# Patient Record
Sex: Male | Born: 1955 | Race: White | Hispanic: No | Marital: Married | Smoking: Current some day smoker
Health system: Southern US, Community
[De-identification: ages and names within clinical notes are randomized; demographics above are authoritative.]

## PROBLEM LIST (undated history)

## (undated) DIAGNOSIS — K759 Inflammatory liver disease, unspecified: Secondary | ICD-10-CM

## (undated) DIAGNOSIS — I10 Essential (primary) hypertension: Secondary | ICD-10-CM

## (undated) DIAGNOSIS — M1990 Unspecified osteoarthritis, unspecified site: Secondary | ICD-10-CM

## (undated) DIAGNOSIS — Z974 Presence of external hearing-aid: Secondary | ICD-10-CM

## (undated) HISTORY — PX: JOINT REPLACEMENT: SHX530

## (undated) HISTORY — PX: BRAIN SURGERY: SHX531

---
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
## 2012-06-16 DIAGNOSIS — IMO0002 Reserved for concepts with insufficient information to code with codable children: Secondary | ICD-10-CM | POA: Insufficient documentation

## 2012-06-16 DIAGNOSIS — H40003 Preglaucoma, unspecified, bilateral: Secondary | ICD-10-CM | POA: Insufficient documentation

## 2013-04-19 DIAGNOSIS — M1990 Unspecified osteoarthritis, unspecified site: Secondary | ICD-10-CM | POA: Insufficient documentation

## 2015-02-20 DIAGNOSIS — I609 Nontraumatic subarachnoid hemorrhage, unspecified: Secondary | ICD-10-CM

## 2015-02-20 HISTORY — DX: Nontraumatic subarachnoid hemorrhage, unspecified: I60.9

## 2017-05-22 ENCOUNTER — Encounter: Payer: Self-pay | Admitting: Family Medicine

## 2017-05-23 ENCOUNTER — Encounter: Payer: Self-pay | Admitting: Family Medicine

## 2017-05-23 ENCOUNTER — Ambulatory Visit (INDEPENDENT_AMBULATORY_CARE_PROVIDER_SITE_OTHER): Payer: BLUE CROSS/BLUE SHIELD | Admitting: Family Medicine

## 2017-05-23 ENCOUNTER — Ambulatory Visit (INDEPENDENT_AMBULATORY_CARE_PROVIDER_SITE_OTHER)
Admission: RE | Admit: 2017-05-23 | Discharge: 2017-05-23 | Disposition: A | Discharge status: Home / Self Care | Payer: BLUE CROSS/BLUE SHIELD | Source: Ambulatory Visit | Attending: Family Medicine | Admitting: Family Medicine

## 2017-05-23 VITALS — BP 140/80 | HR 68 | Ht 72.0 in | Wt 216.0 lb

## 2017-05-23 DIAGNOSIS — M545 Low back pain, unspecified: Secondary | ICD-10-CM

## 2017-05-23 DIAGNOSIS — M5441 Lumbago with sciatica, right side: Secondary | ICD-10-CM | POA: Diagnosis not present

## 2017-05-23 DIAGNOSIS — M5442 Lumbago with sciatica, left side: Secondary | ICD-10-CM

## 2017-05-23 DIAGNOSIS — G8929 Other chronic pain: Secondary | ICD-10-CM | POA: Diagnosis not present

## 2017-05-23 MED ORDER — GABAPENTIN 100 MG PO CAPS: 200.0000 mg | ORAL_CAPSULE | Freq: Every day | ORAL | 3 refills | Status: DC

## 2017-05-23 MED ORDER — VITAMIN D (ERGOCALCIFEROL) 1.25 MG (50000 UNIT) PO CAPS: 50000.0000 [IU] | ORAL_CAPSULE | ORAL | 0 refills | Status: DC

## 2017-05-23 NOTE — Patient Instructions (Addendum)
Good to see you  Ice is your friend Ice 20 minutes 2 times daily. Usually after activity and before bed. Exercises 3 times a week.  Gabapentin 200mg  at night  Once weekly vitamin D for 12 weeks  xrays downstairs Over the counter get  Turmeric 500mg  daily  Tart cherry extract any dose at night  See me again in 3-4 weeks

## 2017-05-23 NOTE — Progress Notes (Signed)
Tawana ScaleZach Mooney D.O. [facility] [address] [place], [place] [address] Phone: [phone] Subjective:    I'm seeing this patient by the request  of:    CC: Back pain  CZY:[id number]:Subjective  Charles Mooney is a 62 y.o. male coming in with complaint of back pain. Lower left and upper right are most painful. Lateral left leg has some numbness, tingling, and burning sensation.  Onset- Chronic  Location lower back Duration- Worse at night but is now consistent all day Character-more of an aching sensation Aggravating factors- Standing Reliving factors- Heat, Ice, stretches  Therapies tried-as stated above Severity-5 out of 10 but potentially worsening over the course of time     History reviewed. No pertinent past medical history. History reviewed. No pertinent surgical history. Social History   Socioeconomic History  . Marital status: Married    Spouse name: Not on file  . Number of children: Not on file  . Years of education: Not on file  . Highest education level: Not on file  Occupational History  . Not on file  Social Needs  . Financial resource strain: Not on file  . Food insecurity:    Worry: Not on file    Inability: Not on file  . Transportation needs:    Medical: Not on file    Non-medical: Not on file  Tobacco Use  . Smoking status: Former Smoker    Last attempt to quit: 02/06/2016    Years since quitting: 1.2  . Smokeless tobacco: Never Used  Substance and Sexual Activity  . Alcohol use: Not on file  . Drug use: Not on file  . Sexual activity: Not on file  Lifestyle  . Physical activity:    Days per week: Not on file    Minutes per session: Not on file  . Stress: Not on file  Relationships  . Social connections:    Talks on phone: Not on file    Gets together: Not on file    Attends religious service: Not on file    Active member of club or organization: Not on file    Attends meetings of clubs or organizations: Not on file    Relationship  status: Not on file  Other Topics Concern  . Not on file  Social History Narrative  . Not on file   Not on File History reviewed. No pertinent family history.   Past medical history, social, surgical and family history all reviewed in electronic medical record.  No pertanent information unless stated regarding to the chief complaint.   Review of Systems:Review of systems updated and as accurate as of 05/23/17  No headache, visual changes, nausea, vomiting, diarrhea, constipation, dizziness, abdominal pain, skin rash, fevers, chills, night sweats, weight loss, swollen lymph nodes, body aches, joint swelling, muscle aches, chest pain, shortness of breath, mood changes.   Objective  Blood pressure 140/80, pulse 68, height 6' (1.829 m), weight 216 lb (98 kg), SpO2 97 %. Systems examined below as of 05/23/17   General: No apparent distress alert and oriented x3 mood and affect normal, dressed appropriately.  HEENT: Pupils equal, extraocular movements intact  Respiratory: Patient's speak in full sentences and does not appear short of breath  Cardiovascular: No lower extremity edema, non tender, no erythema  Skin: Warm dry intact with no signs of infection or rash on extremities or on axial skeleton.  Abdomen: Soft nontender  Neuro: Cranial nerves II through XII are intact, neurovascularly intact in all extremities  with 2+ DTRs and 2+ pulses.  Lymph: No lymphadenopathy of posterior or anterior cervical chain or axillae bilaterally.  Gait normal with good balance and coordination.  MSK:  Non tender with full range of motion and good stability and symmetric strength and tone of shoulders, elbows, wrist, hip, knee and ankles bilaterally.  Back Exam:  Inspection: Loss of lordosis Motion: Flexion 45 deg, Extension 25 deg, Side Bending to 25 deg bilaterally,  Rotation to 25 deg bilaterally  SLR laying: Negative  XSLR laying: Negative  Palpable tenderness: Diffuse tenderness in the paraspinal  musculature lumbar spine right greater than left. FABER: Significant tightness bilaterally.. Sensory change: Gross sensation intact to all lumbar and sacral dermatomes.  Reflexes: 2+ at both patellar tendons, 2+ at achilles tendons, Babinski's downgoing.  Strength at foot  Plantar-flexion: 5/5 Dorsi-flexion: 5/5 Eversion: 5/5 Inversion: 5/5  Leg strength  Quad: 5/5 Hamstring: 5/5 Hip flexor: 5/5 Hip abductors: 5/5  Gait unremarkable.  97110; 15 additional minutes spent for Therapeutic exercises as stated in above notes.  This included exercises focusing on stretching, strengthening, with significant focus on eccentric aspects.   Long term goals include an improvement in range of motion, strength, endurance as well as avoiding reinjury. Patient's frequency would include in 1-2 times a day, 3-5 times a week for a duration of 6-12 weeks. Low back exercises that included:  Pelvic tilt/bracing instruction to focus on control of the pelvic girdle and lower abdominal muscles  Glute strengthening exercises, focusing on proper firing of the glutes without engaging the low back muscles Proper stretching techniques for maximum relief for the hamstrings, hip flexors, low back and some rotation where tolerated    Proper technique shown and discussed handout in great detail with ATC.  All questions were discussed and answered.       Impression and Recommendations:     This case required medical decision making of moderate complexity.      Note: This dictation was prepared with Dragon dictation along with smaller phrase technology. Any transcriptional errors that result from this process are unintentional.

## 2017-05-23 NOTE — Assessment & Plan Note (Signed)
Patient does have some low back pain.  Sometimes has some radicular pain that is also consistent with some weakness.  X-rays ordered today.  Gabapentin given.  Concern for potential spinal stenosis.  Has seen other providers previously for this.  Also started once weekly vitamin D.  Continues to have pain consider formal physical therapy, manipulation, or possible labs.  Patient will follow-up again in 4 weeks

## 2017-06-14 ENCOUNTER — Ambulatory Visit: Payer: Self-pay

## 2017-06-14 ENCOUNTER — Ambulatory Visit (INDEPENDENT_AMBULATORY_CARE_PROVIDER_SITE_OTHER): Payer: BLUE CROSS/BLUE SHIELD | Admitting: Family Medicine

## 2017-06-14 ENCOUNTER — Encounter: Payer: Self-pay | Admitting: Family Medicine

## 2017-06-14 VITALS — BP 126/90 | HR 58 | Ht 72.0 in | Wt 214.0 lb

## 2017-06-14 DIAGNOSIS — M999 Biomechanical lesion, unspecified: Secondary | ICD-10-CM | POA: Insufficient documentation

## 2017-06-14 DIAGNOSIS — M5441 Lumbago with sciatica, right side: Secondary | ICD-10-CM | POA: Diagnosis not present

## 2017-06-14 DIAGNOSIS — M7551 Bursitis of right shoulder: Secondary | ICD-10-CM | POA: Diagnosis not present

## 2017-06-14 DIAGNOSIS — M25511 Pain in right shoulder: Secondary | ICD-10-CM

## 2017-06-14 DIAGNOSIS — M5442 Lumbago with sciatica, left side: Secondary | ICD-10-CM

## 2017-06-14 DIAGNOSIS — G8929 Other chronic pain: Secondary | ICD-10-CM

## 2017-06-14 NOTE — Assessment & Plan Note (Signed)
Stable.  Mild osteoarthritic changes.  Discussed with patient at great length about icing regimen and home exercises.  Patient will try to increase activity slowly over the course the next several days.  Patient will follow-up with me again in 4 to 6 weeks for further evaluation and treatment.

## 2017-06-14 NOTE — Patient Instructions (Addendum)
Good to see you  Ice 20 minutes 2 times daily. Usually after activity and before bed. Exercises 3 times a week.  pennsaid pinkie amount topically 2 times daily as needed.  Keep hands within peripheral vision  The back has mild arthritis but does seem to be making progress.  See me again in 4 weeks

## 2017-06-14 NOTE — Progress Notes (Signed)
[facility] [address] [place], [place] [address] Phone: [phone] Subjective:    I'm seeing this patient by the request  of:    CC: Back pain and shoulder pain follow-up  BJY:[id number]  Charles Mooney is a 62 y.o. male coming in with complaint of  Back pain.  Patient did have x-rays.  These were independently visualized by me.  Patient was found to have very mild loss arthritic changes.  Continues to have discomfort and pain now.  States that the exercises have been helpful.  Feels that if somebody could just potentially aligned him it seems to be doing better.  Patient is continuing to work very hard.  Patient is in the process of building a house.  Patient also has a new problem.  Right shoulder pain.  Describes the pain as a dull, throbbing aching pain.  Seems to radiate down his arm some.  Denies any weakness though.  Does wake him up at night.  Rates the severity of pain is 7 out of 10     No past medical history on file. No past surgical history on file. Social History   Socioeconomic History  . Marital status: Married    Spouse name: Not on file  . Number of children: Not on file  . Years of education: Not on file  . Highest education level: Not on file  Occupational History  . Not on file  Social Needs  . Financial resource strain: Not on file  . Food insecurity:    Worry: Not on file    Inability: Not on file  . Transportation needs:    Medical: Not on file    Non-medical: Not on file  Tobacco Use  . Smoking status: Former Smoker    Last attempt to quit: 02/06/2016    Years since quitting: 1.3  . Smokeless tobacco: Never Used  Substance and Sexual Activity  . Alcohol use: Not on file  . Drug use: Not on file  . Sexual activity: Not on file  Lifestyle  . Physical activity:    Days per week: Not on file    Minutes per session: Not on file  . Stress: Not on file  Relationships  . Social connections:    Talks on phone:  Not on file    Gets together: Not on file    Attends religious service: Not on file    Active member of club or organization: Not on file    Attends meetings of clubs or organizations: Not on file    Relationship status: Not on file  Other Topics Concern  . Not on file  Social History Narrative  . Not on file   Not on File No family history on file.   Past medical history, social, surgical and family history all reviewed in electronic medical record.  No pertanent information unless stated regarding to the chief complaint.   Review of Systems:Review of systems updated and as accurate as of 06/14/17  No headache, visual changes, nausea, vomiting, diarrhea, constipation, dizziness, abdominal pain, skin rash, fevers, chills, night sweats, weight loss, swollen lymph nodes, body aches, joint swelling, muscle aches, chest pain, shortness of breath, mood changes.   Objective  Blood pressure 126/90, pulse (!) 58, height 6' (1.829 m), weight 214 lb (97.1 kg), SpO2 98 %. Systems examined below as of 06/14/17   General: No apparent distress alert and oriented x3 mood and affect normal, dressed appropriately.  HEENT: Pupils  equal, extraocular movements intact  Respiratory: Patient's speak in full sentences and does not appear short of breath  Cardiovascular: No lower extremity edema, non tender, no erythema  Skin: Warm dry intact with no signs of infection or rash on extremities or on axial skeleton.  Abdomen: Soft nontender  Neuro: Cranial nerves II through XII are intact, neurovascularly intact in all extremities with 2+ DTRs and 2+ pulses.  Lymph: No lymphadenopathy of posterior or anterior cervical chain or axillae bilaterally.  Gait normal with good balance and coordination.  MSK:  Non tender with full range of motion and good stability and symmetric strength and tone of  elbows, wrist, hip, knee and ankles bilaterally.    Shoulder: Right Inspection reveals no abnormalities, atrophy or  asymmetry. Palpation is normal with no tenderness over AC joint or bicipital groove. ROM is full in all planes passively. Rotator cuff strength normal throughout. signs of impingement with positive Neer and Hawkin's tests, but negative empty can sign. Speeds and Yergason's tests normal. No labral pathology noted with negative Obrien's, negative clunk and good stability. Normal scapular function observed. No painful arc and no drop arm sign. No apprehension sign  MSK US performed of: Right This study was ordered, performed, and interpreted by Terrilee Files D.O.  Shoulder:   Supraspinatus:  Appears normal on long and transverse views, Bursal bulge seen with shoulder abduction on impingement view. Infraspinatus:  Appears normal on long and transverse views. Significant increase in Doppler flow Subscapularis:  Appears normal on long and transverse views. Positive bursa Teres Minor:  Appears normal on long and transverse views. AC joint:  Capsule undistended, no geyser sign. Glenohumeral Joint:  Appears normal without effusion. Glenoid Labrum:  Intact without visualized tears. Biceps Tendon: Significant hypoechoic changes within the tendon sheath. Impression: Subacromial bursitis  Procedure: Real-time Ultrasound Guided Injection of right glenohumeral joint Device: GE Logiq E  Ultrasound guided injection is preferred based studies that show increased duration, increased effect, greater accuracy, decreased procedural pain, increased response rate with ultrasound guided versus blind injection.  Verbal informed consent obtained.  Time-out conducted.  Noted no overlying erythema, induration, or other signs of local infection.  Skin prepped in a sterile fashion.  Local anesthesia: Topical Ethyl chloride.  With sterile technique and under real time ultrasound guidance:  Joint visualized.  23g 1  inch needle inserted posterior approach. Pictures taken for needle placement. Patient did have  injection of 2 cc of 1% lidocaine, 2 cc of 0.5% Marcaine, and 1.0 cc of Kenalog 40 mg/dL. Completed without difficulty  Pain immediately resolved suggesting accurate placement of the medication.  Advised to call if fevers/chills, erythema, induration, drainage, or persistent bleeding.  Images permanently stored and available for review in the ultrasound unit.  Impression: Technically successful ultrasound guided injection.   Back exam still shows significant tightness mostly in the thoracolumbar juncture on the left side.  Patient also has some of the right side.  Patient does have a positive Pearlean Brownie on the right side.  Negative straight leg test bilaterally.  Mild increase in discomfort with extension.  Osteopathic findings T3 extended rotated and side bent right inhaled third rib T9 extended rotated and side bent left L2 flexed rotated and side bent right Sacrum right on right  97110; 15 minutes spent for Therapeutic exercises as stated in above notes.  This included exercises focusing on stretching, strengthening, with significant focus on eccentric aspects.  Shoulder Exercises that included:  Basic scapular stabilization to include adduction and depression  of scapula Scaption, focusing on proper movement and good control Internal and External rotation utilizing a theraband, with elbow tucked at side entire time Rows with theraband given   Proper technique shown and discussed handout in great detail with ATC.  All questions were discussed and answered.     Impression and Recommendations:     This case required medical decision making of moderate complexity.      Note: This dictation was prepared with Dragon dictation along with smaller phrase technology. Any transcriptional errors that result from this process are unintentional.

## 2017-06-14 NOTE — Assessment & Plan Note (Signed)
Decision today to treat with OMT was based on Physical Exam  After verbal consent patient was treated with HVLA, ME, FPR techniques in  thoracic, lumbar and sacral areas  Patient tolerated the procedure well with improvement in symptoms  Patient given exercises, stretches and lifestyle modifications  See medications in patient instructions if given  Patient will follow up in 4-6 weeks 

## 2017-06-14 NOTE — Assessment & Plan Note (Signed)
Patient given an injection today.  Overuse injury.  Home exercises given.  Work with Event organiserathletic trainer.  Discussed icing regimen.  Topical anti-inflammatories given.  Avoid oral anti-inflammatories secondary to heart.  Patient will come back and see me again 4 to 6 weeks

## 2017-06-18 DIAGNOSIS — R609 Edema, unspecified: Secondary | ICD-10-CM | POA: Insufficient documentation

## 2017-06-18 DIAGNOSIS — M25559 Pain in unspecified hip: Secondary | ICD-10-CM | POA: Insufficient documentation

## 2017-06-18 DIAGNOSIS — M722 Plantar fascial fibromatosis: Secondary | ICD-10-CM | POA: Insufficient documentation

## 2017-06-18 DIAGNOSIS — H40039 Anatomical narrow angle, unspecified eye: Secondary | ICD-10-CM | POA: Insufficient documentation

## 2017-06-18 DIAGNOSIS — M6281 Muscle weakness (generalized): Secondary | ICD-10-CM | POA: Insufficient documentation

## 2017-06-18 DIAGNOSIS — M23329 Other meniscus derangements, posterior horn of medial meniscus, unspecified knee: Secondary | ICD-10-CM | POA: Insufficient documentation

## 2017-06-18 DIAGNOSIS — R269 Unspecified abnormalities of gait and mobility: Secondary | ICD-10-CM | POA: Insufficient documentation

## 2017-06-18 DIAGNOSIS — M1991 Primary osteoarthritis, unspecified site: Secondary | ICD-10-CM | POA: Insufficient documentation

## 2017-06-18 DIAGNOSIS — Z96659 Presence of unspecified artificial knee joint: Secondary | ICD-10-CM | POA: Insufficient documentation

## 2017-09-13 ENCOUNTER — Other Ambulatory Visit: Payer: Self-pay | Admitting: Student

## 2017-09-13 DIAGNOSIS — I606 Nontraumatic subarachnoid hemorrhage from other intracranial arteries: Secondary | ICD-10-CM

## 2017-09-17 ENCOUNTER — Other Ambulatory Visit: Payer: Self-pay | Admitting: Student

## 2017-09-17 DIAGNOSIS — I606 Nontraumatic subarachnoid hemorrhage from other intracranial arteries: Secondary | ICD-10-CM

## 2017-09-18 ENCOUNTER — Other Ambulatory Visit: Payer: Self-pay | Admitting: Student

## 2017-09-18 DIAGNOSIS — I606 Nontraumatic subarachnoid hemorrhage from other intracranial arteries: Secondary | ICD-10-CM

## 2017-10-05 ENCOUNTER — Ambulatory Visit
Admission: RE | Admit: 2017-10-05 | Discharge: 2017-10-05 | Disposition: A | Discharge status: Home / Self Care | Payer: BLUE CROSS/BLUE SHIELD | Source: Ambulatory Visit | Attending: Student | Admitting: Student

## 2017-10-05 DIAGNOSIS — Z87898 Personal history of other specified conditions: Secondary | ICD-10-CM | POA: Insufficient documentation

## 2017-10-05 DIAGNOSIS — I606 Nontraumatic subarachnoid hemorrhage from other intracranial arteries: Secondary | ICD-10-CM

## 2017-10-05 DIAGNOSIS — Z09 Encounter for follow-up examination after completed treatment for conditions other than malignant neoplasm: Secondary | ICD-10-CM | POA: Diagnosis present

## 2017-11-06 ENCOUNTER — Other Ambulatory Visit: Payer: Self-pay | Admitting: Orthopedic Surgery

## 2017-11-06 DIAGNOSIS — M25511 Pain in right shoulder: Secondary | ICD-10-CM

## 2017-11-21 ENCOUNTER — Ambulatory Visit
Admission: RE | Admit: 2017-11-21 | Discharge: 2017-11-21 | Disposition: A | Discharge status: Home / Self Care | Payer: BLUE CROSS/BLUE SHIELD | Source: Ambulatory Visit | Attending: Orthopedic Surgery | Admitting: Orthopedic Surgery

## 2017-11-21 DIAGNOSIS — M25511 Pain in right shoulder: Secondary | ICD-10-CM

## 2018-06-03 DIAGNOSIS — M25512 Pain in left shoulder: Secondary | ICD-10-CM | POA: Insufficient documentation

## 2018-07-29 ENCOUNTER — Other Ambulatory Visit: Payer: Self-pay | Admitting: Orthopedic Surgery

## 2018-07-29 DIAGNOSIS — M25512 Pain in left shoulder: Secondary | ICD-10-CM

## 2018-08-16 ENCOUNTER — Other Ambulatory Visit: Payer: BLUE CROSS/BLUE SHIELD

## 2018-09-04 ENCOUNTER — Encounter
Admission: RE | Admit: 2018-09-04 | Discharge: 2018-09-04 | Disposition: A | Discharge status: Home / Self Care | Payer: Medicare Other | Source: Ambulatory Visit | Attending: Internal Medicine | Admitting: Internal Medicine

## 2018-09-04 ENCOUNTER — Other Ambulatory Visit: Payer: Self-pay

## 2018-09-04 DIAGNOSIS — Z1159 Encounter for screening for other viral diseases: Secondary | ICD-10-CM | POA: Diagnosis not present

## 2018-09-04 DIAGNOSIS — Z8673 Personal history of transient ischemic attack (TIA), and cerebral infarction without residual deficits: Secondary | ICD-10-CM | POA: Diagnosis not present

## 2018-09-04 DIAGNOSIS — M7582 Other shoulder lesions, left shoulder: Secondary | ICD-10-CM | POA: Diagnosis not present

## 2018-09-04 DIAGNOSIS — Z8619 Personal history of other infectious and parasitic diseases: Secondary | ICD-10-CM | POA: Diagnosis not present

## 2018-09-04 DIAGNOSIS — Z01812 Encounter for preprocedural laboratory examination: Secondary | ICD-10-CM | POA: Insufficient documentation

## 2018-09-04 DIAGNOSIS — Z882 Allergy status to sulfonamides status: Secondary | ICD-10-CM | POA: Diagnosis not present

## 2018-09-04 DIAGNOSIS — M75112 Incomplete rotator cuff tear or rupture of left shoulder, not specified as traumatic: Secondary | ICD-10-CM | POA: Diagnosis not present

## 2018-09-04 DIAGNOSIS — M7542 Impingement syndrome of left shoulder: Secondary | ICD-10-CM | POA: Diagnosis not present

## 2018-09-04 DIAGNOSIS — Z87891 Personal history of nicotine dependence: Secondary | ICD-10-CM | POA: Diagnosis not present

## 2018-09-04 DIAGNOSIS — M19012 Primary osteoarthritis, left shoulder: Secondary | ICD-10-CM | POA: Diagnosis not present

## 2018-09-04 DIAGNOSIS — Z01810 Encounter for preprocedural cardiovascular examination: Secondary | ICD-10-CM

## 2018-09-04 HISTORY — DX: Inflammatory liver disease, unspecified: K75.9

## 2018-09-04 LAB — COMPREHENSIVE METABOLIC PANEL
ALT: 20 U/L (ref 0–44)
AST: 18 U/L (ref 15–41)
Albumin: 4.4 g/dL (ref 3.5–5.0)
Alkaline Phosphatase: 39 U/L (ref 38–126)
Anion gap: 9 (ref 5–15)
BUN: 18 mg/dL (ref 8–23)
CO2: 24 mmol/L (ref 22–32)
Calcium: 9.2 mg/dL (ref 8.9–10.3)
Chloride: 105 mmol/L (ref 98–111)
Creatinine, Ser: 0.81 mg/dL (ref 0.61–1.24)
GFR calc Af Amer: >60 mL/min (ref >60)
GFR calc non Af Amer: >60 mL/min (ref >60)
Glucose, Bld: 84 mg/dL (ref 70–99)
Potassium: 3.9 mmol/L (ref 3.5–5.1)
Sodium: 138 mmol/L (ref 135–145)
Total Bilirubin: 1.4 mg/dL — ABNORMAL HIGH (ref 0.3–1.2)
Total Protein: 7.1 g/dL (ref 6.5–8.1)

## 2018-09-04 LAB — CBC
HCT: 43.8 % (ref 39.0–52.0)
Hemoglobin: 14.9 g/dL (ref 13.0–17.0)
MCH: 30 pg (ref 26.0–34.0)
MCHC: 34 g/dL (ref 30.0–36.0)
MCV: 88.1 fL (ref 80.0–100.0)
Platelets: 213 10*3/uL (ref 150–400)
RBC: 4.97 MIL/uL (ref 4.22–5.81)
RDW: 12.4 % (ref 11.5–15.5)
WBC: 5.5 10*3/uL (ref 4.0–10.5)
nRBC: 0 % (ref 0.0–0.2)

## 2018-09-04 NOTE — Patient Instructions (Signed)
Your procedure is scheduled on: 09/08/18 Report to Day Surgery. [facility]LO[facility] To find out your arrival time please call [phone] between 1PM - 3PM on 09/05/18.  Remember: Instructions that are not followed completely may result in serious medical risk,  up to and including death, or upon the discretion of your surgeon and anesthesiologist your  surgery may need to be rescheduled.     _X__ 1. Do not eat food after midnight the night before your procedure.                 No gum chewing or hard candies. You may drink clear liquids up to 2 hours                 before you are scheduled to arrive for your surgery- DO not drink clear                 liquids within 2 hours of the start of your surgery.                 Clear Liquids include:  water, apple juice without pulp, clear carbohydrate                 drink such as Clearfast of Gatorade, Black Coffee or Tea (Do not add                 anything to coffee or tea).  __X__2.  On the morning of surgery brush your teeth with toothpaste and water, you                may rinse your mouth with mouthwash if you wish.  Do not swallow any toothpaste of mouthwash.     _X__ 3.  No Alcohol for 24 hours before or after surgery.   _X__ 4.  Do Not Smoke or use e-cigarettes For 24 Hours Prior to Your Surgery.                 Do not use any chewable tobacco products for at least 6 hours prior to                 surgery.  ____  5.  Bring all medications with you on the day of surgery if instructed.   __X__  6.  Notify your doctor if there is any change in your medical condition      (cold, fever, infections).     Do not wear jewelry, make-up, hairpins, clips or nail polish. Do not wear lotions, powders, or perfumes. You may wear deodorant. Do not shave 48 hours prior to surgery. Men may shave face and neck. Do not bring valuables to the hospital.    [facility] is not responsible for any belongings or  valuables.  Contacts, dentures or bridgework may not be worn into surgery. Leave your suitcase in the car. After surgery it may be brought to your room. For patients admitted to the hospital, discharge time is determined by your treatment team.   Patients discharged the day of surgery will not be allowed to drive home.   Please read over the following fact sheets that you were given:   Surgical Site Infection Prevention          ____ Take these medicines the morning of surgery with A SIP OF WATER:    1. NONE  2.   3.   4.  5.  6.  ____ Fleet Enema (as directed)  _X___ Use CHG Soap as directed  ____ Use inhalers on the day of surgery  ____ Stop metformin 2 days prior to surgery    ____ Take 1/2 of usual insulin dose the night before surgery. No insulin the morning          of surgery.   ____ Stop Coumadin/Plavix/aspirin on   __X__ Stop Anti-inflammatories on  09/05/18   _X___ Stop supplements until after surgery.  STOP TUMERIC 09/05/18  ____ Bring C-Pap to the hospital.

## 2018-09-04 NOTE — Pre-Procedure Instructions (Signed)
ekg did not transmit. Faxed to cardiopulmonary to be manually read

## 2018-09-05 LAB — SARS CORONAVIRUS 2 (TAT 6-24 HRS): SARS Coronavirus 2: NEGATIVE

## 2018-09-07 MED ORDER — CEFAZOLIN SODIUM-DEXTROSE 2-4 GM/100ML-% IV SOLN
2.0000 g | Freq: Once | INTRAVENOUS | Status: AC
  Administered 2018-09-08: 08:00:00 2 g via INTRAVENOUS

## 2018-09-08 ENCOUNTER — Ambulatory Visit: Payer: Medicare Other | Admitting: Anesthesiology

## 2018-09-08 ENCOUNTER — Encounter
Admission: RE | Disposition: A | Discharge status: Home / Self Care | Payer: Self-pay | Source: Home / Self Care | Attending: Orthopedic Surgery

## 2018-09-08 ENCOUNTER — Ambulatory Visit
Admission: RE | Admit: 2018-09-08 | Discharge: 2018-09-08 | Disposition: A | Discharge status: Home / Self Care | Payer: Medicare Other | Attending: Orthopedic Surgery | Admitting: Orthopedic Surgery

## 2018-09-08 ENCOUNTER — Encounter: Payer: Self-pay | Admitting: Anesthesiology

## 2018-09-08 DIAGNOSIS — Z87891 Personal history of nicotine dependence: Secondary | ICD-10-CM | POA: Insufficient documentation

## 2018-09-08 DIAGNOSIS — Z8673 Personal history of transient ischemic attack (TIA), and cerebral infarction without residual deficits: Secondary | ICD-10-CM | POA: Insufficient documentation

## 2018-09-08 DIAGNOSIS — M19012 Primary osteoarthritis, left shoulder: Secondary | ICD-10-CM | POA: Insufficient documentation

## 2018-09-08 DIAGNOSIS — M75112 Incomplete rotator cuff tear or rupture of left shoulder, not specified as traumatic: Secondary | ICD-10-CM | POA: Insufficient documentation

## 2018-09-08 DIAGNOSIS — M7582 Other shoulder lesions, left shoulder: Secondary | ICD-10-CM | POA: Insufficient documentation

## 2018-09-08 DIAGNOSIS — M7542 Impingement syndrome of left shoulder: Secondary | ICD-10-CM | POA: Diagnosis not present

## 2018-09-08 DIAGNOSIS — Z8619 Personal history of other infectious and parasitic diseases: Secondary | ICD-10-CM | POA: Insufficient documentation

## 2018-09-08 DIAGNOSIS — Z882 Allergy status to sulfonamides status: Secondary | ICD-10-CM | POA: Insufficient documentation

## 2018-09-08 HISTORY — PX: SHOULDER ARTHROSCOPY WITH ROTATOR CUFF REPAIR AND OPEN BICEPS TENODESIS: SHX6677

## 2018-09-08 SURGERY — SHOULDER ARTHROSCOPY WITH ROTATOR CUFF REPAIR AND OPEN BICEPS TENODESIS
Anesthesia: General | Laterality: Left

## 2018-09-08 MED ORDER — BUPIVACAINE-EPINEPHRINE (PF) 0.5% -1:200000 IJ SOLN
INTRAMUSCULAR | Status: DC | PRN
  Administered 2018-09-08: 10 mL

## 2018-09-08 MED ORDER — LIDOCAINE HCL (PF) 1 % IJ SOLN
INTRAMUSCULAR | Status: AC
  Filled 2018-09-08: qty 30

## 2018-09-08 MED ORDER — OXYCODONE HCL 5 MG PO TABS: 5.0000 mg | ORAL_TABLET | ORAL | 0 refills | Status: DC | PRN

## 2018-09-08 MED ORDER — NEOMYCIN-POLYMYXIN B GU 40-200000 IR SOLN
Status: DC | PRN
  Administered 2018-09-08: 2 mL

## 2018-09-08 MED ORDER — EPHEDRINE SULFATE 50 MG/ML IJ SOLN
INTRAMUSCULAR | Status: AC
  Filled 2018-09-08: qty 1

## 2018-09-08 MED ORDER — EPINEPHRINE (ANAPHYLAXIS) 30 MG/30ML IJ SOLN
INTRAMUSCULAR | Status: AC
  Filled 2018-09-08: qty 30

## 2018-09-08 MED ORDER — LACTATED RINGERS IV SOLN
INTRAVENOUS | Status: DC | PRN
  Administered 2018-09-08: 9 mL

## 2018-09-08 MED ORDER — ACETAMINOPHEN 500 MG PO TABS: 1000.0000 mg | ORAL_TABLET | Freq: Three times a day (TID) | ORAL | 2 refills | Status: DC

## 2018-09-08 MED ORDER — SUGAMMADEX SODIUM 500 MG/5ML IV SOLN
INTRAVENOUS | Status: AC
  Filled 2018-09-08: qty 5

## 2018-09-08 MED ORDER — EPHEDRINE SULFATE 50 MG/ML IJ SOLN
INTRAMUSCULAR | Status: DC | PRN
  Administered 2018-09-08: 10 mg via INTRAVENOUS

## 2018-09-08 MED ORDER — MIDAZOLAM HCL 2 MG/2ML IJ SOLN
INTRAMUSCULAR | Status: AC
  Administered 2018-09-08: 08:00:00 1 mg via INTRAVENOUS
  Filled 2018-09-08: qty 2

## 2018-09-08 MED ORDER — SUCCINYLCHOLINE CHLORIDE 20 MG/ML IJ SOLN
INTRAMUSCULAR | Status: DC | PRN
  Administered 2018-09-08: 120 mg via INTRAVENOUS

## 2018-09-08 MED ORDER — MIDAZOLAM HCL 2 MG/2ML IJ SOLN
INTRAMUSCULAR | Status: DC | PRN
  Administered 2018-09-08 (×2): 1 mg via INTRAVENOUS

## 2018-09-08 MED ORDER — PROPOFOL 10 MG/ML IV BOLUS
INTRAVENOUS | Status: DC | PRN
  Administered 2018-09-08: 60 mg via INTRAVENOUS

## 2018-09-08 MED ORDER — BUPIVACAINE LIPOSOME 1.3 % IJ SUSP
INTRAMUSCULAR | Status: AC
  Filled 2018-09-08: qty 20

## 2018-09-08 MED ORDER — FENTANYL CITRATE (PF) 100 MCG/2ML IJ SOLN
50.0000 ug | Freq: Once | INTRAMUSCULAR | Status: AC
  Administered 2018-09-08: 50 ug via INTRAVENOUS

## 2018-09-08 MED ORDER — ACETAMINOPHEN 10 MG/ML IV SOLN
INTRAVENOUS | Status: AC
  Filled 2018-09-08: qty 100

## 2018-09-08 MED ORDER — PHENYLEPHRINE HCL (PRESSORS) 10 MG/ML IV SOLN
INTRAVENOUS | Status: DC | PRN
  Administered 2018-09-08 (×2): 100 ug via INTRAVENOUS
  Administered 2018-09-08: 50 ug via INTRAVENOUS
  Administered 2018-09-08: 100 ug via INTRAVENOUS

## 2018-09-08 MED ORDER — LIDOCAINE HCL (PF) 1 % IJ SOLN
INTRAMUSCULAR | Status: AC
  Filled 2018-09-08: qty 5

## 2018-09-08 MED ORDER — LIDOCAINE HCL (PF) 1 % IJ SOLN
INTRAMUSCULAR | Status: DC | PRN
  Administered 2018-09-08: 5 mL via SUBCUTANEOUS

## 2018-09-08 MED ORDER — FENTANYL CITRATE (PF) 100 MCG/2ML IJ SOLN
INTRAMUSCULAR | Status: DC | PRN
  Administered 2018-09-08 (×2): 25 ug via INTRAVENOUS
  Administered 2018-09-08: 50 ug via INTRAVENOUS

## 2018-09-08 MED ORDER — FAMOTIDINE 20 MG PO TABS
ORAL_TABLET | ORAL | Status: AC
  Administered 2018-09-08: 20 mg via ORAL
  Filled 2018-09-08: qty 1

## 2018-09-08 MED ORDER — ROCURONIUM BROMIDE 100 MG/10ML IV SOLN
INTRAVENOUS | Status: DC | PRN
  Administered 2018-09-08: 10 mg via INTRAVENOUS
  Administered 2018-09-08: 35 mg via INTRAVENOUS
  Administered 2018-09-08: 10 mg via INTRAVENOUS
  Administered 2018-09-08: 5 mg via INTRAVENOUS
  Administered 2018-09-08: 10 mg via INTRAVENOUS

## 2018-09-08 MED ORDER — FAMOTIDINE 20 MG PO TABS
20.0000 mg | ORAL_TABLET | Freq: Once | ORAL | Status: AC
  Administered 2018-09-08: 07:00:00 20 mg via ORAL

## 2018-09-08 MED ORDER — MIDAZOLAM HCL 2 MG/2ML IJ SOLN
1.0000 mg | Freq: Once | INTRAMUSCULAR | Status: AC
  Administered 2018-09-08: 08:00:00 1 mg via INTRAVENOUS

## 2018-09-08 MED ORDER — LACTATED RINGERS IV SOLN
INTRAVENOUS | Status: DC
  Administered 2018-09-08: 07:00:00 via INTRAVENOUS

## 2018-09-08 MED ORDER — FENTANYL CITRATE (PF) 100 MCG/2ML IJ SOLN
INTRAMUSCULAR | Status: AC
  Administered 2018-09-08: 08:00:00 50 ug via INTRAVENOUS
  Filled 2018-09-08: qty 2

## 2018-09-08 MED ORDER — BUPIVACAINE-EPINEPHRINE (PF) 0.5% -1:200000 IJ SOLN
INTRAMUSCULAR | Status: AC
  Filled 2018-09-08: qty 30

## 2018-09-08 MED ORDER — LACTATED RINGERS IV SOLN
INTRAVENOUS | Status: DC | PRN
  Administered 2018-09-08: 08:00:00 via INTRAVENOUS

## 2018-09-08 MED ORDER — OXYCODONE HCL 5 MG PO TABS
5.0000 mg | ORAL_TABLET | ORAL | Status: DC | PRN
  Administered 2018-09-08: 12:00:00 5 mg via ORAL

## 2018-09-08 MED ORDER — NEOMYCIN-POLYMYXIN B GU 40-200000 IR SOLN
Status: AC
  Filled 2018-09-08: qty 2

## 2018-09-08 MED ORDER — BUPIVACAINE HCL (PF) 0.5 % IJ SOLN
INTRAMUSCULAR | Status: DC | PRN
  Administered 2018-09-08: 10 mL via PERINEURAL

## 2018-09-08 MED ORDER — ONDANSETRON 4 MG PO TBDP: 4.0000 mg | ORAL_TABLET | Freq: Three times a day (TID) | ORAL | 0 refills | Status: DC | PRN

## 2018-09-08 MED ORDER — ASPIRIN EC 325 MG PO TBEC: 325.0000 mg | DELAYED_RELEASE_TABLET | Freq: Every day | ORAL | 0 refills | Status: AC

## 2018-09-08 MED ORDER — PROPOFOL 10 MG/ML IV BOLUS
INTRAVENOUS | Status: AC
  Filled 2018-09-08: qty 20

## 2018-09-08 MED ORDER — OXYCODONE HCL 5 MG PO TABS
ORAL_TABLET | ORAL | Status: AC
  Filled 2018-09-08: qty 1

## 2018-09-08 MED ORDER — SEVOFLURANE IN SOLN
RESPIRATORY_TRACT | Status: AC
  Filled 2018-09-08: qty 250

## 2018-09-08 MED ORDER — BUPIVACAINE LIPOSOME 1.3 % IJ SUSP
INTRAMUSCULAR | Status: DC | PRN
  Administered 2018-09-08: 20 mL via PERINEURAL

## 2018-09-08 MED ORDER — FENTANYL CITRATE (PF) 100 MCG/2ML IJ SOLN: 25.0000 ug | INTRAMUSCULAR | Status: DC | PRN

## 2018-09-08 MED ORDER — CEFAZOLIN SODIUM-DEXTROSE 2-4 GM/100ML-% IV SOLN
INTRAVENOUS | Status: AC
  Filled 2018-09-08: qty 100

## 2018-09-08 MED ORDER — PROMETHAZINE HCL 25 MG/ML IJ SOLN: 6.2500 mg | INTRAMUSCULAR | Status: DC | PRN

## 2018-09-08 MED ORDER — FENTANYL CITRATE (PF) 100 MCG/2ML IJ SOLN
INTRAMUSCULAR | Status: AC
  Filled 2018-09-08: qty 2

## 2018-09-08 MED ORDER — ACETAMINOPHEN 10 MG/ML IV SOLN
INTRAVENOUS | Status: DC | PRN
  Administered 2018-09-08: 1000 mg via INTRAVENOUS

## 2018-09-08 MED ORDER — SODIUM CHLORIDE 0.9 % IV SOLN
INTRAVENOUS | Status: DC | PRN
  Administered 2018-09-08: 20 ug/min via INTRAVENOUS

## 2018-09-08 MED ORDER — MIDAZOLAM HCL 2 MG/2ML IJ SOLN
INTRAMUSCULAR | Status: AC
  Filled 2018-09-08: qty 2

## 2018-09-08 MED ORDER — PHENYLEPHRINE HCL (PRESSORS) 10 MG/ML IV SOLN
INTRAVENOUS | Status: AC
  Filled 2018-09-08: qty 1

## 2018-09-08 MED ORDER — LIDOCAINE HCL (CARDIAC) PF 100 MG/5ML IV SOSY
PREFILLED_SYRINGE | INTRAVENOUS | Status: DC | PRN
  Administered 2018-09-08: 60 mg via INTRAVENOUS

## 2018-09-08 MED ORDER — SUGAMMADEX SODIUM 500 MG/5ML IV SOLN
INTRAVENOUS | Status: DC | PRN
  Administered 2018-09-08: 210 mg via INTRAVENOUS

## 2018-09-08 MED ORDER — BUPIVACAINE HCL (PF) 0.5 % IJ SOLN
INTRAMUSCULAR | Status: AC
  Filled 2018-09-08: qty 10

## 2018-09-08 SURGICAL SUPPLY — 83 items
ADAPTER IRRIG TUBE 2 SPIKE SOL (ADAPTER) ×4 IMPLANT
ANCHOR 3.9 PEEK 3 CORKSCREW (Anchor) ×1 IMPLANT
ANCHOR BONE REGENETEN (Anchor) ×1 IMPLANT
ANCHOR SUT FBRTK SUTURETAP 1.3 (Anchor) ×1 IMPLANT
ANCHOR TENDON REGENETEN (Staple) ×1 IMPLANT
BIT DRILL RIGD1.8MM FBRTK STRL (DRILL) IMPLANT
BRUSH SCRUB EZ  4% CHG (MISCELLANEOUS)
BRUSH SCRUB EZ 4% CHG (MISCELLANEOUS) ×1 IMPLANT
BUR BR 5.5 12 FLUTE (BURR) ×1 IMPLANT
BUR RADIUS 4.0X18.5 (BURR) ×1 IMPLANT
BUR RADIUS 5.5 (BURR) ×2 IMPLANT
CANNULA PART THRD DISP 5.75X7 (CANNULA) ×4 IMPLANT
CANNULA PARTIAL THREAD 2X7 (CANNULA) ×3 IMPLANT
CANNULA TWIST IN 8.25X9CM (CANNULA) IMPLANT
CHLORAPREP W/TINT 26 (MISCELLANEOUS) ×2 IMPLANT
COOLER POLAR GLACIER W/PUMP (MISCELLANEOUS) ×2 IMPLANT
COVER WAND RF STERILE (DRAPES) ×2 IMPLANT
CRADLE LAMINECT ARM (MISCELLANEOUS) ×2 IMPLANT
DERMABOND ADVANCED (GAUZE/BANDAGES/DRESSINGS) ×1
DERMABOND ADVANCED .7 DNX12 (GAUZE/BANDAGES/DRESSINGS) IMPLANT
DRAPE 3/4 80X56 (DRAPES) ×2 IMPLANT
DRAPE INCISE IOBAN 66X45 STRL (DRAPES) ×2 IMPLANT
DRAPE SPLIT 6X30 W/TAPE (DRAPES) ×4 IMPLANT
DRAPE STERI 35X30 U-POUCH (DRAPES) ×2 IMPLANT
DRAPE U-SHAPE 47X51 STRL (DRAPES) ×1 IMPLANT
DRILL RIGID 1.8MM FBRTK STRL (DRILL) ×2
DRSG TEGADERM 4X4.75 (GAUZE/BANDAGES/DRESSINGS) ×5 IMPLANT
ELECT CAUTERY BLADE TIP 2.5 (TIP) ×2
ELECT REM PT RETURN 9FT ADLT (ELECTROSURGICAL)
ELECTRODE CAUTERY BLDE TIP 2.5 (TIP) ×1 IMPLANT
ELECTRODE REM PT RTRN 9FT ADLT (ELECTROSURGICAL) IMPLANT
GAUZE SPONGE 4X4 12PLY STRL (GAUZE/BANDAGES/DRESSINGS) ×2 IMPLANT
GAUZE XEROFORM 1X8 LF (GAUZE/BANDAGES/DRESSINGS) ×1 IMPLANT
GLOVE BIO SURGEON STRL SZ7.5 (GLOVE) ×5 IMPLANT
GLOVE BIOGEL PI IND STRL 8 (GLOVE) ×2 IMPLANT
GLOVE BIOGEL PI INDICATOR 8 (GLOVE) ×2
GLOVE SURG SYN 7.5  E (GLOVE) ×2
GLOVE SURG SYN 7.5 E (GLOVE) ×2 IMPLANT
GLOVE SURG SYN 7.5 PF PI (GLOVE) ×1 IMPLANT
GOWN STRL REUS W/ TWL LRG LVL3 (GOWN DISPOSABLE) ×2 IMPLANT
GOWN STRL REUS W/TWL LRG LVL3 (GOWN DISPOSABLE) ×1
GOWN STRL REUS W/TWL LRG LVL4 (GOWN DISPOSABLE) ×2 IMPLANT
IMPL REGENETEN MEDIUM (Shoulder) IMPLANT
IMPLANT REGENETEN MEDIUM (Shoulder) ×2 IMPLANT
IV LACTATED RINGER IRRG 3000ML (IV SOLUTION) ×9
IV LR IRRIG 3000ML ARTHROMATIC (IV SOLUTION) ×4 IMPLANT
KIT CORKSCREW KNTLS 3.9 S/T/P (INSTRUMENTS) ×1 IMPLANT
KIT SPEAR STR 1.6MM DRILL (MISCELLANEOUS) ×1 IMPLANT
KIT STABILIZATION SHOULDER (MISCELLANEOUS) ×2 IMPLANT
KIT SUTURETAK 3.0 INSERT PERC (KITS) ×2 IMPLANT
KIT TURNOVER KIT A (KITS) ×2 IMPLANT
MANIFOLD NEPTUNE II (INSTRUMENTS) ×3 IMPLANT
MASK FACE SPIDER DISP (MASK) ×2 IMPLANT
MAT ABSORB  FLUID 56X50 GRAY (MISCELLANEOUS) ×2
MAT ABSORB FLUID 56X50 GRAY (MISCELLANEOUS) ×2 IMPLANT
NDL MAYO 6 CRC TAPER PT (NEEDLE) IMPLANT
NDL SAFETY ECLIPSE 18X1.5 (NEEDLE) ×1 IMPLANT
NDL SCORPION MULTI FIRE (NEEDLE) IMPLANT
NEEDLE HYPO 18GX1.5 SHARP (NEEDLE) ×1
NEEDLE HYPO 22GX1.5 SAFETY (NEEDLE) ×2 IMPLANT
NEEDLE MAYO 6 CRC TAPER PT (NEEDLE) ×2 IMPLANT
NEEDLE SCORPION MULTI FIRE (NEEDLE) ×2 IMPLANT
NS IRRIG 500ML POUR BTL (IV SOLUTION) ×2 IMPLANT
PACK ARTHROSCOPY SHOULDER (MISCELLANEOUS) ×2 IMPLANT
PAD ABD DERMACEA PRESS 5X9 (GAUZE/BANDAGES/DRESSINGS) ×1 IMPLANT
PAD WRAPON POLAR SHDR XLG (MISCELLANEOUS) ×1 IMPLANT
SET TUBE SUCT SHAVER OUTFL 24K (TUBING) ×2 IMPLANT
SET TUBE TIP INTRA-ARTICULAR (MISCELLANEOUS) ×2 IMPLANT
SLING ULTRA II M (MISCELLANEOUS) ×1 IMPLANT
STRAP SAFETY 5IN WIDE (MISCELLANEOUS) ×2 IMPLANT
SUT ETHILON 3-0 FS-10 30 BLK (SUTURE) ×2
SUT LASSO 90 DEG SD STR (SUTURE) ×2 IMPLANT
SUT MNCRL 4-0 (SUTURE) ×1
SUT MNCRL 4-0 27XMFL (SUTURE) ×1
SUT PDS AB 0 CT1 27 (SUTURE) ×2 IMPLANT
SUTURE EHLN 3-0 FS-10 30 BLK (SUTURE) ×1 IMPLANT
SUTURE MNCRL 4-0 27XMF (SUTURE) ×1 IMPLANT
SYR 10ML LL (SYRINGE) ×2 IMPLANT
TAPE CLOTH 3X10 WHT NS LF (GAUZE/BANDAGES/DRESSINGS) ×2 IMPLANT
TUBING ARTHRO INFLOW-ONLY STRL (TUBING) ×2 IMPLANT
TUBING CONNECTING 10 (TUBING) ×2 IMPLANT
WAND WEREWOLF FLOW 90D (MISCELLANEOUS) ×1 IMPLANT
WRAPON POLAR PAD SHDR XLG (MISCELLANEOUS) ×2

## 2018-09-08 NOTE — Transfer of Care (Signed)
Immediate Anesthesia Transfer of Care Note  Patient: Charles Mooney  Procedure(s) Performed: LEFT SHOULDER ARTHROSCOPY,SUBSACAPULARIS REPAIR, SUBACROMIAL DECOMP, DISTAL CLAVICLE EXCISION,BICEP TENODESIS, MINI OPEN REGENTEN PATCH APPLICATION VS.  ROTATOR CUFF REPAIR (Left )  Patient Location: PACU  Anesthesia Type:General  Level of Consciousness: awake, alert  and oriented  Airway & Oxygen Therapy: Patient Spontanous Breathing and Patient connected to face mask oxygen  Post-op Assessment: Report given to RN and Post -op Vital signs reviewed and stable  Post vital signs: Reviewed and stable  Last Vitals:  Vitals Value Taken Time  BP 115/70 09/08/18 1102  Temp    Pulse 65 09/08/18 1105  Resp 18 09/08/18 1105  SpO2 100 % 09/08/18 1105  Vitals shown include unvalidated device data.  Last Pain:  Vitals:   09/08/18 0626  PainSc: 6          Complications: No apparent anesthesia complications

## 2018-09-08 NOTE — Anesthesia Procedure Notes (Addendum)
Anesthesia Regional Block: Interscalene brachial plexus block   Pre-Anesthetic Checklist: ,, timeout performed, Correct Patient, Correct Site, Correct Laterality, Correct Procedure, Correct Position, site marked, Risks and benefits discussed,  Surgical consent,  Pre-op evaluation,  At surgeon's request and post-op pain management  Laterality: Left and Upper  Prep: chloraprep       Needles:  Injection technique: Single-shot  Needle Type: Stimiplex     Needle Length: 5cm  Needle Gauge: 22     Additional Needles:   Procedures:,,,, ultrasound used (permanent image in chart),,,,  Narrative:  Start time: 09/08/2018 7:35 AM End time: 09/08/2018 7:39 AM Injection made incrementally with aspirations every 5 mL.  Performed by: Personally  Anesthesiologist: Martha Clan, MD  Additional Notes: Functioning IV was confirmed and monitors were applied.  A 38mm 22ga Stimuplex needle was used. Sterile prep and drape,hand hygiene and sterile gloves were used.  Negative aspiration and negative test dose prior to incremental administration of local anesthetic. The patient tolerated the procedure well.

## 2018-09-08 NOTE — Discharge Instructions (Addendum)
Post-Op Instructions - Rotator Cuff Repair ° °1. Bracing: You will wear a shoulder immobilizer or sling for 6 weeks.  ° °2. Driving: No driving for 3 weeks post-op. When driving, do not wear the immobilizer. Ideally, we recommend no driving for 6 weeks while sling is in place as one arm will be immobilized.  ° °3. Activity: No active lifting for 2 months. Wrist, hand, and elbow motion only. Avoid lifting the upper arm away from the body except for hygiene. You are permitted to bend and straighten the elbow passively only (no active elbow motion). You may use your hand and wrist for typing, writing, and managing utensils (cutting food). Do not lift more than a coffee cup for 8 weeks.  When sleeping or resting, inclined positions (recliner chair or wedge pillow) and a pillow under the forearm for support may provide better comfort for up to 4 weeks.  Avoid long distance travel for 4 weeks. ° °Return to normal activities after rotator cuff repair repair normally takes 6 months on average. If rehab goes very well, may be able to do most activities at 4 months, except overhead or contact sports. ° °4. Physical Therapy: Begins 3-4 days after surgery, and proceed 1 time per week for the first 6 weeks, then 1-2 times per week from weeks 6-20 post-op. ° °5. Medications:  °- You will be provided a prescription for narcotic pain medicine. After surgery, take 1-2 narcotic tablets every 4 hours if needed for severe pain.  °- A prescription for anti-nausea medication will be provided in case the narcotic medicine causes nausea - take 1 tablet every 6 hours only if nauseated.   °- Take tylenol 1000 mg (2 Extra Strength tablets or 3 regular strength) every 8 hours for pain.  May decrease or stop tylenol 5 days after surgery if you are having minimal pain. °- Take ASA 325mg/day x 2 weeks to help prevent DVTs/PEs (blood clots).  °- DO NOT take ANY nonsteroidal anti-inflammatory pain medications (Advil, Motrin, Ibuprofen, Aleve,  Naproxen, or Naprosyn). These medicines can inhibit healing of your shoulder repair.  ° ° °If you are taking prescription medication for anxiety, depression, insomnia, muscle spasm, chronic pain, or for attention deficit disorder, you are advised that you are at a higher risk of adverse effects with use of narcotics post-op, including narcotic addiction/dependence, depressed breathing, death. °If you use non-prescribed substances: alcohol, marijuana, cocaine, heroin, methamphetamines, etc., you are at a higher risk of adverse effects with use of narcotics post-op, including narcotic addiction/dependence, depressed breathing, death. °You are advised that taking > 50 morphine milligram equivalents (MME) of narcotic pain medication per day results in twice the risk of overdose or death. For your prescription provided: oxycodone 5 mg - taking more than 6 tablets per day would result in > 50 morphine milligram equivalents (MME) of narcotic pain medication. °Be advised that we will prescribe narcotics short-term, for acute post-operative pain only - 3 weeks for major operations such as shoulder repair/reconstruction surgeries.  ° ° ° °6. Post-Op Appointment: ° °Your first post-op appointment will be 10-14 days post-op. ° °7. Work or School: For most, but not all procedures, we advise staying out of work or school for at least 1 to 2 weeks in order to recover from the stress of surgery and to allow time for healing.  ° °If you need a work or school note this can be provided.  ° °8. Smoking: If you are a smoker, you need to refrain from   smoking in the postoperative period. The nicotine in cigarettes will inhibit healing of your shoulder repair and decrease the chance of successful repair. Similarly, nicotine containing products (gum, patches) should be avoided.  ° °Post-operative Brace: °Apply and remove the brace you received as you were instructed to at the time of fitting and as described in detail as the brace’s  instructions for use indicate.  Wear the brace for the period of time prescribed by your physician.  The brace can be cleaned with soap and water and allowed to air dry only.  Should the brace result in increased pain, decreased feeling (numbness/tingling), increased swelling or an overall worsening of your medical condition, please contact your doctor immediately.  If an emergency situation occurs as a result of wearing the brace after normal business hours, please dial 911 and seek immediate medical attention.  Let your doctor know if you have any further questions about the brace issued to you. °Refer to the shoulder sling instructions for use if you have any questions regarding the correct fit of your shoulder sling.  °[facility] Customer Care for Troubleshooting: [phone] ° °Video that illustrates how to properly use a shoulder sling: °"Instructions for Proper Use of an Orthopaedic Sling" °https://www.youtube.com/watch?v=AHZpn_Xo45w ° ° °AMBULATORY SURGERY  °DISCHARGE INSTRUCTIONS ° ° °1) The drugs that you were given will stay in your system until tomorrow so for the next 24 hours you should not: ° °A) Drive an automobile °B) Make any legal decisions °C) Drink any alcoholic beverage ° ° °2) You may resume regular meals tomorrow.  Today it is better to start with liquids and gradually work up to solid foods. ° °You may eat anything you prefer, but it is better to start with liquids, then soup and crackers, and gradually work up to solid foods. ° ° °3) Please notify your doctor immediately if you have any unusual bleeding, trouble breathing, redness and pain at the surgery site, drainage, fever, or pain not relieved by medication. ° ° ° °4) Additional Instructions: ° ° ° ° ° ° ° °Please contact your physician with any problems or Same Day Surgery at [phone], Monday through Friday 6 am to 4 pm, or [place] [facility] number at [phone]. ° ° °

## 2018-09-08 NOTE — Anesthesia Procedure Notes (Signed)
Procedure Name: Intubation Date/Time: 09/08/2018 7:56 AM Performed by: Allean Found, CRNA Pre-anesthesia Checklist: Patient identified, Patient being monitored, Timeout performed, Emergency Drugs available and Suction available Patient Re-evaluated:Patient Re-evaluated prior to induction Oxygen Delivery Method: Circle system utilized Preoxygenation: Pre-oxygenation with 100% oxygen Induction Type: IV induction Ventilation: Mask ventilation without difficulty Laryngoscope Size: McGraph and 4 Grade View: Grade I Tube type: Oral Tube size: 7.5 mm Number of attempts: 1 Airway Equipment and Method: Stylet Placement Confirmation: ETT inserted through vocal cords under direct vision,  positive ETCO2 and breath sounds checked- equal and bilateral Secured at: 23 cm Tube secured with: Tape Dental Injury: Teeth and Oropharynx as per pre-operative assessment

## 2018-09-08 NOTE — Anesthesia Post-op Follow-up Note (Signed)
Anesthesia QCDR form completed.        

## 2018-09-08 NOTE — H&P (Signed)
Paper H&P to be scanned into permanent record. H&P reviewed. No significant changes noted.  

## 2018-09-08 NOTE — Anesthesia Preprocedure Evaluation (Addendum)
Anesthesia Evaluation  Patient identified by MRN, date of birth, ID band Patient awake    Reviewed: Allergy & Precautions, H&P , NPO status , Patient's Chart, lab work & pertinent test results, reviewed documented beta blocker date and time   History of Anesthesia Complications Negative for: history of anesthetic complications  Airway Mallampati: I  TM Distance: >3 FB Neck ROM: full    Dental  (+) Dental Advidsory Given, Caps, Teeth Intact   Pulmonary neg pulmonary ROS, former smoker,           Cardiovascular Exercise Tolerance: Good negative cardio ROS       Neuro/Psych neg Seizures CVA (s/p aneurysm rupture, now coiled) negative psych ROS   GI/Hepatic negative GI ROS, (+) Hepatitis - (s/p treatment), C  Endo/Other  negative endocrine ROS  Renal/GU negative Renal ROS  negative genitourinary   Musculoskeletal   Abdominal   Peds  Hematology negative hematology ROS (+)   Anesthesia Other Findings Past Medical History: No date: Hepatitis     Comment:  c 2000   Reproductive/Obstetrics negative OB ROS                             Anesthesia Physical Anesthesia Plan  ASA: II  Anesthesia Plan: General   Post-op Pain Management:  Regional for Post-op pain   Induction: Intravenous  PONV Risk Score and Plan:   Airway Management Planned: Oral ETT  Additional Equipment:   Intra-op Plan:   Post-operative Plan: Extubation in OR  Informed Consent: I have reviewed the patients History and Physical, chart, labs and discussed the procedure including the risks, benefits and alternatives for the proposed anesthesia with the patient or authorized representative who has indicated his/her understanding and acceptance.     Dental Advisory Given  Plan Discussed with: Anesthesiologist, CRNA and Surgeon  Anesthesia Plan Comments:        Anesthesia Quick Evaluation

## 2018-09-08 NOTE — Op Note (Signed)
SURGERY DATE: 09/08/2018  PRE-OP DIAGNOSIS:  1. Left subacromial impingement 2. Left biceps tendinopathy 3. Left partial thickness rotator cuff tears (supraspinatus and infraspinatus) 4. Left acromioclavicular joint osteoarthritis  POST-OP DIAGNOSIS: 1. Left subacromial impingement 2. Left biceps tendinopathy 3. Left partial thickness rotator cuff tears (supraspinatus and infraspinatus) 4. Left acromioclavicular joint osteoarthritis  PROCEDURES:  1. Left arthroscopic subscapularis repair 2. Left mini-open rotator cuff (supraspinatus) repair with Regeneten patch 3. Left open biceps tenodesis 4. Left arthroscopic distal clavicle excision 5. Left extensive debridement of shoulder (glenohumeral and subacromial spaces) 6. Left arthroscopic subacromial decompression  SURGEON: Rosealee AlbeeSunny H. Carolos Fecher, MD  ANESTHESIA: Gen with interscalene block w/Exparil  ESTIMATED BLOOD LOSS: 10cc  DRAINS:  none  TOTAL IV FLUIDS: per anesthesia   SPECIMENS: none  IMPLANTS:  - [employer] Regeneten patch with associated tendon and bone staples - Arthrex FiberTak 1.428mm - x1 - Arthrex 3.279mm Knotless Corkscrew x1  OPERATIVE FINDINGS:  Examination under anesthesia: A careful examination under anesthesia was performed.  Passive range of motion was: FF: 150; ER at side: 60; ER in abduction: 100; IR in abduction: 50.  Anterior load shift: NT.  Posterior load shift: NT.  Sulcus in neutral: NT.  Sulcus in ER: NT.    Intra-operative findings: A thorough arthroscopic examination of the shoulder was performed.  The findings are: 1. Biceps tendon: tendinopathy with erythema and thickening with slight medial subluxation 2. Superior labrum: Type 2 SLAP tear 3. Posterior labrum and capsule: normal 4. Inferior capsule and inferior recess: normal 5. Glenoid cartilage surface: Normal 6. Supraspinatus attachment: partial thickness tearing of entire anterior to posterior aspect of supraspinatus involving ~50% of the  articular surface 7. Posterior rotator cuff attachment: normal 8. Humeral head articular cartilage: normal 9. Rotator interval: significant synovitis 10: Subscapularis tendon: articular-sided partial thickness tear of superior 1/3 of tendon 11. Anterior labrum: degenerative 12. IGHL: normal  OPERATIVE REPORT:   Indications for procedure: Charles Mooney is a 63 y.o. year old male with ~1 year of shoulder pain without known traumatic event. He has has failed nonoperative management including rest, activity modification, physical therapy, and corticosteroid injection. MRI and clinical exam were concerning for partial thickness supraspinatus and infraspinatus tears with significant AC joint arthritis, biceps tendinopathy, and subacromial impingement. After discussion of risks, benefits, and alternatives to surgery, the patient elected to proceed with above mentioned procedure. The patient understands that use of the Regeneten patch is relatively new and long-term data is unknown.  Procedure in detail:  I identified Charles Mooney in the pre-operative holding area.  I marked the operative shoulder with my initials. I reviewed the risks and benefits of the proposed surgical intervention, and the patient (and/or patient's guardian) wished to proceed.  Anesthesia was then performed with an interscalene block with Exparil.  The patient was transferred to the operative suite and placed in the beach chair position.    SCDs were placed on the lower extremities. Appropriate IV antibiotics were administered. The operative upper extremity was then prepped and draped in standard fashion. A time out was performed confirming the correct extremity, correct patient and correct procedure.   I then created a standard posterior portal with an 11 blade. The glenohumeral joint was easily entered with a blunt trochar and the arthroscope introduced. The findings of diagnostic arthroscopy are described above. I debrided the  degenerative anterior labrum and also debrided and coagulated the inflamed synovium to obtain hemostasis and reduce the risk of post-operative swelling using an  Arthrocare radiofrequency device. I performed a biceps tenotomy using an arthroscopic scissors and used a motorized shaver and Arthrocare wand to debride the stump back to a stable base.   The subscapularis tear was identified. The comma tissue indicating the superolateral border of the subscapularis was identified readily.  The tip of the coracoid as well as the conjoined tendon and coracoacromial ligaments were visualized after debriding rotator interval tissue.  Tissue about the subscapularis was released anteriorly, superiorly, and posteriorly. The lesser tuberosity footprint was prepared with a combination of electrocautery and an arthroscopic curette.  An Arthrex knotless corkscrew was placed into the lesser tuberosity footprint from the anterior portal.  A Scorpion suture passing device was used to pass the passing suture through the torn portion of the subscapularis from the anterior portal.  The suture was shuttled through the anchor. The arm was placed in neutral rotation and the repair was tensioned appropriately. This appropriately reduced the subscapularis tear.  The arm was then internally and externally rotated and the subscapularis was noted to move appropriately with rotation.  The remainder of the suture was then cut.  A spinal needle was placed in the posterior portion of the partial thickness supraspinatus tear and an 0-PDS suture was passed through the needle and retrieved out of the anterior portal.  Next, the arthroscope was then introduced into the subacromial space. A direct lateral portal was created with an 11-blade after spinal needle localization. An extensive subacromial bursectomy was performed using a combination of the shaver and Arthrocare wand. The entire acromial undersurface was exposed and the CA ligament was  subperiosteally elevated to expose the prominent anterior acromial hook. A burr was used to create a flat anterior and lateral aspect of the acromion, converting it from a Type 2 to a Type 1 acromion. Care was made to keep the deltoid fascia intact.  Next, I exposed the acromioclavicular joint using a combination of shaver and arthrocare wand. The distal 29mm of clavicle was removed using a 5.41mm burr (2 burr widths removed). Adequate resection was confirmed by placing the camera into the anterior portal and by using a probe to measure the distance between the acromion and distal clavicle. Care was taken to preserve the superior and posterior capsule. The bursal side of the supraspinatus was found to be intact. Hemostasis was achieved and fluid was evacuated from the shoulder.   A longitudinal incision from the anterolateral acromion ~6cm in length was made overlying the raphe between the anterior and middle heads of the deltoid. The raphe was identified and it was incised. The subacromial space was identified. Any remaining bursa was excised. We then turned our attention to the biceps tenodesis. The arm was externally rotated.  The bicipital groove was identified.  A 15 blade was used to make a cut overlying the biceps tendon, and the tendon was removed using a right angle clamp.  The base of the bicipital groove was identified and cleared of soft tissue.  A FiberTak anchor was placed in the bicipital groove.  The biceps tendon was held at the appropriate amount of tension.  One set of sutures was passed through the biceps anchor with one limb passed in a simple fashion and the second limb passed in a simple plus locking stitch pattern.  This was repeated for the other set of sutures.  This construct allowed for shuttling the biceps tendon down to the bone.  The sutures were tied and cut.  The diseased portion of the proximal biceps  was then excised.  The arm was then internally rotated.  I verified that the  bursal side of the rotator cuff was intact. The previously passed 0-PDS suture was identified and partial thickness tearing was palpable in this region. We decided to proceed with Regeneten patch placement. The Regeneten patch delivery gun was placed appropriately and the patch was delivered over the supraspinatus tendon. It was positioned such that all areas of partial-thickness rotator cuff tear were covered. Tendon staples were placed medially, anteriorly, and posteriorly. Two bone staples were then placed laterally. The patch was then probed to confirm appropriate stability.   The wound was thoroughly irrigated.  The deltoid split was closed with 0 Vicryl.  The subdermal layer was closed with 2-0 Vicryl.  The skin was closed with 4-0 Monocryl and Dermabond. The portals were closed with 3-0 Nylon. Xeroform was applied to the portals. A sterile dressing was applied, followed by a Polar Care sleeve and a SlingShot shoulder immobilizer/sling. The patient awoke from anesthesia without difficulty and was transferred to the PACU in stable condition.    COMPLICATIONS: none  DISPOSITION: plan for discharge home after recovery in PACU  POSTOPERATIVE PLAN: Remain in sling (except hygiene and elbow/wrist/hand RoM exercises as instructed by PT) x 4 weeks and NWB for this time. PT to begin 3-4 days after surgery. Use subscapularis repair rotator cuff rehab protocol with biceps tenodesis and distal clavicle excision protocol.

## 2018-09-09 NOTE — Anesthesia Postprocedure Evaluation (Signed)
Anesthesia Post Note  Patient: MICKEL SCHREUR  Procedure(s) Performed: LEFT SHOULDER ARTHROSCOPY,SUBSACAPULARIS REPAIR, SUBACROMIAL DECOMP, DISTAL CLAVICLE EXCISION,BICEP TENODESIS, MINI OPEN REGENTEN PATCH APPLICATION (Left )  Patient location during evaluation: PACU Anesthesia Type: General and Regional Level of consciousness: awake and alert Pain management: pain level controlled Vital Signs Assessment: post-procedure vital signs reviewed and stable Respiratory status: spontaneous breathing, nonlabored ventilation, respiratory function stable and patient connected to nasal cannula oxygen Cardiovascular status: blood pressure returned to baseline and stable Postop Assessment: no apparent nausea or vomiting Anesthetic complications: no     Last Vitals:  Vitals:   09/08/18 1214 09/08/18 1301  BP: 119/71 126/77  Pulse: 61 (!) 58  Resp: 16 16  Temp: (!) 36.1 C   SpO2: 98% 100%    Last Pain:  Vitals:   09/08/18 1301  TempSrc:   PainSc: 6                  Martha Clan

## 2019-03-04 ENCOUNTER — Other Ambulatory Visit: Payer: Self-pay | Admitting: Orthopedic Surgery

## 2019-03-04 DIAGNOSIS — M545 Low back pain, unspecified: Secondary | ICD-10-CM

## 2019-03-04 NOTE — Progress Notes (Signed)
ain

## 2019-04-27 ENCOUNTER — Other Ambulatory Visit: Payer: Self-pay

## 2019-04-27 ENCOUNTER — Ambulatory Visit (HOSPITAL_BASED_OUTPATIENT_CLINIC_OR_DEPARTMENT_OTHER): Payer: Medicare Other | Admitting: Student in an Organized Health Care Education/Training Program

## 2019-04-27 ENCOUNTER — Ambulatory Visit
Admission: RE | Admit: 2019-04-27 | Discharge: 2019-04-27 | Disposition: A | Discharge status: Home / Self Care | Payer: Medicare Other | Source: Ambulatory Visit | Attending: Student in an Organized Health Care Education/Training Program | Admitting: Student in an Organized Health Care Education/Training Program

## 2019-04-27 ENCOUNTER — Encounter: Payer: Self-pay | Admitting: Student in an Organized Health Care Education/Training Program

## 2019-04-27 VITALS — BP 168/95 | HR 85 | Temp 98.6°F | Resp 16 | Ht 72.0 in | Wt 220.0 lb

## 2019-04-27 DIAGNOSIS — M47816 Spondylosis without myelopathy or radiculopathy, lumbar region: Secondary | ICD-10-CM

## 2019-04-27 DIAGNOSIS — M19011 Primary osteoarthritis, right shoulder: Secondary | ICD-10-CM | POA: Diagnosis not present

## 2019-04-27 DIAGNOSIS — G894 Chronic pain syndrome: Secondary | ICD-10-CM | POA: Insufficient documentation

## 2019-04-27 DIAGNOSIS — M5442 Lumbago with sciatica, left side: Secondary | ICD-10-CM

## 2019-04-27 DIAGNOSIS — M533 Sacrococcygeal disorders, not elsewhere classified: Secondary | ICD-10-CM | POA: Diagnosis present

## 2019-04-27 DIAGNOSIS — M5136 Other intervertebral disc degeneration, lumbar region: Secondary | ICD-10-CM | POA: Diagnosis not present

## 2019-04-27 DIAGNOSIS — G8929 Other chronic pain: Secondary | ICD-10-CM

## 2019-04-27 DIAGNOSIS — Z79891 Long term (current) use of opiate analgesic: Secondary | ICD-10-CM

## 2019-04-27 DIAGNOSIS — M5441 Lumbago with sciatica, right side: Secondary | ICD-10-CM

## 2019-04-27 DIAGNOSIS — M19012 Primary osteoarthritis, left shoulder: Secondary | ICD-10-CM | POA: Insufficient documentation

## 2019-04-27 DIAGNOSIS — Z79899 Other long term (current) drug therapy: Secondary | ICD-10-CM | POA: Insufficient documentation

## 2019-04-27 DIAGNOSIS — M51369 Other intervertebral disc degeneration, lumbar region without mention of lumbar back pain or lower extremity pain: Secondary | ICD-10-CM | POA: Insufficient documentation

## 2019-04-27 MED ORDER — PREGABALIN 25 MG PO CAPS: ORAL_CAPSULE | ORAL | 0 refills | Status: DC

## 2019-04-27 MED ORDER — TIZANIDINE HCL 4 MG PO TABS: 4.0000 mg | ORAL_TABLET | Freq: Every evening | ORAL | 0 refills | Status: AC | PRN

## 2019-04-27 NOTE — Progress Notes (Signed)
Cancelled visit

## 2019-04-27 NOTE — Progress Notes (Signed)
Patient: Charles Mooney  Service Category: E/M  Provider: Gillis Santa, MD  DOB: [birth year]  DOS: 04/27/2019  Referring Provider: Leim Fabry, MD  MRN: [id number]  Setting: Ambulatory outpatient  PCP: Gale Journey, MD  Type: New Patient  Specialty: Interventional Pain Management    Location: Office  Delivery: Face-to-face     Primary Reason(s) for Visit: Encounter for initial evaluation of one or more chronic problems (new to examiner) potentially causing chronic pain, and posing a threat to normal musculoskeletal function. 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 year old, male patient, who comes today to see [place] for the first time for an initial evaluation of his chronic pain. He has Chronic bilateral low back pain with bilateral sciatica; Acute bursitis of right shoulder; Nonallopathic lesion of sacral region; Nonallopathic lesion of thoracic region; Nonallopathic lesion of lumbosacral region; Lumbar facet arthropathy; Lumbar degenerative disc disease; Localized osteoarthritis of shoulder regions, bilateral; Encounter for long-term opiate analgesic use; Chronic SI joint pain; and Chronic pain syndrome on their problem list. 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  when the back "acts up"  the right heel hurts. Onset:  many years ago Duration: Chronic pain Quality:  aching, throbbing, stabbing, sharp, shooting Severity: 8 /10 (subjective,  self-reported pain score)  Effect on ADL:  limits ability to do household chores  Timing:   worse with exertion and prolonged standing Modifying factors:  rest, stretching, PT BP: (!) 168/95  HR: 85  Onset and Duration: Gradual and Date of onset: 40 years ago Cause of pain: Unknown Severity: Getting worse, NAS-11 at its worse: 7/10, NAS-11 at its best: 4/10, NAS-11 now: 4/10 and NAS-11 on the average: 6/10 Timing: Morning, Afternoon, Night, During activity or exercise, After activity or exercise and After a period of immobility Aggravating Factors: Bending, Kneeling, Lifiting, Motion, Prolonged sitting, Prolonged standing, Stooping , Twisting and Working Alleviating Factors: Acupuncture, Stretching, Cold packs, Hot packs, Medications, Resting, TENS, Using a brace, Warm showers or baths, Chiropractic manipulations and PT Associated Problems: Day-time cramps, Night-time cramps, Fatigue, Inability to concentrate, Numbness, Spasms, Tingling, Weakness, Pain that wakes patient up and Pain that does not allow patient to sleep Quality of Pain: Aching, Agonizing, Annoying, Constant, Intermittent, Cramping, Cruel, Deep, Disabling, Distressing, Dreadful, Dull, Exhausting and Fearful Previous Examinations or Tests: MRI scan, X-rays, Orthopedic evaluation and Chiropractic evaluation Previous Treatments: Chiropractic manipulations, Narcotic medications, Physical Therapy, Stretching exercises and TENS  The patient comes into the clinics today for the first time for a chronic pain management evaluation.   Chronic Low back pain, present for >25 years. Has tried PT, conservative management. Worse with walking. Makes it difficult to stand for a prolonged period of time.  Has tried Gabapentin and Cymbalta in the past but couldn't tolerate due to side effects of cognitive dysfunction, mood change, and sedation. Is currently seeing Pain specialist Dr Victorio Palm in [place]. Not happy with care there, states that he feels  like a "drug addict" doing to this  clinic. No red flags in history. He states that he utilizes Oxycodone approximately 5x/ week; only when he has severe pain. Does not want to be on high dose opioid therapy. Does find benefit with Tizanidine at 2 mg but feels that it's not "strong" enough. Has not tried 4 mg. Last MRI was over 5 years ago. Denies recent injections.  Bilateral shoulder pain due to shoulder OA and rotator cuff dysfunction: s/p surgery 09/08/18: Left arthroscopic subscapularis repair,  Left mini-open rotator cuff (supraspinatus) repair with Regeneten patch, Left open biceps tenodesis,  Left arthroscopic distal clavicle excision,  Left extensive debridement of shoulder (glenohumeral and subacromial spaces). Left arthroscopic subacromial decompression.  Patient also has a history of stroke, SAH. Some residual cognitive deficits.   Historic Controlled Substance Pharmacotherapy Review  PMP and historical list of controlled substances:   04/06/2019  1   04/06/2019  Hydrocodone-Acetamin 10-325 MG  30.00  15 Gr Cri   [id number]   254 (3732)   0  20.00 MME  Comm Ins   [place]   Medications: The patient did not bring the medication(s) to the appointment, as requested in our "New Patient Package" Pharmacodynamics: Desired effects: Analgesia: The patient reports >50% benefit. Reported improvement in function: The patient reports medication allows him to accomplish basic ADLs. Clinically meaningful improvement in function (CMIF): Sustained CMIF goals met Perceived effectiveness: Described as relatively effective, allowing for increase in activities of daily living (ADL) Undesirable effects: Side-effects or Adverse reactions: None reported Historical Monitoring: The patient  reports previous drug use. List of all UDS Test(s): No results found for: MDMA, COCAINSCRNUR, [place], [place], CANNABQUANT, [place], [place] List of other Serum/Urine Drug Screening Test(s):  No results found for: AMPHSCRSER,  BARBSCRSER, BENZOSCRSER, COCAINSCRSER, COCAINSCRNUR, PCPSCRSER, PCPQUANT, THCSCRSER, THCU, CANNABQUANT, OPIATESCRSER, OXYSCRSER, PROPOXSCRSER, ETH Historical Background Evaluation: [place] PMP: PDMP not reviewed this encounter. Six (6) year initial data search conducted.             [place] Department of public safety, offender search: Editor, commissioning Information) Non-contributory Risk Assessment Profile: Aberrant behavior: None observed or detected today Risk factors for fatal opioid overdose: None identified today Fatal overdose hazard ratio (HR): Calculation deferred Non-fatal overdose hazard ratio (HR): Calculation deferred Risk of opioid abuse or dependence: 0.7-3.0% with doses ? 36 MME/day and 6.1-26% with doses ? 120 MME/day. Substance use disorder (SUD) risk level: See below Personal History of Substance Abuse (SUD-Substance use disorder):  Alcohol: Negative  Illegal Drugs: Negative  Rx Drugs: Negative  ORT Risk Level calculation: Low Risk Opioid Risk Tool - 04/27/19 1357      Family History of Substance Abuse   Alcohol  --   unknown   Illegal Drugs  --   unknown   Rx Drugs  --   unknown     Personal History of Substance Abuse   Alcohol  Negative    Illegal Drugs  Negative    Rx Drugs  Negative      Age   Age between 53-45 years   No      Psychological Disease   Psychological Disease  Negative    Depression  Negative      Total Score   Opioid Risk Tool Scoring  0    Opioid Risk Interpretation  Low Risk      ORT Scoring interpretation table:  Score <3 = Low Risk for SUD  Score between 4-7 = Moderate Risk for SUD  Score >8 = High Risk for Opioid Abuse    Pharmacologic Plan:  As per protocol, I have not taken over any controlled substance management, pending the results of ordered tests and/or consults.            Initial impression: Pending review of available data and ordered tests.  Meds   Current Outpatient Medications:  .  acetaminophen (TYLENOL) 500 MG tablet, Take 2  tablets (1,000 mg total) by mouth every 8 (eight) hours., Disp: 90 tablet, Rfl: 2 .  b complex vitamins tablet, Take 1 tablet by mouth daily., Disp: , Rfl:  .  cholecalciferol (VITAMIN D3) 25 MCG (1000 UT) tablet, Take 1,000 Units by mouth daily., Disp: , Rfl:  .  HYDROcodone-acetaminophen (NORCO) 10-325 MG tablet, Take 1 tablet by mouth daily., Disp: , Rfl:  .  Turmeric 500 MG CAPS, Take 500 mg by mouth daily., Disp: , Rfl:  .  oxyCODONE (ROXICODONE) 5 MG immediate release tablet, Take 1-2 tablets (5-10 mg total) by mouth every 4 (four) hours as needed (pain). (Patient not taking: Reported on 04/27/2019), Disp: 45 tablet, Rfl: 0 .  pregabalin (LYRICA) 25 MG capsule, Take 1 capsule (25 mg total) by mouth at bedtime for 14 days, THEN 2 capsules (50 mg total) at bedtime for 16 days., Disp: 46 capsule, Rfl: 0 .  tiZANidine (ZANAFLEX) 4 MG tablet, Take 1 tablet (4 mg total) by mouth at bedtime as needed for muscle spasms., Disp: 30 tablet, Rfl: 0 .  Vitamin D, Ergocalciferol, (DRISDOL) 50000 units CAPS capsule, Take 1 capsule (50,000 Units total) by mouth every 7 (seven) days. (Patient not taking: Reported on 08/28/2018), Disp: 12 capsule, Rfl: 0  Imaging Review  Cervical Imaging:  Results for orders placed during the hospital encounter of 11/21/17  MR SHOULDER RIGHT WO CONTRAST   Narrative CLINICAL DATA:  Right shoulder pain and limited range of motion for 6 months. No known injury.  EXAM: MRI OF THE RIGHT SHOULDER WITHOUT CONTRAST  TECHNIQUE: Multiplanar, multisequence MR imaging of the shoulder was performed. No intravenous contrast was administered.  COMPARISON:  None.  FINDINGS: Rotator cuff: There is extensive heterogeneous increased T2 signal and thickening of the rotator cuff tendons consistent with severe tendinopathy. Tendinopathy is worst in supraspinatus where virtually no normal-appearing tendon is seen.  Muscles: Intermediate intensity edema is seen in the supraspinatus muscle  belly and there is very mild atrophy of the supraspinatus.  Biceps long head: There is severe tendinopathy of both the intra and extra-articular segments, worse in the intra-articular segment.  Acromioclavicular Joint: Moderately severe degenerative change is present. Type 1 acromion. There is subacromial spurring. A large volume of fluid is seen in the subacromial/subdeltoid bursa  Glenohumeral Joint: Mild degenerative change is seen. Prominent fluid is noted in the subscapularis recess.  Labrum: The superior labrum is degenerated but no tear is identified.  Bones:  No fracture or focal lesion.  Other: None.  IMPRESSION: Severe rotator cuff tendinopathy is worst in the supraspinatus where virtually no normal-appearing tendon is identified but no focal tear is seen. There is mild atrophy of the supraspinatus muscle belly.  Tendinopathy of the intra and extra-articular long head of biceps is severe in the intra-articular segment.  Moderately severe acromioclavicular osteoarthritis. Subacromial spurring is noted.  Large volume of subacromial/subdeltoid fluid consistent with bursitis.   Electronically Signed   By: Inge Rise M.D.   On: 11/21/2017 09:08     Results for orders placed during the hospital encounter of 05/23/17  DG Lumbar Spine 2-3 Views   Narrative CLINICAL DATA:  Low back  pain and left-sided numbness and tingling for 6 weeks.  EXAM: LUMBAR SPINE - 2-3 VIEW  COMPARISON:  None.  FINDINGS: Vertebral body height and alignment are maintained. There is loss of disc space height at L5-S1. Mild to moderate facet degenerative change L4-5 and L5-S1 noted. Paraspinous structures are unremarkable.  IMPRESSION: Mild lower lumbar degenerative disease.  No acute finding.   Electronically Signed   By: Inge Rise M.D.   On: 05/23/2017 16:16    MRI LUMBAR SPINE WITHOUT CONTRAST  Indication: 724.2 Lumbago, Low back pain.  Comparison:  None.  Technique: Axial and sagittal T1-weighted and T2-weighted FSE images of the lumbar spine were obtained; the sagittal T2-weighted images were fat-suppressed.   FINDINGS:  The conus is normal in appearance and position. The alignment of the lumbar spine is normal on these supine, neutral images. Discogenic endplate degenerative signal changes at L5-S1.  Degenerative disc desiccation and height loss at L5-S1.  T12-L1 level is seen only on the sagittal images; no posterior disc bulge, canal or foraminal narrowing.  L1-L2: No disc protrusion, canal stenosis, or foraminal stenosis.  L2-L3: No disc protrusion, canal stenosis, or foraminal stenosis.  L3-L4: Mild disc bulge. Mild facet arthrosis and ligamentum flavum thickening. No canal or foraminal stenosis.  L4-L5: Mild disc bulge with mild impress on the thecal sac. Mild facet arthrosis. No canal or foraminal stenosis.  L5-S1: Mild right foraminal eccentric disc osteophyte complex. Mild facet arthrosis. Mild bilateral foraminal narrowing.  Incidentally noted conjoined right S1 and S2 nerve roots.   IMPRESSION:  Mild degenerative disc disease and degenerative joint disease, worst at L5-S1. No canal stenosis. Mild bilateral frontal narrowing at L5-S1.  Electronically Reviewed by: Rennis Petty, MD Electronically Reviewed on: 07/10/2013 4:42 PM  I have reviewed the images and concur with the above findings.  Electronically Signed by: Cathie Beams, MD Electronically Signed on: 07/10/2013 6:34 PM  Complexity Note: Imaging results reviewed. Results shared with Mr. Laforge, using Layman's terms.                         ROS  Cardiovascular: No reported cardiovascular signs or symptoms such as High blood pressure, coronary artery disease, abnormal heart rate or rhythm, heart attack, blood thinner therapy or heart weakness and/or failure Pulmonary or Respiratory: Smoking hx, quit 2017 Neurological: Stroke (Residual  deficits or weakness: 2017 and still has some cognitive residual) Psychological-Psychiatric: No reported psychological or psychiatric signs or symptoms such as difficulty sleeping, anxiety, depression, delusions or hallucinations (schizophrenial), mood swings (bipolar disorders) or suicidal ideations or attempts Gastrointestinal: Inflamed liver (Hepatitis)treated in 2000 hep C Genitourinary: No reported renal or genitourinary signs or symptoms such as difficulty voiding or producing urine, peeing blood, non-functioning kidney, kidney stones, difficulty emptying the bladder, difficulty controlling the flow of urine, or chronic kidney disease Hematological: No reported hematological signs or symptoms such as prolonged bleeding, low or poor functioning platelets, bruising or bleeding easily, hereditary bleeding problems, low energy levels due to low hemoglobin or being anemic Endocrine: No reported endocrine signs or symptoms such as high or low blood sugar, rapid heart rate due to high thyroid levels, obesity or weight gain due to slow thyroid or thyroid disease Rheumatologic: Joint aches and or swelling due to excess weight (Osteoarthritis) Musculoskeletal: Negative for myasthenia gravis, muscular dystrophy, multiple sclerosis or malignant hyperthermia Work History: Unemployed  Allergies  Mr. Cahoon is allergic to sulfa antibiotics.  Laboratory Chemistry Profile   Renal Lab Results  Component  Value Date   BUN 18 09/04/2018   CREATININE 0.81 09/04/2018   GFRAA >60 09/04/2018   GFRNONAA >60 09/04/2018    Electrolytes Lab Results  Component Value Date   NA 138 09/04/2018   K 3.9 09/04/2018   CL 105 09/04/2018   CALCIUM 9.2 09/04/2018    Hepatic Lab Results  Component Value Date   AST 18 09/04/2018   ALT 20 09/04/2018   ALBUMIN 4.4 09/04/2018   ALKPHOS 39 09/04/2018    ID Lab Results  Component Value Date   SARSCOV2NAA NEGATIVE 09/04/2018    Bone No results found for: [place],  [id number], [id number], [id number], 25OHVITD1, 25OHVITD2, 25OHVITD3, TESTOFREE, TESTOSTERONE  Endocrine Lab Results  Component Value Date   GLUCOSE 84 09/04/2018    Neuropathy No results found for: VITAMINB12, FOLATE, HGBA1C, HIV  CNS No results found for: COLORCSF, APPEARCSF, RBCCOUNTCSF, WBCCSF, POLYSCSF, LYMPHSCSF, EOSCSF, PROTEINCSF, GLUCCSF, JCVIRUS, CSFOLI, IGGCSF, LABACHR, ACETBL, LABACHR, ACETBL  Inflammation (CRP: Acute  ESR: Chronic) No results found for: CRP, ESRSEDRATE, LATICACIDVEN  Rheumatology No results found for: RF, ANA, LABURIC, URICUR, LYMEIGGIGMAB, LYMEABIGMQN, HLAB27  Coagulation Lab Results  Component Value Date   PLT 213 09/04/2018    Cardiovascular Lab Results  Component Value Date   HGB 14.9 09/04/2018   HCT 43.8 09/04/2018    Screening Lab Results  Component Value Date   SARSCOV2NAA NEGATIVE 09/04/2018    Cancer No results found for: CEA, CA125, LABCA2  Allergens No results found for: ALMOND, APPLE, ASPARAGUS, AVOCADO, BANANA, BARLEY, BASIL, B[place]LE[place], GREENBEAN, LIMABEAN, WHITEBEAN, BEEFIGE, REDBEET, BLUEBERRY, BROCCOLI, CABBAGE, MELON, CARROT, CASEIN, CASHEWNUT, CAULIFLOWER, CELERY    Note: Lab results reviewed.   [place]  Drug: Mr. Zeiner  reports previous drug use. Alcohol:  reports current alcohol use. Tobacco:  reports that he quit smoking about 3 years ago. He has never used smokeless tobacco. Medical:  has a past medical history of Hepatitis. Family: family history is not on file.  Past Surgical History:  Procedure Laterality Date  . BRAIN SURGERY     [facility] COILING 2017  . JOINT REPLACEMENT Bilateral    TKR  . SHOULDER ARTHROSCOPY WITH ROTATOR CUFF REPAIR AND OPEN BICEPS TENODESIS Left 09/08/2018   Procedure: LEFT SHOULDER ARTHROSCOPY,SUBSACAPULARIS REPAIR, SUBACROMIAL DECOMP, DISTAL CLAVICLE EXCISION,BICEP TENODESIS, MINI OPEN REGENTEN PATCH APPLICATION;  Surgeon: Leim Fabry, MD;  Location: [facility] ORS;  Service: Orthopedics;  Laterality:  Left;   Active Ambulatory Problems    Diagnosis Date Noted  . Chronic bilateral low back pain with bilateral sciatica 05/23/2017  . Acute bursitis of right shoulder 06/14/2017  . Nonallopathic lesion of sacral region 06/14/2017  . Nonallopathic lesion of thoracic region 06/14/2017  . Nonallopathic lesion of lumbosacral region 06/14/2017  . Lumbar facet arthropathy 04/27/2019  . Lumbar degenerative disc disease 04/27/2019  . Localized osteoarthritis of shoulder regions, bilateral 04/27/2019  . Encounter for long-term opiate analgesic use 04/27/2019  . Chronic SI joint pain 04/27/2019  . Chronic pain syndrome 04/27/2019   Resolved Ambulatory Problems    Diagnosis Date Noted  . No Resolved Ambulatory Problems   Past Medical History:  Diagnosis Date  . Hepatitis    Constitutional Exam  General appearance: Well nourished, well developed, and well hydrated. In no apparent acute distress Vitals:   04/27/19 1351  BP: (!) 168/95  Pulse: 85  Resp: 16  Temp: 98.6 F (37 C)  TempSrc: Temporal  SpO2: 98%  Weight: 220 lb (99.8 kg)  Height: 6' (1.829 m)   BMI Assessment: Estimated body  mass index is 29.84 kg/m as calculated from the following:   Height as of this encounter: 6' (1.829 m).   Weight as of this encounter: 220 lb (99.8 kg).  BMI interpretation table: BMI level Category Range association with higher incidence of chronic pain  <18 kg/m2 Underweight   18.5-24.9 kg/m2 Ideal body weight   25-29.9 kg/m2 Overweight Increased incidence by 20%  30-34.9 kg/m2 Obese (Class I) Increased incidence by 68%  35-39.9 kg/m2 Severe obesity (Class II) Increased incidence by 136%  >40 kg/m2 Extreme obesity (Class III) Increased incidence by 254%   Patient's current BMI Ideal Body weight  Body mass index is 29.84 kg/m. Ideal body weight: 77.6 kg (171 lb 1.2 oz) Adjusted ideal body weight: 86.5 kg (190 lb 10.3 oz)   BMI Readings from Last 4 Encounters:  04/27/19 29.84 kg/m  09/08/18  30.52 kg/m  09/04/18 30.52 kg/m  06/14/17 29.02 kg/m   Wt Readings from Last 4 Encounters:  04/27/19 220 lb (99.8 kg)  09/08/18 225 lb (102.1 kg)  09/04/18 225 lb (102.1 kg)  06/14/17 214 lb (97.1 kg)    Psych/Mental status: Alert, oriented x 3 (person, place, & time)       Eyes: PERLA Respiratory: No evidence of acute respiratory distress  Cervical Spine Exam  Skin & Axial Inspection: No masses, redness, edema, swelling, or associated skin lesions Alignment: Symmetrical Functional ROM: Decreased ROM      Stability: No instability detected Muscle Tone/Strength: Functionally intact. No obvious neuro-muscular anomalies detected. Sensory (Neurological): Musculoskeletal pain pattern Palpation: No palpable anomalies              Upper Extremity (UE) Exam    Side: Right upper extremity  Side: Left upper extremity  Skin & Extremity Inspection: Evidence of prior arthroplastic surgery  Skin & Extremity Inspection: Skin color, temperature, and hair growth are WNL. No peripheral edema or cyanosis. No masses, redness, swelling, asymmetry, or associated skin lesions. No contractures.  Functional ROM: Decreased ROM          Functional ROM: Decreased ROM          Muscle Tone/Strength: Functionally intact. No obvious neuro-muscular anomalies detected.  Muscle Tone/Strength: Functionally intact. No obvious neuro-muscular anomalies detected.  Sensory (Neurological): Arthropathic arthralgia          Sensory (Neurological): Arthropathic arthralgia          Palpation: No palpable anomalies              Palpation: No palpable anomalies              Provocative Test(s):  Phalen's test: deferred Tinel's test: deferred Apley's scratch test (touch opposite shoulder):  Action 1 (Across chest): Decreased ROM Action 2 (Overhead): Decreased ROM Action 3 (LB reach): Decreased ROM   Provocative Test(s):  Phalen's test: deferred Tinel's test: deferred Apley's scratch test (touch opposite shoulder):   Action 1 (Across chest): deferred Action 2 (Overhead): deferred Action 3 (LB reach): deferred    Thoracic Spine Area Exam  Skin & Axial Inspection: No masses, redness, or swelling Alignment: Symmetrical Functional ROM: Unrestricted ROM Stability: No instability detected Muscle Tone/Strength: Functionally intact. No obvious neuro-muscular anomalies detected. Sensory (Neurological): Unimpaired Muscle strength & Tone: No palpable anomalies  Lumbar Exam  Skin & Axial Inspection: No masses, redness, or swelling Alignment: Symmetrical Functional ROM: Unrestricted ROM       Stability: No instability detected Muscle Tone/Strength: Functionally intact. No obvious neuro-muscular anomalies detected. Sensory (Neurological): Musculoskeletal pain pattern Palpation: No  palpable anomalies       Provocative Tests: Hyperextension/rotation test: (+) bilaterally for facet joint pain. Lumbar quadrant test (Kemp's test): (+) bilaterally for facet joint pain. Lateral bending test: deferred today       Patrick's Maneuver: (-) for bilateral S-I arthralgia             FABER* test: (-) for bilateral S-I arthralgia             S-I anterior distraction/compression test: deferred today         S-I lateral compression test: deferred today         S-I Thigh-thrust test: deferred today         S-I Gaenslen's test: deferred today         *(Flexion, ABduction and External Rotation)  Gait & Posture Assessment  Ambulation: Unassisted Gait: Relatively normal for age and body habitus Posture: WNL   Lower Extremity Exam    Side: Right lower extremity  Side: Left lower extremity  Stability: No instability observed          Stability: No instability observed          Skin & Extremity Inspection: Skin color, temperature, and hair growth are WNL. No peripheral edema or cyanosis. No masses, redness, swelling, asymmetry, or associated skin lesions. No contractures.  Skin & Extremity Inspection: Skin color,  temperature, and hair growth are WNL. No peripheral edema or cyanosis. No masses, redness, swelling, asymmetry, or associated skin lesions. No contractures.  Functional ROM: Unrestricted ROM                  Functional ROM: Unrestricted ROM                  Muscle Tone/Strength: Functionally intact. No obvious neuro-muscular anomalies detected.  Muscle Tone/Strength: Functionally intact. No obvious neuro-muscular anomalies detected.  Sensory (Neurological): Unimpaired        Sensory (Neurological): Unimpaired        DTR: Patellar: deferred today Achilles: deferred today Plantar: deferred today  DTR: Patellar: deferred today Achilles: deferred today Plantar: deferred today  Palpation: No palpable anomalies  Palpation: No palpable anomalies   Assessment  Primary Diagnosis & Pertinent Problem List: The primary encounter diagnosis was Chronic bilateral low back pain with bilateral sciatica. Diagnoses of Lumbar facet arthropathy, Lumbar degenerative disc disease, Localized osteoarthritis of shoulder regions, bilateral, Encounter for long-term opiate analgesic use, Chronic SI joint pain, and Chronic pain syndrome were also pertinent to this visit.  Visit Diagnosis (New problems to examiner): 1. Chronic bilateral low back pain with bilateral sciatica   2. Lumbar facet arthropathy   3. Lumbar degenerative disc disease   4. Localized osteoarthritis of shoulder regions, bilateral   5. Encounter for long-term opiate analgesic use   6. Chronic SI joint pain   7. Chronic pain syndrome    Plan of Care (Initial workup plan)  Note: Mr. Lantry was reminded that as per protocol, today's visit has been an evaluation only. We have not taken over the patient's controlled substance management.  Patient is a very pleasant 64 year old male who presents with a chief complaint of chronic low back pain as well as occasional numbness and tingling in his bilateral legs.  Patient also has a history of left  shoulder osteoarthritis status post left shoulder arthroscopy and extensive shoulder surgery.  He would like to transition his pain management care to [facility].  He is not pleased with his current pain management physician.  Patient  states that he utilizes oxycodone approximately 5 times a week to help manage his severe breakthrough pain.  The majority of his pain in his lower lumbar spine is worse with prolonged standing and exertion.  Recommend work-up first to consist of imaging studies of lumbar spine, bilateral hips, SI joints for potential referred pain.  Patient has tried and failed gabapentin and Cymbalta in the past.  We discussed Lyrica and titration instructions as below.  Also recommend patient increase his tizanidine from 2 mg nightly as needed to 4 mg nightly as needed.  After patient has completed his imaging studies and when he follows up for his second patient visit, will review treatment plan at that time.  Pending lumbar spine x-rays, may need to consider lumbar spine MRI as patient's previous MRI was over 5 years ago.   Lab Orders     Compliance Drug Analysis, Ur  Imaging Orders     DG HIP UNILAT W OR W/O PELVIS 2-3 VIEWS LEFT     DG HIP UNILAT W OR W/O PELVIS 2-3 VIEWS RIGHT     DG Lumbar Spine Complete W/Bend     DG Si Joints  Pharmacotherapy (current): Medications ordered:  Meds ordered this encounter  Medications  . pregabalin (LYRICA) 25 MG capsule    Sig: Take 1 capsule (25 mg total) by mouth at bedtime for 14 days, THEN 2 capsules (50 mg total) at bedtime for 16 days.    Dispense:  46 capsule    Refill:  0  . tiZANidine (ZANAFLEX) 4 MG tablet    Sig: Take 1 tablet (4 mg total) by mouth at bedtime as needed for muscle spasms.    Dispense:  30 tablet    Refill:  0    Do not place this medication, or any other prescription from our practice, on "Automatic Refill". Patient may have prescription filled one day early if pharmacy is closed on scheduled refill date.    Medications administered during this visit: Homero Fellers "[name]" had no medications administered during this visit.   Pharmacological management options:  Opioid Analgesics: The patient was informed that there is no guarantee that he would be a candidate for opioid analgesics. The decision will be made following CDC guidelines. This decision will be based on the results of diagnostic studies, as well as Mr. Killough's risk profile.   Membrane stabilizer: Tried and failed gabapentin, Cymbalta.  Trial of Lyrica today.  Muscle relaxant: Increase tizanidine from 2 mg to 4 mg nightly.  Has tried and failed Flexeril.  Consider Robaxin in future  NSAID: To be determined at a later time  Other analgesic(s): To be determined at a later time   Interventional management options: Mr. Bremer was informed that there is no guarantee that he would be a candidate for interventional therapies. The decision will be based on the results of diagnostic studies, as well as Mr. Silverthorne's risk profile.  Procedure(s) under consideration:  Lumbar epidural steroid injection Diagnostic lumbar facet medial branch nerve blocks   Provider-requested follow-up: Return in about 2 weeks (around 05/11/2019) for Medication Management, in person, After Imaging.  Future Appointments  Date Time Provider [facility]  05/12/2019  9:30 AM Gillis Santa, MD [facility] None    Note by: Gillis Santa, MD Date: 04/27/2019; Time: 3:51 PM

## 2019-04-27 NOTE — Progress Notes (Signed)
Safety precautions to be maintained throughout the outpatient stay will include: orient to surroundings, keep bed in low position, maintain call bell within reach at all times, provide assistance with transfer out of bed and ambulation.  

## 2019-04-28 ENCOUNTER — Encounter: Payer: Medicare Other | Admitting: Student in an Organized Health Care Education/Training Program

## 2019-04-28 ENCOUNTER — Ambulatory Visit (HOSPITAL_BASED_OUTPATIENT_CLINIC_OR_DEPARTMENT_OTHER): Payer: Medicare Other | Admitting: Student in an Organized Health Care Education/Training Program

## 2019-04-28 VITALS — Ht 72.0 in | Wt 220.0 lb

## 2019-04-28 DIAGNOSIS — G8929 Other chronic pain: Secondary | ICD-10-CM

## 2019-04-28 DIAGNOSIS — M5441 Lumbago with sciatica, right side: Secondary | ICD-10-CM

## 2019-04-28 DIAGNOSIS — M5442 Lumbago with sciatica, left side: Secondary | ICD-10-CM

## 2019-05-02 LAB — COMPLIANCE DRUG ANALYSIS, UR

## 2019-05-12 ENCOUNTER — Encounter: Payer: Medicare Other | Admitting: Student in an Organized Health Care Education/Training Program

## 2019-05-13 ENCOUNTER — Encounter: Payer: Self-pay | Admitting: Student in an Organized Health Care Education/Training Program

## 2019-05-13 ENCOUNTER — Other Ambulatory Visit: Payer: Self-pay

## 2019-05-13 ENCOUNTER — Ambulatory Visit
Payer: Medicare Other | Attending: Student in an Organized Health Care Education/Training Program | Admitting: Student in an Organized Health Care Education/Training Program

## 2019-05-13 VITALS — BP 156/96 | HR 95 | Temp 98.0°F | Resp 18 | Ht 76.0 in | Wt 220.0 lb

## 2019-05-13 DIAGNOSIS — G894 Chronic pain syndrome: Secondary | ICD-10-CM | POA: Diagnosis not present

## 2019-05-13 DIAGNOSIS — M19012 Primary osteoarthritis, left shoulder: Secondary | ICD-10-CM

## 2019-05-13 DIAGNOSIS — M5136 Other intervertebral disc degeneration, lumbar region: Secondary | ICD-10-CM

## 2019-05-13 DIAGNOSIS — M19011 Primary osteoarthritis, right shoulder: Secondary | ICD-10-CM | POA: Diagnosis not present

## 2019-05-13 DIAGNOSIS — M47816 Spondylosis without myelopathy or radiculopathy, lumbar region: Secondary | ICD-10-CM

## 2019-05-13 MED ORDER — PREGABALIN 25 MG PO CAPS: 25.0000 mg | ORAL_CAPSULE | Freq: Every day | ORAL | 0 refills | Status: DC

## 2019-05-13 NOTE — Progress Notes (Signed)
Safety precautions to be maintained throughout the outpatient stay will include: orient to surroundings, keep bed in low position, maintain call bell within reach at all times, provide assistance with transfer out of bed and ambulation.  

## 2019-05-13 NOTE — Progress Notes (Signed)
PROVIDER NOTE: Information contained herein reflects review and annotations entered in association with encounter. Interpretation of such information and data should be left to medically-trained personnel. Information provided to patient can be located elsewhere in the medical record under "Patient Instructions". Document created using STT-dictation technology, any transcriptional errors that may result from process are unintentional.    Patient: Charles Mooney  Service Category: E/M  Provider: Gillis Santa, MD  DOB: August 14, 1955  DOS: 05/13/2019  Referring Provider: Gale Journey, MD  MRN: [id number]  Setting: Ambulatory outpatient  PCP: Gale Journey, MD  Type: Established Patient  Specialty: Interventional Pain Management    Location: Office  Delivery: Face-to-face     Primary Reason(s) for Visit: Encounter for evaluation before starting new chronic pain management plan of care (Level of risk: moderate) CC: Back Pain (low and mid), Wrist Pain (bilateral), Shoulder Pain (right), and Ankle Pain (right)  HPI  Charles Mooney is a 64 y.o. year old, male patient, who comes today for a follow-up evaluation to review the test results and decide on a treatment plan. He has Chronic bilateral low back pain with bilateral sciatica; Acute bursitis of right shoulder; Nonallopathic lesion of sacral region; Nonallopathic lesion of thoracic region; Nonallopathic lesion of lumbosacral region; Lumbar facet arthropathy; Lumbar degenerative disc disease; Localized osteoarthritis of shoulder regions, bilateral; Encounter for long-term opiate analgesic use; Chronic SI joint pain; and Chronic pain syndrome on their problem list. His primarily concern today is the Back Pain (low and mid), Wrist Pain (bilateral), Shoulder Pain (right), and Ankle Pain (right)  Pain Assessment: Location: Lower Back Radiating: left leg, ltaerally Onset: More than a month ago Duration: Chronic pain Quality: Shooting, Tingling Severity: 7  /10 (subjective, self-reported pain score)  Note: Reported level is compatible with observation.                         When using our objective Pain Scale, levels between 6 and 10/10 are said to belong in an emergency room, as it progressively worsens from a 6/10, described as severely limiting, requiring emergency care not usually available at an outpatient pain management facility. At a 6/10 level, communication becomes difficult and requires great effort. Assistance to reach the emergency department may be required. Facial flushing and profuse sweating along with potentially dangerous increases in heart rate and blood pressure will be evident. Effect on ADL:   Timing: Intermittent Modifying factors: stretching, meds, TENS BP: (!) 156/96  HR: 95  2nd pt visit- started on Lyrica 25 mg but is noticing some confusion and sedation with it. Also increased Tizanidine from 2 to 4 mg qhs which does help some. Patient was able to complete his x-ray results as below which we discussed.   Controlled Substance Pharmacotherapy Assessment REMS (Risk Evaluation and Mitigation Strategy)  Analgesic: 05/04/2019  1   05/04/2019  Hydrocodone-Acetamin 10-325 MG  40.00  10 Gr Cri   [id number]   786 (3732)   0  40.00 MME  Comm Ins   [facility]     Pill Count: None expected due to no prior prescriptions written by our practice. Charles Rochester, RN  05/13/2019  2:38 PM  Sign when Signing Visit Safety precautions to be maintained throughout the outpatient stay will include: orient to surroundings, keep bed in low position, maintain call bell within reach at all times, provide assistance with transfer out of bed and ambulation.    Pharmacokinetics: Liberation and absorption (onset of action): WNL Distribution (time  to peak effect): WNL Metabolism and excretion (duration of action): WNL         Pharmacodynamics: Desired effects: Analgesia: Charles Mooney reports >50% benefit. Functional ability: Patient reports that  medication allows him to accomplish basic ADLs Clinically meaningful improvement in function (CMIF): Sustained CMIF goals met Perceived effectiveness: Described as relatively effective, allowing for increase in activities of daily living (ADL) Undesirable effects: Side-effects or Adverse reactions: None reported Monitoring: [place] PMP: PDMP not reviewed this encounter. Online review of the past 38-monthperiod previously conducted. Not applicable at this point since we have not taken over the patient's medication management yet. List of other Serum/Urine Drug Screening Test(s):  No results found for: AMPHSCRSER, BARBSCRSER, BENZOSCRSER, COCAINSCRSER, COCAINSCRNUR, PCPSCRSER, THCSCRSER, THCU, CANNABQUANT, OPIATESCRSER, OXYSCRSER, [place] EKlukwanList of all UDS test(s) done:  Lab Results  Component Value Date   SUMMARY Note 04/28/2019   Last UDS on record: Summary  Date Value Ref Range Status  04/28/2019 Note  Final    Comment:    ==================================================================== Compliance Drug Analysis, Ur ==================================================================== Test                             Result       Flag       Units Drug Present   Hydrocodone                    924                     ng/mg creat   Hydromorphone                  215                     ng/mg creat   Dihydrocodeine                 206                     ng/mg creat   Norhydrocodone                 1363                    ng/mg creat    Sources of hydrocodone include scheduled prescription medications.    Hydromorphone, dihydrocodeine and norhydrocodone are expected    metabolites of hydrocodone. Hydromorphone and dihydrocodeine are    also available as scheduled prescription medications.   Methocarbamol                  PRESENT   Acetaminophen                  PRESENT ==================================================================== Test                      Result    Flag    Units      Ref Range   Creatinine              54               mg/dL      >=20 ==================================================================== Declared Medications:  Medication list was not provided. ==================================================================== For clinical consultation, please call ([phone] ====================================================================    UDS interpretation: No unexpected findings.          Medication Assessment Form: Patient introduced to form today Treatment compliance:  Treatment may start today if patient agrees with proposed plan. Evaluation of compliance is not applicable at this point Risk Assessment Profile: Aberrant behavior: See initial evaluations. None observed or detected today Comorbid factors increasing risk of overdose: See initial evaluation. No additional risks detected today Opioid risk tool (ORT):  Opioid Risk  04/27/2019  Alcohol (No Data)  Illegal Drugs (No Data)  Rx Drugs (No Data)  Alcohol 0  Illegal Drugs 0  Rx Drugs 0  Age between 16-45 years  0  Psychological Disease 0  Depression 0  Opioid Risk Tool Scoring 0  Opioid Risk Interpretation Low Risk    ORT Scoring interpretation table:  Score <3 = Low Risk for SUD  Score between 4-7 = Moderate Risk for SUD  Score >8 = High Risk for Opioid Abuse   Risk of substance use disorder (SUD): Low  Risk Mitigation Strategies:  Patient opioid safety counseling: Completed today. Counseling provided to patient as per "Patient Counseling Document". Document signed by patient, attesting to counseling and understanding Patient-Prescriber Agreement (PPA): Obtained today.  Controlled substance notification to other providers: Written and sent today.  Pharmacologic Plan: Today we may be taking over the patient's pharmacological regimen. See below.             Laboratory Chemistry Profile   Renal Lab Results  Component Value Date   BUN 18 09/04/2018    CREATININE 0.81 09/04/2018   GFRAA >60 09/04/2018   GFRNONAA >60 09/04/2018    Electrolytes Lab Results  Component Value Date   NA 138 09/04/2018   K 3.9 09/04/2018   CL 105 09/04/2018   CALCIUM 9.2 09/04/2018    Hepatic Lab Results  Component Value Date   AST 18 09/04/2018   ALT 20 09/04/2018   ALBUMIN 4.4 09/04/2018   ALKPHOS 39 09/04/2018    ID Lab Results  Component Value Date   SARSCOV2NAA NEGATIVE 09/04/2018    Bone No results found for: [place], [id number], [id number], [id number], 25OHVITD1, 25OHVITD2, 25OHVITD3, TESTOFREE, TESTOSTERONE  Endocrine Lab Results  Component Value Date   GLUCOSE 84 09/04/2018    Neuropathy No results found for: VITAMINB12, FOLATE, HGBA1C, HIV  CNS No results found for: COLORCSF, APPEARCSF, RBCCOUNTCSF, WBCCSF, POLYSCSF, LYMPHSCSF, EOSCSF, PROTEINCSF, GLUCCSF, JCVIRUS, CSFOLI, IGGCSF, LABACHR, ACETBL, LABACHR, ACETBL  Inflammation (CRP: Acute  ESR: Chronic) No results found for: CRP, ESRSEDRATE, LATICACIDVEN  Rheumatology No results found for: RF, ANA, LABURIC, URICUR, LYMEIGGIGMAB, LYMEABIGMQN, HLAB27  Coagulation Lab Results  Component Value Date   PLT 213 09/04/2018    Cardiovascular Lab Results  Component Value Date   HGB 14.9 09/04/2018   HCT 43.8 09/04/2018    Screening Lab Results  Component Value Date   SARSCOV2NAA NEGATIVE 09/04/2018    Cancer No results found for: CEA, CA125, LABCA2  Allergens No results found for: ALMOND, APPLE, ASPARAGUS, AVOCADO, BANANA, BARLEY, BASIL, BAYLE[place], GREENBEAN, LIMABEAN, WHITEBEAN, BEEFIGE, REDBEET, BLUEBERRY, BROCCOLI, CABBAGE, MELON, CARROT, CASEIN, CASHEWNUT, CAULIFLOWER, CELERY    Note: Lab results reviewed.   Recent Diagnostic Imaging Review   Results for orders placed during the hospital encounter of 11/21/17  MR SHOULDER RIGHT WO CONTRAST   Narrative CLINICAL DATA:  Right shoulder pain and limited range of motion for 6 months. No known injury.  EXAM: MRI OF THE RIGHT  SHOULDER WITHOUT CONTRAST  TECHNIQUE: Multiplanar, multisequence MR imaging of the shoulder was performed. No intravenous contrast was administered.  COMPARISON:  None.  FINDINGS: Rotator cuff: There is extensive heterogeneous increased T2 signal and thickening  of the rotator cuff tendons consistent with severe tendinopathy. Tendinopathy is worst in supraspinatus where virtually no normal-appearing tendon is seen.  Muscles: Intermediate intensity edema is seen in the supraspinatus muscle belly and there is very mild atrophy of the supraspinatus.  Biceps long head: There is severe tendinopathy of both the intra and extra-articular segments, worse in the intra-articular segment.  Acromioclavicular Joint: Moderately severe degenerative change is present. Type 1 acromion. There is subacromial spurring. A large volume of fluid is seen in the subacromial/subdeltoid bursa  Glenohumeral Joint: Mild degenerative change is seen. Prominent fluid is noted in the subscapularis recess.  Labrum: The superior labrum is degenerated but no tear is identified.  Bones:  No fracture or focal lesion.  Other: None.  IMPRESSION: Severe rotator cuff tendinopathy is worst in the supraspinatus where virtually no normal-appearing tendon is identified but no focal tear is seen. There is mild atrophy of the supraspinatus muscle belly.  Tendinopathy of the intra and extra-articular long head of biceps is severe in the intra-articular segment.  Moderately severe acromioclavicular osteoarthritis. Subacromial spurring is noted.  Large volume of subacromial/subdeltoid fluid consistent with bursitis.   Electronically Signed   By: Inge Rise M.D.   On: 11/21/2017 09:08     Lumbar DG 2-3 views:  Results for orders placed during the hospital encounter of 05/23/17  DG Lumbar Spine 2-3 Views   Narrative CLINICAL DATA:  Low back pain and left-sided numbness and tingling for 6  weeks.  EXAM: LUMBAR SPINE - 2-3 VIEW  COMPARISON:  None.  FINDINGS: Vertebral body height and alignment are maintained. There is loss of disc space height at L5-S1. Mild to moderate facet degenerative change L4-5 and L5-S1 noted. Paraspinous structures are unremarkable.  IMPRESSION: Mild lower lumbar degenerative disease.  No acute finding.   Electronically Signed   By: Inge Rise M.D.   On: 05/23/2017 16:16           Lumbar DG Bending views:  Results for orders placed during the hospital encounter of 04/27/19  DG Lumbar Spine Complete W/Bend   Narrative CLINICAL DATA:  Chronic low back pain  EXAM: LUMBAR SPINE - COMPLETE WITH BENDING VIEWS  COMPARISON:  None.  FINDINGS: Frontal, neutral lateral, flexion lateral, extension lateral, spot lumbosacral lateral, and bilateral oblique views obtained. There are 5 non-rib-bearing lumbar type vertebral bodies. There is no evident fracture. There is no spondylolisthesis. There is no change in lateral alignment between neutral lateral, flexion lateral, and extension lateral imaging. There is moderately severe disc space narrowing at L5-S1. There is milder disc space narrowing at L3-4 and L4-5. There is facet osteoarthritic change at L3-4, L4-5, and L5-S1 bilaterally.  IMPRESSION: Osteoarthritic change at L3-4, L4-5, L5-S1, most severe at L5-S1. No fracture. No spondylolisthesis. No change in lateral alignment between neutral lateral, flexion lateral, and extension lateral imaging.   Electronically Signed   By: Lowella Grip III M.D.   On: 04/28/2019 08:26    Sacroiliac Joint Imaging: Sacroiliac Joint DG:  Results for orders placed during the hospital encounter of 04/27/19  DG Si Joints   Narrative CLINICAL DATA:  Pain  EXAM: BILATERAL SACROILIAC JOINTS - 3+ VIEW  COMPARISON:  None.  FINDINGS: Angled frontal as well as bilateral oblique views obtained. No fracture or diastasis. No appreciable  arthropathic change in the sacroiliac joints. No erosive change.  IMPRESSION: No fracture or diastases.  No evident arthropathy.   Electronically Signed   By: Lowella Grip III M.D.  On: 04/28/2019 08:27    Hip-R DG 2-3 views:  Results for orders placed during the hospital encounter of 04/27/19  DG HIP UNILAT W OR W/O PELVIS 2-3 VIEWS RIGHT   Narrative CLINICAL DATA:  Chronic right hip pain  EXAM: DG HIP (WITH OR WITHOUT PELVIS) 2-3V RIGHT  COMPARISON:  None.  FINDINGS: No fracture or dislocation is seen.  The joint spaces are preserved.  Visualized bony pelvis appears intact.  Mild degenerative changes of the lower lumbar spine.  IMPRESSION: Negative.   Electronically Signed   By: Julian Hy M.D.   On: 04/28/2019 08:25    Hip-L DG 2-3 views:  Results for orders placed during the hospital encounter of 04/27/19  DG HIP UNILAT W OR W/O PELVIS 2-3 VIEWS LEFT   Narrative CLINICAL DATA:  Chronic left hip pain  EXAM: DG HIP (WITH OR WITHOUT PELVIS) 2-3V LEFT  COMPARISON:  None.  FINDINGS: No fracture or dislocation is seen.  The joint spaces are preserved.  Visualized soft tissues are within normal limits.  Visualized bony pelvis appears intact.  Mild degenerative changes of the lower lumbar spine.  IMPRESSION: Negative.   Electronically Signed   By: Julian Hy M.D.   On: 04/28/2019 08:24      Complexity Note: Imaging results reviewed. Results shared with Mr. Leverette, using Layman's terms.                         Meds   Current Outpatient Medications:  .  b complex vitamins tablet, Take 1 tablet by mouth daily., Disp: , Rfl:  .  cholecalciferol (VITAMIN D3) 25 MCG (1000 UT) tablet, Take 1,000 Units by mouth daily., Disp: , Rfl:  .  HYDROcodone-acetaminophen (NORCO) 10-325 MG tablet, Take 1 tablet by mouth daily., Disp: , Rfl:  .  pregabalin (LYRICA) 25 MG capsule, Take 1 capsule (25 mg total) by mouth at bedtime., Disp: 30  capsule, Rfl: 0 .  tiZANidine (ZANAFLEX) 4 MG tablet, Take 1 tablet (4 mg total) by mouth at bedtime as needed for muscle spasms., Disp: 30 tablet, Rfl: 0 .  Turmeric 500 MG CAPS, Take 500 mg by mouth daily., Disp: , Rfl:   ROS  Constitutional: Denies any fever or chills Gastrointestinal: No reported hemesis, hematochezia, vomiting, or acute GI distress Musculoskeletal: Denies any acute onset joint swelling, redness, loss of ROM, or weakness Neurological: No reported episodes of acute onset apraxia, aphasia, dysarthria, agnosia, amnesia, paralysis, loss of coordination, or loss of consciousness  Allergies  Mr. Soberano is allergic to sulfa antibiotics.  [place]  Drug: Mr. Wulff  reports previous drug use. Alcohol:  reports current alcohol use. Tobacco:  reports that he quit smoking about 3 years ago. He has never used smokeless tobacco. Medical:  has a past medical history of Hepatitis. Surgical: Mr. Vonbargen  has a past surgical history that includes Brain surgery; Joint replacement (Bilateral); and Shoulder arthroscopy with rotator cuff repair and open biceps tenodesis (Left, 09/08/2018). Family: family history is not on file.  Constitutional Exam  General appearance: Well nourished, well developed, and well hydrated. In no apparent acute distress Vitals:   05/13/19 1435  BP: (!) 156/96  Pulse: 95  Resp: 18  Temp: 98 F (36.7 C)  TempSrc: Oral  SpO2: 95%  Weight: 220 lb (99.8 kg)  Height: _0  (1.93 m)   BMI Assessment: Estimated body mass index is 26.78 kg/m as calculated from the following:   Height as of  this encounter: _0  (1.93 m).   Weight as of this encounter: 220 lb (99.8 kg).  BMI interpretation table: BMI level Category Range association with higher incidence of chronic pain  <18 kg/m2 Underweight   18.5-24.9 kg/m2 Ideal body weight   25-29.9 kg/m2 Overweight Increased incidence by 20%  30-34.9 kg/m2 Obese (Class I) Increased incidence by 68%  35-39.9 kg/m2  Severe obesity (Class II) Increased incidence by 136%  >40 kg/m2 Extreme obesity (Class III) Increased incidence by 254%   Patient's current BMI Ideal Body weight  Body mass index is 26.78 kg/m. Ideal body weight: 86.8 kg (191 lb 5.7 oz) Adjusted ideal body weight: 92 kg (202 lb 13 oz)   BMI Readings from Last 4 Encounters:  05/13/19 26.78 kg/m  04/27/19 29.84 kg/m  04/27/19 29.84 kg/m  09/08/18 30.52 kg/m   Wt Readings from Last 4 Encounters:  05/13/19 220 lb (99.8 kg)  04/27/19 220 lb (99.8 kg)  04/27/19 220 lb (99.8 kg)  09/08/18 225 lb (102.1 kg)    Psych/Mental status: Alert, oriented x 3 (person, place, & time)       Eyes: PERLA Respiratory: No evidence of acute respiratory distress  Cervical Spine Exam  Skin & Axial Inspection: No masses, redness, edema, swelling, or associated skin lesions Alignment: Symmetrical Functional ROM: Unrestricted ROM      Stability: No instability detected Muscle Tone/Strength: Functionally intact. No obvious neuro-muscular anomalies detected. Sensory (Neurological): Unimpaired Palpation: No palpable anomalies              Upper Extremity (UE) Exam    Side: Right upper extremity  Side: Left upper extremity  Skin & Extremity Inspection: Skin color, temperature, and hair growth are WNL. No peripheral edema or cyanosis. No masses, redness, swelling, asymmetry, or associated skin lesions. No contractures.  Skin & Extremity Inspection: Skin color, temperature, and hair growth are WNL. No peripheral edema or cyanosis. No masses, redness, swelling, asymmetry, or associated skin lesions. No contractures.  Functional ROM: Unrestricted ROM          Functional ROM: Unrestricted ROM          Muscle Tone/Strength: Functionally intact. No obvious neuro-muscular anomalies detected.  Muscle Tone/Strength: Functionally intact. No obvious neuro-muscular anomalies detected.  Sensory (Neurological): Unimpaired          Sensory (Neurological): Unimpaired           Palpation: No palpable anomalies              Palpation: No palpable anomalies              Provocative Test(s):  Phalen's test: deferred Tinel's test: deferred Apley's scratch test (touch opposite shoulder):  Action 1 (Across chest): deferred Action 2 (Overhead): deferred Action 3 (LB reach): deferred   Provocative Test(s):  Phalen's test: deferred Tinel's test: deferred Apley's scratch test (touch opposite shoulder):  Action 1 (Across chest): deferred Action 2 (Overhead): deferred Action 3 (LB reach): deferred    Thoracic Spine Area Exam  Skin & Axial Inspection: No masses, redness, or swelling Alignment: Symmetrical Functional ROM: Unrestricted ROM Stability: No instability detected Muscle Tone/Strength: Functionally intact. No obvious neuro-muscular anomalies detected. Sensory (Neurological): Unimpaired Muscle strength & Tone: No palpable anomalies  Lumbar Exam  Skin & Axial Inspection: No masses, redness, or swelling Alignment: Symmetrical Functional ROM: Decreased ROM       Stability: No instability detected Muscle Tone/Strength: Functionally intact. No obvious neuro-muscular anomalies detected. Sensory (Neurological): Musculoskeletal pain pattern Palpation: No palpable anomalies  Provocative Tests: Hyperextension/rotation test: (+) bilaterally for facet joint pain. Lumbar quadrant test (Kemp's test): (+) bilaterally for facet joint pain. Lateral bending test: deferred today       Patrick's Maneuver: deferred today                   FABER* test: deferred today                   S-I anterior distraction/compression test: deferred today         S-I lateral compression test: deferred today         S-I Thigh-thrust test: deferred today         S-I Gaenslen's test: deferred today         *(Flexion, ABduction and External Rotation)  Gait & Posture Assessment  Ambulation: Unassisted Gait: Relatively normal for age and body habitus Posture: WNL   Lower  Extremity Exam    Side: Right lower extremity  Side: Left lower extremity  Stability: No instability observed          Stability: No instability observed          Skin & Extremity Inspection: Skin color, temperature, and hair growth are WNL. No peripheral edema or cyanosis. No masses, redness, swelling, asymmetry, or associated skin lesions. No contractures.  Skin & Extremity Inspection: Skin color, temperature, and hair growth are WNL. No peripheral edema or cyanosis. No masses, redness, swelling, asymmetry, or associated skin lesions. No contractures.  Functional ROM: Unrestricted ROM                  Functional ROM: Unrestricted ROM                  Muscle Tone/Strength: Functionally intact. No obvious neuro-muscular anomalies detected.  Muscle Tone/Strength: Functionally intact. No obvious neuro-muscular anomalies detected.  Sensory (Neurological): Unimpaired        Sensory (Neurological): Unimpaired        DTR: Patellar: deferred today Achilles: deferred today Plantar: deferred today  DTR: Patellar: deferred today Achilles: deferred today Plantar: deferred today  Palpation: No palpable anomalies  Palpation: No palpable anomalies   Assessment & Plan  Primary Diagnosis & Pertinent Problem List: The primary encounter diagnosis was Lumbar facet arthropathy. Diagnoses of Lumbar degenerative disc disease, Localized osteoarthritis of shoulder regions, bilateral, and Chronic pain syndrome were also pertinent to this visit.  Visit Diagnosis: 1. Lumbar facet arthropathy   2. Lumbar degenerative disc disease   3. Localized osteoarthritis of shoulder regions, bilateral   4. Chronic pain syndrome    DONTAI PEMBER has a history of greater than 3 months of moderate to severe pain which is resulted in functional impairment.  The patient has tried various conservative therapeutic options such as NSAIDs, Tylenol, muscle relaxants, physical therapy which was inadequately effective.  Patient's pain is  predominantly axial with physical exam findings suggestive of facet arthropathy. Lumbar facet medial branch nerve blocks were discussed with the patient.  Risks and benefits were reviewed.  Patient would like to proceed with bilateral L3, L4, L5, S1 medial branch nerve block.  In regards to medication management, recommend that he should continue his Lyrica dose 25 mg nightly and not increase since he is having some side effects of confusion.  Recommend that he continue his tizanidine and okay to take 6 mg at night when he is having severe muscle spasms.  Patient's UDS appropriate.  We will have patient sign pain contract and can take  over his chronic opioid therapy at next visit.  1.  Lumbar facet medial branch nerve blocks as below for lumbar facet arthropathy 2.  Continue Lyrica 25 mg nightly 3.  Continue tizanidine, 4 to 6 mg nightly 3.  Sign opioid contract to take over hydrocodone at next visit to consider alternatives as well.   Orders Placed This Encounter  Procedures  . LUMBAR FACET(MEDIAL BRANCH NERVE BLOCK) MBNB    Standing Status:   Future    Standing Expiration Date:   06/13/2019    Scheduling Instructions:     Procedure: Lumbar facet block (AKA.: Lumbosacral medial branch nerve block)     Side: Bilateral     Level: L3-4, L4-5, & L5-S1 Facets (L3, L4, L5, & S1 Medial Branch Nerves)     Sedation: with     Timeframe: ASAA    Order Specific Question:   Where will this procedure be performed?    Answer:   [facility]    Plan of Care  Pharmacotherapy (Medications Ordered): Meds ordered this encounter  Medications  . pregabalin (LYRICA) 25 MG capsule    Sig: Take 1 capsule (25 mg total) by mouth at bedtime.    Dispense:  30 capsule    Refill:  0    Procedure Orders     LUMBAR FACET(MEDIAL BRANCH NERVE BLOCK) MBNB    Provider-requested follow-up: Return in about 1 week (around 05/20/2019) for B/L L3, 4, 5 S1 Fcts #1 , with sedation. Recent Visits Date Type  Provider Dept  04/27/19 Office Visit Gillis Santa, MD [facility]  Showing recent visits within past 90 days and meeting all other requirements   Today's Visits Date Type Provider Dept  05/13/19 Office Visit Gillis Santa, MD [facility]  Showing today's visits and meeting all other requirements   Future Appointments Date Type Provider Dept  06/01/19 Appointment Gillis Santa, MD [facility]  Showing future appointments within next 90 days and meeting all other requirements   Primary Care Physician: Gale Journey, MD Note by: Gillis Santa, MD Date: 05/13/2019; Time: 3:42 PM

## 2019-05-13 NOTE — Patient Instructions (Signed)
Sign pain contract  Schedule for lumbar facets next week

## 2019-06-01 ENCOUNTER — Encounter: Payer: Self-pay | Admitting: Student in an Organized Health Care Education/Training Program

## 2019-06-01 ENCOUNTER — Ambulatory Visit (HOSPITAL_BASED_OUTPATIENT_CLINIC_OR_DEPARTMENT_OTHER): Payer: Medicare Other | Admitting: Student in an Organized Health Care Education/Training Program

## 2019-06-01 ENCOUNTER — Ambulatory Visit
Admission: RE | Admit: 2019-06-01 | Discharge: 2019-06-01 | Disposition: A | Discharge status: Home / Self Care | Payer: Medicare Other | Source: Ambulatory Visit | Attending: Student in an Organized Health Care Education/Training Program | Admitting: Student in an Organized Health Care Education/Training Program

## 2019-06-01 ENCOUNTER — Other Ambulatory Visit: Payer: Self-pay

## 2019-06-01 VITALS — BP 130/85 | HR 72 | Temp 97.2°F | Resp 16 | Ht 72.0 in | Wt 220.0 lb

## 2019-06-01 DIAGNOSIS — G894 Chronic pain syndrome: Secondary | ICD-10-CM | POA: Insufficient documentation

## 2019-06-01 DIAGNOSIS — M47816 Spondylosis without myelopathy or radiculopathy, lumbar region: Secondary | ICD-10-CM | POA: Insufficient documentation

## 2019-06-01 MED ORDER — LIDOCAINE HCL 2 % IJ SOLN
INTRAMUSCULAR | Status: AC
  Filled 2019-06-01: qty 20

## 2019-06-01 MED ORDER — DEXAMETHASONE SODIUM PHOSPHATE 10 MG/ML IJ SOLN
INTRAMUSCULAR | Status: AC
  Filled 2019-06-01: qty 2

## 2019-06-01 MED ORDER — ROPIVACAINE HCL 2 MG/ML IJ SOLN
9.0000 mL | Freq: Once | INTRAMUSCULAR | Status: AC
  Administered 2019-06-01: 9 mL via PERINEURAL

## 2019-06-01 MED ORDER — TIZANIDINE HCL 4 MG PO TABS: 4.0000 mg | ORAL_TABLET | Freq: Every evening | ORAL | 2 refills | Status: DC | PRN

## 2019-06-01 MED ORDER — FENTANYL CITRATE (PF) 100 MCG/2ML IJ SOLN
25.0000 ug | INTRAMUSCULAR | Status: AC | PRN
  Administered 2019-06-01 (×3): 25 ug via INTRAVENOUS

## 2019-06-01 MED ORDER — PREGABALIN 25 MG PO CAPS: 25.0000 mg | ORAL_CAPSULE | Freq: Every day | ORAL | 0 refills | Status: DC

## 2019-06-01 MED ORDER — ROPIVACAINE HCL 2 MG/ML IJ SOLN
INTRAMUSCULAR | Status: AC
  Filled 2019-06-01: qty 20

## 2019-06-01 MED ORDER — HYDROCODONE-ACETAMINOPHEN 10-325 MG PO TABS: 1.0000 | ORAL_TABLET | Freq: Every day | ORAL | 0 refills | Status: DC | PRN

## 2019-06-01 MED ORDER — LIDOCAINE HCL 2 % IJ SOLN
20.0000 mL | Freq: Once | INTRAMUSCULAR | Status: AC
  Administered 2019-06-01: 09:00:00 400 mg

## 2019-06-01 MED ORDER — DEXAMETHASONE SODIUM PHOSPHATE 10 MG/ML IJ SOLN
10.0000 mg | Freq: Once | INTRAMUSCULAR | Status: AC
  Administered 2019-06-01: 10 mg

## 2019-06-01 MED ORDER — FENTANYL CITRATE (PF) 100 MCG/2ML IJ SOLN
INTRAMUSCULAR | Status: AC
  Filled 2019-06-01: qty 2

## 2019-06-01 NOTE — Patient Instructions (Signed)

## 2019-06-01 NOTE — Progress Notes (Signed)
PROVIDER NOTE: Information contained herein reflects review and annotations entered in association with encounter. Interpretation of such information and data should be left to medically-trained personnel. Information provided to patient can be located elsewhere in the medical record under "Patient Instructions". Document created using STT-dictation technology, any transcriptional errors that may result from process are unintentional.    Patient: Charles Mooney  Service Category: Procedure  Provider: Edward Jolly, MD  DOB: [birth year]  DOS: 06/01/2019  Location: [facility]  MRN: [id number]  Setting: Ambulatory - outpatient  Referring Provider: Tiana Loft, MD  Type: Established Patient  Specialty: Interventional Pain Management  PCP: Tiana Loft, MD   Primary Reason for Visit: Interventional Pain Management Treatment. CC: Back Pain (lower)  Procedure:          Anesthesia, Analgesia, Anxiolysis:  Type: Lumbar Facet, Medial Branch Block(s) #1  Primary Purpose: Diagnostic Region: Posterolateral Lumbosacral Spine Level: L3, L4, L5, & S1 Medial Branch Level(s). Injecting these levels blocks the L3-4, L4-5, and L5-S1 lumbar facet joints. Laterality: Bilateral  Type: Moderate (Conscious) Sedation combined with Local Anesthesia Indication(s): Analgesia and Anxiety Route: Intravenous (IV) IV Access: Secured Sedation: Meaningful verbal contact was maintained at all times during the procedure  Local Anesthetic: Lidocaine 1-2%  Position: Prone   Indications: 1. Lumbar facet arthropathy   2. Chronic pain syndrome    Pain Score: Pre-procedure: 7 /10 Post-procedure: 3 /10   Pre-op Assessment:  Mr. Pentecost is a 64 y.o. (year old), male patient, seen today for interventional treatment. He  has a past surgical history that includes Brain surgery; Joint replacement (Bilateral); and Shoulder arthroscopy with rotator cuff repair and open biceps tenodesis (Left, 09/08/2018). Mr.  Bozzo has a current medication list which includes the following prescription(s): b complex vitamins, cholecalciferol, hydrocodone-acetaminophen, pregabalin, turmeric, and tizanidine. His primarily concern today is the Back Pain (lower)  Initial Vital Signs:  Pulse/HCG Rate: 77  Temp: (!) 97.1 F (36.2 C) Resp: 16 BP: 134/90 SpO2: 100 %  BMI: Estimated body mass index is 29.84 kg/m as calculated from the following:   Height as of this encounter: 6' (1.829 m).   Weight as of this encounter: 220 lb (99.8 kg).  Risk Assessment: Allergies: Reviewed. He is allergic to sulfa antibiotics.  Allergy Precautions: None required Coagulopathies: Reviewed. None identified.  Blood-thinner therapy: None at this time Active Infection(s): Reviewed. None identified. Mr. Broad is afebrile  Site Confirmation: Mr. Stigger was asked to confirm the procedure and laterality before marking the site Procedure checklist: Completed Consent: Before the procedure and under the influence of no sedative(s), amnesic(s), or anxiolytics, the patient was informed of the treatment options, risks and possible complications. To fulfill our ethical and legal obligations, as recommended by the American Medical Association's Code of Ethics, I have informed the patient of my clinical impression; the nature and purpose of the treatment or procedure; the risks, benefits, and possible complications of the intervention; the alternatives, including doing nothing; the risk(s) and benefit(s) of the alternative treatment(s) or procedure(s); and the risk(s) and benefit(s) of doing nothing. The patient was provided information about the general risks and possible complications associated with the procedure. These may include, but are not limited to: failure to achieve desired goals, infection, bleeding, organ or nerve damage, allergic reactions, paralysis, and death. In addition, the patient was informed of those risks and complications  associated to Spine-related procedures, such as failure to decrease pain; infection (i.e.: Meningitis, epidural or intraspinal abscess); bleeding (i.e.: epidural hematoma, subarachnoid  hemorrhage, or any other type of intraspinal or peri-dural bleeding); organ or nerve damage (i.e.: Any type of peripheral nerve, nerve root, or spinal cord injury) with subsequent damage to sensory, motor, and/or autonomic systems, resulting in permanent pain, numbness, and/or weakness of one or several areas of the body; allergic reactions; (i.e.: anaphylactic reaction); and/or death. Furthermore, the patient was informed of those risks and complications associated with the medications. These include, but are not limited to: allergic reactions (i.e.: anaphylactic or anaphylactoid reaction(s)); adrenal axis suppression; blood sugar elevation that in diabetics may result in ketoacidosis or comma; water retention that in patients with history of congestive heart failure may result in shortness of breath, pulmonary edema, and decompensation with resultant heart failure; weight gain; swelling or edema; medication-induced neural toxicity; particulate matter embolism and blood vessel occlusion with resultant organ, and/or nervous system infarction; and/or aseptic necrosis of one or more joints. Finally, the patient was informed that Medicine is not an exact science; therefore, there is also the possibility of unforeseen or unpredictable risks and/or possible complications that may result in a catastrophic outcome. The patient indicated having understood very clearly. We have given the patient no guarantees and we have made no promises. Enough time was given to the patient to ask questions, all of which were answered to the patient's satisfaction. Mr. Disney has indicated that he wanted to continue with the procedure. Attestation: I, the ordering provider, attest that I have discussed with the patient the benefits, risks, side-effects,  alternatives, likelihood of achieving goals, and potential problems during recovery for the procedure that I have provided informed consent. Date  Time: 06/01/2019  8:44 AM  Pre-Procedure Preparation:  Monitoring: As per clinic protocol. Respiration, ETCO2, SpO2, BP, heart rate and rhythm monitor placed and checked for adequate function Safety Precautions: Patient was assessed for positional comfort and pressure points before starting the procedure. Time-out: I initiated and conducted the "Time-out" before starting the procedure, as per protocol. The patient was asked to participate by confirming the accuracy of the "Time Out" information. Verification of the correct person, site, and procedure were performed and confirmed by me, the nursing staff, and the patient. "Time-out" conducted as per Joint Commission's Universal Protocol (UP.01.01.01). Time: 0927  Description of Procedure:          Laterality: Bilateral. The procedure was performed in identical fashion on both sides. Levels:  L3, L4, L5, & S1 Medial Branch Level(s) Area Prepped: Posterior Lumbosacral Region DuraPrep (Iodine Povacrylex [0.7% available iodine] and Isopropyl Alcohol, 74% w/w) Safety Precautions: Aspiration looking for blood return was conducted prior to all injections. At no point did we inject any substances, as a needle was being advanced. Before injecting, the patient was told to immediately notify me if he was experiencing any new onset of "ringing in the ears, or metallic taste in the mouth". No attempts were made at seeking any paresthesias. Safe injection practices and needle disposal techniques used. Medications properly checked for expiration dates. SDV (single dose vial) medications used. After the completion of the procedure, all disposable equipment used was discarded in the proper designated medical waste containers. Local Anesthesia: Protocol guidelines were followed. The patient was positioned over the fluoroscopy  table. The area was prepped in the usual manner. The time-out was completed. The target area was identified using fluoroscopy. A 12-in long, straight, sterile hemostat was used with fluoroscopic guidance to locate the targets for each level blocked. Once located, the skin was marked with an approved surgical skin marker.  Once all sites were marked, the skin (epidermis, dermis, and hypodermis), as well as deeper tissues (fat, connective tissue and muscle) were infiltrated with a small amount of a short-acting local anesthetic, loaded on a 10cc syringe with a 25G, 1.5-in  Needle. An appropriate amount of time was allowed for local anesthetics to take effect before proceeding to the next step. Local Anesthetic: Lidocaine 2.0% The unused portion of the local anesthetic was discarded in the proper designated containers. Technical explanation of process:   L3 Medial Branch Nerve Block (MBB): The target area for the L3 medial branch is at the junction of the postero-lateral aspect of the superior articular process and the superior, posterior, and medial edge of the transverse process of L4. Under fluoroscopic guidance, a Quincke needle was inserted until contact was made with os over the superior postero-lateral aspect of the pedicular shadow (target area). After negative aspiration for blood, 1mL of the nerve block solution was injected without difficulty or complication. The needle was removed intact. L4 Medial Branch Nerve Block (MBB): The target area for the L4 medial branch is at the junction of the postero-lateral aspect of the superior articular process and the superior, posterior, and medial edge of the transverse process of L5. Under fluoroscopic guidance, a Quincke needle was inserted until contact was made with os over the superior postero-lateral aspect of the pedicular shadow (target area). After negative aspiration for blood,1 mL of the nerve block solution was injected without difficulty or  complication. The needle was removed intact. L5 Medial Branch Nerve Block (MBB): The target area for the L5 medial branch is at the junction of the postero-lateral aspect of the superior articular process and the superior, posterior, and medial edge of the sacral ala. Under fluoroscopic guidance, a Quincke needle was inserted until contact was made with os over the superior postero-lateral aspect of the pedicular shadow (target area). After negative aspiration for blood, 1mL of the nerve block solution was injected without difficulty or complication. The needle was removed intact. S1 Medial Branch Nerve Block (MBB): The target area for the S1 medial branch is at the posterior and inferior 6 o'clock position of the L5-S1 facet joint. Under fluoroscopic guidance, the Quincke needle inserted for the L5 MBB was redirected until contact was made with os over the inferior and postero aspect of the sacrum, at the 6 o' clock position under the L5-S1 facet joint (Target area). After negative aspiration for blood, 1mL of the nerve block solution was injected without difficulty or complication. The needle was removed intact.  Nerve block solution: 10 cc solution made of 8 cc of 0.2% ropivacaine, 2 cc of Decadron 10 mg/cc.  1 to 1.5 cc injected at each level above bilaterally.  The unused portion of the solution was discarded in the proper designated containers. Procedural Needles: 22-gauge, 3.5-inch, Quincke needles used for all levels.  Once the entire procedure was completed, the treated area was cleaned, making sure to leave some of the prepping solution back to take advantage of its long term bactericidal properties.   Illustration of the posterior view of the lumbar spine and the posterior neural structures. Laminae of L2 through S1 are labeled. DPRL5, dorsal primary ramus of L5; DPRS1, dorsal primary ramus of S1; DPR3, dorsal primary ramus of L3; FJ, facet (zygapophyseal) joint L3-L4; I, inferior articular  process of L4; LB1, lateral branch of dorsal primary ramus of L1; IAB, inferior articular branches from L3 medial branch (supplies L4-L5 facet joint); IBP, intermediate branch  plexus; MB3, medial branch of dorsal primary ramus of L3; NR3, third lumbar nerve root; S, superior articular process of L5; SAB, superior articular branches from L4 (supplies L4-5 facet joint also); TP3, transverse process of L3.  Vitals:   06/01/19 0925 06/01/19 0930 06/01/19 0935 06/01/19 0940  BP: (!) 133/91 131/89 130/90 (!) 150/84  Pulse: (!) 59 62 (!) 59 72  Resp: 18 12 13 15   Temp:      TempSrc:      SpO2: 96% 97% 95% 96%  Weight:      Height:         Start Time: 0927 hrs. End Time: 0940 hrs.  Imaging Guidance (Spinal):          Type of Imaging Technique: Fluoroscopy Guidance (Spinal) Indication(s): Assistance in needle guidance and placement for procedures requiring needle placement in or near specific anatomical locations not easily accessible without such assistance. Exposure Time: Please see nurses notes. Contrast: None used. Fluoroscopic Guidance: I was personally present during the use of fluoroscopy. "Tunnel Vision Technique" used to obtain the best possible view of the target area. Parallax error corrected before commencing the procedure. "Direction-depth-direction" technique used to introduce the needle under continuous pulsed fluoroscopy. Once target was reached, antero-posterior, oblique, and lateral fluoroscopic projection used confirm needle placement in all planes. Images permanently stored in EMR. Interpretation: No contrast injected. I personally interpreted the imaging intraoperatively. Adequate needle placement confirmed in multiple planes. Permanent images saved into the patient's record.  Antibiotic Prophylaxis:   Anti-infectives (From admission, onward)   None     Indication(s): None identified  Post-operative Assessment:  Post-procedure Vital Signs:  Pulse/HCG Rate: 72  Temp:  (!) 97.1 F (36.2 C) Resp: 15 BP: (!) 150/84 SpO2: 96 %  EBL: None  Complications: No immediate post-treatment complications observed by team, or reported by patient.  Note: The patient tolerated the entire procedure well. A repeat set of vitals were taken after the procedure and the patient was kept under observation following institutional policy, for this type of procedure. Post-procedural neurological assessment was performed, showing return to baseline, prior to discharge. The patient was provided with post-procedure discharge instructions, including a section on how to identify potential problems. Should any problems arise concerning this procedure, the patient was given instructions to immediately contact , at any time, without hesitation. In any case, we plan to contact the patient by telephone for a follow-up status report regarding this interventional procedure.  Comments:  No additional relevant information.  Plan of Care  Orders:  Orders Placed This Encounter  Procedures  . [facility] C-ARM 1-60 MIN NO REPORT    Intraoperative interpretation by procedural physician at [facility].    Standing Status:   Standing    Number of Occurrences:   1    Order Specific Question:   Reason for exam:    Answer:   Assistance in needle guidance and placement for procedures requiring needle placement in or near specific anatomical locations not easily accessible without such assistance.    Medications ordered for procedure: Meds ordered this encounter  Medications  . lidocaine (XYLOCAINE) 2 % (with pres) injection 400 mg  . fentaNYL (SUBLIMAZE) injection 25-50 mcg    Make sure Narcan is available in the pyxis when using this medication. In the event of respiratory depression (RR< 8/min): Titrate NARCAN (naloxone) in increments of 0.1 to 0.2 mg IV at 2-3 minute intervals, until desired degree of reversal.  . dexamethasone (DECADRON) injection 10  mg  . dexamethasone  (DECADRON) injection 10 mg  . ropivacaine (PF) 2 mg/mL (0.2%) (NAROPIN) injection 9 mL  . ropivacaine (PF) 2 mg/mL (0.2%) (NAROPIN) injection 9 mL  . HYDROcodone-acetaminophen (NORCO) 10-325 MG tablet    Sig: Take 1 tablet by mouth daily as needed.    Dispense:  30 tablet    Refill:  0  . pregabalin (LYRICA) 25 MG capsule    Sig: Take 1 capsule (25 mg total) by mouth at bedtime.    Dispense:  90 capsule    Refill:  0  . tiZANidine (ZANAFLEX) 4 MG tablet    Sig: Take 1 tablet (4 mg total) by mouth at bedtime as needed for muscle spasms.    Dispense:  90 tablet    Refill:  2   Medications administered: We administered lidocaine, fentaNYL, dexamethasone, dexamethasone, ropivacaine (PF) 2 mg/mL (0.2%), and ropivacaine (PF) 2 mg/mL (0.2%).  See the medical record for exact dosing, route, and time of administration.  Follow-up plan:   Return in about 5 weeks (around 07/06/2019) for Post Procedure Evaluation, Medication Management.      Status post bilateral L3, L4, L5, S1 facet  medial branch nerve blocks #1 on 06/01/2019   Recent Visits Date Type Provider Dept  05/13/19 Office Visit Gillis Santa, MD [facility]  04/27/19 Office Visit Gillis Santa, MD [facility]  Showing recent visits within past 90 days and meeting all other requirements   Today's Visits Date Type Provider Dept  06/01/19 Procedure visit Gillis Santa, MD [facility]  Showing today's visits and meeting all other requirements   Future Appointments No visits were found meeting these conditions.  Showing future appointments within next 90 days and meeting all other requirements   Disposition: Discharge home  Discharge (Date  Time): 06/01/2019;   hrs.   Primary Care Physician: Gale Journey, MD Location: [facility] Note by: Gillis Santa, MD Date: 06/01/2019; Time: 9:45 AM  Disclaimer:  Medicine is not an exact science. The only guarantee in medicine  is that nothing is guaranteed. It is important to note that the decision to proceed with this intervention was based on the information collected from the patient. The Data and conclusions were drawn from the patient's questionnaire, the interview, and the physical examination. Because the information was provided in large part by the patient, it cannot be guaranteed that it has not been purposely or unconsciously manipulated. Every effort has been made to obtain as much relevant data as possible for this evaluation. It is important to note that the conclusions that lead to this procedure are derived in large part from the available data. Always take into account that the treatment will also be dependent on availability of resources and existing treatment guidelines, considered by other Pain Management Practitioners as being common knowledge and practice, at the time of the intervention. For Medico-Legal purposes, it is also important to point out that variation in procedural techniques and pharmacological choices are the acceptable norm. The indications, contraindications, technique, and results of the above procedure should only be interpreted and judged by a Board-Certified Interventional Pain Specialist with extensive familiarity and expertise in the same exact procedure and technique.

## 2019-06-01 NOTE — Progress Notes (Signed)
Safety precautions to be maintained throughout the outpatient stay will include: orient to surroundings, keep bed in low position, maintain call bell within reach at all times, provide assistance with transfer out of bed and ambulation.  

## 2019-06-02 ENCOUNTER — Telehealth: Payer: Self-pay | Admitting: *Deleted

## 2019-06-02 NOTE — Telephone Encounter (Signed)
No problems post procedure. 

## 2019-06-09 ENCOUNTER — Encounter: Payer: Self-pay | Admitting: Student in an Organized Health Care Education/Training Program

## 2019-06-11 ENCOUNTER — Other Ambulatory Visit: Payer: Self-pay | Admitting: Student in an Organized Health Care Education/Training Program

## 2019-06-11 DIAGNOSIS — G894 Chronic pain syndrome: Secondary | ICD-10-CM

## 2019-06-11 MED ORDER — METHOCARBAMOL 750 MG PO TABS: 750.0000 mg | ORAL_TABLET | Freq: Three times a day (TID) | ORAL | 0 refills | Status: DC | PRN

## 2019-06-11 NOTE — Progress Notes (Signed)
If Zanaflex is not beneficial, can try Robaxin. Rx sent in.

## 2019-06-15 ENCOUNTER — Other Ambulatory Visit: Payer: Self-pay | Admitting: Student in an Organized Health Care Education/Training Program

## 2019-06-15 ENCOUNTER — Encounter: Payer: Self-pay | Admitting: *Deleted

## 2019-07-06 ENCOUNTER — Telehealth: Payer: Self-pay

## 2019-07-06 ENCOUNTER — Encounter: Payer: Self-pay | Admitting: Student in an Organized Health Care Education/Training Program

## 2019-07-06 ENCOUNTER — Telehealth: Payer: Self-pay | Admitting: Student in an Organized Health Care Education/Training Program

## 2019-07-06 NOTE — Telephone Encounter (Signed)
Patient returning nurse call for 5-18 appt chart update

## 2019-07-06 NOTE — Telephone Encounter (Signed)
Attempted to call pt back for VV . Phone automatically went to voicemail.

## 2019-07-06 NOTE — Telephone Encounter (Signed)
Attempted to call patient back and phone automatically went to voicemail.

## 2019-07-06 NOTE — Telephone Encounter (Signed)
Attempted to call patient. No answer. Instructed to call our office so we could review his procedure.

## 2019-07-07 ENCOUNTER — Other Ambulatory Visit: Payer: Self-pay

## 2019-07-07 ENCOUNTER — Encounter: Payer: Self-pay | Admitting: Student in an Organized Health Care Education/Training Program

## 2019-07-07 ENCOUNTER — Ambulatory Visit
Payer: Medicare Other | Attending: Student in an Organized Health Care Education/Training Program | Admitting: Student in an Organized Health Care Education/Training Program

## 2019-07-07 VITALS — Wt 220.0 lb

## 2019-07-07 DIAGNOSIS — G894 Chronic pain syndrome: Secondary | ICD-10-CM

## 2019-07-07 DIAGNOSIS — M19011 Primary osteoarthritis, right shoulder: Secondary | ICD-10-CM | POA: Diagnosis not present

## 2019-07-07 DIAGNOSIS — M5136 Other intervertebral disc degeneration, lumbar region: Secondary | ICD-10-CM

## 2019-07-07 DIAGNOSIS — M5441 Lumbago with sciatica, right side: Secondary | ICD-10-CM

## 2019-07-07 DIAGNOSIS — G8929 Other chronic pain: Secondary | ICD-10-CM

## 2019-07-07 DIAGNOSIS — M19012 Primary osteoarthritis, left shoulder: Secondary | ICD-10-CM

## 2019-07-07 DIAGNOSIS — M47816 Spondylosis without myelopathy or radiculopathy, lumbar region: Secondary | ICD-10-CM

## 2019-07-07 DIAGNOSIS — M51369 Other intervertebral disc degeneration, lumbar region without mention of lumbar back pain or lower extremity pain: Secondary | ICD-10-CM

## 2019-07-07 DIAGNOSIS — M5442 Lumbago with sciatica, left side: Secondary | ICD-10-CM

## 2019-07-07 MED ORDER — METAXALONE 800 MG PO TABS: 800.0000 mg | ORAL_TABLET | Freq: Every day | ORAL | 2 refills | Status: DC | PRN

## 2019-07-07 MED ORDER — MAGNESIUM 500 MG PO CAPS: 500.0000 mg | ORAL_CAPSULE | Freq: Every day | ORAL | 2 refills | Status: AC

## 2019-07-07 MED ORDER — HYDROCODONE-ACETAMINOPHEN 10-325 MG PO TABS: 1.0000 | ORAL_TABLET | Freq: Every day | ORAL | 0 refills | Status: DC | PRN

## 2019-07-07 NOTE — Progress Notes (Signed)
Patient: Charles Mooney  Service Category: E/M  Provider: Gillis Santa, MD  DOB: May 08, 1955  DOS: 07/07/2019  Location: Office  MRN: [id number]  Setting: Ambulatory outpatient  Referring Provider: Gale Journey, MD  Type: Established Patient  Specialty: Interventional Pain Management  PCP: Gale Journey, MD  Location: Home  Delivery: TeleHealth     Virtual Encounter - Pain Management PROVIDER NOTE: Information contained herein reflects review and annotations entered in association with encounter. Interpretation of such information and data should be left to medically-trained personnel. Information provided to patient can be located elsewhere in the medical record under "Patient Instructions". Document created using STT-dictation technology, any transcriptional errors that may result from process are unintentional.    Contact & Pharmacy Preferred: [phone] Home: [phone] (home) Mobile: [phone] (mobile) E-mail: [email or web address]  [place], [place] [address] Phone: [phone] Fax: [phone]   Pre-screening  Mr. Rainwater offered "in-person" vs "virtual" encounter. He indicated preferring virtual for this encounter.   Reason COVID-19*  Social distancing based on CDC and AMA recommendations.   I contacted Homero Fellers on 07/07/2019 via video conference.      I clearly identified myself as Gillis Santa, MD. I verified that I was speaking with the correct person using two identifiers (Name: LEIF LOFLIN, and date of birth: November 29, 1955).  Consent I sought verbal advanced consent from Homero Fellers for virtual visit interactions. I informed Mr. Fadden of possible security and privacy concerns, risks, and limitations associated with providing "not-in-person" medical evaluation and management services. I also informed Mr. Baird of the availability of "in-person" appointments. Finally, I informed him that there  would be a charge for the virtual visit and that he could be  personally, fully or partially, financially responsible for it. Mr. Culbreath expressed understanding and agreed to proceed.   Historic Elements   Mr. BORDEN THUNE is a 64 y.o. year old, male patient evaluated today after his last contact with our practice on 07/06/2019. Mr. Kessenich  has a past medical history of Hepatitis. He also  has a past surgical history that includes Brain surgery; Joint replacement (Bilateral); and Shoulder arthroscopy with rotator cuff repair and open biceps tenodesis (Left, 09/08/2018). Mr. Ono has a current medication list which includes the following prescription(s): b complex vitamins, cholecalciferol, hydrocodone-acetaminophen, [START ON 08/06/2019] hydrocodone-acetaminophen, NON FORMULARY, tizanidine, turmeric, magnesium, and metaxalone. He  reports that he quit smoking about 3 years ago. He has never used smokeless tobacco. He reports current alcohol use. He reports previous drug use. Mr. Petillo is allergic to sulfa antibiotics.   HPI  Today, he is being contacted for both, medication management and a post-procedure assessment.   Evaluation of last interventional procedure  07/06/2019 Procedure:   Type: Lumbar Facet, Medial Branch Block(s) #1  Primary Purpose: Diagnostic Region: Posterolateral Lumbosacral Spine Level: L3, L4, L5, & S1 Medial Branch Level(s). Injecting these levels blocks the L3-4, L4-5, and L5-S1 lumbar facet joints. Laterality: Bilateral  Sedation: Please see nurses note for DOS. When no sedatives are used, the analgesic levels obtained are directly associated to the effectiveness of the local anesthetics. However, when sedation is provided, the level of analgesia obtained during the initial 1 hour following the intervention, is believed to be the result of a combination of factors. These factors may include, but are not limited to: 1. The effectiveness of the local anesthetics  used. 2. The effects of the analgesic(s) and/or anxiolytic(s) used.  3. The degree of discomfort experienced by the patient at the time of the procedure. 4. The patients ability and reliability in recalling and recording the events. 5. The presence and influence of possible secondary gains and/or psychosocial factors. Reported result: Relief experienced during the 1st hour after the procedure: 50 % (Ultra-Short Term Relief)            Interpretative annotation: Clinically appropriate result. Analgesia during this period is likely to be Local Anesthetic and/or IV Sedative (Analgesic/Anxiolytic) related.          Effects of local anesthetic: The analgesic effects attained during this period are directly associated to the localized infiltration of local anesthetics and therefore cary significant diagnostic value as to the etiological location, or anatomical origin, of the pain. Expected duration of relief is directly dependent on the pharmacodynamics of the local anesthetic used. Long-acting (4-6 hours) anesthetics used.  Reported result: Relief during the next 4 to 6 hour after the procedure: 50 % (Short-Term Relief)            Interpretative annotation: Clinically appropriate result. Analgesia during this period is likely to be Local Anesthetic-related.          Long-term benefit: Defined as the period of time past the expected duration of local anesthetics (1 hour for short-acting and 4-6 hours for long-acting). With the possible exception of prolonged sympathetic blockade from the local anesthetics, benefits during this period are typically attributed to, or associated with, other factors such as analgesic sensory neuropraxia, antiinflammatory effects, or beneficial biochemical changes provided by agents other than the local anesthetics.  Reported result: Extended relief following procedure: 50 %(for 3 days only) (Long-Term Relief)            Interpretative annotation: Clinically appropriate result.  Short-term benefit. Inflammation plays a part in the etiology to the pain.          Pharmacotherapy Assessment  Analgesic: 06/01/2019  1   06/01/2019  Hydrocodone-Acetamin 10-325 MG  30.00  30 Bi Lat   [id number]   537 (3732)   0  10.00 MME  Comm Ins   [place]     Monitoring: [place] PMP: PDMP reviewed during this encounter.       Pharmacotherapy: No side-effects or adverse reactions reported. Compliance: No problems identified. Effectiveness: Clinically acceptable. Plan: Refer to "POC".  UDS:  Summary  Date Value Ref Range Status  04/28/2019 Note  Final    Comment:    ==================================================================== Compliance Drug Analysis, Ur ==================================================================== Test                             Result       Flag       Units Drug Present   Hydrocodone                    924                     ng/mg creat   Hydromorphone                  215                     ng/mg creat   Dihydrocodeine                 206  ng/mg creat   Norhydrocodone                 1363                    ng/mg creat    Sources of hydrocodone include scheduled prescription medications.    Hydromorphone, dihydrocodeine and norhydrocodone are expected    metabolites of hydrocodone. Hydromorphone and dihydrocodeine are    also available as scheduled prescription medications.   Methocarbamol                  PRESENT   Acetaminophen                  PRESENT ==================================================================== Test                      Result    Flag   Units      Ref Range   Creatinine              54               mg/dL      >=20 ==================================================================== Declared Medications:  Medication list was not provided. ==================================================================== For clinical consultation, please call [phone]. ====================================================================    Laboratory Chemistry Profile   Renal Lab Results  Component Value Date   BUN 18 09/04/2018   CREATININE 0.81 09/04/2018   GFRAA >60 09/04/2018   GFRNONAA >60 09/04/2018     Hepatic Lab Results  Component Value Date   AST 18 09/04/2018   ALT 20 09/04/2018   ALBUMIN 4.4 09/04/2018   ALKPHOS 39 09/04/2018     Electrolytes Lab Results  Component Value Date   NA 138 09/04/2018   K 3.9 09/04/2018   CL 105 09/04/2018   CALCIUM 9.2 09/04/2018     Bone No results found for: VD25OH, VD125OH2TOT, SK8768TL5, BW6203TD9, 25OHVITD1, 25OHVITD2, 25OHVITD3, TESTOFREE, TESTOSTERONE   Inflammation (CRP: Acute Phase) (ESR: Chronic Phase) No results found for: CRP, ESRSEDRATE, LATICACIDVEN     Note: Above Lab results reviewed.   Assessment  The primary encounter diagnosis was Chronic pain syndrome. Diagnoses of Lumbar facet arthropathy, Lumbar degenerative disc disease, Localized osteoarthritis of shoulder regions, bilateral, and Chronic bilateral low back pain with bilateral sciatica were also pertinent to this visit.  Plan of Care  Mr. LAWERANCE MATSUO is not on any long-term medications.   1.  Lumbar facet arthropathy: Status post diagnostic lumbar facet medial branch nerve blocks which were moderately effective, endorses 50% pain relief for 3 days with gradual return of pain thereafter.  Can consider repeating in future as well as considering radiofrequency ablation versus peripheral nerve stimulation of medial branch nerves. 2.  Chronic pain syndrome: Refill hydrocodone as below.  Slightly increased quantity to 45 tablets given increased low back pain at times. 3.  Lumbar paraspinal muscle spasms: Recommend hydration.  Will prescribe magnesium as below.  Patient has tried and failed Flexeril, baclofen, Robaxin, tizanidine.  Trial of Skelaxin as below.  Pharmacotherapy (Medications Ordered): Meds ordered  this encounter  Medications  . HYDROcodone-acetaminophen (NORCO) 10-325 MG tablet    Sig: Take 1-2 tablets by mouth daily as needed. For chronic pain syndrome    Dispense:  45 tablet    Refill:  0  . HYDROcodone-acetaminophen (NORCO) 10-325 MG tablet    Sig: Take 1-2 tablets by mouth daily as needed. For chronic pain syndrome    Dispense:  45 tablet  Refill:  0  . metaxalone (SKELAXIN) 800 MG tablet    Sig: Take 1 tablet (800 mg total) by mouth daily as needed for muscle spasms.    Dispense:  30 tablet    Refill:  2  . Magnesium 500 MG CAPS    Sig: Take 1 capsule (500 mg total) by mouth daily.    Dispense:  30 capsule    Refill:  2    Fill one day early if pharmacy is closed on scheduled refill date. May substitute for generic if available. May substitute with similar over-the-counter product.   Follow-up plan:   Return in about 8 weeks (around 09/01/2019) for Medication Management, in person.     Status post bilateral L3, L4, L5, S1 facet  medial branch nerve blocks #1 on 06/01/2019 50% pain relief for 3 days, consider repeating in future    Recent Visits Date Type Provider Dept  06/01/19 Procedure visit Gillis Santa, MD [facility]  05/13/19 Office Visit Gillis Santa, MD [facility]  04/27/19 Office Visit Gillis Santa, MD [facility]  Showing recent visits within past 90 days and meeting all other requirements   Today's Visits Date Type Provider Dept  07/07/19 Telemedicine Gillis Santa, MD [facility]  Showing today's visits and meeting all other requirements   Future Appointments No visits were found meeting these conditions.  Showing future appointments within next 90 days and meeting all other requirements   I discussed the assessment and treatment plan with the patient. The patient was provided an opportunity to ask questions and all were answered. The patient agreed with the plan and demonstrated an understanding of the  instructions.  Patient advised to call back or seek an in-person evaluation if the symptoms or condition worsens.  Duration of encounter:30 minutes.  Note by: Gillis Santa, MD Date: 07/07/2019; Time: 8:58 AM

## 2019-09-01 ENCOUNTER — Other Ambulatory Visit: Payer: Self-pay

## 2019-09-01 ENCOUNTER — Ambulatory Visit
Payer: Medicare Other | Attending: Student in an Organized Health Care Education/Training Program | Admitting: Student in an Organized Health Care Education/Training Program

## 2019-09-01 ENCOUNTER — Encounter: Payer: Self-pay | Admitting: Student in an Organized Health Care Education/Training Program

## 2019-09-01 VITALS — BP 98/87 | HR 77 | Temp 97.3°F | Resp 18 | Ht 72.0 in | Wt 220.0 lb

## 2019-09-01 DIAGNOSIS — Z79891 Long term (current) use of opiate analgesic: Secondary | ICD-10-CM | POA: Diagnosis present

## 2019-09-01 DIAGNOSIS — M19012 Primary osteoarthritis, left shoulder: Secondary | ICD-10-CM | POA: Diagnosis present

## 2019-09-01 DIAGNOSIS — M47816 Spondylosis without myelopathy or radiculopathy, lumbar region: Secondary | ICD-10-CM | POA: Insufficient documentation

## 2019-09-01 DIAGNOSIS — M5136 Other intervertebral disc degeneration, lumbar region: Secondary | ICD-10-CM | POA: Insufficient documentation

## 2019-09-01 DIAGNOSIS — M533 Sacrococcygeal disorders, not elsewhere classified: Secondary | ICD-10-CM

## 2019-09-01 DIAGNOSIS — M19011 Primary osteoarthritis, right shoulder: Secondary | ICD-10-CM | POA: Insufficient documentation

## 2019-09-01 DIAGNOSIS — G894 Chronic pain syndrome: Secondary | ICD-10-CM | POA: Insufficient documentation

## 2019-09-01 DIAGNOSIS — G8929 Other chronic pain: Secondary | ICD-10-CM | POA: Diagnosis present

## 2019-09-01 DIAGNOSIS — M5442 Lumbago with sciatica, left side: Secondary | ICD-10-CM

## 2019-09-01 DIAGNOSIS — M5441 Lumbago with sciatica, right side: Secondary | ICD-10-CM | POA: Insufficient documentation

## 2019-09-01 MED ORDER — HYDROCODONE-ACETAMINOPHEN 10-325 MG PO TABS: 1.0000 | ORAL_TABLET | Freq: Every day | ORAL | 0 refills | Status: DC | PRN

## 2019-09-01 MED ORDER — TIZANIDINE HCL 4 MG PO TABS: 6.0000 mg | ORAL_TABLET | Freq: Two times a day (BID) | ORAL | 1 refills | Status: DC | PRN

## 2019-09-01 NOTE — Progress Notes (Signed)
Nursing Pain Medication Assessment:  Safety precautions to be maintained throughout the outpatient stay will include: orient to surroundings, keep bed in low position, maintain call bell within reach at all times, provide assistance with transfer out of bed and ambulation.  Medication Inspection Compliance: Pill count conducted under aseptic conditions, in front of the patient. Neither the pills nor the bottle was removed from the patient's sight at any time. Once count was completed pills were immediately returned to the patient in their original bottle.  Medication: Hydrocodone/APAP Pill/Patch Count: 6 of 45 pills remain Pill/Patch Appearance: Markings consistent with prescribed medication Bottle Appearance: Standard pharmacy container. Clearly labeled. Filled Date: 06 / 28 / 2021 Last Medication intake:  Yesterday

## 2019-09-01 NOTE — Progress Notes (Signed)
PROVIDER NOTE: Information contained herein reflects review and annotations entered in association with encounter. Interpretation of such information and data should be left to medically-trained personnel. Information provided to patient can be located elsewhere in the medical record under "Patient Instructions". Document created using STT-dictation technology, any transcriptional errors that may result from process are unintentional.    Patient: Charles Mooney  Service Category: E/M  Provider: Gillis Santa, MD  DOB: 1955-05-22  DOS: 09/01/2019  Specialty: Interventional Pain Management  MRN: [id number]  Setting: Ambulatory outpatient  PCP: Gale Journey, MD  Type: Established Patient    Referring Provider: Gale Journey, MD  Location: Office  Delivery: Face-to-face     HPI  Reason for encounter: Charles Mooney, a 64 y.o. year old male, is here today for evaluation and management of his Lumbar facet arthropathy [M47.816]. Charles Mooney's primary complain today is Back Pain (low) Last encounter: Practice (07/06/2019). My last encounter with him was on 07/06/2019. Pertinent problems: Charles Mooney has Chronic bilateral low back pain with bilateral sciatica; Acute bursitis of right shoulder; Nonallopathic lesion of sacral region; Lumbar facet arthropathy; Lumbar degenerative disc disease; Localized osteoarthritis of shoulder regions, bilateral; Encounter for long-term opiate analgesic use; Chronic SI joint pain; and Chronic pain syndrome on their pertinent problem list. Pain Assessment: Severity of Chronic pain is reported as a 4 /10. Location: Back Lower/radiates down both legs to knee in the side. Onset: More than a month ago. Quality: Aching, Tingling. Timing: Constant. Modifying factor(s): medications. Vitals:  height is 6' (1.829 m) and weight is 220 lb (99.8 kg). His temperature is 97.3 F (36.3 C) (abnormal). His blood pressure is 98/87 and his pulse is 77. His respiration is 18 and oxygen  saturation is 100%.   Patient presents for medication management as below. He was prescribed Skelaxin and magnesium at his last visit for his paraspinal muscle spasms. He states that the magnesium is helping moderately. He does not find any significant benefit with the Skelaxin. He notes that tizanidine was better for his muscle spasms so we will transition back to tizanidine and increase dose to 6 mg twice daily as needed. Otherwise we will refill his hydrocodone as below. No change in dose or frequency. Given that the patient has tried and failed lumbar facet medial branch nerve block for his low back pain related to lumbar facet arthropathy, we discussed Sprint peripheral nerve stimulation of lumbar medial branch. Patient was provided resources regarding this therapy and questions were answered. Patient states that he will think about this further and let us know if he would like to proceed.  Pharmacotherapy Assessment   Analgesic:  08/17/2019  1   07/07/2019  Hydrocodone-Acetamin 10-325 MG  45.00  23 Bi Lat   [id number]   147 (3732)   0/0  19.57 MME  Comm Ins   [place]    Monitoring: [place] PMP: PDMP reviewed during this encounter.       Pharmacotherapy: No side-effects or adverse reactions reported. Compliance: No problems identified. Effectiveness: Clinically acceptable.  Dewayne Shorter, RN  09/01/2019  2:29 PM  Signed Nursing Pain Medication Assessment:  Safety precautions to be maintained throughout the outpatient stay will include: orient to surroundings, keep bed in low position, maintain call bell within reach at all times, provide assistance with transfer out of bed and ambulation.  Medication Inspection Compliance: Pill count conducted under aseptic conditions, in front of the patient. Neither the pills nor the bottle was removed from the patient's sight  at any time. Once count was completed pills were immediately returned to the patient in their original bottle.  Medication:  Hydrocodone/APAP Pill/Patch Count: 6 of 45 pills remain Pill/Patch Appearance: Markings consistent with prescribed medication Bottle Appearance: Standard pharmacy container. Clearly labeled. Filled Date: 06 / 28 / 2021 Last Medication intake:  Yesterday    UDS:  Summary  Date Value Ref Range Status  04/28/2019 Note  Final    Comment:    ==================================================================== Compliance Drug Analysis, Ur ==================================================================== Test                             Result       Flag       Units Drug Present   Hydrocodone                    924                     ng/mg creat   Hydromorphone                  215                     ng/mg creat   Dihydrocodeine                 206                     ng/mg creat   Norhydrocodone                 1363                    ng/mg creat    Sources of hydrocodone include scheduled prescription medications.    Hydromorphone, dihydrocodeine and norhydrocodone are expected    metabolites of hydrocodone. Hydromorphone and dihydrocodeine are    also available as scheduled prescription medications.   Methocarbamol                  PRESENT   Acetaminophen                  PRESENT ==================================================================== Test                      Result    Flag   Units      Ref Range   Creatinine              54               mg/dL      >=20 ==================================================================== Declared Medications:  Medication list was not provided. ==================================================================== For clinical consultation, please call [phone]. ====================================================================      ROS  Constitutional: Denies any fever or chills Gastrointestinal: No reported hemesis, hematochezia, vomiting, or acute GI distress Musculoskeletal: Denies any acute onset joint swelling,  redness, loss of ROM, or weakness Neurological: No reported episodes of acute onset apraxia, aphasia, dysarthria, agnosia, amnesia, paralysis, loss of coordination, or loss of consciousness  Medication Review  HYDROcodone-acetaminophen, Magnesium, NON FORMULARY, Turmeric, b complex vitamins, cholecalciferol, and tiZANidine  History Review  Allergy: Charles Mooney is allergic to sulfa antibiotics. Drug: Charles Mooney  reports previous drug use. Alcohol:  reports current alcohol use. Tobacco:  reports that he quit smoking about 3 years ago. He has never used smokeless tobacco. Social: Charles Mooney  reports that he quit smoking  about 3 years ago. He has never used smokeless tobacco. He reports current alcohol use. He reports previous drug use. Medical:  has a past medical history of Hepatitis. Surgical: Charles Mooney  has a past surgical history that includes Brain surgery; Joint replacement (Bilateral); and Shoulder arthroscopy with rotator cuff repair and open biceps tenodesis (Left, 09/08/2018). Family: family history is not on file.  Laboratory Chemistry Profile   Renal Lab Results  Component Value Date   BUN 18 09/04/2018   CREATININE 0.81 09/04/2018   GFRAA >60 09/04/2018   GFRNONAA >60 09/04/2018     Hepatic Lab Results  Component Value Date   AST 18 09/04/2018   ALT 20 09/04/2018   ALBUMIN 4.4 09/04/2018   ALKPHOS 39 09/04/2018     Electrolytes Lab Results  Component Value Date   NA 138 09/04/2018   K 3.9 09/04/2018   CL 105 09/04/2018   CALCIUM 9.2 09/04/2018     Bone No results found for: VD25OH, VD125OH2TOT, HC6237SE8, BT5176HY0, 25OHVITD1, 25OHVITD2, 25OHVITD3, TESTOFREE, TESTOSTERONE   Inflammation (CRP: Acute Phase) (ESR: Chronic Phase) No results found for: CRP, ESRSEDRATE, LATICACIDVEN     Note: Above Lab results reviewed.   Physical Exam  General appearance: Well nourished, well developed, and well hydrated. In no apparent acute distress Mental status:  Alert, oriented x 3 (person, place, & time)       Respiratory: No evidence of acute respiratory distress Eyes: PERLA Vitals: BP 98/87 Comment: 137/84  Pulse 77   Temp (!) 97.3 F (36.3 C)   Resp 18   Ht 6' (1.829 m)   Wt 220 lb (99.8 kg)   SpO2 100%   BMI 29.84 kg/m  BMI: Estimated body mass index is 29.84 kg/m as calculated from the following:   Height as of this encounter: 6' (1.829 m).   Weight as of this encounter: 220 lb (99.8 kg). Ideal: Ideal body weight: 77.6 kg (171 lb 1.2 oz) Adjusted ideal body weight: 86.5 kg (190 lb 10.3 oz)  Lumbar Spine Area Exam  Skin & Axial Inspection: No masses, redness, or swelling Alignment: Symmetrical Functional ROM: Pain restricted ROM affecting both sides Stability: No instability detected Muscle Tone/Strength: Functionally intact. No obvious neuro-muscular anomalies detected. Sensory (Neurological): Musculoskeletal pain pattern  Provocative Tests: Hyperextension/rotation test: (+) bilaterally for facet joint pain. Lumbar quadrant test (Kemp's test): (+) bilaterally for facet joint pain. Lateral bending test: (+) due to pain.  Gait & Posture Assessment  Ambulation: Unassisted Gait: Relatively normal for age and body habitus Posture: WNL  Lower Extremity Exam    Side: Right lower extremity  Side: Left lower extremity  Stability: No instability observed          Stability: No instability observed          Skin & Extremity Inspection: Skin color, temperature, and hair growth are WNL. No peripheral edema or cyanosis. No masses, redness, swelling, asymmetry, or associated skin lesions. No contractures.  Skin & Extremity Inspection: Skin color, temperature, and hair growth are WNL. No peripheral edema or cyanosis. No masses, redness, swelling, asymmetry, or associated skin lesions. No contractures.  Functional ROM: Unrestricted ROM                  Functional ROM: Unrestricted ROM                  Muscle Tone/Strength: Functionally  intact. No obvious neuro-muscular anomalies detected.  Muscle Tone/Strength: Functionally intact. No obvious neuro-muscular anomalies detected.  Sensory (  Neurological): Unimpaired        Sensory (Neurological): Unimpaired        DTR: Patellar: deferred today Achilles: deferred today Plantar: deferred today  DTR: Patellar: deferred today Achilles: deferred today Plantar: deferred today  Palpation: No palpable anomalies  Palpation: No palpable anomalies    Assessment   Status Diagnosis  Persistent Persistent Controlled 1. Lumbar facet arthropathy   2. Lumbar degenerative disc disease   3. Localized osteoarthritis of shoulder regions, bilateral   4. Chronic bilateral low back pain with bilateral sciatica   5. Encounter for long-term opiate analgesic use   6. Chronic SI joint pain   7. Chronic pain syndrome      Updated Problems: Problem  Lumbar Facet Arthropathy  Lumbar Degenerative Disc Disease  Localized Osteoarthritis of Shoulder Regions, Bilateral  Encounter for Long-Term Opiate Analgesic Use  Chronic Si Joint Pain  Chronic Pain Syndrome  Acute Bursitis of Right Shoulder   Injected June 14, 2017   Nonallopathic Lesion of Sacral Region  Chronic Bilateral Low Back Pain With Bilateral Sciatica    Plan of Care   Refill hydrocodone as below, no change in dose. Increase tizanidine to 6 mg twice daily as needed. Continue magnesium as prescribed. Consider sprint peripheral nerve stimulation of lumbar medial branch nerve. This was discussed with the patient in great detail. He was provided resources regarding this therapy. He will let us know if he would like to proceed.  Pharmacotherapy (Medications Ordered): Meds ordered this encounter  Medications  . HYDROcodone-acetaminophen (NORCO) 10-325 MG tablet    Sig: Take 1-2 tablets by mouth daily as needed. For chronic pain syndrome    Dispense:  45 tablet    Refill:  0  . tiZANidine (ZANAFLEX) 4 MG tablet    Sig: Take 1.5  tablets (6 mg total) by mouth 2 (two) times daily as needed for muscle spasms.    Dispense:  60 tablet    Refill:  1  . HYDROcodone-acetaminophen (NORCO) 10-325 MG tablet    Sig: Take 1-2 tablets by mouth daily as needed. For chronic pain syndrome    Dispense:  45 tablet    Refill:  0   Follow-up plan:   Return in about 10 weeks (around 11/10/2019) for Medication Management, in person.     Status post bilateral L3, L4, L5, S1 facet  medial branch nerve blocks #1 on 06/01/2019 50% pain relief for 3 days, consider repeating in future or consider sprint peripheral nerve stimulation of medial branch at L4    Recent Visits Date Type Provider Dept  07/07/19 Telemedicine Gillis Santa, MD [facility]  Showing recent visits within past 90 days and meeting all other requirements Today's Visits Date Type Provider Dept  09/01/19 Office Visit Gillis Santa, MD [facility]  Showing today's visits and meeting all other requirements Future Appointments Date Type Provider Dept  11/10/19 Appointment Gillis Santa, MD [facility]  Showing future appointments within next 90 days and meeting all other requirements  I discussed the assessment and treatment plan with the patient. The patient was provided an opportunity to ask questions and all were answered. The patient agreed with the plan and demonstrated an understanding of the instructions.  Patient advised to call back or seek an in-person evaluation if the symptoms or condition worsens.  Duration of encounter: 30 minutes.  Note by: Gillis Santa, MD Date: 09/01/2019; Time: 3:13 PM

## 2019-11-02 ENCOUNTER — Other Ambulatory Visit: Payer: Self-pay

## 2019-11-02 ENCOUNTER — Ambulatory Visit (INDEPENDENT_AMBULATORY_CARE_PROVIDER_SITE_OTHER): Payer: Medicare Other

## 2019-11-02 ENCOUNTER — Encounter: Payer: Self-pay | Admitting: Podiatry

## 2019-11-02 ENCOUNTER — Ambulatory Visit (INDEPENDENT_AMBULATORY_CARE_PROVIDER_SITE_OTHER): Payer: Medicare Other | Admitting: Podiatry

## 2019-11-02 ENCOUNTER — Other Ambulatory Visit: Payer: Self-pay | Admitting: Podiatry

## 2019-11-02 DIAGNOSIS — M205X2 Other deformities of toe(s) (acquired), left foot: Secondary | ICD-10-CM

## 2019-11-02 DIAGNOSIS — M7752 Other enthesopathy of left foot: Secondary | ICD-10-CM

## 2019-11-02 DIAGNOSIS — M722 Plantar fascial fibromatosis: Secondary | ICD-10-CM

## 2019-11-02 DIAGNOSIS — B1920 Unspecified viral hepatitis C without hepatic coma: Secondary | ICD-10-CM | POA: Insufficient documentation

## 2019-11-02 MED ORDER — MELOXICAM 15 MG PO TABS
15.0000 mg | ORAL_TABLET | Freq: Every day | ORAL | 3 refills | Status: DC
Start: 2019-11-02 — End: 2020-12-30

## 2019-11-02 NOTE — Progress Notes (Signed)
Subjective:  Patient ID: Charles Mooney, male    DOB: 1956-02-15,  MRN: [id number] HPI Chief Complaint  Patient presents with  . Foot Pain    Lateral side left - aching x 6-7 months, worse at the end of the day, shoes sometimes uncomfortable, takes pain meds for other probs, so helps some, "electrical shock, pinch-like sensations" ocassional in midfoot and forefoot  . Foot Pain    1st MPJ left - joint hurts sometimes  . New Patient (Initial Visit)    64 y.o. male presents with the above complaint.   ROS: He denies fever chills nausea vomiting muscle aches pains calf pain back pain chest pain shortness of breath.  Past Medical History:  Diagnosis Date  . Hepatitis    c 2000   Past Surgical History:  Procedure Laterality Date  . BRAIN SURGERY     [facility] COILING 2017  . JOINT REPLACEMENT Bilateral    TKR  . SHOULDER ARTHROSCOPY WITH ROTATOR CUFF REPAIR AND OPEN BICEPS TENODESIS Left 09/08/2018   Procedure: LEFT SHOULDER ARTHROSCOPY,SUBSACAPULARIS REPAIR, SUBACROMIAL DECOMP, DISTAL CLAVICLE EXCISION,BICEP TENODESIS, MINI OPEN REGENTEN PATCH APPLICATION;  Surgeon: Leim Fabry, MD;  Location: [facility] ORS;  Service: Orthopedics;  Laterality: Left;    Current Outpatient Medications:  .  b complex vitamins tablet, Take 1 tablet by mouth daily., Disp: , Rfl:  .  cholecalciferol (VITAMIN D3) 25 MCG (1000 UT) tablet, Take 1,000 Units by mouth daily., Disp: , Rfl:  .  HYDROcodone-acetaminophen (NORCO) 10-325 MG tablet, Take 1-2 tablets by mouth daily as needed. For chronic pain syndrome, Disp: 45 tablet, Rfl: 0 .  meloxicam (MOBIC) 15 MG tablet, Take 1 tablet (15 mg total) by mouth daily., Disp: 30 tablet, Rfl: 3 .  NON FORMULARY, Take 25 mg by mouth. CBD gummies- 2 midday and 1 at HS, Disp: , Rfl:  .  tiZANidine (ZANAFLEX) 4 MG tablet, Take 1.5 tablets (6 mg total) by mouth 2 (two) times daily as needed for muscle spasms., Disp: 60 tablet, Rfl: 1 .  Turmeric 500 MG CAPS, Take 500 mg by mouth  daily., Disp: , Rfl:   Allergies  Allergen Reactions  . Sulfa Antibiotics Hives   Review of Systems Objective:  There were no vitals filed for this visit.  General: Well developed, nourished, in no acute distress, alert and oriented x3   Dermatological: Skin is warm, dry and supple bilateral. Nails x 10 are well maintained; remaining integument appears unremarkable at this time. There are no open sores, no preulcerative lesions, no rash or signs of infection present.  Vascular: Dorsalis Pedis artery and Posterior Tibial artery pedal pulses are 2/4 bilateral with immedate capillary fill time. Pedal hair growth present. No varicosities and no lower extremity edema present bilateral.   Neruologic: Grossly intact via light touch bilateral. Vibratory intact via tuning fork bilateral. Protective threshold with Semmes Wienstein monofilament intact to all pedal sites bilateral. Patellar and Achilles deep tendon reflexes 2+ bilateral. No Babinski or clonus noted bilateral.   Musculoskeletal: No gross boney pedal deformities bilateral. No pain, crepitus, or limitation noted with foot and ankle range of motion bilateral. Muscular strength 5/5 in all groups tested bilateral.  He has limited plantarflexion of the first metatarsophalangeal joints with osteoarthritic changes of the sesamoids.  Palpable osseous mass medial aspect proximal phalanx fourth digit left foot.  Palpable fluctuant mass beneath the fifth metatarsal base of the left foot consistent with bursitis mildly tender on palpation.  Otherwise no significant abnormalities the lateral aspect  of the foot.  Gait: Unassisted, Nonantalgic.    Radiographs:  Radiographs taken today demonstrate an osseously mature individual relatively normal osseous architecture with exception of some early osteoarthritic changes of the first metatarsophalangeal joint with some dorsal spurring and subchondral sclerosis and eburnation.  He is also had a fracture to  the proximal phalanx of the fourth digit of the left foot which demonstrates some hypertrophic bone growth during repair.  This does not appear to be any type of malignancy.  He also has some soft tissue swelling around the lateral aspect of the fifth metatarsal area and some early joint space narrowing of the second tarsometatarsal joint.  Assessment & Plan:   Assessment: Bursitis insertional tendinitis left fifth met base.  Capsulitis hallux limitus first metatarsophalangeal joint left.  Plan: Discussed etiology pathology conservative versus surgical therapies at this point he would does not mind trying meloxicam 15 mg 1 p.o. daily.  States that he is very sensitive to certain drugs so this may not work and he may have to try the topical Voltaren gel.  I did offer him injections to the bursa and the joint today he declined.  Discussed the possibility of custom built orthotics.  We did discuss appropriate shoe gear stretching exercise ice therapy sugar modifications.     Travin Marik [employer], [place]

## 2019-11-10 ENCOUNTER — Other Ambulatory Visit: Payer: Self-pay

## 2019-11-10 ENCOUNTER — Ambulatory Visit
Payer: Medicare Other | Attending: Student in an Organized Health Care Education/Training Program | Admitting: Student in an Organized Health Care Education/Training Program

## 2019-11-10 ENCOUNTER — Encounter: Payer: Self-pay | Admitting: Student in an Organized Health Care Education/Training Program

## 2019-11-10 VITALS — BP 140/94 | HR 65 | Temp 97.2°F | Resp 16 | Ht 72.0 in | Wt 220.0 lb

## 2019-11-10 DIAGNOSIS — M5441 Lumbago with sciatica, right side: Secondary | ICD-10-CM | POA: Diagnosis present

## 2019-11-10 DIAGNOSIS — M19012 Primary osteoarthritis, left shoulder: Secondary | ICD-10-CM | POA: Diagnosis present

## 2019-11-10 DIAGNOSIS — M5442 Lumbago with sciatica, left side: Secondary | ICD-10-CM | POA: Diagnosis present

## 2019-11-10 DIAGNOSIS — Z79891 Long term (current) use of opiate analgesic: Secondary | ICD-10-CM | POA: Diagnosis present

## 2019-11-10 DIAGNOSIS — G894 Chronic pain syndrome: Secondary | ICD-10-CM

## 2019-11-10 DIAGNOSIS — M5136 Other intervertebral disc degeneration, lumbar region: Secondary | ICD-10-CM | POA: Diagnosis present

## 2019-11-10 DIAGNOSIS — M47816 Spondylosis without myelopathy or radiculopathy, lumbar region: Secondary | ICD-10-CM

## 2019-11-10 DIAGNOSIS — M19011 Primary osteoarthritis, right shoulder: Secondary | ICD-10-CM | POA: Diagnosis not present

## 2019-11-10 DIAGNOSIS — G8929 Other chronic pain: Secondary | ICD-10-CM | POA: Diagnosis present

## 2019-11-10 DIAGNOSIS — M533 Sacrococcygeal disorders, not elsewhere classified: Secondary | ICD-10-CM | POA: Diagnosis present

## 2019-11-10 MED ORDER — HYDROCODONE-ACETAMINOPHEN 10-325 MG PO TABS: 1.0000 | ORAL_TABLET | Freq: Every day | ORAL | 0 refills | Status: DC | PRN

## 2019-11-10 NOTE — Progress Notes (Signed)
Nursing Pain Medication Assessment:  Safety precautions to be maintained throughout the outpatient stay will include: orient to surroundings, keep bed in low position, maintain call bell within reach at all times, provide assistance with transfer out of bed and ambulation.  Medication Inspection Compliance: Pill count conducted under aseptic conditions, in front of the patient. Neither the pills nor the bottle was removed from the patient's sight at any time. Once count was completed pills were immediately returned to the patient in their original bottle.  Medication: Hydrocodone/APAP Pill/Patch Count: 11 of 45 pills remain Pill/Patch Appearance: Markings consistent with prescribed medication Bottle Appearance: Standard pharmacy container. Clearly labeled. Filled Date: 08 / 26 / 2021 Last Medication intake:  Yesterday

## 2019-11-10 NOTE — Progress Notes (Signed)
PROVIDER NOTE: Information contained herein reflects review and annotations entered in association with encounter. Interpretation of such information and data should be left to medically-trained personnel. Information provided to patient can be located elsewhere in the medical record under "Patient Instructions". Document created using STT-dictation technology, any transcriptional errors that may result from process are unintentional.    Patient: Charles Mooney  Service Category: E/M  Provider: Gillis Santa, MD  DOB: [birth year]  DOS: 11/10/2019  Specialty: Interventional Pain Management  MRN: [id number]  Setting: Ambulatory outpatient  PCP: Gale Journey, MD  Type: Established Patient    Referring Provider: Gale Journey, MD  Location: Office  Delivery: Face-to-face     HPI  Reason for encounter: Charles Mooney, a 64 y.o. year old male, is here today for evaluation and management of his Lumbar facet arthropathy [M47.816]. Charles Mooney's primary complain today is Back Pain (bilateral lumbar), Foot Pain (left ), Wrist Pain (bilateral), and Abdominal Pain (intercostal left side s/p fx approx 2 months ago ) Last encounter: Practice (09/01/2019). My last encounter with him was on 09/01/2019. Pertinent problems: Charles Mooney has Chronic bilateral low back pain with bilateral sciatica; Acute bursitis of right shoulder; Nonallopathic lesion of sacral region; Lumbar facet arthropathy; Lumbar degenerative disc disease; Localized osteoarthritis of shoulder regions, bilateral; Encounter for long-term opiate analgesic use; Chronic SI joint pain; and Chronic pain syndrome on their pertinent problem list. Pain Assessment: Severity of Chronic pain is reported as a 4 /10. Location: Back (see visit info for additional pain sites.) Lower, Left, Right/back pain into hip and leg on left side. Onset: More than a month ago. Quality: Discomfort, Aching, Burning, Sharp, Dull, Constant. Timing: Constant. Modifying  factor(s): pain medications help, hot bath, rest. Vitals:  height is 6' (1.829 m) and weight is 220 lb (99.8 kg). His temporal temperature is 97.2 F (36.2 C) (abnormal). His blood pressure is 140/94 (abnormal) and his pulse is 65. His respiration is 16 and oxygen saturation is 98%.   Saw podiatry for right ankle arthritis: placed on PO Mobic and prn Voltaren gel. Otherwise, patient's pain is at baseline.  Patient continues multimodal pain regimen as prescribed.  States that it provides pain relief and improvement in functional status.   Pharmacotherapy Assessment   10/15/2019  1   09/01/2019  Hydrocodone-Acetamin 10-325 MG  45.00  23 Bi Lat   [id number]   269 (3732)   0/0  19.57 MME  Comm Ins   [place]      Monitoring:  PMP: PDMP reviewed during this encounter.       Pharmacotherapy: No side-effects or adverse reactions reported. Compliance: No problems identified. Effectiveness: Clinically acceptable.  Janett Billow, RN  11/10/2019 11:07 AM  Sign when Signing Visit Nursing Pain Medication Assessment:  Safety precautions to be maintained throughout the outpatient stay will include: orient to surroundings, keep bed in low position, maintain call bell within reach at all times, provide assistance with transfer out of bed and ambulation.  Medication Inspection Compliance: Pill count conducted under aseptic conditions, in front of the patient. Neither the pills nor the bottle was removed from the patient's sight at any time. Once count was completed pills were immediately returned to the patient in their original bottle.  Medication: Hydrocodone/APAP Pill/Patch Count: 11 of 45 pills remain Pill/Patch Appearance: Markings consistent with prescribed medication Bottle Appearance: Standard pharmacy container. Clearly labeled. Filled Date: 08 / 26 / 2021 Last Medication intake:  Yesterday    UDS:  Summary  Date Value Ref Range Status  04/28/2019 Note  Final    Comment:     ==================================================================== Compliance Drug Analysis, Ur ==================================================================== Test                             Result       Flag       Units Drug Present   Hydrocodone                    924                     ng/mg creat   Hydromorphone                  215                     ng/mg creat   Dihydrocodeine                 206                     ng/mg creat   Norhydrocodone                 1363                    ng/mg creat    Sources of hydrocodone include scheduled prescription medications.    Hydromorphone, dihydrocodeine and norhydrocodone are expected    metabolites of hydrocodone. Hydromorphone and dihydrocodeine are    also available as scheduled prescription medications.   Methocarbamol                  PRESENT   Acetaminophen                  PRESENT ==================================================================== Test                      Result    Flag   Units      Ref Range   Creatinine              54               mg/dL      >=20 ==================================================================== Declared Medications:  Medication list was not provided. ==================================================================== For clinical consultation, please call [phone]. ====================================================================      ROS  Constitutional: Denies any fever or chills Gastrointestinal: No reported hemesis, hematochezia, vomiting, or acute GI distress Musculoskeletal: Denies any acute onset joint swelling, redness, loss of ROM, or weakness Neurological: No reported episodes of acute onset apraxia, aphasia, dysarthria, agnosia, amnesia, paralysis, loss of coordination, or loss of consciousness  Medication Review  HYDROcodone-acetaminophen, NON FORMULARY, Turmeric, b complex vitamins, cholecalciferol, meloxicam, and tiZANidine  History Review   Allergy: Charles Mooney is allergic to sulfa antibiotics. Drug: Charles Mooney  reports previous drug use. Alcohol:  reports current alcohol use. Tobacco:  reports that he quit smoking about 3 years ago. He has never used smokeless tobacco. Social: Mr. Hammar  reports that he quit smoking about 3 years ago. He has never used smokeless tobacco. He reports current alcohol use. He reports previous drug use. Medical:  has a past medical history of Hepatitis. Surgical: Mr. Barnier  has a past surgical history that includes Brain surgery; Joint replacement (Bilateral); and Shoulder arthroscopy with rotator cuff repair and open biceps tenodesis (Left, 09/08/2018). Family: family history  is not on file.  Laboratory Chemistry Profile   Renal Lab Results  Component Value Date   BUN 18 09/04/2018   CREATININE 0.81 09/04/2018   GFRAA >60 09/04/2018   GFRNONAA >60 09/04/2018     Hepatic Lab Results  Component Value Date   AST 18 09/04/2018   ALT 20 09/04/2018   ALBUMIN 4.4 09/04/2018   ALKPHOS 39 09/04/2018     Electrolytes Lab Results  Component Value Date   NA 138 09/04/2018   K 3.9 09/04/2018   CL 105 09/04/2018   CALCIUM 9.2 09/04/2018     Bone No results found for: VD25OH, VD125OH2TOT, ZO1096EA5, WU9811BJ4, 25OHVITD1, 25OHVITD2, 25OHVITD3, TESTOFREE, TESTOSTERONE   Inflammation (CRP: Acute Phase) (ESR: Chronic Phase) No results found for: CRP, ESRSEDRATE, LATICACIDVEN     Note: Above Lab results reviewed.  Recent Imaging Review  DG Foot Complete Left Please see detailed radiograph report in office note. Note: Reviewed        Physical Exam  General appearance: Well nourished, well developed, and well hydrated. In no apparent acute distress Mental status: Alert, oriented x 3 (person, place, & time)       Respiratory: No evidence of acute respiratory distress Eyes: PERLA Vitals: BP (!) 140/94 (BP Location: Right Arm, Patient Position: Sitting, Cuff Size: Normal)   Pulse 65    Temp (!) 97.2 F (36.2 C) (Temporal)   Resp 16   Ht 6' (1.829 m)   Wt 220 lb (99.8 kg)   SpO2 98%   BMI 29.84 kg/m  BMI: Estimated body mass index is 29.84 kg/m as calculated from the following:   Height as of this encounter: 6' (1.829 m).   Weight as of this encounter: 220 lb (99.8 kg). Ideal: Ideal body weight: 77.6 kg (171 lb 1.2 oz) Adjusted ideal body weight: 86.5 kg (190 lb 10.3 oz)   Lumbar Spine Area Exam  Skin & Axial Inspection: No masses, redness, or swelling Alignment: Symmetrical Functional ROM: Pain restricted ROM affecting both sides Stability: No instability detected Muscle Tone/Strength: Functionally intact. No obvious neuro-muscular anomalies detected. Sensory (Neurological): Musculoskeletal pain pattern  Provocative Tests: Hyperextension/rotation test: (+) bilaterally for facet joint pain. Lumbar quadrant test (Kemp's test): (+) bilaterally for facet joint pain. Lateral bending test: (+) due to pain.  Gait & Posture Assessment  Ambulation: Unassisted Gait: Relatively normal for age and body habitus Posture: WNL  Lower Extremity Exam    Side: Right lower extremity  Side: Left lower extremity  Stability: No instability observed          Stability: No instability observed          Skin & Extremity Inspection: Skin color, temperature, and hair growth are WNL. No peripheral edema or cyanosis. No masses, redness, swelling, asymmetry, or associated skin lesions. No contractures.  Skin & Extremity Inspection: Skin color, temperature, and hair growth are WNL. No peripheral edema or cyanosis. No masses, redness, swelling, asymmetry, or associated skin lesions. No contractures.  Functional ROM: Unrestricted ROM                  Functional ROM:  Pain restricted range of motion for left ankle                Muscle Tone/Strength: Functionally intact. No obvious neuro-muscular anomalies detected.  Muscle Tone/Strength: Functionally intact. No obvious  neuro-muscular anomalies detected.  Sensory (Neurological): Unimpaired        Sensory (Neurological): Unimpaired        DTR:  Patellar: deferred today Achilles: deferred today Plantar: deferred today  DTR: Patellar: deferred today Achilles: deferred today Plantar: deferred today  Palpation: No palpable anomalies  Palpation: No palpable anomalies     Assessment   Status Diagnosis  Controlled Controlled Controlled 1. Lumbar facet arthropathy   2. Lumbar degenerative disc disease   3. Localized osteoarthritis of shoulder regions, bilateral   4. Chronic bilateral low back pain with bilateral sciatica   5. Encounter for long-term opiate analgesic use   6. Chronic SI joint pain   7. Chronic pain syndrome      Plan of Care   Mr. IVY MERIWETHER has a current medication list which includes the following long-term medication(s): [START ON 11/14/2019] hydrocodone-acetaminophen, [START ON 12/14/2019] hydrocodone-acetaminophen, and [START ON 01/13/2020] hydrocodone-acetaminophen.  Pharmacotherapy (Medications Ordered): Meds ordered this encounter  Medications  . HYDROcodone-acetaminophen (NORCO) 10-325 MG tablet    Sig: Take 1-2 tablets by mouth daily as needed. For chronic pain syndrome    Dispense:  45 tablet    Refill:  0  . HYDROcodone-acetaminophen (NORCO) 10-325 MG tablet    Sig: Take 1-2 tablets by mouth daily as needed. For chronic pain syndrome    Dispense:  45 tablet    Refill:  0  . HYDROcodone-acetaminophen (NORCO) 10-325 MG tablet    Sig: Take 1-2 tablets by mouth daily as needed. For chronic pain syndrome    Dispense:  45 tablet    Refill:  0   Continue meloxicam, tizanidine as needed.  Follow-up plan:   Return in about 3 months (around 02/09/2020) for Medication Management.     Status post bilateral L3, L4, L5, S1 facet  medial branch nerve blocks #1 on 06/01/2019 50% pain relief for 3 days, consider repeating in future or consider sprint peripheral nerve  stimulation of medial branch at L4     Recent Visits Date Type Provider Dept  09/01/19 Office Visit Gillis Santa, MD [facility]  Showing recent visits within past 90 days and meeting all other requirements Today's Visits Date Type Provider Dept  11/10/19 Office Visit Gillis Santa, MD [facility]  Showing today's visits and meeting all other requirements Future Appointments Date Type Provider Dept  02/04/20 Appointment Gillis Santa, MD [facility]  Showing future appointments within next 90 days and meeting all other requirements  I discussed the assessment and treatment plan with the patient. The patient was provided an opportunity to ask questions and all were answered. The patient agreed with the plan and demonstrated an understanding of the instructions.  Patient advised to call back or seek an in-person evaluation if the symptoms or condition worsens.  Duration of encounter: 44mnutes.  Note by: BGillis Santa MD Date: 11/10/2019; Time: 11:58 AM

## 2020-02-04 ENCOUNTER — Encounter: Payer: Medicare Other | Admitting: Student in an Organized Health Care Education/Training Program

## 2020-03-15 ENCOUNTER — Other Ambulatory Visit: Payer: Self-pay

## 2020-03-15 ENCOUNTER — Encounter: Payer: Self-pay | Admitting: Student in an Organized Health Care Education/Training Program

## 2020-03-15 ENCOUNTER — Ambulatory Visit
Payer: Medicare Other | Attending: Student in an Organized Health Care Education/Training Program | Admitting: Student in an Organized Health Care Education/Training Program

## 2020-03-15 VITALS — BP 145/95 | HR 74 | Temp 97.1°F | Resp 18 | Ht 72.0 in | Wt 220.0 lb

## 2020-03-15 DIAGNOSIS — M533 Sacrococcygeal disorders, not elsewhere classified: Secondary | ICD-10-CM

## 2020-03-15 DIAGNOSIS — G894 Chronic pain syndrome: Secondary | ICD-10-CM | POA: Insufficient documentation

## 2020-03-15 DIAGNOSIS — G8929 Other chronic pain: Secondary | ICD-10-CM | POA: Diagnosis present

## 2020-03-15 DIAGNOSIS — M47816 Spondylosis without myelopathy or radiculopathy, lumbar region: Secondary | ICD-10-CM | POA: Diagnosis not present

## 2020-03-15 DIAGNOSIS — M5442 Lumbago with sciatica, left side: Secondary | ICD-10-CM | POA: Insufficient documentation

## 2020-03-15 DIAGNOSIS — M19012 Primary osteoarthritis, left shoulder: Secondary | ICD-10-CM | POA: Diagnosis present

## 2020-03-15 DIAGNOSIS — M19011 Primary osteoarthritis, right shoulder: Secondary | ICD-10-CM | POA: Insufficient documentation

## 2020-03-15 DIAGNOSIS — Z79891 Long term (current) use of opiate analgesic: Secondary | ICD-10-CM | POA: Diagnosis present

## 2020-03-15 DIAGNOSIS — M5136 Other intervertebral disc degeneration, lumbar region: Secondary | ICD-10-CM | POA: Diagnosis not present

## 2020-03-15 DIAGNOSIS — M5441 Lumbago with sciatica, right side: Secondary | ICD-10-CM | POA: Diagnosis present

## 2020-03-15 MED ORDER — HYDROCODONE-ACETAMINOPHEN 10-325 MG PO TABS: 1.0000 | ORAL_TABLET | Freq: Every day | ORAL | 0 refills | Status: DC | PRN

## 2020-03-15 MED ORDER — TIZANIDINE HCL 4 MG PO TABS: 6.0000 mg | ORAL_TABLET | Freq: Two times a day (BID) | ORAL | 1 refills | Status: DC | PRN

## 2020-03-15 NOTE — Progress Notes (Signed)
Nursing Pain Medication Assessment:  Safety precautions to be maintained throughout the outpatient stay will include: orient to surroundings, keep bed in low position, maintain call bell within reach at all times, provide assistance with transfer out of bed and ambulation.  Medication Inspection Compliance: Pill count conducted under aseptic conditions, in front of the patient. Neither the pills nor the bottle was removed from the patient's sight at any time. Once count was completed pills were immediately returned to the patient in their original bottle.  Medication: Hydrocodone/APAP Pill/Patch Count: 0 of 45 pills remain Pill/Patch Appearance: Markings consistent with prescribed medication Bottle Appearance: Standard pharmacy container. Clearly labeled. Filled Date: 60 / 30 / 21 Last Medication intake:  Yesterday

## 2020-03-15 NOTE — Progress Notes (Signed)
PROVIDER NOTE: Information contained herein reflects review and annotations entered in association with encounter. Interpretation of such information and data should be left to medically-trained personnel. Information provided to patient can be located elsewhere in the medical record under "Patient Instructions". Document created using STT-dictation technology, any transcriptional errors that may result from process are unintentional.    Patient: Charles Mooney  Service Category: E/M  Provider: Gillis Santa, MD  DOB: February 13, 1956  DOS: 03/15/2020  Specialty: Interventional Pain Management  MRN: [id number]  Setting: Ambulatory outpatient  PCP: Gale Journey, MD  Type: Established Patient    Referring Provider: Gale Journey, MD  Location: Office  Delivery: Face-to-face     HPI  [name], a 65 y.o. year old male, is here today because of his Lumbar facet arthropathy [M47.816]. Charles Mooney's primary complain today is Back Pain (lower) Last encounter: My last encounter with him was on 11/10/2019. Pertinent problems: Charles Mooney has Chronic bilateral low back pain with bilateral sciatica; Acute bursitis of right shoulder; Nonallopathic lesion of sacral region; Lumbar facet arthropathy; Lumbar degenerative disc disease; Localized osteoarthritis of shoulder regions, bilateral; Encounter for long-term opiate analgesic use; Chronic SI joint pain; and Chronic pain syndrome on their pertinent problem list. Pain Assessment: Severity of Chronic pain is reported as a 6 /10. Location: Back Lower/down both legs to feet, not including toes. Onset: More than a month ago. Quality: Cramping,Spasm. Timing: Constant. Modifying factor(s): meds. Vitals:  height is 6' (1.829 m) and weight is 220 lb (99.8 kg). His temporal temperature is 97.1 F (36.2 C) (abnormal). His blood pressure is 145/95 (abnormal) and his pulse is 74. His respiration is 18 and oxygen saturation is 99%.   Reason for encounter: medication  management.    No change in medical history since last visit.  Patient's pain is at baseline.  Patient continues multimodal pain regimen as prescribed.  States that it provides pain relief and improvement in functional status. Patient finds hydrocodone somewhat activating so avoids taking it after 4 PM.  He has tried amitriptyline in the past with side effects of hallucination.  He has tried and failed gabapentin and Lyrica which resulted in side effects of confusion.  Pharmacotherapy Assessment   01/19/2020  11/10/2019   1  Hydrocodone-Acetamin 10-325 Mg  45.00  23  Bi Lat  [id number]  388 (3732)  0/0  19.57 MME  Comm Ins     Analgesic: Hydrocodone 10 mg BID prn   Monitoring: [place] PMP: PDMP reviewed during this encounter.       Pharmacotherapy: No side-effects or adverse reactions reported. Compliance: No problems identified. Effectiveness: Clinically acceptable.  Rise Patience, RN  03/15/2020  8:38 AM  Sign when Signing Visit Nursing Pain Medication Assessment:  Safety precautions to be maintained throughout the outpatient stay will include: orient to surroundings, keep bed in low position, maintain call bell within reach at all times, provide assistance with transfer out of bed and ambulation.  Medication Inspection Compliance: Pill count conducted under aseptic conditions, in front of the patient. Neither the pills nor the bottle was removed from the patient's sight at any time. Once count was completed pills were immediately returned to the patient in their original bottle.  Medication: Hydrocodone/APAP Pill/Patch Count: 0 of 45 pills remain Pill/Patch Appearance: Markings consistent with prescribed medication Bottle Appearance: Standard pharmacy container. Clearly labeled. Filled Date: 28 / 30 / 21 Last Medication intake:  Yesterday    UDS:  Summary  Date Value Ref Range Status  04/28/2019 Note  Final    Comment:     ==================================================================== Compliance Drug Analysis, Ur ==================================================================== Test                             Result       Flag       Units Drug Present   Hydrocodone                    924                     ng/mg creat   Hydromorphone                  215                     ng/mg creat   Dihydrocodeine                 206                     ng/mg creat   Norhydrocodone                 1363                    ng/mg creat    Sources of hydrocodone include scheduled prescription medications.    Hydromorphone, dihydrocodeine and norhydrocodone are expected    metabolites of hydrocodone. Hydromorphone and dihydrocodeine are    also available as scheduled prescription medications.   Methocarbamol                  PRESENT   Acetaminophen                  PRESENT ==================================================================== Test                      Result    Flag   Units      Ref Range   Creatinine              54               mg/dL      >=20 ==================================================================== Declared Medications:  Medication list was not provided. ==================================================================== For clinical consultation, please call [phone]. ====================================================================      ROS  Constitutional: Denies any fever or chills Gastrointestinal: No reported hemesis, hematochezia, vomiting, or acute GI distress Musculoskeletal: Denies any acute onset joint swelling, redness, loss of ROM, or weakness Neurological: No reported episodes of acute onset apraxia, aphasia, dysarthria, agnosia, amnesia, paralysis, loss of coordination, or loss of consciousness  Medication Review  HYDROcodone-acetaminophen, NON FORMULARY, Turmeric, b complex vitamins, cholecalciferol, meloxicam, and tiZANidine  History Review   Allergy: Charles Mooney is allergic to sulfa antibiotics. Drug: Charles Mooney  reports previous drug use. Alcohol:  reports current alcohol use. Tobacco:  reports that he quit smoking about 4 years ago. He has never used smokeless tobacco. Social: Charles Mooney  reports that he quit smoking about 4 years ago. He has never used smokeless tobacco. He reports current alcohol use. He reports previous drug use. Medical:  has a past medical history of Hepatitis. Surgical: Charles Mooney  has a past surgical history that includes Brain surgery; Joint replacement (Bilateral); and Shoulder arthroscopy with rotator cuff repair and open biceps tenodesis (Left, 09/08/2018). Family: family history is not on file.  Laboratory  Chemistry Profile   Renal Lab Results  Component Value Date   BUN 18 09/04/2018   CREATININE 0.81 09/04/2018   GFRAA >60 09/04/2018   GFRNONAA >60 09/04/2018     Hepatic Lab Results  Component Value Date   AST 18 09/04/2018   ALT 20 09/04/2018   ALBUMIN 4.4 09/04/2018   ALKPHOS 39 09/04/2018     Electrolytes Lab Results  Component Value Date   NA 138 09/04/2018   K 3.9 09/04/2018   CL 105 09/04/2018   CALCIUM 9.2 09/04/2018     Bone No results found for: VD25OH, VD125OH2TOT, [id number], QH4765YY5, 25OHVITD1, 25OHVITD2, 25OHVITD3, TESTOFREE, TESTOSTERONE   Inflammation (CRP: Acute Phase) (ESR: Chronic Phase) No results found for: CRP, ESRSEDRATE, LATICACIDVEN     Note: Above Lab results reviewed.   Physical Exam  General appearance: Well nourished, well developed, and well hydrated. In no apparent acute distress Mental status: Alert, oriented x 3 (person, place, & time)       Respiratory: No evidence of acute respiratory distress Eyes: PERLA Vitals: BP (!) 145/95   Pulse 74   Temp (!) 97.1 F (36.2 C) (Temporal)   Resp 18   Ht 6' (1.829 m)   Wt 220 lb (99.8 kg)   SpO2 99%   BMI 29.84 kg/m  BMI: Estimated body mass index is 29.84 kg/m as calculated from the  following:   Height as of this encounter: 6' (1.829 m).   Weight as of this encounter: 220 lb (99.8 kg). Ideal: Ideal body weight: 77.6 kg (171 lb 1.2 oz) Adjusted ideal body weight: 86.5 kg (190 lb 10.3 oz)  Lumbar Spine Area Exam  Skin & Axial Inspection: No masses, redness, or swelling Alignment: Symmetrical Functional ROM: Unrestricted ROM       Stability: No instability detected Muscle Tone/Strength: Functionally intact. No obvious neuro-muscular anomalies detected. Sensory (Neurological): Musculoskeletal pain pattern  .5 out of 5 strength bilateral lower extremity: Plantar flexion, dorsiflexion, knee flexion, knee extension.   Assessment   Status Diagnosis  Controlled Controlled Controlled 1. Lumbar facet arthropathy   2. Lumbar degenerative disc disease   3. Localized osteoarthritis of shoulder regions, bilateral   4. Chronic bilateral low back pain with bilateral sciatica   5. Encounter for long-term opiate analgesic use   6. Chronic SI joint pain   7. Chronic pain syndrome        Plan of Care   Charles Mooney has a current medication list which includes the following long-term medication(s): hydrocodone-acetaminophen, [START ON 04/14/2020] hydrocodone-acetaminophen, and [START ON 05/14/2020] hydrocodone-acetaminophen.  Pharmacotherapy (Medications Ordered): Meds ordered this encounter  Medications  . HYDROcodone-acetaminophen (NORCO) 10-325 MG tablet    Sig: Take 1-2 tablets by mouth daily as needed. For chronic pain syndrome    Dispense:  45 tablet    Refill:  0  . HYDROcodone-acetaminophen (NORCO) 10-325 MG tablet    Sig: Take 1-2 tablets by mouth daily as needed. For chronic pain syndrome    Dispense:  45 tablet    Refill:  0  . HYDROcodone-acetaminophen (NORCO) 10-325 MG tablet    Sig: Take 1-2 tablets by mouth daily as needed. For chronic pain syndrome    Dispense:  45 tablet    Refill:  0  . tiZANidine (ZANAFLEX) 4 MG tablet    Sig: Take 1.5  tablets (6 mg total) by mouth 2 (two) times daily as needed for muscle spasms.    Dispense:  60 tablet    Refill:  1    Follow-up plan:   Return in about 3 months (around 06/13/2020) for Medication Management, in person.     Status post bilateral L3, L4, L5, S1 facet  medial branch nerve blocks #1 on 06/01/2019 50% pain relief for 3 days, consider repeating in future or consider sprint peripheral nerve stimulation of medial branch at L4      Recent Visits No visits were found meeting these conditions. Showing recent visits within past 90 days and meeting all other requirements Today's Visits Date Type Provider Dept  03/15/20 Office Visit Gillis Santa, MD [facility]  Showing today's visits and meeting all other requirements Future Appointments No visits were found meeting these conditions. Showing future appointments within next 90 days and meeting all other requirements  I discussed the assessment and treatment plan with the patient. The patient was provided an opportunity to ask questions and all were answered. The patient agreed with the plan and demonstrated an understanding of the instructions.  Patient advised to call back or seek an in-person evaluation if the symptoms or condition worsens.  Duration of encounter:30 minutes.  Note by: Gillis Santa, MD Date: 03/15/2020; Time: 8:53 AM

## 2020-06-07 ENCOUNTER — Encounter: Payer: Self-pay | Admitting: Student in an Organized Health Care Education/Training Program

## 2020-06-07 ENCOUNTER — Other Ambulatory Visit: Payer: Self-pay

## 2020-06-07 ENCOUNTER — Ambulatory Visit
Payer: Medicare Other | Attending: Student in an Organized Health Care Education/Training Program | Admitting: Student in an Organized Health Care Education/Training Program

## 2020-06-07 VITALS — BP 154/101 | HR 80 | Temp 96.8°F | Resp 14 | Ht 72.0 in | Wt 225.0 lb

## 2020-06-07 DIAGNOSIS — M5136 Other intervertebral disc degeneration, lumbar region: Secondary | ICD-10-CM

## 2020-06-07 DIAGNOSIS — M5441 Lumbago with sciatica, right side: Secondary | ICD-10-CM | POA: Insufficient documentation

## 2020-06-07 DIAGNOSIS — G8929 Other chronic pain: Secondary | ICD-10-CM

## 2020-06-07 DIAGNOSIS — M19012 Primary osteoarthritis, left shoulder: Secondary | ICD-10-CM | POA: Diagnosis present

## 2020-06-07 DIAGNOSIS — M19011 Primary osteoarthritis, right shoulder: Secondary | ICD-10-CM

## 2020-06-07 DIAGNOSIS — M5442 Lumbago with sciatica, left side: Secondary | ICD-10-CM | POA: Diagnosis not present

## 2020-06-07 DIAGNOSIS — Z79891 Long term (current) use of opiate analgesic: Secondary | ICD-10-CM | POA: Insufficient documentation

## 2020-06-07 DIAGNOSIS — G894 Chronic pain syndrome: Secondary | ICD-10-CM | POA: Insufficient documentation

## 2020-06-07 DIAGNOSIS — M47816 Spondylosis without myelopathy or radiculopathy, lumbar region: Secondary | ICD-10-CM

## 2020-06-07 DIAGNOSIS — M533 Sacrococcygeal disorders, not elsewhere classified: Secondary | ICD-10-CM | POA: Diagnosis present

## 2020-06-07 MED ORDER — HYDROCODONE-ACETAMINOPHEN 10-325 MG PO TABS: 1.0000 | ORAL_TABLET | Freq: Every day | ORAL | 0 refills | Status: DC | PRN

## 2020-06-07 NOTE — Progress Notes (Signed)
PROVIDER NOTE: Information contained herein reflects review and annotations entered in association with encounter. Interpretation of such information and data should be left to medically-trained personnel. Information provided to patient can be located elsewhere in the medical record under "Patient Instructions". Document created using STT-dictation technology, any transcriptional errors that may result from process are unintentional.    Patient: Charles Mooney  Service Category: E/M  Provider: Gillis Santa, MD  DOB: Jan 19, 1956  DOS: 06/07/2020  Specialty: Interventional Pain Management  MRN: [id number]  Setting: Ambulatory outpatient  PCP: Gale Journey, MD  Type: Established Patient    Referring Provider: Gale Journey, MD  Location: Office  Delivery: Face-to-face     HPI  [name], a 65 y.o. year old male, is here today because of his Lumbar facet arthropathy [M47.816]. Charles Mooney's primary complain today is Back Pain Last encounter: My last encounter with him was on 03/15/2020. Pertinent problems: Charles Mooney has Chronic bilateral low back pain with bilateral sciatica; Acute bursitis of right shoulder; Nonallopathic lesion of sacral region; Lumbar facet arthropathy; Lumbar degenerative disc disease; Localized osteoarthritis of shoulder regions, bilateral; Encounter for long-term opiate analgesic use; Chronic SI joint pain; and Chronic pain syndrome on their pertinent problem list. Pain Assessment: Severity of Chronic pain is reported as a 5 /10. Location: Back Lower/left leg to the knee. Onset: More than a month ago. Quality: Spasm,Cramping,Dull. Timing: Constant. Modifying factor(s): TENS, medications, stretching. Vitals:  height is 6' (1.829 m) and weight is 225 lb (102.1 kg). His temporal temperature is 96.8 F (36 C) (abnormal). His blood pressure is 154/101 (abnormal) and his pulse is 80. His respiration is 14 and oxygen saturation is 99%.   Reason for encounter: medication  management.    No change in medical history since last visit.  Patient's pain is at baseline.  Patient continues multimodal pain regimen as prescribed.  States that it provides pain relief and improvement in functional status. Discussed alternative and complementary pain management strategies including yoga, Accu puncture, acupressure, aquatic therapy.  Pharmacotherapy Assessment   Analgesic: Hydrocodone 10 mg BID prn   Monitoring: [place] PMP: PDMP reviewed during this encounter.       Pharmacotherapy: No side-effects or adverse reactions reported. Compliance: No problems identified. Effectiveness: Clinically acceptable.  Landis Martins, RN  06/07/2020  8:59 AM  Sign when Signing Visit Nursing Pain Medication Assessment:  Safety precautions to be maintained throughout the outpatient stay will include: orient to surroundings, keep bed in low position, maintain call bell within reach at all times, provide assistance with transfer out of bed and ambulation.  Medication Inspection Compliance: Pill count conducted under aseptic conditions, in front of the patient. Neither the pills nor the bottle was removed from the patient's sight at any time. Once count was completed pills were immediately returned to the patient in their original bottle.  Medication: Hydrocodone/APAP Pill/Patch Count: 17 of 45 pills remain Pill/Patch Appearance: Markings consistent with prescribed medication Bottle Appearance: Standard pharmacy container. Clearly labeled. Filled Date: 05/14/2020 Last Medication intake:  Yesterday    UDS:  Summary  Date Value Ref Range Status  04/28/2019 Note  Final    Comment:    ==================================================================== Compliance Drug Analysis, Ur ==================================================================== Test                             Result       Flag       Units Drug Present  Hydrocodone                    924                     ng/mg  creat   Hydromorphone                  215                     ng/mg creat   Dihydrocodeine                 206                     ng/mg creat   Norhydrocodone                 1363                    ng/mg creat    Sources of hydrocodone include scheduled prescription medications.    Hydromorphone, dihydrocodeine and norhydrocodone are expected    metabolites of hydrocodone. Hydromorphone and dihydrocodeine are    also available as scheduled prescription medications.   Methocarbamol                  PRESENT   Acetaminophen                  PRESENT ==================================================================== Test                      Result    Flag   Units      Ref Range   Creatinine              54               mg/dL      >=20 ==================================================================== Declared Medications:  Medication list was not provided. ==================================================================== For clinical consultation, please call [phone]. ====================================================================      ROS  Constitutional: Denies any fever or chills Gastrointestinal: No reported hemesis, hematochezia, vomiting, or acute GI distress Musculoskeletal: Denies any acute onset joint swelling, redness, loss of ROM, or weakness Neurological: No reported episodes of acute onset apraxia, aphasia, dysarthria, agnosia, amnesia, paralysis, loss of coordination, or loss of consciousness  Medication Review  HYDROcodone-acetaminophen, NON FORMULARY, Turmeric, amLODipine, b complex vitamins, cholecalciferol, fexofenadine, fluticasone, meloxicam, and tiZANidine  History Review  Allergy: Charles Mooney is allergic to sulfa antibiotics. Drug: Charles Mooney  reports previous drug use. Alcohol:  reports current alcohol use. Tobacco:  reports that he quit smoking about 4 years ago. He has never used smokeless tobacco. Social: Charles Mooney  reports that he  quit smoking about 4 years ago. He has never used smokeless tobacco. He reports current alcohol use. He reports previous drug use. Medical:  has a past medical history of Hepatitis. Surgical: Charles Mooney  has a past surgical history that includes Brain surgery; Joint replacement (Bilateral); and Shoulder arthroscopy with rotator cuff repair and open biceps tenodesis (Left, 09/08/2018). Family: family history is not on file.  Laboratory Chemistry Profile   Renal Lab Results  Component Value Date   BUN 18 09/04/2018   CREATININE 0.81 09/04/2018   GFRAA >60 09/04/2018   GFRNONAA >60 09/04/2018     Hepatic Lab Results  Component Value Date   AST 18 09/04/2018   ALT 20 09/04/2018   ALBUMIN 4.4 09/04/2018   ALKPHOS 39 09/04/2018  Electrolytes Lab Results  Component Value Date   NA 138 09/04/2018   K 3.9 09/04/2018   CL 105 09/04/2018   CALCIUM 9.2 09/04/2018     Bone No results found for: VD25OH, VD125OH2TOT, [id number], [id number], 25OHVITD1, 25OHVITD2, 25OHVITD3, TESTOFREE, TESTOSTERONE   Inflammation (CRP: Acute Phase) (ESR: Chronic Phase) No results found for: CRP, ESRSEDRATE, LATICACIDVEN     Note: Above Lab results reviewed.  Recent Imaging Review  DG Foot Complete Left Please see detailed radiograph report in office note. Note: Reviewed        Physical Exam  General appearance: Well nourished, well developed, and well hydrated. In no apparent acute distress Mental status: Alert, oriented x 3 (person, place, & time)       Respiratory: No evidence of acute respiratory distress Eyes: PERLA Vitals: BP (!) 154/101   Pulse 80   Temp (!) 96.8 F (36 C) (Temporal)   Resp 14   Ht 6' (1.829 m)   Wt 225 lb (102.1 kg)   SpO2 99%   BMI 30.52 kg/m  BMI: Estimated body mass index is 30.52 kg/m as calculated from the following:   Height as of this encounter: 6' (1.829 m).   Weight as of this encounter: 225 lb (102.1 kg). Ideal: Ideal body weight: 77.6 kg (171 lb 1.2  oz) Adjusted ideal body weight: 87.4 kg (192 lb 10.3 oz)   Lumbar Spine Area Exam  Skin & Axial Inspection: No masses, redness, or swelling Alignment: Symmetrical Functional ROM: Unrestricted ROM       Stability: No instability detected Muscle Tone/Strength: Functionally intact. No obvious neuro-muscular anomalies detected. Sensory (Neurological): Musculoskeletal pain pattern  .5 out of 5 strength bilateral lower extremity: Plantar flexion, dorsiflexion, knee flexion, knee extension.   Assessment   Status Diagnosis  Controlled Controlled Controlled 1. Lumbar facet arthropathy   2. Lumbar degenerative disc disease   3. Localized osteoarthritis of shoulder regions, bilateral   4. Chronic bilateral low back pain with bilateral sciatica   5. Encounter for long-term opiate analgesic use   6. Chronic SI joint pain   7. Chronic pain syndrome       Plan of Care   Charles Mooney has a current medication list which includes the following long-term medication(s): amlodipine, fexofenadine, fluticasone, [START ON 06/13/2020] hydrocodone-acetaminophen, [START ON 07/13/2020] hydrocodone-acetaminophen, and [START ON 08/12/2020] hydrocodone-acetaminophen.  Pharmacotherapy (Medications Ordered): Meds ordered this encounter  Medications  . HYDROcodone-acetaminophen (NORCO) 10-325 MG tablet    Sig: Take 1-2 tablets by mouth daily as needed. For chronic pain syndrome    Dispense:  45 tablet    Refill:  0  . HYDROcodone-acetaminophen (NORCO) 10-325 MG tablet    Sig: Take 1-2 tablets by mouth daily as needed. For chronic pain syndrome    Dispense:  45 tablet    Refill:  0  . HYDROcodone-acetaminophen (NORCO) 10-325 MG tablet    Sig: Take 1-2 tablets by mouth daily as needed. For chronic pain syndrome    Dispense:  45 tablet    Refill:  0   Orders:  Orders Placed This Encounter  Procedures  . ToxASSURE Select 13 (MW), Urine    Volume: 30 ml(s). Minimum 3 ml of urine is  needed. Document temperature of fresh sample. Indications: Long term (current) use of opiate analgesic (W96.759)    Order Specific Question:   Release to patient    Answer:   Immediate   Follow-up plan:   Return in about 3 months (around 09/06/2020) for  Medication Management, in person.     Status post bilateral L3, L4, L5, S1 facet  medial branch nerve blocks #1 on 06/01/2019 50% pain relief for 3 days, consider repeating in future or consider sprint peripheral nerve stimulation of medial branch at L4       Recent Visits Date Type Provider Dept  03/15/20 Office Visit Gillis Santa, MD [facility]  Showing recent visits within past 90 days and meeting all other requirements Today's Visits Date Type Provider Dept  06/07/20 Office Visit Gillis Santa, MD [facility]  Showing today's visits and meeting all other requirements Future Appointments No visits were found meeting these conditions. Showing future appointments within next 90 days and meeting all other requirements  I discussed the assessment and treatment plan with the patient. The patient was provided an opportunity to ask questions and all were answered. The patient agreed with the plan and demonstrated an understanding of the instructions.  Patient advised to call back or seek an in-person evaluation if the symptoms or condition worsens.  Duration of encounter: 30 minutes.  Note by: Gillis Santa, MD Date: 06/07/2020; Time: 9:40 AM

## 2020-06-07 NOTE — Progress Notes (Signed)
Nursing Pain Medication Assessment:  Safety precautions to be maintained throughout the outpatient stay will include: orient to surroundings, keep bed in low position, maintain call bell within reach at all times, provide assistance with transfer out of bed and ambulation.  Medication Inspection Compliance: Pill count conducted under aseptic conditions, in front of the patient. Neither the pills nor the bottle was removed from the patient's sight at any time. Once count was completed pills were immediately returned to the patient in their original bottle.  Medication: Hydrocodone/APAP Pill/Patch Count: 17 of 45 pills remain Pill/Patch Appearance: Markings consistent with prescribed medication Bottle Appearance: Standard pharmacy container. Clearly labeled. Filled Date: 05/14/2020 Last Medication intake:  Burgess Estelle

## 2020-06-14 LAB — TOXASSURE SELECT 13 (MW), URINE

## 2020-07-12 ENCOUNTER — Other Ambulatory Visit: Payer: Self-pay | Admitting: Student in an Organized Health Care Education/Training Program

## 2020-07-12 DIAGNOSIS — M47816 Spondylosis without myelopathy or radiculopathy, lumbar region: Secondary | ICD-10-CM

## 2020-07-12 DIAGNOSIS — G894 Chronic pain syndrome: Secondary | ICD-10-CM

## 2020-07-12 DIAGNOSIS — M5136 Other intervertebral disc degeneration, lumbar region: Secondary | ICD-10-CM

## 2020-09-08 ENCOUNTER — Ambulatory Visit
Payer: Medicare Other | Attending: Student in an Organized Health Care Education/Training Program | Admitting: Student in an Organized Health Care Education/Training Program

## 2020-09-08 ENCOUNTER — Encounter: Payer: Self-pay | Admitting: Student in an Organized Health Care Education/Training Program

## 2020-09-08 ENCOUNTER — Other Ambulatory Visit: Payer: Self-pay

## 2020-09-08 VITALS — BP 143/98 | HR 64 | Temp 97.0°F | Resp 14 | Ht 72.0 in | Wt 221.0 lb

## 2020-09-08 DIAGNOSIS — M19011 Primary osteoarthritis, right shoulder: Secondary | ICD-10-CM | POA: Diagnosis present

## 2020-09-08 DIAGNOSIS — M47816 Spondylosis without myelopathy or radiculopathy, lumbar region: Secondary | ICD-10-CM | POA: Diagnosis present

## 2020-09-08 DIAGNOSIS — G894 Chronic pain syndrome: Secondary | ICD-10-CM | POA: Diagnosis present

## 2020-09-08 DIAGNOSIS — M5136 Other intervertebral disc degeneration, lumbar region: Secondary | ICD-10-CM | POA: Diagnosis present

## 2020-09-08 DIAGNOSIS — M19012 Primary osteoarthritis, left shoulder: Secondary | ICD-10-CM | POA: Diagnosis present

## 2020-09-08 MED ORDER — TIZANIDINE HCL 4 MG PO TABS: 6.0000 mg | ORAL_TABLET | Freq: Two times a day (BID) | ORAL | 1 refills | Status: DC | PRN

## 2020-09-08 MED ORDER — HYDROCODONE-ACETAMINOPHEN 10-325 MG PO TABS: 1.0000 | ORAL_TABLET | Freq: Every day | ORAL | 0 refills | Status: DC | PRN

## 2020-09-08 NOTE — Progress Notes (Signed)
PROVIDER NOTE: Information contained herein reflects review and annotations entered in association with encounter. Interpretation of such information and data should be left to medically-trained personnel. Information provided to patient can be located elsewhere in the medical record under "Patient Instructions". Document created using STT-dictation technology, any transcriptional errors that may result from process are unintentional.    Patient: Charles Mooney  Service Category: E/M  Provider: Gillis Santa, MD  DOB: [birth year]  DOS: 09/08/2020  Specialty: Interventional Pain Management  MRN: [id number]  Setting: Ambulatory outpatient  PCP: Gale Journey, MD  Type: Established Patient    Referring Provider: Gale Journey, MD  Location: Office  Delivery: Face-to-face     HPI  Mr. COLESON KANT, a 65 y.o. 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 Mr. Done's primary complain today is Back Pain (lower), Shoulder Pain (bilateral), and Wrist Pain (left) Last encounter: My last encounter with him was on 06/07/2020. Pertinent problems: Mr. Labrosse has Chronic bilateral low back pain with bilateral sciatica; Acute bursitis of right shoulder; Nonallopathic lesion of sacral region; Lumbar facet arthropathy; Lumbar degenerative disc disease; Arthritis of right glenohumeral joint; Encounter for long-term opiate analgesic use; Chronic SI joint pain; and Chronic pain syndrome on their pertinent problem list. Pain Assessment: Severity of Chronic pain is reported as a 4 /10. Location: Back Lower/ . Onset: More than a month ago. Quality: Sharp, Constant. Timing: Constant. Modifying factor(s): ice, stretching, medications. Vitals:  height is 6' (1.829 m) and weight is 221 lb (100.2 kg). His temporal temperature is 97 F (36.1 C) (abnormal). His blood pressure is 143/98 (abnormal) and his pulse is 64. His respiration is 14 and oxygen saturation is 98%.   Reason  for encounter: medication management.    No change in medical history since last visit.  Patient's pain is at baseline.  Patient continues multimodal pain regimen as prescribed.  States that it provides pain relief and improvement in functional status. Having increased right shoulder pain, has had right shoulder injection in past with limited response (approx 2 years ago) and also has done PT in the past Discussed repeating. Also discussed right suprascapular nerve block. Will start with right shoulder joint injection    Pharmacotherapy Assessment   Analgesic: Hydrocodone 10 mg BID prn   Monitoring: [place] PMP: PDMP reviewed during this encounter.       Pharmacotherapy: No side-effects or adverse reactions reported. Compliance: No problems identified. Effectiveness: Clinically acceptable.  Landis Martins, RN  09/08/2020  8:33 AM  Sign when Signing Visit Nursing Pain Medication Assessment:  Safety precautions to be maintained throughout the outpatient stay will include: orient to surroundings, keep bed in low position, maintain call bell within reach at all times, provide assistance with transfer out of bed and ambulation.  Medication Inspection Compliance: Pill count conducted under aseptic conditions, in front of the patient. Neither the pills nor the bottle was removed from the patient's sight at any time. Once count was completed pills were immediately returned to the patient in their original bottle.  Medication: Hydrocodone/APAP Pill/Patch Count:  5 of 45 pills remain Pill/Patch Appearance: Markings consistent with prescribed medication Bottle Appearance: Standard pharmacy container. Clearly labeled. Filled Date: 06 / 24 / 2022 Last Medication intake:  Yesterday      UDS:  Summary  Date Value Ref Range Status  06/07/2020 Note  Final    Comment:    ==================================================================== [employer] [facility] 13  (MW) ==================================================================== Specimen Alert Note:  Urinary creatinine is low; ability to detect some drugs may be compromised. Interpret results with caution. (Creatinine) ==================================================================== Test                             Result       Flag       Units  Drug Present and Declared for Prescription Verification   Norhydrocodone                 611          EXPECTED   ng/mg creat    Norhydrocodone is an expected metabolite of hydrocodone.  Drug Absent but Declared for Prescription Verification   Hydrocodone                    Not Detected UNEXPECTED ng/mg creat    Hydrocodone is almost always present in patients taking this drug    consistently. Absence of hydrocodone could be due to lapse of time    since the last dose or unusual pharmacokinetics (rapid metabolism).  ==================================================================== Test                      Result    Flag   Units      Ref Range   Creatinine              18        LL     mg/dL      >=20 ==================================================================== Declared Medications:  The flagging and interpretation on this report are based on the  following declared medications.  Unexpected results may arise from  inaccuracies in the declared medications.   **Note: The testing scope of this panel includes these medications:   Hydrocodone (Norco)   **Note: The testing scope of this panel does not include the  following reported medications:   Acetaminophen (Norco)  Amlodipine (Norvasc)  Fexofenadine (Allegra)  Fluticasone (Flonase)  Meloxicam (Mobic)  Supplement  Tizanidine (Zanaflex)  Turmeric  Vitamin B  Vitamin D3 ==================================================================== For clinical consultation, please call [phone]. ====================================================================      ROS   Constitutional: Denies any fever or chills Gastrointestinal: No reported hemesis, hematochezia, vomiting, or acute GI distress Musculoskeletal:  Right shoulder pain worse with abduction Neurological: No reported episodes of acute onset apraxia, aphasia, dysarthria, agnosia, amnesia, paralysis, loss of coordination, or loss of consciousness  Medication Review  HYDROcodone-acetaminophen, NON FORMULARY, Turmeric, amLODipine, b complex vitamins, cholecalciferol, fexofenadine, fluticasone, levocetirizine, meloxicam, montelukast, and tiZANidine  History Review  Allergy: Mr. Demma is allergic to sulfa antibiotics. Drug: Mr. Stetzer  reports previous drug use. Alcohol:  reports current alcohol use. Tobacco:  reports that he quit smoking about 4 years ago. He has never used smokeless tobacco. Social: Mr. Grabel  reports that he quit smoking about 4 years ago. He has never used smokeless tobacco. He reports current alcohol use. He reports previous drug use. Medical:  has a past medical history of Hepatitis. Surgical: Mr. Sainato  has a past surgical history that includes Brain surgery; Joint replacement (Bilateral); and Shoulder arthroscopy with rotator cuff repair and open biceps tenodesis (Left, 09/08/2018). Family: family history is not on file.  Laboratory Chemistry Profile   Renal Lab Results  Component Value Date   BUN 18 09/04/2018   CREATININE 0.81 09/04/2018   GFRAA >60 09/04/2018   GFRNONAA >60 09/04/2018     Hepatic Lab Results  Component Value Date  AST 18 09/04/2018   ALT 20 09/04/2018   ALBUMIN 4.4 09/04/2018   ALKPHOS 39 09/04/2018     Electrolytes Lab Results  Component Value Date   NA 138 09/04/2018   K 3.9 09/04/2018   CL 105 09/04/2018   CALCIUM 9.2 09/04/2018     Bone No results found for: VD25OH, VD125OH2TOT, VQ2595GL8, VF6433IR5, 25OHVITD1, 25OHVITD2, 25OHVITD3, TESTOFREE, TESTOSTERONE   Inflammation (CRP: Acute Phase) (ESR: Chronic Phase) No results  found for: CRP, ESRSEDRATE, LATICACIDVEN     Note: Above Lab results reviewed.  Recent Imaging Review  DG Foot Complete Left Please see detailed radiograph report in office note. Note: Reviewed        CLINICAL DATA:  Right shoulder pain and limited range of motion for 6 months. No known injury.   EXAM: MRI OF THE RIGHT SHOULDER WITHOUT CONTRAST   TECHNIQUE: Multiplanar, multisequence MR imaging of the shoulder was performed. No intravenous contrast was administered.   COMPARISON:  None.   FINDINGS: Rotator cuff: There is extensive heterogeneous increased T2 signal and thickening of the rotator cuff tendons consistent with severe tendinopathy. Tendinopathy is worst in supraspinatus where virtually no normal-appearing tendon is seen.   Muscles: Intermediate intensity edema is seen in the supraspinatus muscle belly and there is very mild atrophy of the supraspinatus.   Biceps long head: There is severe tendinopathy of both the intra and extra-articular segments, worse in the intra-articular segment.   Acromioclavicular Joint: Moderately severe degenerative change is present. Type 1 acromion. There is subacromial spurring. A large volume of fluid is seen in the subacromial/subdeltoid bursa   Glenohumeral Joint: Mild degenerative change is seen. Prominent fluid is noted in the subscapularis recess.   Labrum: The superior labrum is degenerated but no tear is identified.   Bones:  No fracture or focal lesion.   Other: None.   IMPRESSION: Severe rotator cuff tendinopathy is worst in the supraspinatus where virtually no normal-appearing tendon is identified but no focal tear is seen. There is mild atrophy of the supraspinatus muscle belly.   Tendinopathy of the intra and extra-articular long head of biceps is severe in the intra-articular segment.   Moderately severe acromioclavicular osteoarthritis. Subacromial spurring is noted.   Large volume of  subacromial/subdeltoid fluid consistent with bursitis.     Electronically Signed   By: Inge Rise M.D.   On: 11/21/2017 09:08    Physical Exam  General appearance: Well nourished, well developed, and well hydrated. In no apparent acute distress Mental status: Alert, oriented x 3 (person, place, & time)       Respiratory: No evidence of acute respiratory distress Eyes: PERLA Vitals: BP (!) 143/98   Pulse 64   Temp (!) 97 F (36.1 C) (Temporal)   Resp 14   Ht 6' (1.829 m)   Wt 221 lb (100.2 kg)   SpO2 98%   BMI 29.97 kg/m  BMI: Estimated body mass index is 29.97 kg/m as calculated from the following:   Height as of this encounter: 6' (1.829 m).   Weight as of this encounter: 221 lb (100.2 kg). Ideal: Ideal body weight: 77.6 kg (171 lb 1.2 oz) Adjusted ideal body weight: 86.7 kg (191 lb 0.7 oz)  Upper Extremity (UE) Exam    Side: Right upper extremity  Side: Left upper extremity  Skin & Extremity Inspection: Skin color, temperature, and hair growth are WNL. No peripheral edema or cyanosis. No masses, redness, swelling, asymmetry, or associated skin lesions. No contractures.  Skin &  Extremity Inspection: Skin color, temperature, and hair growth are WNL. No peripheral edema or cyanosis. No masses, redness, swelling, asymmetry, or associated skin lesions. No contractures.  Functional ROM: Pain restricted ROM          Functional ROM: Unrestricted ROM          Muscle Tone/Strength: Functionally intact. No obvious neuro-muscular anomalies detected.  Muscle Tone/Strength: Functionally intact. No obvious neuro-muscular anomalies detected.  Sensory (Neurological): Unimpaired          Sensory (Neurological): Unimpaired          Palpation: No palpable anomalies              Palpation: No palpable anomalies              Provocative Test(s):  Phalen's test: deferred Tinel's test: deferred Apley's scratch test (touch opposite shoulder):  Action 1 (Across chest): Decreased ROM Action 2  (Overhead): Decreased ROM Action 3 (LB reach): Decreased ROM   Provocative Test(s):  Phalen's test: deferred Tinel's test: deferred Apley's scratch test (touch opposite shoulder):  Action 1 (Across chest): deferred Action 2 (Overhead): deferred Action 3 (LB reach): deferred     Lumbar Spine Area Exam  Skin & Axial Inspection: No masses, redness, or swelling Alignment: Symmetrical Functional ROM: Unrestricted ROM       Stability: No instability detected Muscle Tone/Strength: Functionally intact. No obvious neuro-muscular anomalies detected. Sensory (Neurological): Musculoskeletal pain pattern pain with facet loading   .5 out of 5 strength bilateral lower extremity: Plantar flexion, dorsiflexion, knee flexion, knee extension.    Assessment   Status Diagnosis  Having a Flare-up Having a Flare-up Having a Flare-up 1. Arthritis of right shoulder region   2. Arthritis of right glenohumeral joint   3. Lumbar facet arthropathy   4. Lumbar degenerative disc disease   5. Localized osteoarthritis of shoulder regions, bilateral   6. Chronic pain syndrome        Plan of Care   Mr. CEASER EBELING has a current medication list which includes the following long-term medication(s): amlodipine, fexofenadine, fluticasone, levocetirizine, montelukast, hydrocodone-acetaminophen, [START ON 09/11/2020] hydrocodone-acetaminophen, [START ON 10/11/2020] hydrocodone-acetaminophen, and [START ON 11/10/2020] hydrocodone-acetaminophen.  1.  Right shoulder arthropathy, right rotator cuff tendinopathy: Patient has tried physical therapy in the past and is tried a right shoulder steroid injection over 2 years ago.  He states that this was not under fluoroscopy.  We discussed repeating right shoulder steroid injection under fluoroscopy as well as suprascapular nerve block.  Patient would like to start with right shoulder steroid injection.  Risks and benefits reviewed.  2.  Discussed splint peripheral nerve  stimulation of lumbar medial branch for low back pain related to lumbar facet arthropathy.  Resources provided.  3.  Refill hydrocodone and tizanidine as below.  No change in dose.  Pharmacotherapy (Medications Ordered): Meds ordered this encounter  Medications   HYDROcodone-acetaminophen (NORCO) 10-325 MG tablet    Sig: Take 1-2 tablets by mouth daily as needed. For chronic pain syndrome    Dispense:  45 tablet    Refill:  0   HYDROcodone-acetaminophen (NORCO) 10-325 MG tablet    Sig: Take 1-2 tablets by mouth daily as needed. For chronic pain syndrome    Dispense:  45 tablet    Refill:  0   HYDROcodone-acetaminophen (NORCO) 10-325 MG tablet    Sig: Take 1-2 tablets by mouth daily as needed. For chronic pain syndrome    Dispense:  45 tablet    Refill:  0   tiZANidine (ZANAFLEX) 4 MG tablet    Sig: Take 1.5 tablets (6 mg total) by mouth 2 (two) times daily as needed for muscle spasms.    Dispense:  60 tablet    Refill:  1    Orders:  Orders Placed This Encounter  Procedures   SHOULDER INJECTION    Standing Status:   Future    Standing Expiration Date:   11/09/2020    Scheduling Instructions:     Procedure: Intra-articular shoulder (Glenohumeral) joint injection     Side: RIGHT     Level: Glenohumeral joint               Sedation: Patient's choice.     Timeframe: As permitted by the schedule    Order Specific Question:   Where will this procedure be performed?    Answer:   [facility]    Follow-up plan:   Return in about 1 week (around 09/15/2020) for Right shoulder injection.     Status post bilateral L3, L4, L5, S1 facet  medial branch nerve blocks #1 on 06/01/2019 50% pain relief for 3 days, consider repeating in future or consider sprint peripheral nerve stimulation of medial branch at L4       Recent Visits No visits were found meeting these conditions. Showing recent visits within past 90 days and meeting all other requirements Today's Visits Date Type  Provider Dept  09/08/20 Office Visit Gillis Santa, MD [facility]  Showing today's visits and meeting all other requirements Future Appointments Date Type Provider Dept  09/14/20 Appointment Gillis Santa, MD [facility]  Showing future appointments within next 90 days and meeting all other requirements I discussed the assessment and treatment plan with the patient. The patient was provided an opportunity to ask questions and all were answered. The patient agreed with the plan and demonstrated an understanding of the instructions.  Patient advised to call back or seek an in-person evaluation if the symptoms or condition worsens.  Duration of encounter: 30 minutes.  Note by: Gillis Santa, MD Date: 09/08/2020; Time: 10:16 AM

## 2020-09-08 NOTE — Progress Notes (Signed)
Nursing Pain Medication Assessment:  Safety precautions to be maintained throughout the outpatient stay will include: orient to surroundings, keep bed in low position, maintain call bell within reach at all times, provide assistance with transfer out of bed and ambulation.  Medication Inspection Compliance: Pill count conducted under aseptic conditions, in front of the patient. Neither the pills nor the bottle was removed from the patient's sight at any time. Once count was completed pills were immediately returned to the patient in their original bottle.  Medication: Hydrocodone/APAP Pill/Patch Count:  5 of 45 pills remain Pill/Patch Appearance: Markings consistent with prescribed medication Bottle Appearance: Standard pharmacy container. Clearly labeled. Filled Date: 06 / 24 / 2022 Last Medication intake:  Yesterday

## 2020-09-08 NOTE — Patient Instructions (Signed)
____________________________________________________________________________________________  General Risks and Possible Complications  Patient Responsibilities: It is important that you read this as it is part of your informed consent. It is our duty to inform you of the risks and possible complications associated with treatments offered to you. It is your responsibility as a patient to read this and to ask questions about anything that is not clear or that you believe was not covered in this document.  Patient's Rights: You have the right to refuse treatment. You also have the right to change your mind, even after initially having agreed to have the treatment done. However, under this last option, if you wait until the last second to change your mind, you may be charged for the materials used up to that point.  Introduction: Medicine is not an exact science. Everything in Medicine, including the lack of treatment(s), carries the potential for danger, harm, or loss (which is by definition: Risk). In Medicine, a complication is a secondary problem, condition, or disease that can aggravate an already existing one. All treatments carry the risk of possible complications. The fact that a side effects or complications occurs, does not imply that the treatment was conducted incorrectly. It must be clearly understood that these can happen even when everything is done following the highest safety standards.  No treatment: You can choose not to proceed with the proposed treatment alternative. The "PRO(s)" would include: avoiding the risk of complications associated with the therapy. The "CON(s)" would include: not getting any of the treatment benefits. These benefits fall under one of three categories: diagnostic; therapeutic; and/or palliative. Diagnostic benefits include: getting information which can ultimately lead to improvement of the disease or symptom(s). Therapeutic benefits are those associated with  the successful treatment of the disease. Finally, palliative benefits are those related to the decrease of the primary symptoms, without necessarily curing the condition (example: decreasing the pain from a flare-up of a chronic condition, such as incurable terminal cancer).  General Risks and Complications: These are associated to most interventional treatments. They can occur alone, or in combination. They fall under one of the following six (6) categories: no benefit or worsening of symptoms; bleeding; infection; nerve damage; allergic reactions; and/or death. No benefits or worsening of symptoms: In Medicine there are no guarantees, only probabilities. No healthcare provider can ever guarantee that a medical treatment will work, they can only state the probability that it may. Furthermore, there is always the possibility that the condition may worsen, either directly, or indirectly, as a consequence of the treatment. Bleeding: This is more common if the patient is taking a blood thinner, either prescription or over the counter (example: Goody Powders, Fish oil, Aspirin, Garlic, etc.), or if suffering a condition associated with impaired coagulation (example: Hemophilia, cirrhosis of the liver, low platelet counts, etc.). However, even if you do not have one on these, it can still happen. If you have any of these conditions, or take one of these drugs, make sure to notify your treating physician. Infection: This is more common in patients with a compromised immune system, either due to disease (example: diabetes, cancer, human immunodeficiency virus [HIV], etc.), or due to medications or treatments (example: therapies used to treat cancer and rheumatological diseases). However, even if you do not have one on these, it can still happen. If you have any of these conditions, or take one of these drugs, make sure to notify your treating physician. Nerve Damage: This is more common when the treatment is an    invasive one, but it can also happen with the use of medications, such as those used in the treatment of cancer. The damage can occur to small secondary nerves, or to large primary ones, such as those in the spinal cord and brain. This damage may be temporary or permanent and it may lead to impairments that can range from temporary numbness to permanent paralysis and/or brain death. Allergic Reactions: Any time a substance or material comes in contact with our body, there is the possibility of an allergic reaction. These can range from a mild skin rash (contact dermatitis) to a severe systemic reaction (anaphylactic reaction), which can result in death. Death: In general, any medical intervention can result in death, most of the time due to an unforeseen complication. ____________________________________________________________________________________________    

## 2020-09-14 ENCOUNTER — Ambulatory Visit
Admission: RE | Admit: 2020-09-14 | Discharge: 2020-09-14 | Disposition: A | Discharge status: Home / Self Care | Payer: Medicare Other | Source: Ambulatory Visit | Attending: Student in an Organized Health Care Education/Training Program | Admitting: Student in an Organized Health Care Education/Training Program

## 2020-09-14 ENCOUNTER — Ambulatory Visit (HOSPITAL_BASED_OUTPATIENT_CLINIC_OR_DEPARTMENT_OTHER): Payer: Medicare Other | Admitting: Student in an Organized Health Care Education/Training Program

## 2020-09-14 ENCOUNTER — Other Ambulatory Visit: Payer: Self-pay

## 2020-09-14 ENCOUNTER — Encounter: Payer: Self-pay | Admitting: Student in an Organized Health Care Education/Training Program

## 2020-09-14 VITALS — BP 136/101 | HR 73 | Temp 97.2°F | Resp 20 | Ht 72.0 in | Wt 220.0 lb

## 2020-09-14 DIAGNOSIS — G894 Chronic pain syndrome: Secondary | ICD-10-CM

## 2020-09-14 DIAGNOSIS — M19011 Primary osteoarthritis, right shoulder: Secondary | ICD-10-CM | POA: Insufficient documentation

## 2020-09-14 MED ORDER — ROPIVACAINE HCL 2 MG/ML IJ SOLN
4.0000 mL | Freq: Once | INTRAMUSCULAR | Status: AC
  Administered 2020-09-14: 4 mL via INTRA_ARTICULAR

## 2020-09-14 MED ORDER — METHYLPREDNISOLONE ACETATE 40 MG/ML IJ SUSP
40.0000 mg | Freq: Once | INTRAMUSCULAR | Status: AC
  Administered 2020-09-14: 40 mg via INTRA_ARTICULAR
  Filled 2020-09-14: qty 1

## 2020-09-14 MED ORDER — IOHEXOL 180 MG/ML  SOLN
10.0000 mL | Freq: Once | INTRAMUSCULAR | Status: AC
  Administered 2020-09-14: 5 mL via INTRA_ARTICULAR

## 2020-09-14 NOTE — Progress Notes (Deleted)
PROVIDER NOTE: Information contained herein reflects review and annotations entered in association with encounter. Interpretation of such information and data should be left to medically-trained personnel. Information provided to patient can be located elsewhere in the medical record under "Patient Instructions". Document created using STT-dictation technology, any transcriptional errors that may result from process are unintentional.    Patient: Charles Mooney  Service Category: Procedure  Provider: Edward Jolly, MD  DOB: [birth year]  DOS: 09/14/2020  Location: [facility]  MRN: [id number]  Setting: Ambulatory - outpatient  Referring Provider: Tiana Loft, MD  Type: Established Patient  Specialty: Interventional Pain Management  PCP: Tiana Loft, MD   Primary Reason for Visit: Interventional Pain Management Treatment. CC: Shoulder Pain (right), Ankle Pain (bilateral), Back Pain (low), and Wrist Pain (left)  Procedure:          Anesthesia, Analgesia, Anxiolysis:  Type: Diagnostic Glenohumeral Joint (shoulder) Injection          Primary Purpose: Diagnostic Region: Superior Shoulder Area Level:  Shoulder Target Area: Glenohumeral Joint (shoulder) Approach: Anterior approach. Laterality: Right-Sided  Type: Minimal (Conscious) Anxiolysis combined with Local Anesthesia Indication(s): Analgesia and Anxiety Route: Oral (PO) IV Access: Secured Sedation: Meaningful verbal contact was maintained at all times during the procedure  Local Anesthetic: Lidocaine 1-2%  Position: Supine   Indications: 1. Arthritis of right shoulder region   2. Arthritis of right glenohumeral joint   3. Chronic pain syndrome    Pain Score: Pre-procedure: 6 /10 Post-procedure: 6 /10   Pre-op H&P Assessment:  Charles Mooney is a 65 y.o. (year old), male patient, seen today for interventional treatment. He  has a past surgical history that includes Brain surgery; Joint replacement (Bilateral);  and Shoulder arthroscopy with rotator cuff repair and open biceps tenodesis (Left, 09/08/2018). Charles Mooney has a current medication list which includes the following prescription(s): amlodipine, b complex vitamins, cholecalciferol, fexofenadine, fluticasone, hydrocodone-acetaminophen, [START ON 10/11/2020] hydrocodone-acetaminophen, [START ON 11/10/2020] hydrocodone-acetaminophen, levocetirizine, meloxicam, montelukast, NON FORMULARY, tizanidine, turmeric, and hydrocodone-acetaminophen, and the following Facility-Administered Medications: iohexol, methylprednisolone acetate, and ropivacaine (pf) 2 mg/ml (0.2%). His primarily concern today is the Shoulder Pain (right), Ankle Pain (bilateral), Back Pain (low), and Wrist Pain (left)  Initial Vital Signs:  Pulse/HCG Rate: 73  Temp: (!) 97.2 F (36.2 C) Resp: 16 BP: (!) 149/103 SpO2: 99 %  BMI: Estimated body mass index is 29.84 kg/m as calculated from the following:   Height as of this encounter: 6' (1.829 m).   Weight as of this encounter: 220 lb (99.8 kg).  Risk Assessment: Allergies: Reviewed. He is allergic to sulfa antibiotics.  Allergy Precautions: None required Coagulopathies: Reviewed. None identified.  Blood-thinner therapy: None at this time Active Infection(s): Reviewed. None identified. Charles Mooney is afebrile  Site Confirmation: Charles Mooney was asked to confirm the procedure and laterality before marking the site Procedure checklist: Completed Consent: Before the procedure and under the influence of no sedative(s), amnesic(s), or anxiolytics, the patient was informed of the treatment options, risks and possible complications. To fulfill our ethical and legal obligations, as recommended by the American Medical Association's Code of Ethics, I have informed the patient of my clinical impression; the nature and purpose of the treatment or procedure; the risks, benefits, and possible complications of the intervention; the alternatives,  including doing nothing; the risk(s) and benefit(s) of the alternative treatment(s) or procedure(s); and the risk(s) and benefit(s) of doing nothing. The patient was provided information about the general risks and possible complications associated with  the procedure. These may include, but are not limited to: failure to achieve desired goals, infection, bleeding, organ or nerve damage, allergic reactions, paralysis, and death. In addition, the patient was informed of those risks and complications associated to the procedure, such as failure to decrease pain; infection; bleeding; organ or nerve damage with subsequent damage to sensory, motor, and/or autonomic systems, resulting in permanent pain, numbness, and/or weakness of one or several areas of the body; allergic reactions; (i.e.: anaphylactic reaction); and/or death. Furthermore, the patient was informed of those risks and complications associated with the medications. These include, but are not limited to: allergic reactions (i.e.: anaphylactic or anaphylactoid reaction(s)); adrenal axis suppression; blood sugar elevation that in diabetics may result in ketoacidosis or comma; water retention that in patients with history of congestive heart failure may result in shortness of breath, pulmonary edema, and decompensation with resultant heart failure; weight gain; swelling or edema; medication-induced neural toxicity; particulate matter embolism and blood vessel occlusion with resultant organ, and/or nervous system infarction; and/or aseptic necrosis of one or more joints. Finally, the patient was informed that Medicine is not an exact science; therefore, there is also the possibility of unforeseen or unpredictable risks and/or possible complications that may result in a catastrophic outcome. The patient indicated having understood very clearly. We have given the patient no guarantees and we have made no promises. Enough time was given to the patient to ask  questions, all of which were answered to the patient's satisfaction. Charles Mooney has indicated that he wanted to continue with the procedure. Attestation: I, the ordering provider, attest that I have discussed with the patient the benefits, risks, side-effects, alternatives, likelihood of achieving goals, and potential problems during recovery for the procedure that I have provided informed consent. Date  Time: {CHL [facility] TIME CHOICES:[id number]}  Pre-Procedure Preparation:  Monitoring: As per clinic protocol. Respiration, ETCO2, SpO2, BP, heart rate and rhythm monitor placed and checked for adequate function Safety Precautions: Patient was assessed for positional comfort and pressure points before starting the procedure. Time-out: I initiated and conducted the "Time-out" before starting the procedure, as per protocol. The patient was asked to participate by confirming the accuracy of the "Time Out" information. Verification of the correct person, site, and procedure were performed and confirmed by me, the nursing staff, and the patient. "Time-out" conducted as per Joint Commission's Universal Protocol (UP.01.01.01). Time:    Description of Procedure:          Area Prepped: Entire shoulder Area DuraPrep (Iodine Povacrylex [0.7% available iodine] and Isopropyl Alcohol, 74% w/w) Safety Precautions: Aspiration looking for blood return was conducted prior to all injections. At no point did we inject any substances, as a needle was being advanced. No attempts were made at seeking any paresthesias. Safe injection practices and needle disposal techniques used. Medications properly checked for expiration dates. SDV (single dose vial) medications used. Description of the Procedure: Protocol guidelines were followed. The patient was placed in position over the procedure table. The target area was identified and the area prepped in the usual manner. Skin & deeper tissues infiltrated with local anesthetic.  Appropriate amount of time allowed to pass for local anesthetics to take effect. The procedure needles were then advanced to the target area. Proper needle placement secured. Negative aspiration confirmed. Solution injected in intermittent fashion, asking for systemic symptoms every 0.5cc of injectate. The needles were then removed and the area cleansed, making sure to leave some of the prepping solution back to take advantage of  its long term bactericidal properties.         Vitals:   09/14/20 0943 09/14/20 0945  BP: (!) 149/103 (!) 148/89  Pulse: 73   Resp: 16   Temp: (!) 97.2 F (36.2 C)   SpO2: 99%   Weight: 220 lb (99.8 kg)   Height: 6' (1.829 m)     Start Time:   hrs. End Time:   hrs. Materials:  Needle(s) Type: Spinal Needle Gauge: 22G Length: 3.5-in Medication(s): Please see orders for medications and dosing details.  Imaging Guidance (Non-Spinal):          Type of Imaging Technique: Fluoroscopy Guidance (Non-Spinal) Indication(s): Assistance in needle guidance and placement for procedures requiring needle placement in or near specific anatomical locations not easily accessible without such assistance. Exposure Time: Please see nurses notes. Contrast: None used. Fluoroscopic Guidance: I was personally present during the use of fluoroscopy. "Tunnel Vision Technique" used to obtain the best possible view of the target area. Parallax error corrected before commencing the procedure. "Direction-depth-direction" technique used to introduce the needle under continuous pulsed fluoroscopy. Once target was reached, antero-posterior, oblique, and lateral fluoroscopic projection used confirm needle placement in all planes. Images permanently stored in EMR. Interpretation: No contrast injected. I personally interpreted the imaging intraoperatively. Adequate needle placement confirmed in multiple planes. Permanent images saved into the patient's record.   Post-operative Assessment:   Post-procedure Vital Signs:  Pulse/HCG Rate: 73  Temp: (!) 97.2 F (36.2 C) Resp: 16 BP: (!) 148/89 SpO2: 99 %  EBL: None  Complications: No immediate post-treatment complications observed by team, or reported by patient.  Note: The patient tolerated the entire procedure well. A repeat set of vitals were taken after the procedure and the patient was kept under observation following institutional policy, for this type of procedure. Post-procedural neurological assessment was performed, showing return to baseline, prior to discharge. The patient was provided with post-procedure discharge instructions, including a section on how to identify potential problems. Should any problems arise concerning this procedure, the patient was given instructions to immediately contact us, at any time, without hesitation. In any case, we plan to contact the patient by telephone for a follow-up status report regarding this interventional procedure.  Comments:  No additional relevant information.  Plan of Care  Orders:  Orders Placed This Encounter  Procedures   DG PAIN CLINIC C-ARM 1-60 MIN NO REPORT    Intraoperative interpretation by procedural physician at [facility].    Standing Status:   Standing    Number of Occurrences:   1    Order Specific Question:   Reason for exam:    Answer:   Assistance in needle guidance and placement for procedures requiring needle placement in or near specific anatomical locations not easily accessible without such assistance.   Chronic Opioid Analgesic:  Hydrocodone 10 mg BID prn   Medications ordered for procedure: Meds ordered this encounter  Medications   iohexol (OMNIPAQUE) 180 MG/ML injection 10 mL    Must be Myelogram-compatible. If not available, you may substitute with a water-soluble, non-ionic, hypoallergenic, myelogram-compatible radiological contrast medium.   methylPREDNISolone acetate (DEPO-MEDROL) injection 40 mg   ropivacaine (PF) 2 mg/mL  (0.2%) (NAROPIN) injection 4 mL   Medications administered: Charles AxonJames S. Vigo "[name]" had no medications administered during this visit.  See the medical record for exact dosing, route, and time of administration.  Follow-up plan:   No follow-ups on file.      Status post bilateral L3, L4,  L5, S1 facet  medial branch nerve blocks #1 on 06/01/2019 50% pain relief for 3 days, consider repeating in future or consider sprint peripheral nerve stimulation of medial branch at L4        Recent Visits Date Type Provider Dept  09/08/20 Office Visit Edward Jolly, MD [facility]  Showing recent visits within past 90 days and meeting all other requirements Today's Visits Date Type Provider Dept  09/14/20 Procedure visit Edward Jolly, MD [facility]  Showing today's visits and meeting all other requirements Future Appointments Date Type Provider Dept  12/08/20 Appointment Edward Jolly, MD [facility]  Showing future appointments within next 90 days and meeting all other requirements Disposition: Discharge home  Discharge (Date  Time): 09/14/2020;   hrs.   Primary Care Physician: Tiana Loft, MD Location: [facility] Note by: Edward Jolly, MD Date: 09/14/2020; Time: 9:51 AM  Disclaimer:  Medicine is not an exact science. The only guarantee in medicine is that nothing is guaranteed. It is important to note that the decision to proceed with this intervention was based on the information collected from the patient. The Data and conclusions were drawn from the patient's questionnaire, the interview, and the physical examination. Because the information was provided in large part by the patient, it cannot be guaranteed that it has not been purposely or unconsciously manipulated. Every effort has been made to obtain as much relevant data as possible for this evaluation. It is important to note that the conclusions that lead to this procedure are  derived in large part from the available data. Always take into account that the treatment will also be dependent on availability of resources and existing treatment guidelines, considered by other Pain Management Practitioners as being common knowledge and practice, at the time of the intervention. For Medico-Legal purposes, it is also important to point out that variation in procedural techniques and pharmacological choices are the acceptable norm. The indications, contraindications, technique, and results of the above procedure should only be interpreted and judged by a Board-Certified Interventional Pain Specialist with extensive familiarity and expertise in the same exact procedure and technique.

## 2020-09-14 NOTE — Patient Instructions (Signed)

## 2020-09-14 NOTE — Progress Notes (Signed)
PROVIDER NOTE: Information contained herein reflects review and annotations entered in association with encounter. Interpretation of such information and data should be left to medically-trained personnel. Information provided to patient can be located elsewhere in the medical record under "Patient Instructions". Document created using STT-dictation technology, any transcriptional errors that may result from process are unintentional.    Patient: Charles Mooney  Service Category: Procedure  Provider: Edward Jolly, MD  DOB: 1955/09/02  DOS: 09/14/2020  Location: [facility]  MRN: [id number]  Setting: Ambulatory - outpatient  Referring Provider: Tiana Loft, MD  Type: Established Patient  Specialty: Interventional Pain Management  PCP: Tiana Loft, MD   Primary Reason for Visit: Interventional Pain Management Treatment. CC: Shoulder Pain (right), Ankle Pain (bilateral), Back Pain (low), and Wrist Pain (left)  Procedure:          Anesthesia, Analgesia, Anxiolysis:  Type: Diagnostic Glenohumeral Joint (shoulder) Injection          Primary Purpose: Diagnostic Region: Superior Shoulder Area Level:  Shoulder Target Area: Glenohumeral Joint (shoulder) Approach: Anterior approach. Laterality: Right-Sided  Type: Local Anesthesia  Local Anesthetic: Lidocaine 1-2%  Position: Supine   Indications: 1. Arthritis of right shoulder region   2. Arthritis of right glenohumeral joint   3. Chronic pain syndrome    Pain Score: Pre-procedure: 6 /10 Post-procedure: 3 /10   Pre-op H&P Assessment:  Charles Mooney is a 65 y.o. (year old), male patient, seen today for interventional treatment. He  has a past surgical history that includes Brain surgery; Joint replacement (Bilateral); and Shoulder arthroscopy with rotator cuff repair and open biceps tenodesis (Left, 09/08/2018). Charles Mooney has a current medication list which includes the following prescription(s): amlodipine, b complex  vitamins, cholecalciferol, fexofenadine, fluticasone, hydrocodone-acetaminophen, [START ON 10/11/2020] hydrocodone-acetaminophen, [START ON 11/10/2020] hydrocodone-acetaminophen, levocetirizine, meloxicam, montelukast, NON FORMULARY, tizanidine, turmeric, and hydrocodone-acetaminophen. His primarily concern today is the Shoulder Pain (right), Ankle Pain (bilateral), Back Pain (low), and Wrist Pain (left)  Initial Vital Signs:  Pulse/HCG Rate: 73ECG Heart Rate: 75 Temp: (!) 97.2 F (36.2 C) Resp: 16 BP: (!) 149/103 SpO2: 99 %  BMI: Estimated body mass index is 29.84 kg/m as calculated from the following:   Height as of this encounter: 6' (1.829 m).   Weight as of this encounter: 220 lb (99.8 kg).  Risk Assessment: Allergies: Reviewed. He is allergic to sulfa antibiotics.  Allergy Precautions: None required Coagulopathies: Reviewed. None identified.  Blood-thinner therapy: None at this time Active Infection(s): Reviewed. None identified. Charles Mooney is afebrile  Site Confirmation: Charles Mooney was asked to confirm the procedure and laterality before marking the site Procedure checklist: Completed Consent: Before the procedure and under the influence of no sedative(s), amnesic(s), or anxiolytics, the patient was informed of the treatment options, risks and possible complications. To fulfill our ethical and legal obligations, as recommended by the American Medical Association's Code of Ethics, I have informed the patient of my clinical impression; the nature and purpose of the treatment or procedure; the risks, benefits, and possible complications of the intervention; the alternatives, including doing nothing; the risk(s) and benefit(s) of the alternative treatment(s) or procedure(s); and the risk(s) and benefit(s) of doing nothing. The patient was provided information about the general risks and possible complications associated with the procedure. These may include, but are not limited to:  failure to achieve desired goals, infection, bleeding, organ or nerve damage, allergic reactions, paralysis, and death. In addition, the patient was informed of those risks and complications associated to  the procedure, such as failure to decrease pain; infection; bleeding; organ or nerve damage with subsequent damage to sensory, motor, and/or autonomic systems, resulting in permanent pain, numbness, and/or weakness of one or several areas of the body; allergic reactions; (i.e.: anaphylactic reaction); and/or death. Furthermore, the patient was informed of those risks and complications associated with the medications. These include, but are not limited to: allergic reactions (i.e.: anaphylactic or anaphylactoid reaction(s)); adrenal axis suppression; blood sugar elevation that in diabetics may result in ketoacidosis or comma; water retention that in patients with history of congestive heart failure may result in shortness of breath, pulmonary edema, and decompensation with resultant heart failure; weight gain; swelling or edema; medication-induced neural toxicity; particulate matter embolism and blood vessel occlusion with resultant organ, and/or nervous system infarction; and/or aseptic necrosis of one or more joints. Finally, the patient was informed that Medicine is not an exact science; therefore, there is also the possibility of unforeseen or unpredictable risks and/or possible complications that may result in a catastrophic outcome. The patient indicated having understood very clearly. We have given the patient no guarantees and we have made no promises. Enough time was given to the patient to ask questions, all of which were answered to the patient's satisfaction. Charles Mooney has indicated that he wanted to continue with the procedure. Attestation: I, the ordering provider, attest that I have discussed with the patient the benefits, risks, side-effects, alternatives, likelihood of achieving goals, and  potential problems during recovery for the procedure that I have provided informed consent. Date  Time: 09/14/2020  9:41 AM  Pre-Procedure Preparation:  Monitoring: As per clinic protocol. Respiration, ETCO2, SpO2, BP, heart rate and rhythm monitor placed and checked for adequate function Safety Precautions: Patient was assessed for positional comfort and pressure points before starting the procedure. Time-out: I initiated and conducted the "Time-out" before starting the procedure, as per protocol. The patient was asked to participate by confirming the accuracy of the "Time Out" information. Verification of the correct person, site, and procedure were performed and confirmed by me, the nursing staff, and the patient. "Time-out" conducted as per Joint Commission's Universal Protocol (UP.01.01.01). Time: 1008  Description of Procedure:          Area Prepped: Entire shoulder Area DuraPrep (Iodine Povacrylex [0.7% available iodine] and Isopropyl Alcohol, 74% w/w) Safety Precautions: Aspiration looking for blood return was conducted prior to all injections. At no point did we inject any substances, as a needle was being advanced. No attempts were made at seeking any paresthesias. Safe injection practices and needle disposal techniques used. Medications properly checked for expiration dates. SDV (single dose vial) medications used. Description of the Procedure: Protocol guidelines were followed. The patient was placed in position over the procedure table. The target area was identified and the area prepped in the usual manner. Skin & deeper tissues infiltrated with local anesthetic. Appropriate amount of time allowed to pass for local anesthetics to take effect. The procedure needles were then advanced to the target area. Proper needle placement secured. Negative aspiration confirmed. Solution injected in intermittent fashion, asking for systemic symptoms every 0.5cc of injectate. The needles were then  removed and the area cleansed, making sure to leave some of the prepping solution back to take advantage of its long term bactericidal properties.         Vitals:   09/14/20 0943 09/14/20 0945 09/14/20 1006 09/14/20 1011  BP: (!) 149/103 (!) 148/89 (!) 142/96 (!) 136/101  Pulse: 73  Resp: 16  15 20   Temp: (!) 97.2 F (36.2 C)     SpO2: 99%  95% 96%  Weight: 220 lb (99.8 kg)     Height: 6' (1.829 m)       Start Time: 1008 hrs. End Time: 1010 hrs. Materials:  Needle(s) Type: Spinal Needle Gauge: 22G Length: 3.5-in Medication(s): Please see orders for medications and dosing details. 5cc solution made of 4 cc of 0.2% ropivacaine, 1 cc of methylprednisolone, 40 mg/cc.  Imaging Guidance (Non-Spinal):          Type of Imaging Technique: Fluoroscopy Guidance (Non-Spinal) Indication(s): Assistance in needle guidance and placement for procedures requiring needle placement in or near specific anatomical locations not easily accessible without such assistance. Exposure Time: Please see nurses notes. Contrast: Before injecting any contrast, we confirmed that the patient did not have an allergy to iodine, shellfish, or radiological contrast. Once satisfactory needle placement was completed at the desired level, radiological contrast was injected. Contrast injected under live fluoroscopy. No contrast complications. See chart for type and volume of contrast used. Fluoroscopic Guidance: I was personally present during the use of fluoroscopy. "Tunnel Vision Technique" used to obtain the best possible view of the target area. Parallax error corrected before commencing the procedure. "Direction-depth-direction" technique used to introduce the needle under continuous pulsed fluoroscopy. Once target was reached, antero-posterior, oblique, and lateral fluoroscopic projection used confirm needle placement in all planes. Images permanently stored in EMR. Interpretation: I personally interpreted the imaging  intraoperatively. Adequate needle placement confirmed in multiple planes. Appropriate spread of contrast into desired area was observed. No evidence of afferent or efferent intravascular uptake. Permanent images saved into the patient's record.  Post-operative Assessment:  Post-procedure Vital Signs:  Pulse/HCG Rate: 7366 Temp: (!) 97.2 F (36.2 C) Resp: 20 BP: (!) 136/101 SpO2: 96 %  EBL: None  Complications: No immediate post-treatment complications observed by team, or reported by patient.  Note: The patient tolerated the entire procedure well. A repeat set of vitals were taken after the procedure and the patient was kept under observation following institutional policy, for this type of procedure. Post-procedural neurological assessment was performed, showing return to baseline, prior to discharge. The patient was provided with post-procedure discharge instructions, including a section on how to identify potential problems. Should any problems arise concerning this procedure, the patient was given instructions to immediately contact , at any time, without hesitation. In any case, we plan to contact the patient by telephone for a follow-up status report regarding this interventional procedure.  Comments:  No additional relevant information.  Plan of Care  Orders:  Orders Placed This Encounter  Procedures   DG PAIN CLINIC C-ARM 1-60 MIN NO REPORT    Intraoperative interpretation by procedural physician at [facility].    Standing Status:   Standing    Number of Occurrences:   1    Order Specific Question:   Reason for exam:    Answer:   Assistance in needle guidance and placement for procedures requiring needle placement in or near specific anatomical locations not easily accessible without such assistance.   Chronic Opioid Analgesic:  Hydrocodone 10 mg BID prn   Medications ordered for procedure: Meds ordered this encounter  Medications   iohexol (OMNIPAQUE) 180  MG/ML injection 10 mL    Must be Myelogram-compatible. If not available, you may substitute with a water-soluble, non-ionic, hypoallergenic, myelogram-compatible radiological contrast medium.   methylPREDNISolone acetate (DEPO-MEDROL) injection 40 mg   ropivacaine (PF) 2  mg/mL (0.2%) (NAROPIN) injection 4 mL   Medications administered: We administered iohexol, methylPREDNISolone acetate, and ropivacaine (PF) 2 mg/mL (0.2%).  See the medical record for exact dosing, route, and time of administration.  Follow-up plan:   Return in about 4 weeks (around 10/12/2020) for Post Procedure Evaluation, virtual.      Status post bilateral L3, L4, L5, S1 facet  medial branch nerve blocks #1 on 06/01/2019 50% pain relief for 3 days, consider repeating in future or consider sprint peripheral nerve stimulation of medial branch at L4        Recent Visits Date Type Provider Dept  09/08/20 Office Visit Edward Jolly, MD [facility]  Showing recent visits within past 90 days and meeting all other requirements Today's Visits Date Type Provider Dept  09/14/20 Procedure visit Edward Jolly, MD [facility]  Showing today's visits and meeting all other requirements Future Appointments Date Type Provider Dept  10/12/20 Appointment Edward Jolly, MD [facility]  12/08/20 Appointment Edward Jolly, MD [facility]  Showing future appointments within next 90 days and meeting all other requirements Disposition: Discharge home  Discharge (Date  Time): 09/14/2020; 1020 hrs.   Primary Care Physician: Tiana Loft, MD Location: [facility] Note by: Edward Jolly, MD Date: 09/14/2020; Time: 10:16 AM  Disclaimer:  Medicine is not an exact science. The only guarantee in medicine is that nothing is guaranteed. It is important to note that the decision to proceed with this intervention was based on the information collected from the patient. The Data  and conclusions were drawn from the patient's questionnaire, the interview, and the physical examination. Because the information was provided in large part by the patient, it cannot be guaranteed that it has not been purposely or unconsciously manipulated. Every effort has been made to obtain as much relevant data as possible for this evaluation. It is important to note that the conclusions that lead to this procedure are derived in large part from the available data. Always take into account that the treatment will also be dependent on availability of resources and existing treatment guidelines, considered by other Pain Management Practitioners as being common knowledge and practice, at the time of the intervention. For Medico-Legal purposes, it is also important to point out that variation in procedural techniques and pharmacological choices are the acceptable norm. The indications, contraindications, technique, and results of the above procedure should only be interpreted and judged by a Board-Certified Interventional Pain Specialist with extensive familiarity and expertise in the same exact procedure and technique.

## 2020-09-15 ENCOUNTER — Telehealth: Payer: Self-pay | Admitting: *Deleted

## 2020-09-15 NOTE — Telephone Encounter (Signed)
Called for post procedure check. No answer. LVM. 

## 2020-10-11 ENCOUNTER — Encounter: Payer: Self-pay | Admitting: Student in an Organized Health Care Education/Training Program

## 2020-10-12 ENCOUNTER — Ambulatory Visit
Payer: Medicare Other | Attending: Student in an Organized Health Care Education/Training Program | Admitting: Student in an Organized Health Care Education/Training Program

## 2020-10-12 ENCOUNTER — Other Ambulatory Visit: Payer: Self-pay

## 2020-10-12 DIAGNOSIS — M19032 Primary osteoarthritis, left wrist: Secondary | ICD-10-CM | POA: Insufficient documentation

## 2020-10-12 DIAGNOSIS — M19011 Primary osteoarthritis, right shoulder: Secondary | ICD-10-CM

## 2020-10-12 NOTE — Progress Notes (Signed)
Patient: Charles Mooney  Service Category: E/M  Provider: Gillis Santa, MD  DOB: 19-May-1955  DOS: 10/12/2020  Location: Office  MRN: [id number]  Setting: Ambulatory outpatient  Referring Provider: Gale Journey, MD  Type: Established Patient  Specialty: Interventional Pain Management  PCP: Gale Journey, MD  Location: Home  Delivery: TeleHealth     Virtual Encounter - Pain Management PROVIDER NOTE: Information contained herein reflects review and annotations entered in association with encounter. Interpretation of such information and data should be left to medically-trained personnel. Information provided to patient can be located elsewhere in the medical record under "Patient Instructions". Document created using STT-dictation technology, any transcriptional errors that may result from process are unintentional.    Contact & Pharmacy Preferred: [phone] Home: [phone] (home) Mobile: [phone] (mobile) E-mail: [email or web address]  [address], [place] [place] [address] Phone: [phone] Fax: [phone]   Pre-screening  Charles Mooney offered "in-person" vs "virtual" encounter. He indicated preferring virtual for this encounter.   Reason COVID-19*  Social distancing based on CDC and AMA recommendations.   I contacted Charles Mooney on 10/12/2020 via video conference.      I clearly identified myself as Gillis Santa, MD. I verified that I was speaking with the correct person using two identifiers (Name: Charles Mooney, and date of birth: 01/04/1956).  Consent I sought verbal advanced consent from Charles Mooney for virtual visit interactions. I informed Charles Mooney of possible security and privacy concerns, risks, and limitations associated with providing "not-in-person" medical evaluation and management services. I also informed Charles Mooney of the availability of "in-person" appointments. Finally, I informed him that there  would be a charge for the virtual visit and that he could be  personally, fully or partially, financially responsible for it. Charles Mooney expressed understanding and agreed to proceed.   Historic Elements   Charles Mooney is a 64 y.o. year old, male patient evaluated today after our last contact on 09/14/2020. Charles Mooney  has a past medical history of Hepatitis. He also  has a past surgical history that includes Brain surgery; Joint replacement (Bilateral); and Shoulder arthroscopy with rotator cuff repair and open biceps tenodesis (Left, 09/08/2018). Mr. Nabers has a current medication list which includes the following prescription(s): amlodipine, b complex vitamins, cholecalciferol, fexofenadine, fluticasone, hydrocodone-acetaminophen, hydrocodone-acetaminophen, [START ON 11/10/2020] hydrocodone-acetaminophen, levocetirizine, meloxicam, montelukast, NON FORMULARY, tizanidine, turmeric, and hydrocodone-acetaminophen. He  reports that he quit smoking about 4 years ago. His smoking use included cigarettes. He has never used smokeless tobacco. He reports current alcohol use. He reports that he does not currently use drugs. Charles Mooney is allergic to sulfa antibiotics.   HPI  Today, he is being contacted for a post-procedure assessment.   Post-Procedure Evaluation  Procedure (09/14/2020):   Type: Diagnostic Glenohumeral Joint (shoulder) Injection          Primary Purpose: Diagnostic Region: Superior Shoulder Area Level:  Shoulder Target Area: Glenohumeral Joint (shoulder) Approach: Anterior approach. Laterality: Right-Sided  Anxiolysis: Please see nurses note.  Effectiveness during initial hour after procedure (Ultra-Short Term Relief): 50 %   Local anesthetic used: Long-acting (4-6 hours) Effectiveness: Defined as any analgesic benefit obtained secondary to the administration of local anesthetics. This carries significant diagnostic value as to the etiological location, or anatomical origin,  of the pain. Duration of benefit is expected to coincide with the duration of the local anesthetic used.  Effectiveness during initial 4-6 hours after procedure (Short-Term  Relief): 50 %   Long-term benefit: Defined as any relief past the pharmacologic duration of the local anesthetics.  Effectiveness past the initial 6 hours after procedure (Long-Term Relief): 50%  Benefits, current: Defined as benefit present at the time of this evaluation.   Analgesia:  75%  Function: Somewhat improved ROM: Somewhat improved   Laboratory Chemistry Profile   Renal Lab Results  Component Value Date   BUN 18 09/04/2018   CREATININE 0.81 09/04/2018   GFRAA >60 09/04/2018   GFRNONAA >60 09/04/2018    Hepatic Lab Results  Component Value Date   AST 18 09/04/2018   ALT 20 09/04/2018   ALBUMIN 4.4 09/04/2018   ALKPHOS 39 09/04/2018    Electrolytes Lab Results  Component Value Date   NA 138 09/04/2018   K 3.9 09/04/2018   CL 105 09/04/2018   CALCIUM 9.2 09/04/2018    Bone No results found for: VD25OH, VD125OH2TOT, MW4132GM0, NU2725DG6, 25OHVITD1, 25OHVITD2, 25OHVITD3, TESTOFREE, TESTOSTERONE  Inflammation (CRP: Acute Phase) (ESR: Chronic Phase) No results found for: CRP, ESRSEDRATE, LATICACIDVEN       Note: Above Lab results reviewed.   Assessment  The primary encounter diagnosis was Arthritis of right shoulder region. Diagnoses of Arthritis of right glenohumeral joint and Arthropathy of left wrist were also pertinent to this visit.  Plan of Care   1. Arthritis of right shoulder region -Status post right anterior glenohumeral joint steroid injection under fluoroscopy on 09/14/2020.  Patient endorses 60 to 75% pain relief overall.  He states that he is more active and functional.  He states that the pain relief that he has experienced with this current injection is superior to his previous 2 shoulder injections that he has had in the past.  Can repeat in future.  2. Arthritis of right  glenohumeral joint -As above  3. Arthropathy of left wrist -Patient states that he is having increased left wrist pain specifically with wrist flexion.  He does have arthropathy of his left wrist.  He would like to a left wrist steroid injection.  Risks and benefits reviewed.  We will plan on doing this under fluoroscopy without sedation. - Medium Joint Injection/Arthrocentesis; Future  4.  Chronic pain syndrome: Continue multimodal analgesics including hydrocodone, Mobic, tizanidine, turmeric.    Orders:  Orders Placed This Encounter  Procedures   Medium Joint Injection/Arthrocentesis    Standing Status:   Future    Standing Expiration Date:   10/12/2021    Scheduling Instructions:     Left wrist steroid injection under fluoroscopy, without sedation    Follow-up plan:   Return in about 2 weeks (around 10/26/2020) for L wrist steroid injection , without sedation.     Status post bilateral L3, L4, L5, S1 facet  medial branch nerve blocks #1 on 06/01/2019 50% pain relief for 3 days, consider repeating in future or consider sprint peripheral nerve stimulation of medial branch at L4         Recent Visits Date Type Provider Dept  09/14/20 Procedure visit Gillis Santa, MD [facility]  09/08/20 Office Visit Gillis Santa, MD [facility]  Showing recent visits within past 90 days and meeting all other requirements Today's Visits Date Type Provider Dept  10/12/20 Telemedicine Gillis Santa, MD [facility]  Showing today's visits and meeting all other requirements Future Appointments Date Type Provider Dept  12/08/20 Appointment Gillis Santa, MD [facility]  Showing future appointments within next 90 days and meeting all other requirements I  discussed the assessment and treatment plan with the patient. The patient was provided an opportunity to ask questions and all were answered. The patient agreed with the plan and demonstrated an understanding of  the instructions.  Patient advised to call back or seek an in-person evaluation if the symptoms or condition worsens.  Duration of encounter: 30 minutes.  Note by: Gillis Santa, MD Date: 10/12/2020; Time: 11:36 AM

## 2020-10-13 NOTE — Patient Instructions (Signed)

## 2020-10-26 ENCOUNTER — Ambulatory Visit
Admission: RE | Admit: 2020-10-26 | Discharge: 2020-10-26 | Disposition: A | Discharge status: Home / Self Care | Payer: Medicare Other | Source: Ambulatory Visit | Attending: Student in an Organized Health Care Education/Training Program | Admitting: Student in an Organized Health Care Education/Training Program

## 2020-10-26 ENCOUNTER — Ambulatory Visit (HOSPITAL_BASED_OUTPATIENT_CLINIC_OR_DEPARTMENT_OTHER): Payer: Medicare Other | Admitting: Student in an Organized Health Care Education/Training Program

## 2020-10-26 ENCOUNTER — Other Ambulatory Visit: Payer: Self-pay

## 2020-10-26 VITALS — BP 146/98 | HR 65 | Resp 18

## 2020-10-26 DIAGNOSIS — M19032 Primary osteoarthritis, left wrist: Secondary | ICD-10-CM

## 2020-10-26 DIAGNOSIS — G894 Chronic pain syndrome: Secondary | ICD-10-CM | POA: Diagnosis present

## 2020-10-26 MED ORDER — METHYLPREDNISOLONE ACETATE 40 MG/ML IJ SUSP
40.0000 mg | Freq: Once | INTRAMUSCULAR | Status: AC
  Administered 2020-10-26: 40 mg via INTRA_ARTICULAR
  Filled 2020-10-26: qty 1

## 2020-10-26 MED ORDER — ROPIVACAINE HCL 2 MG/ML IJ SOLN
INTRAMUSCULAR | Status: AC
  Filled 2020-10-26: qty 20

## 2020-10-26 MED ORDER — LIDOCAINE HCL 2 % IJ SOLN
20.0000 mL | Freq: Once | INTRAMUSCULAR | Status: AC
  Administered 2020-10-26: 200 mg

## 2020-10-26 MED ORDER — LIDOCAINE HCL (PF) 2 % IJ SOLN
INTRAMUSCULAR | Status: AC
  Filled 2020-10-26: qty 10

## 2020-10-26 NOTE — Progress Notes (Signed)
PROVIDER NOTE: Information contained herein reflects review and annotations entered in association with encounter. Interpretation of such information and data should be left to medically-trained personnel. Information provided to patient can be located elsewhere in the medical record under "Patient Instructions". Document created using STT-dictation technology, any transcriptional errors that may result from process are unintentional.    Patient: Charles Mooney  Service Category: Procedure  Provider: Edward Jolly, MD  DOB: [birth year]  DOS: 10/26/2020  Location: [facility]  MRN: [id number]  Setting: Ambulatory - outpatient  Referring Provider: Tiana Loft, MD  Type: Established Patient  Specialty: Interventional Pain Management  PCP: Tiana Loft, MD   Primary Reason for Visit: Interventional Pain Management Treatment. CC: Wrist Pain (left)    Procedure:          Anesthesia, Analgesia, Anxiolysis:  Type: Diagnostic Wrist Steroid Injection           Region: Dorsal Interspace between scaphoid and radial head Level: Wrist Laterality: Left  Type: Local Anesthesia Local Anesthetic: Lidocaine 1-2% Sedation: None  Indication(s):  Analgesia Route: Infiltration ([place]/IM) IV Access: N/A   Position: Sitting   Indications: 1. Arthropathy of left wrist   2. Chronic pain syndrome    Pain Score: Pre-procedure: 6 /10 Post-procedure: 4  (moving hand/wrist)/10     Pre-op H&P Assessment:  Charles Mooney is a 65 y.o. (year old), male patient, seen today for interventional treatment. He  has a past surgical history that includes Brain surgery; Joint replacement (Bilateral); and Shoulder arthroscopy with rotator cuff repair and open biceps tenodesis (Left, 09/08/2018). Charles Mooney has a current medication list which includes the following prescription(s): amlodipine, b complex vitamins, cholecalciferol, fexofenadine, fluticasone, hydrocodone-acetaminophen, [START ON 11/10/2020]  hydrocodone-acetaminophen, levocetirizine, meloxicam, montelukast, NON FORMULARY, tizanidine, turmeric, hydrocodone-acetaminophen, and hydrocodone-acetaminophen. His primarily concern today is the Wrist Pain (left)  Initial Vital Signs:  Pulse/HCG Rate: 65 (nsr)  Temp:   Resp: 18 BP: (!) 146/98 SpO2:    BMI: Estimated body mass index is 29.84 kg/m as calculated from the following:   Height as of 09/14/20: 6' (1.829 m).   Weight as of 09/14/20: 220 lb (99.8 kg).  Risk Assessment: Allergies: Reviewed. He is allergic to sulfa antibiotics.  Allergy Precautions: None required Coagulopathies: Reviewed. None identified.  Blood-thinner therapy: None at this time Active Infection(s): Reviewed. None identified. Charles Mooney is afebrile  Site Confirmation: Charles Mooney was asked to confirm the procedure and laterality before marking the site Procedure checklist: Completed Consent: Before the procedure and under the influence of no sedative(s), amnesic(s), or anxiolytics, the patient was informed of the treatment options, risks and possible complications. To fulfill our ethical and legal obligations, as recommended by the American Medical Association's Code of Ethics, I have informed the patient of my clinical impression; the nature and purpose of the treatment or procedure; the risks, benefits, and possible complications of the intervention; the alternatives, including doing nothing; the risk(s) and benefit(s) of the alternative treatment(s) or procedure(s); and the risk(s) and benefit(s) of doing nothing. The patient was provided information about the general risks and possible complications associated with the procedure. These may include, but are not limited to: failure to achieve desired goals, infection, bleeding, organ or nerve damage, allergic reactions, paralysis, and death. In addition, the patient was informed of those risks and complications associated to the procedure, such as failure to  decrease pain; infection; bleeding; organ or nerve damage with subsequent damage to sensory, motor, and/or autonomic systems, resulting in permanent pain, numbness,  and/or weakness of one or several areas of the body; allergic reactions; (i.e.: anaphylactic reaction); and/or death. Furthermore, the patient was informed of those risks and complications associated with the medications. These include, but are not limited to: allergic reactions (i.e.: anaphylactic or anaphylactoid reaction(s)); adrenal axis suppression; blood sugar elevation that in diabetics may result in ketoacidosis or comma; water retention that in patients with history of congestive heart failure may result in shortness of breath, pulmonary edema, and decompensation with resultant heart failure; weight gain; swelling or edema; medication-induced neural toxicity; particulate matter embolism and blood vessel occlusion with resultant organ, and/or nervous system infarction; and/or aseptic necrosis of one or more joints. Finally, the patient was informed that Medicine is not an exact science; therefore, there is also the possibility of unforeseen or unpredictable risks and/or possible complications that may result in a catastrophic outcome. The patient indicated having understood very clearly. We have given the patient no guarantees and we have made no promises. Enough time was given to the patient to ask questions, all of which were answered to the patient's satisfaction. Charles Mooney has indicated that he wanted to continue with the procedure. Attestation: I, the ordering provider, attest that I have discussed with the patient the benefits, risks, side-effects, alternatives, likelihood of achieving goals, and potential problems during recovery for the procedure that I have provided informed consent. Date  Time: 10/26/2020 12:40 PM  Pre-Procedure Preparation:  Monitoring: As per clinic protocol. Respiration, ETCO2, SpO2, BP, heart rate and rhythm  monitor placed and checked for adequate function Safety Precautions: Patient was assessed for positional comfort and pressure points before starting the procedure. Time-out: I initiated and conducted the "Time-out" before starting the procedure, as per protocol. The patient was asked to participate by confirming the accuracy of the "Time Out" information. Verification of the correct person, site, and procedure were performed and confirmed by me, the nursing staff, and the patient. "Time-out" conducted as per Joint Commission's Universal Protocol (UP.01.01.01). Time: 1319  Description of Procedure:          Target Area: Space between scaphoid and radial head Approach: Dorsal approach. Area Prepped: Entire wrist Region DuraPrep (Iodine Povacrylex [0.7% available iodine] and Isopropyl Alcohol, 74% w/w) Safety Precautions: Aspiration looking for blood return was conducted prior to all injections. At no point did we inject any substances, as a needle was being advanced. No attempts were made at seeking any paresthesias. Safe injection practices and needle disposal techniques used. Medications properly checked for expiration dates. SDV (single dose vial) medications used. Description of the Procedure: Protocol guidelines were followed. The patient was placed in position. The target area was identified and the area prepped in the usual manner. Skin & deeper tissues infiltrated with local anesthetic. Appropriate amount of time allowed to pass for local anesthetics to take effect. The procedure needles were then advanced to the target area. Proper needle placement secured. Negative aspiration confirmed. Solution injected in intermittent fashion, asking for systemic symptoms every 0.5cc of injectate. The needles were then removed and the area cleansed, making sure to leave some of the prepping solution back to take advantage of its long term bactericidal properties.  Vitals:   10/26/20 1324  BP: (!) 146/98   Pulse: 65  Resp: 18    Start Time: 1319 hrs. End Time: 1322 hrs. Materials:  Needle(s) Type: Regular needle Gauge: 25G Length: 1.5-in Medication(s): Please see orders for medications and dosing details. 4 cc solution made of 3 cc of 2% lidocaine, 1  cc of methylprednisolone, 40 mg/cc.  Injected into the left wrist between the scaphoid and radius under fluoroscopy. Imaging Guidance:          Type of Imaging Technique: Fluoroscopy Guidance (Non-spinal) Indication(s): Assistance in needle guidance and placement for procedures requiring needle placement in or near specific anatomical locations not easily accessible without such assistance. Exposure Time: Please see nurses notes. Contrast: None used. Fluoroscopic Guidance: I was personally present during the use of fluoroscopy. "Tunnel Vision Technique" used to obtain the best possible view of the target area. Parallax error corrected before commencing the procedure. "Direction-depth-direction" technique used to introduce the needle under continuous pulsed fluoroscopy. Once target was reached, antero-posterior, oblique, and lateral fluoroscopic projection used confirm needle placement in all planes. Images permanently stored in EMR. Ultrasound Guidance: N/A Interpretation: N/A  Post-operative Assessment:  Post-procedure Vital Signs:  Pulse/HCG Rate: 65 (nsr)  Temp:   Resp: 18 BP: (!) 146/98 SpO2:    EBL: None  Complications: No immediate post-treatment complications observed by team, or reported by patient.  Note: The patient tolerated the entire procedure well. A repeat set of vitals were taken after the procedure and the patient was kept under observation following institutional policy, for this type of procedure. Post-procedural neurological assessment was performed, showing return to baseline, prior to discharge. The patient was provided with post-procedure discharge instructions, including a section on how to identify potential  problems. Should any problems arise concerning this procedure, the patient was given instructions to immediately contact us, at any time, without hesitation. In any case, we plan to contact the patient by telephone for a follow-up status report regarding this interventional procedure.  Comments:  No additional relevant information.  Plan of Care  Orders:  Orders Placed This Encounter  Procedures   DG PAIN CLIN[facility] C-ARM 1-60 MIN NO REPORT    Intraoperative interpretation by procedural physician at [facility].    Standing Status:   Standing    Number of Occurrences:   1    Order Specific Question:   Reason for exam:    Answer:   Assistance in needle guidance and placement for procedures requiring needle placement in or near specific anatomical locations not easily accessible without such assistance.   Chronic Opioid Analgesic:  Hydrocodone 10 mg BID prn   Medications ordered for procedure: Meds ordered this encounter  Medications   lidocaine (XYLOCAINE) 2 % (with pres) injection 400 mg   methylPREDNISolone acetate (DEPO-MEDROL) injection 40 mg   Medications administered: We administered lidocaine and methylPREDNISolone acetate.  See the medical record for exact dosing, route, and time of administration.  Follow-up plan:   Return for Keep sch. appt.       Status post bilateral L3, L4, L5, S1 facet  medial branch nerve blocks #1 on 06/01/2019 50% pain relief for 3 days, consider repeating in future or consider sprint peripheral nerve stimulation of medial branch at L4.  Status post left wrist steroid injection on 09/07/2020.         Recent Visits Date Type Provider Dept  10/12/20 Telemedicine Edward JollyLateef, Toniqua Melamed, MD [facility]  09/14/20 Procedure visit Edward JollyLateef, Janit Cutter, MD [facility]  09/08/20 Office Visit Edward JollyLateef, Kienan Doublin, MD [facility]  Showing recent visits within past 90 days and meeting all other requirements Today's Visits Date Type Provider Dept   10/26/20 Procedure visit Edward JollyLateef, Kyo Cocuzza, MD [facility]  Showing today's visits and meeting all other requirements Future Appointments Date Type Provider Dept  12/08/20  Appointment Edward Jolly, MD [facility]  Showing future appointments within next 90 days and meeting all other requirements Disposition: Discharge home  Discharge (Date  Time): 10/26/2020; 1327 hrs.   Primary Care Physician: Tiana Loft, MD Location: [facility] Note by: Edward Jolly, MD Date: 10/26/2020; Time: 2:43 PM  Disclaimer:  Medicine is not an exact science. The only guarantee in medicine is that nothing is guaranteed. It is important to note that the decision to proceed with this intervention was based on the information collected from the patient. The Data and conclusions were drawn from the patient's questionnaire, the interview, and the physical examination. Because the information was provided in large part by the patient, it cannot be guaranteed that it has not been purposely or unconsciously manipulated. Every effort has been made to obtain as much relevant data as possible for this evaluation. It is important to note that the conclusions that lead to this procedure are derived in large part from the available data. Always take into account that the treatment will also be dependent on availability of resources and existing treatment guidelines, considered by other Pain Management Practitioners as being common knowledge and practice, at the time of the intervention. For Medico-Legal purposes, it is also important to point out that variation in procedural techniques and pharmacological choices are the acceptable norm. The indications, contraindications, technique, and results of the above procedure should only be interpreted and judged by a Board-Certified Interventional Pain Specialist with extensive familiarity and expertise in the same exact procedure and technique.

## 2020-10-26 NOTE — Patient Instructions (Signed)

## 2020-10-26 NOTE — Progress Notes (Signed)
Safety precautions to be maintained throughout the outpatient stay will include: orient to surroundings, keep bed in low position, maintain call bell within reach at all times, provide assistance with transfer out of bed and ambulation.  

## 2020-10-27 ENCOUNTER — Telehealth: Payer: Self-pay

## 2020-10-27 NOTE — Telephone Encounter (Signed)
Post procedure phone call. Patient states he is doing well.  

## 2020-12-08 ENCOUNTER — Ambulatory Visit
Payer: Medicare Other | Attending: Student in an Organized Health Care Education/Training Program | Admitting: Student in an Organized Health Care Education/Training Program

## 2020-12-08 ENCOUNTER — Encounter: Payer: Self-pay | Admitting: Student in an Organized Health Care Education/Training Program

## 2020-12-08 ENCOUNTER — Other Ambulatory Visit: Payer: Self-pay

## 2020-12-08 VITALS — BP 151/89 | Temp 96.9°F | Resp 15 | Ht 72.0 in | Wt 220.0 lb

## 2020-12-08 DIAGNOSIS — M47816 Spondylosis without myelopathy or radiculopathy, lumbar region: Secondary | ICD-10-CM

## 2020-12-08 DIAGNOSIS — M19011 Primary osteoarthritis, right shoulder: Secondary | ICD-10-CM | POA: Diagnosis present

## 2020-12-08 DIAGNOSIS — G894 Chronic pain syndrome: Secondary | ICD-10-CM | POA: Diagnosis present

## 2020-12-08 DIAGNOSIS — M5136 Other intervertebral disc degeneration, lumbar region: Secondary | ICD-10-CM | POA: Diagnosis present

## 2020-12-08 DIAGNOSIS — M19032 Primary osteoarthritis, left wrist: Secondary | ICD-10-CM

## 2020-12-08 DIAGNOSIS — M51369 Other intervertebral disc degeneration, lumbar region without mention of lumbar back pain or lower extremity pain: Secondary | ICD-10-CM

## 2020-12-08 MED ORDER — TIZANIDINE HCL 4 MG PO TABS: 6.0000 mg | ORAL_TABLET | Freq: Two times a day (BID) | ORAL | 5 refills | Status: DC | PRN

## 2020-12-08 MED ORDER — HYDROCODONE-ACETAMINOPHEN 10-325 MG PO TABS: 1.0000 | ORAL_TABLET | Freq: Every day | ORAL | 0 refills | Status: DC | PRN

## 2020-12-08 NOTE — Progress Notes (Signed)
Nursing Pain Medication Assessment:  Safety precautions to be maintained throughout the outpatient stay will include: orient to surroundings, keep bed in low position, maintain call bell within reach at all times, provide assistance with transfer out of bed and ambulation.  Medication Inspection Compliance: Pill count conducted under aseptic conditions, in front of the patient. Neither the pills nor the bottle was removed from the patient's sight at any time. Once count was completed pills were immediately returned to the patient in their original bottle.  Medication: Hydrocodone/APAP Pill/Patch Count:  02 of 45 pills remain Pill/Patch Appearance: Markings consistent with prescribed medication Bottle Appearance: Standard pharmacy container. Clearly labeled. Filled Date: 09 / 22 / 2022 Last Medication intake:  Yesterday

## 2020-12-08 NOTE — Progress Notes (Signed)
PROVIDER NOTE: Information contained herein reflects review and annotations entered in association with encounter. Interpretation of such information and data should be left to medically-trained personnel. Information provided to patient can be located elsewhere in the medical record under "Patient Instructions". Document created using STT-dictation technology, any transcriptional errors that may result from process are unintentional.    Patient: Charles Mooney  Service Category: E/M  Provider: Gillis Santa, MD  DOB: [birth year]  DOS: 12/08/2020  Specialty: Interventional Pain Management  MRN: [id number]  Setting: Ambulatory outpatient  PCP: Gale Journey, MD  Type: Established Patient    Referring Provider: Gale Journey, MD  Location: Office  Delivery: Face-to-face     HPI  Charles Mooney, a 65 y.o. 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 Charles Mooney's primary complain today is Back Pain (lower) and Shoulder Pain (right) Last encounter: My last encounter with him was on 06/07/2020. Pertinent problems: Charles Mooney has Chronic bilateral low back pain with bilateral sciatica; Acute bursitis of right shoulder; Nonallopathic lesion of sacral region; Lumbar facet arthropathy; Lumbar degenerative disc disease; Arthritis of right shoulder region; Encounter for long-term opiate analgesic use; Chronic SI joint pain; and Chronic pain syndrome on their pertinent problem list. Pain Assessment: Severity of Chronic pain is reported as a 5 /10. Location: Back Lower/down left leg to knee. Onset: More than a month ago. Quality: Aching, Constant, Dull, Sharp, Stabbing. Timing: Constant. Modifying factor(s): meds, ice, rest. Vitals:  height is 6' (1.829 m) and weight is 220 lb (99.8 kg). His temporal temperature is 96.9 F (36.1 C) (abnormal). His blood pressure is 151/89 (abnormal). His respiration is 15 and oxygen saturation is 100%.   Reason for encounter: both,  medication management and post-procedure assessment.    No change in medical history since last visit.  Patient's pain is at baseline.  Patient continues multimodal pain regimen as prescribed.  States that it provides pain relief and improvement in functional status. Having increased right shoulder pain, has had right shoulder injection in past with limited response (approx 2 years ago and the with me on 09/14/20 with limited long term benefit) and also has done PT in the past. Discussed suprascapular NB.  Good benefit from wrist injection between scaphoid and radial head. Pain decreased and better ROM   Post-Procedure Evaluation  Procedure (10/26/2020):   Type: Diagnostic Wrist Steroid Injection           Region: Dorsal Interspace between scaphoid and radial head Level: Wrist Laterality: Left  Anxiolysis: Please see nurses note.  Effectiveness during initial hour after procedure (Ultra-Short Term Relief): 100 %   Local anesthetic used: Long-acting (4-6 hours) Effectiveness: Defined as any analgesic benefit obtained secondary to the administration of local anesthetics. This carries significant diagnostic value as to the etiological location, or anatomical origin, of the pain. Duration of benefit is expected to coincide with the duration of the local anesthetic used.  Effectiveness during initial 4-6 hours after procedure (Short-Term Relief): 100 %   Long-term benefit: Defined as any relief past the pharmacologic duration of the local anesthetics.  Effectiveness past the initial 6 hours after procedure (Long-Term Relief): 70 %   Benefits, current: Defined as benefit present at the time of this evaluation.   Analgesia:  70% Function: Charles Mooney reports improvement in function ROM: Charles Mooney reports improvement in ROM    Pharmacotherapy Assessment  Analgesic: Hydrocodone 10 mg BID prn   Monitoring: [place] PMP: PDMP reviewed during  this encounter.       Pharmacotherapy: No side-effects or  adverse reactions reported. Compliance: No problems identified. Effectiveness: Clinically acceptable.  UDS:  Summary  Date Value Ref Range Status  06/07/2020 Note  Final    Comment:    ==================================================================== [employer] Select 13 (MW) ==================================================================== Specimen Alert Note: Urinary creatinine is low; ability to detect some drugs may be compromised. Interpret results with caution. (Creatinine) ==================================================================== Test                             Result       Flag       Units  Drug Present and Declared for Prescription Verification   Norhydrocodone                 611          EXPECTED   ng/mg creat    Norhydrocodone is an expected metabolite of hydrocodone.  Drug Absent but Declared for Prescription Verification   Hydrocodone                    Not Detected UNEXPECTED ng/mg creat    Hydrocodone is almost always present in patients taking this drug    consistently. Absence of hydrocodone could be due to lapse of time    since the last dose or unusual pharmacokinetics (rapid metabolism).  ==================================================================== Test                      Result    Flag   Units      Ref Range   Creatinine              18        LL     mg/dL      >=20 ==================================================================== Declared Medications:  The flagging and interpretation on this report are based on the  following declared medications.  Unexpected results may arise from  inaccuracies in the declared medications.   **Note: The testing scope of this panel includes these medications:   Hydrocodone (Norco)   **Note: The testing scope of this panel does not include the  following reported medications:   Acetaminophen (Norco)  Amlodipine (Norvasc)  Fexofenadine (Allegra)  Fluticasone (Flonase)  Meloxicam  (Mobic)  Supplement  Tizanidine (Zanaflex)  Turmeric  Vitamin B  Vitamin D3 ==================================================================== For clinical consultation, please call [phone]. ====================================================================       ROS  Constitutional: Denies any fever or chills Gastrointestinal: No reported hemesis, hematochezia, vomiting, or acute GI distress Musculoskeletal:  Right shoulder pain worse with abduction Neurological: No reported episodes of acute onset apraxia, aphasia, dysarthria, agnosia, amnesia, paralysis, loss of coordination, or loss of consciousness  Medication Review  HYDROcodone-acetaminophen, NON FORMULARY, Turmeric, amLODipine, b complex vitamins, cholecalciferol, fexofenadine, fluticasone, levocetirizine, meloxicam, montelukast, and tiZANidine  History Review  Allergy: Charles Mooney is allergic to sulfa antibiotics. Drug: Charles Mooney  reports that he does not currently use drugs. Alcohol:  reports current alcohol use. Tobacco:  reports that he quit smoking about 4 years ago. His smoking use included cigarettes. He has never used smokeless tobacco. Social: Charles Mooney  reports that he quit smoking about 4 years ago. His smoking use included cigarettes. He has never used smokeless tobacco. He reports current alcohol use. He reports that he does not currently use drugs. Medical:  has a past medical history of Hepatitis. Surgical: Charles Mooney  has a past surgical history  that includes Brain surgery; Joint replacement (Bilateral); and Shoulder arthroscopy with rotator cuff repair and open biceps tenodesis (Left, 09/08/2018). Family: family history is not on file.  Laboratory Chemistry Profile   Renal Lab Results  Component Value Date   BUN 18 09/04/2018   CREATININE 0.81 09/04/2018   GFRAA >60 09/04/2018   GFRNONAA >60 09/04/2018     Hepatic Lab Results  Component Value Date   AST 18 09/04/2018   ALT 20  09/04/2018   ALBUMIN 4.4 09/04/2018   ALKPHOS 39 09/04/2018     Electrolytes Lab Results  Component Value Date   NA 138 09/04/2018   K 3.9 09/04/2018   CL 105 09/04/2018   CALCIUM 9.2 09/04/2018     Bone No results found for: VD25OH, VD125OH2TOT, PO2423NT6, RW4315QM0, 25OHVITD1, 25OHVITD2, 25OHVITD3, TESTOFREE, TESTOSTERONE   Inflammation (CRP: Acute Phase) (ESR: Chronic Phase) No results found for: CRP, ESRSEDRATE, LATICACIDVEN     Note: Above Lab results reviewed.  Recent Imaging Review  DG PAIN CLINIC C-ARM 1-60 MIN NO REPORT Fluoro was used, but no Radiologist interpretation will be provided.  Please refer to "NOTES" tab for provider progress note. Note: Reviewed        CLINICAL DATA:  Right shoulder pain and limited range of motion for 6 months. No known injury.   EXAM: MRI OF THE RIGHT SHOULDER WITHOUT CONTRAST   TECHNIQUE: Multiplanar, multisequence MR imaging of the shoulder was performed. No intravenous contrast was administered.   COMPARISON:  None.   FINDINGS: Rotator cuff: There is extensive heterogeneous increased T2 signal and thickening of the rotator cuff tendons consistent with severe tendinopathy. Tendinopathy is worst in supraspinatus where virtually no normal-appearing tendon is seen.   Muscles: Intermediate intensity edema is seen in the supraspinatus muscle belly and there is very mild atrophy of the supraspinatus.   Biceps long head: There is severe tendinopathy of both the intra and extra-articular segments, worse in the intra-articular segment.   Acromioclavicular Joint: Moderately severe degenerative change is present. Type 1 acromion. There is subacromial spurring. A large volume of fluid is seen in the subacromial/subdeltoid bursa   Glenohumeral Joint: Mild degenerative change is seen. Prominent fluid is noted in the subscapularis recess.   Labrum: The superior labrum is degenerated but no tear is identified.   Bones:  No  fracture or focal lesion.   Other: None.   IMPRESSION: Severe rotator cuff tendinopathy is worst in the supraspinatus where virtually no normal-appearing tendon is identified but no focal tear is seen. There is mild atrophy of the supraspinatus muscle belly.   Tendinopathy of the intra and extra-articular long head of biceps is severe in the intra-articular segment.   Moderately severe acromioclavicular osteoarthritis. Subacromial spurring is noted.   Large volume of subacromial/subdeltoid fluid consistent with bursitis.     Electronically Signed   By: Inge Rise M.D.   On: 11/21/2017 09:08    Physical Exam  General appearance: Well nourished, well developed, and well hydrated. In no apparent acute distress Mental status: Alert, oriented x 3 (person, place, & time)       Respiratory: No evidence of acute respiratory distress Eyes: PERLA Vitals: BP (!) 151/89   Temp (!) 96.9 F (36.1 C) (Temporal)   Resp 15   Ht 6' (1.829 m)   Wt 220 lb (99.8 kg)   SpO2 100%   BMI 29.84 kg/m  BMI: Estimated body mass index is 29.84 kg/m as calculated from the following:   Height as of  this encounter: 6' (1.829 m).   Weight as of this encounter: 220 lb (99.8 kg). Ideal: Ideal body weight: 77.6 kg (171 lb 1.2 oz) Adjusted ideal body weight: 86.5 kg (190 lb 10.3 oz)  Upper Extremity (UE) Exam    Side: Right upper extremity  Side: Left upper extremity  Skin & Extremity Inspection: Skin color, temperature, and hair growth are WNL. No peripheral edema or cyanosis. No masses, redness, swelling, asymmetry, or associated skin lesions. No contractures.  Skin & Extremity Inspection: Skin color, temperature, and hair growth are WNL. No peripheral edema or cyanosis. No masses, redness, swelling, asymmetry, or associated skin lesions. No contractures.  Functional ROM: Pain restricted ROM          Functional ROM: Unrestricted ROM          Muscle Tone/Strength: Functionally intact. No obvious  neuro-muscular anomalies detected.  Muscle Tone/Strength: Functionally intact. No obvious neuro-muscular anomalies detected.  Sensory (Neurological): Unimpaired          Sensory (Neurological): Unimpaired          Palpation: No palpable anomalies              Palpation: No palpable anomalies              Provocative Test(s):  Phalen's test: deferred Tinel's test: deferred Apley's scratch test (touch opposite shoulder):  Action 1 (Across chest): Decreased ROM Action 2 (Overhead): Decreased ROM Action 3 (LB reach): Decreased ROM   Provocative Test(s):  Phalen's test: deferred Tinel's test: deferred Apley's scratch test (touch opposite shoulder):  Action 1 (Across chest): deferred Action 2 (Overhead): deferred Action 3 (LB reach): deferred     Lumbar Spine Area Exam  Skin & Axial Inspection: No masses, redness, or swelling Alignment: Symmetrical Functional ROM: Unrestricted ROM       Stability: No instability detected Muscle Tone/Strength: Functionally intact. No obvious neuro-muscular anomalies detected. Sensory (Neurological): Musculoskeletal pain pattern pain with facet loading   5 out of 5 strength bilateral lower extremity: Plantar flexion, dorsiflexion, knee flexion, knee extension.    Assessment   Status Diagnosis  Improved Having a Flare-up Having a Flare-up 1. Arthropathy of left wrist   2. Arthritis of right shoulder region   3. Arthritis of right glenohumeral joint   4. Lumbar facet arthropathy   5. Lumbar degenerative disc disease   6. Chronic pain syndrome        Plan of Care   Charles Mooney has a current medication list which includes the following long-term medication(s): amlodipine, fexofenadine, fluticasone, levocetirizine, montelukast, hydrocodone-acetaminophen, [START ON 01/07/2021] hydrocodone-acetaminophen, and [START ON 02/06/2021] hydrocodone-acetaminophen.  1.  Right shoulder arthropathy, right rotator cuff tendinopathy: Patient has tried  physical therapy in the past and is tried a right shoulder steroid injection over 2 years ago and on 09/14/20 with benefit for only 3 weeks.Marland Kitchen  He states that this was not under fluoroscopy.  We discussed suprascapular nerve block.    2.  Good benefit after left wrist injection, repeat prn  3.  Refill hydrocodone and tizanidine as below.  No change in dose.  Pharmacotherapy (Medications Ordered): Meds ordered this encounter  Medications   HYDROcodone-acetaminophen (NORCO) 10-325 MG tablet    Sig: Take 1-2 tablets by mouth daily as needed. For chronic pain syndrome    Dispense:  60 tablet    Refill:  0   HYDROcodone-acetaminophen (NORCO) 10-325 MG tablet    Sig: Take 1-2 tablets by mouth daily as needed. For chronic pain  syndrome    Dispense:  60 tablet    Refill:  0   HYDROcodone-acetaminophen (NORCO) 10-325 MG tablet    Sig: Take 1-2 tablets by mouth daily as needed. For chronic pain syndrome    Dispense:  60 tablet    Refill:  0   tiZANidine (ZANAFLEX) 4 MG tablet    Sig: Take 1.5 tablets (6 mg total) by mouth 2 (two) times daily as needed for muscle spasms.    Dispense:  60 tablet    Refill:  5    Orders:  No orders of the defined types were placed in this encounter.   Follow-up plan:   Return in about 3 months (around 03/10/2021) for Medication Management, in person.     Status post bilateral L3, L4, L5, S1 facet  medial branch nerve blocks #1 on 06/01/2019 50% pain relief for 3 days, consider repeating in future or consider sprint peripheral nerve stimulation of medial branch at L4       Recent Visits Date Type Provider Dept  10/26/20 Procedure visit Gillis Santa, MD [facility]  10/12/20 Telemedicine Gillis Santa, MD [facility]  09/14/20 Procedure visit Gillis Santa, MD [facility]  Showing recent visits within past 90 days and meeting all other requirements Today's Visits Date Type Provider Dept  12/08/20 Office Visit Gillis Santa, MD  [facility]  Showing today's visits and meeting all other requirements Future Appointments No visits were found meeting these conditions. Showing future appointments within next 90 days and meeting all other requirements I discussed the assessment and treatment plan with the patient. The patient was provided an opportunity to ask questions and all were answered. The patient agreed with the plan and demonstrated an understanding of the instructions.  Patient advised to call back or seek an in-person evaluation if the symptoms or condition worsens.  Duration of encounter: 30 minutes.  Note by: Gillis Santa, MD Date: 12/08/2020; Time: 9:10 AM

## 2020-12-12 ENCOUNTER — Other Ambulatory Visit: Payer: Self-pay | Admitting: Orthopedic Surgery

## 2020-12-12 DIAGNOSIS — M25511 Pain in right shoulder: Secondary | ICD-10-CM

## 2020-12-20 ENCOUNTER — Other Ambulatory Visit: Payer: Self-pay

## 2020-12-20 ENCOUNTER — Ambulatory Visit
Admission: RE | Admit: 2020-12-20 | Discharge: 2020-12-20 | Disposition: A | Discharge status: Home / Self Care | Payer: Medicare Other | Source: Ambulatory Visit | Attending: Orthopedic Surgery | Admitting: Orthopedic Surgery

## 2020-12-20 DIAGNOSIS — M25511 Pain in right shoulder: Secondary | ICD-10-CM | POA: Insufficient documentation

## 2020-12-21 ENCOUNTER — Other Ambulatory Visit: Payer: Self-pay | Admitting: Orthopedic Surgery

## 2020-12-21 ENCOUNTER — Encounter: Payer: Self-pay | Admitting: Orthopedic Surgery

## 2020-12-30 ENCOUNTER — Ambulatory Visit: Payer: Medicare Other | Admitting: Anesthesiology

## 2020-12-30 ENCOUNTER — Other Ambulatory Visit: Payer: Self-pay

## 2020-12-30 ENCOUNTER — Ambulatory Visit
Admission: RE | Admit: 2020-12-30 | Discharge: 2020-12-30 | Disposition: A | Discharge status: Home / Self Care | Payer: Medicare Other | Attending: Orthopedic Surgery | Admitting: Orthopedic Surgery

## 2020-12-30 ENCOUNTER — Encounter: Payer: Self-pay | Admitting: Orthopedic Surgery

## 2020-12-30 ENCOUNTER — Encounter
Admission: RE | Disposition: A | Discharge status: Home / Self Care | Payer: Self-pay | Source: Home / Self Care | Attending: Orthopedic Surgery

## 2020-12-30 DIAGNOSIS — M19011 Primary osteoarthritis, right shoulder: Secondary | ICD-10-CM | POA: Insufficient documentation

## 2020-12-30 DIAGNOSIS — M75101 Unspecified rotator cuff tear or rupture of right shoulder, not specified as traumatic: Secondary | ICD-10-CM | POA: Diagnosis present

## 2020-12-30 DIAGNOSIS — M7521 Bicipital tendinitis, right shoulder: Secondary | ICD-10-CM | POA: Diagnosis not present

## 2020-12-30 DIAGNOSIS — Z87891 Personal history of nicotine dependence: Secondary | ICD-10-CM | POA: Diagnosis not present

## 2020-12-30 HISTORY — DX: Unspecified osteoarthritis, unspecified site: M19.90

## 2020-12-30 HISTORY — DX: Essential (primary) hypertension: I10

## 2020-12-30 HISTORY — DX: Presence of external hearing-aid: Z97.4

## 2020-12-30 SURGERY — SHOULDER ARTHROSCOPY WITH SUBACROMIAL DECOMPRESSION AND DISTAL CLAVICLE EXCISION
Anesthesia: General | Site: Shoulder | Laterality: Right

## 2020-12-30 MED ORDER — MIDAZOLAM HCL 5 MG/5ML IJ SOLN
INTRAMUSCULAR | Status: DC | PRN
Start: 2020-12-30 — End: 2020-12-30
  Administered 2020-12-30: 2 mg via INTRAVENOUS

## 2020-12-30 MED ORDER — PROPOFOL 10 MG/ML IV BOLUS
INTRAVENOUS | Status: DC | PRN
  Administered 2020-12-30: 160 mg via INTRAVENOUS

## 2020-12-30 MED ORDER — ACETAMINOPHEN 160 MG/5ML PO SOLN: 325.0000 mg | ORAL | Status: DC | PRN

## 2020-12-30 MED ORDER — BUPIVACAINE LIPOSOME 1.3 % IJ SUSP
INTRAMUSCULAR | Status: DC | PRN
  Administered 2020-12-30: 20 mL via PERINEURAL

## 2020-12-30 MED ORDER — FENTANYL CITRATE (PF) 100 MCG/2ML IJ SOLN
INTRAMUSCULAR | Status: DC | PRN
  Administered 2020-12-30: 100 ug via INTRAVENOUS

## 2020-12-30 MED ORDER — DEXAMETHASONE SODIUM PHOSPHATE 4 MG/ML IJ SOLN
INTRAMUSCULAR | Status: DC | PRN
Start: 2020-12-30 — End: 2020-12-30
  Administered 2020-12-30: 4 mg via INTRAVENOUS

## 2020-12-30 MED ORDER — ACETAMINOPHEN 500 MG PO TABS: 1000.0000 mg | ORAL_TABLET | Freq: Three times a day (TID) | ORAL | 2 refills | Status: DC

## 2020-12-30 MED ORDER — ASPIRIN EC 325 MG PO TBEC: 325.0000 mg | DELAYED_RELEASE_TABLET | Freq: Every day | ORAL | 0 refills | Status: AC

## 2020-12-30 MED ORDER — FENTANYL CITRATE PF 50 MCG/ML IJ SOSY: 25.0000 ug | PREFILLED_SYRINGE | INTRAMUSCULAR | Status: DC | PRN

## 2020-12-30 MED ORDER — LIDOCAINE HCL (CARDIAC) PF 100 MG/5ML IV SOSY
PREFILLED_SYRINGE | INTRAVENOUS | Status: DC | PRN
  Administered 2020-12-30: 40 mg via INTRATRACHEAL

## 2020-12-30 MED ORDER — ONDANSETRON 4 MG PO TBDP: 4.0000 mg | ORAL_TABLET | Freq: Three times a day (TID) | ORAL | 0 refills | Status: DC | PRN

## 2020-12-30 MED ORDER — OXYCODONE HCL 5 MG/5ML PO SOLN: 5.0000 mg | Freq: Once | ORAL | Status: AC | PRN

## 2020-12-30 MED ORDER — EPHEDRINE SULFATE 50 MG/ML IJ SOLN
INTRAMUSCULAR | Status: DC | PRN
  Administered 2020-12-30 (×5): 5 mg via INTRAVENOUS

## 2020-12-30 MED ORDER — GLYCOPYRROLATE 0.2 MG/ML IJ SOLN
INTRAMUSCULAR | Status: DC | PRN
Start: 2020-12-30 — End: 2020-12-30
  Administered 2020-12-30: .1 mg via INTRAVENOUS

## 2020-12-30 MED ORDER — CEFAZOLIN SODIUM-DEXTROSE 2-4 GM/100ML-% IV SOLN
2.0000 g | INTRAVENOUS | Status: AC
  Administered 2020-12-30: 2 g via INTRAVENOUS

## 2020-12-30 MED ORDER — LACTATED RINGERS IV SOLN
INTRAVENOUS | Status: DC | PRN
  Administered 2020-12-30: 12000 mL

## 2020-12-30 MED ORDER — ACETAMINOPHEN 325 MG PO TABS: 325.0000 mg | ORAL_TABLET | ORAL | Status: DC | PRN

## 2020-12-30 MED ORDER — OXYCODONE HCL 5 MG PO TABS: 5.0000 mg | ORAL_TABLET | ORAL | 0 refills | Status: DC | PRN

## 2020-12-30 MED ORDER — OXYCODONE HCL 5 MG PO TABS
5.0000 mg | ORAL_TABLET | Freq: Once | ORAL | Status: AC | PRN
  Administered 2020-12-30: 5 mg via ORAL

## 2020-12-30 MED ORDER — ONDANSETRON HCL 4 MG/2ML IJ SOLN
INTRAMUSCULAR | Status: DC | PRN
  Administered 2020-12-30: 4 mg via INTRAVENOUS

## 2020-12-30 MED ORDER — LACTATED RINGERS IV SOLN: INTRAVENOUS | Status: DC

## 2020-12-30 MED ORDER — LACTATED RINGERS IR SOLN
Status: DC | PRN
  Administered 2020-12-30: 12000 mL
  Administered 2020-12-30: 6000 mL

## 2020-12-30 MED ORDER — BUPIVACAINE HCL (PF) 0.5 % IJ SOLN
INTRAMUSCULAR | Status: DC | PRN
  Administered 2020-12-30: 20 mL via PERINEURAL

## 2020-12-30 SURGICAL SUPPLY — 60 items
ADAPTER IRRIG TUBE 2 SPIKE SOL (ADAPTER) ×4 IMPLANT
ADH SKN CLS APL DERMABOND .7 (GAUZE/BANDAGES/DRESSINGS) ×1
ADPR TBG 2 SPK PMP STRL ASCP (ADAPTER) ×2
ANCH SUT 2 SWLK 19.1 CLS EYLT (Anchor) ×1 IMPLANT
ANCH SUT SWLK 19.1X4.75 (Anchor) ×4 IMPLANT
ANCHOR SUT BIO SW 4.75X19.1 (Anchor) ×4 IMPLANT
ANCHOR SWIVELOCK BIO 4.75X19.1 (Anchor) ×1 IMPLANT
ANCHOR TENDON ABSORBABLE (Anchor) ×1 IMPLANT
APL PRP STRL LF DISP 70% ISPRP (MISCELLANEOUS) ×1
BLADE FULL RADIUS 3.5 (BLADE) ×2 IMPLANT
BUR BR 5.5 WIDE MOUTH (BURR) ×2 IMPLANT
BUR RADIUS 4.0X18.5 (BURR) ×2 IMPLANT
CANNULA PART THRD DISP 5.75X7 (CANNULA) ×1 IMPLANT
CANNULA PARTIAL THREAD 2X7 (CANNULA) ×1 IMPLANT
CHLORAPREP W/TINT 26 (MISCELLANEOUS) ×2 IMPLANT
COOLER POLAR GLACIER W/PUMP (MISCELLANEOUS) ×2 IMPLANT
COVER LIGHT HANDLE UNIVERSAL (MISCELLANEOUS) ×4 IMPLANT
DERMABOND ADVANCED (GAUZE/BANDAGES/DRESSINGS) ×1
DERMABOND ADVANCED .7 DNX12 (GAUZE/BANDAGES/DRESSINGS) IMPLANT
DRAPE IMP U-DRAPE 54X76 (DRAPES) ×4 IMPLANT
DRAPE INCISE IOBAN 66X45 STRL (DRAPES) ×2 IMPLANT
DRAPE U-SHAPE 48X52 POLY STRL (PACKS) ×2 IMPLANT
DRSG TEGADERM 4X4.75 (GAUZE/BANDAGES/DRESSINGS) ×6 IMPLANT
ELECT REM PT RETURN 9FT ADLT (ELECTROSURGICAL) ×2
ELECTRODE REM PT RTRN 9FT ADLT (ELECTROSURGICAL) IMPLANT
GAUZE XEROFORM 1X8 LF (GAUZE/BANDAGES/DRESSINGS) ×2 IMPLANT
GLOVE SURG ENC MOIS LTX SZ7.5 (GLOVE) ×4 IMPLANT
GOWN STRL REIN 2XL XLG LVL4 (GOWN DISPOSABLE) ×2 IMPLANT
GOWN STRL REUS W/ TWL LRG LVL3 (GOWN DISPOSABLE) ×2 IMPLANT
GOWN STRL REUS W/TWL LRG LVL3 (GOWN DISPOSABLE) ×4
GRAFT TISS 40X70 1 THK DERM (Miscellaneous) IMPLANT
IV LACTATED RINGER IRRG 3000ML (IV SOLUTION) ×20
IV LR IRRIG 3000ML ARTHROMATIC (IV SOLUTION) ×6 IMPLANT
KIT STABILIZATION SHOULDER (MISCELLANEOUS) ×2 IMPLANT
KIT TURNOVER KIT A (KITS) ×2 IMPLANT
MANIFOLD NEPTUNE II (INSTRUMENTS) ×2 IMPLANT
MASK FACE SPIDER DISP (MASK) ×2 IMPLANT
MAT GRAY ABSORB FLUID 28X50 (MISCELLANEOUS) ×4 IMPLANT
PACK ARTHROSCOPY SHOULDER (MISCELLANEOUS) ×2 IMPLANT
PAD WRAPON POLAR SHDR XLG (MISCELLANEOUS) ×1 IMPLANT
PASSER SUT FIRSTPASS SELF (INSTRUMENTS) ×1 IMPLANT
PATCH DERMIS DECELL ARTHRO 40 (Miscellaneous) ×2 IMPLANT
PENCIL SMOKE EVACUATOR (MISCELLANEOUS) ×2 IMPLANT
SPONGE GAUZE 2X2 8PLY STRL LF (GAUZE/BANDAGES/DRESSINGS) ×8 IMPLANT
SUT ETHILON 3-0 FS-10 30 BLK (SUTURE) ×2
SUT MNCRL 4-0 (SUTURE) ×2
SUT MNCRL 4-0 27XMFL (SUTURE) ×1
SUT VIC AB 0 CT1 36 (SUTURE) ×2 IMPLANT
SUT VIC AB 2-0 CT2 27 (SUTURE) ×1 IMPLANT
SUTURE EHLN 3-0 FS-10 30 BLK (SUTURE) ×1 IMPLANT
SUTURE MNCRL 4-0 27XMF (SUTURE) IMPLANT
SUTURETAPE 1.3 40 W/NDL BLK/WH (SUTURE) ×3 IMPLANT
SUTURETAPE 1.3 40 W/NDL BLUE (SUTURE) ×4 IMPLANT
SYR 10ML LL (SYRINGE) ×2 IMPLANT
SYSTEM FBRTK BICEPS 1.9 DRILL (Anchor) ×1 IMPLANT
TAPE MICROFOAM 4IN (TAPE) ×2 IMPLANT
TUBING INFLOW SET DBFLO PUMP (TUBING) ×2 IMPLANT
TUBING OUTFLOW SET DBLFO PUMP (TUBING) ×2 IMPLANT
WAND WEREWOLF FLOW 90D (MISCELLANEOUS) ×2 IMPLANT
WRAPON POLAR PAD SHDR XLG (MISCELLANEOUS) ×2

## 2020-12-30 NOTE — Anesthesia Postprocedure Evaluation (Signed)
Anesthesia Post Note  Patient: Charles Mooney  Procedure(s) Performed: Right shoulder arthroscopic subscapularis repair, mini-open supraspinatus repair, subacromial decompression, distal clavicle excision, and biceps tenodesis. (Right: Shoulder)     Patient location during evaluation: PACU Anesthesia Type: General Level of consciousness: awake Pain management: pain level controlled Vital Signs Assessment: post-procedure vital signs reviewed and stable Respiratory status: respiratory function stable Cardiovascular status: stable Postop Assessment: no signs of nausea or vomiting Anesthetic complications: no   No notable events documented.  Jola Babinski

## 2020-12-30 NOTE — Progress Notes (Signed)
Assisted Elsje Harker, ANMD  with right, ultrasound guided, interscalene  block. Side rails up, monitors on throughout procedure. See vital signs in flow sheet. Tolerated Procedure well.  

## 2020-12-30 NOTE — Discharge Instructions (Addendum)
Post-Op Instructions - Rotator Cuff Repair  1. Bracing: You will wear a shoulder immobilizer or sling for 6 weeks.   2. Driving: No driving for 3 weeks post-op. When driving, do not wear the immobilizer. Ideally, we recommend no driving for 6 weeks while sling is in place as one arm will be immobilized.   3. Activity: No active lifting for 2 months. Wrist, hand, and elbow motion only. Avoid lifting the upper arm away from the body except for hygiene. You are permitted to bend and straighten the elbow passively only (no active elbow motion). You may use your hand and wrist for typing, writing, and managing utensils (cutting food). Do not lift more than a coffee cup for 8 weeks.  When sleeping or resting, inclined positions (recliner chair or wedge pillow) and a pillow under the forearm for support may provide better comfort for up to 4 weeks.  Avoid long distance travel for 4 weeks.  Return to normal activities after rotator cuff repair repair normally takes 6 months on average. If rehab goes very well, may be able to do most activities at 4 months, except overhead or contact sports.  4. Physical Therapy: Begins 3-4 days after surgery, and proceed 1 time per week for the first 6 weeks, then 1-2 times per week from weeks 6-20 post-op.  5. Medications:  - You will be provided a prescription for narcotic pain medicine. After surgery, take 1-2 narcotic tablets every 4 hours if needed for severe pain.  - A prescription for anti-nausea medication will be provided in case the narcotic medicine causes nausea - take 1 tablet every 6 hours only if nauseated.   - Take tylenol 1000 mg (2 Extra Strength tablets or 3 regular strength) every 8 hours for pain.  May decrease or stop tylenol 5 days after surgery if you are having minimal pain. - Take ASA 325mg/day x 2 weeks to help prevent DVTs/PEs (blood clots).  - DO NOT take ANY nonsteroidal anti-inflammatory pain medications (Advil, Motrin, Ibuprofen, Aleve,  Naproxen, or Naprosyn). These medicines can inhibit healing of your shoulder repair.    If you are taking prescription medication for anxiety, depression, insomnia, muscle spasm, chronic pain, or for attention deficit disorder, you are advised that you are at a higher risk of adverse effects with use of narcotics post-op, including narcotic addiction/dependence, depressed breathing, death. If you use non-prescribed substances: alcohol, marijuana, cocaine, heroin, methamphetamines, etc., you are at a higher risk of adverse effects with use of narcotics post-op, including narcotic addiction/dependence, depressed breathing, death. You are advised that taking > 50 morphine milligram equivalents (MME) of narcotic pain medication per day results in twice the risk of overdose or death. For your prescription provided: oxycodone 5 mg - taking more than 6 tablets per day would result in > 50 morphine milligram equivalents (MME) of narcotic pain medication. Be advised that we will prescribe narcotics short-term, for acute post-operative pain only - 3 weeks for major operations such as shoulder repair/reconstruction surgeries.     6. Post-Op Appointment:  Your first post-op appointment will be 10-14 days post-op.  7. Work or School: For most, but not all procedures, we advise staying out of work or school for at least 1 to 2 weeks in order to recover from the stress of surgery and to allow time for healing.   If you need a work or school note this can be provided.   8. Smoking: If you are a smoker, you need to refrain from   smoking in the postoperative period. The nicotine in cigarettes will inhibit healing of your shoulder repair and decrease the chance of successful repair. Similarly, nicotine containing products (gum, patches) should be avoided.   Post-operative Brace: Apply and remove the brace you received as you were instructed to at the time of fitting and as described in detail as the brace's  instructions for use indicate.  Wear the brace for the period of time prescribed by your physician.  The brace can be cleaned with soap and water and allowed to air dry only.  Should the brace result in increased pain, decreased feeling (numbness/tingling), increased swelling or an overall worsening of your medical condition, please contact your doctor immediately.  If an emergency situation occurs as a result of wearing the brace after normal business hours, please dial 911 and seek immediate medical attention.  Let your doctor know if you have any further questions about the brace issued to you. Refer to the shoulder sling instructions for use if you have any questions regarding the correct fit of your shoulder sling.  [facility] Customer Care for Troubleshooting: [phone]  Video that illustrates how to properly use a shoulder sling: "Instructions for Proper Use of an Orthopaedic Sling" https://www.youtube.com/watch?v=AHZpn_Xo45w        PERIPHERAL NERVE BLOCK PATIENT INFORMATION  Your surgeon has requested a peripheral nerve block for your surgery. This anesthetic technique provides excellent post-operative pain relief for you in a safe and effective manner. It will also help reduce the risk of nausea and vomiting and allow earlier discharge from the hospital.   The block is performed under sedation with ultrasound guidance prior to your procedure. Due to the sedation, your may or may not remember the block experience. The nerve block will begin to take effect anywhere from 5 to 30 minutes after being administered. You will be transported to the operating room from your surgery after the block is completed.   At the end of surgery, when the anesthesia wears off, you will notice a few things. Your may not be able to move or feel the part of your body targeted by the nerve block. These are normal experiences, and they will disappear as the block wears off.  If you had an interscalene nerve block  performed (which is common for shoulder surgery), your voice can be very hoarse and you may feel that you are not able to take as deep a breath as you did before surgery. Some patients may also notice a droopy eyelid on the affected side. These symptoms will resolve once the block wears off.  Pain control: The nerve block technique used is a single injection that can last anywhere from 1-3 days. The duration of the numbness can vary between individuals. After leaving the hospital, it is important that you begin to take your prescribed pain medication when you start to sense the nerve block wearing off. This will help you avoid unpleasant pain at the time the nerve block wears off, which can sometimes be in the middle of the night. The block will only cover pain in the areas targeted by the nerve block so if you experience surgical pain outside of that area, please take your prescribed pain medication. Management of the "numb area": After a nerve block, you cannot feel pain, pressure, or temperature in the affected area so there is an increased risk for injury. You should take extra care to protect the affected areas until sensation and movement returns. Please take caution to not   come in contact with extremely hot or cold items because you will not be able to sense or protect yourself form the extremes of temperature.  You may experience some persistent numbness after the procedure by most neurological deficits resolve over time and the incidence of serious long term neurological complications attributable to peripheral nerve blocks are relatively uncommon.     Information for Discharge Teaching: EXPAREL (bupivacaine liposome injectable suspension)   Your surgeon or anesthesiologist gave you EXPAREL(bupivacaine) to help control your pain after surgery.  EXPAREL is a local anesthetic that provides pain relief by numbing the tissue around the surgical site. EXPAREL is designed to release pain medication  over time and can control pain for up to 72 hours. Depending on how you respond to EXPAREL, you may require less pain medication during your recovery.  Possible side effects: Temporary loss of sensation or ability to move in the area where bupivacaine was injected. Nausea, vomiting, constipation Rarely, numbness and tingling in your mouth or lips, lightheadedness, or anxiety may occur. Call your doctor right away if you think you may be experiencing any of these sensations, or if you have other questions regarding possible side effects.  Follow all other discharge instructions given to you by your surgeon or nurse. Eat a healthy diet and drink plenty of water or other fluids.  If you return to the hospital for any reason within 96 hours following the administration of EXPAREL, it is important for health care providers to know that you have received this anesthetic. A teal colored band has been placed on your arm with the date, time and amount of EXPAREL you have received in order to alert and inform your health care providers. Please leave this armband in place for the full 96 hours following administration, and then you may remove the band.  

## 2020-12-30 NOTE — H&P (Signed)
Paper H&P to be scanned into permanent record. H&P reviewed. No significant changes noted.  

## 2020-12-30 NOTE — Op Note (Signed)
SURGERY DATE:  12/30/2020   PRE-OP DIAGNOSIS:  1. Right rotator cuff tear (subscapularis, supraspinatus) 2. Right subacromial impingement 3. Right biceps tendinopathy 4. Right acromioclavicular joint arthritis  POST-OP DIAGNOSIS: 1. Right rotator cuff tear (subscapularis, supraspinatus) 2. Right subacromial impingement 3. Right biceps tendinopathy 4. Right acromioclavicular joint arthritis   PROCEDURES:  1. Right arthroscopic rotator cuff repair (subscapularis) 2. Right mini-open rotator cuff repair (supraspinatus and infraspinatus) 3. Right open biceps tenodesis 4. Right arthroscopic extensive debridement of shoulder (glenohumeral and subacromial spaces) 5. Right arthroscopic subacromial decompression 6.  Right arthroscopic distal clavicle excision   SURGEON: Rosealee Albee, MD   ASSISTANT: Sonny Dandy, PA; Forrestine Him, PA-S   ANESTHESIA: Gen with Exparel interscalene block   ESTIMATED BLOOD LOSS: 25cc   DRAINS:  none   TOTAL IV FLUIDS: per anesthesia      SPECIMENS: none   IMPLANTS: - Arthrex 4.70mm SwiveLock x 5 - Arthrex FiberTak Suture Anchor - Double Loaded x1 - Arthroflex 67mm allograft     OPERATIVE FINDINGS:  Examination under anesthesia: A careful examination under anesthesia was performed.  Passive range of motion was: FF: 150; ER at side: 60; ER in abduction: 110; IR in abduction: 50.  Anterior load shift: NT.  Posterior load shift: NT.  Sulcus in neutral: NT.  Sulcus in ER: NT.     Intra-operative findings: A thorough arthroscopic examination of the shoulder was performed.  The findings are: 1. Biceps tendon: Medial subluxation and severe tendinopathy with thickening and split thickness tearing along the entire intra-articular portion 2. Superior labrum: injected with surrounding synovitis 3. Posterior labrum and capsule: normal 4. Inferior capsule and inferior recess: normal 5. Glenoid cartilage surface: Grade 3-4 degenerative change of the inferior  glenoid 6. Supraspinatus attachment: full-thickness tear at the musculotendinous junction extending to the supraspinatus/infraspinatus border and small tear of the anterolateral insertion of the supraspinatus 7. Posterior rotator cuff attachment: Normal 8. Humeral head articular cartilage: Focal areas of grade 2 degenerative change 9. Rotator interval: significant synovitis 10: Subscapularis tendon: Full-thickness subscapularis tear 11. Anterior labrum: degenerative 12. IGHL: normal   OPERATIVE REPORT:    Indications for procedure:  Charles Mooney is a 65 y.o. male with chronic shoulder pain for many years that significantly worsened after a traumatic incident 4 months ago where his dog took off in the woods. He has had difficulty lifting his arm over his head since that time.  Corticosteroid injection, medications, and activity modifications without complete improvement in his symptoms.  Clinical exam and MRI were suggestive of rotator cuff tear including subscapularis and supraspinatus tears; subacromial impingement; AC joint arthritis and biceps tendinopathy. Given these findings, we decided to proceed with surgical management. After discussion of risks, benefits, and alternatives to surgery, the patient elected to proceed.    Procedure in detail: I identified Charles Mooney in the pre-operative holding area.  I marked the operative shoulder with my initials. I reviewed the risks and benefits of the proposed surgical intervention, and the patient wished to proceed.  Anesthesia was then performed with an Exparel interscalene block.  The patient was transferred to the operative suite and placed in the beach chair position.     Appropriate IV antibiotics were administered prior to incision. The operative upper extremity was then prepped and draped in standard fashion. A time out was performed confirming the correct extremity, correct patient, and correct procedure.    I then created a standard  posterior portal with an 11 blade. The  glenohumeral joint was easily entered with a blunt trocar and the arthroscope introduced. The findings of diagnostic arthroscopy are described above.  A standard anterior portal was made.  I debrided degenerative tissue including the synovitic tissue about the rotator interval as well as the anterior and superior labrum. I then coagulated the inflamed synovium to obtain hemostasis and reduce the risk of post-operative swelling using an Arthrocare radiofrequency device. The biceps tendon was cut at its insertion on the superior labrum with arthroscopic scissors.  An ArthroCare wand was used to debride the biceps anchor complex.  Additionally an oscillating shaver was used to debride the degenerative changes to the inferior glenoid.   The subscapularis tear was identified.  A superior anterolateral portal was made under needle localization.  A 7 mm cannula was placed.  The comma tissue indicating the superolateral border of the subscapularis was identified readily.  The tip of the coracoid as well as the conjoined tendon and coracoacromial ligaments were visualized after debriding rotator interval tissue.  Tissue about the subscapularis was released anteriorly, superiorly, and posteriorly to allow for improved mobilization.  A 70 degree arthroscope was utilized to prepare the lesser tuberosity footprint with a combination of electrocautery and burr. 2 suture tapes were passed in a mattress fashion.  All 4 strands of suture were then loaded onto a 4.75 mm SwiveLock anchor and placed into the prepared footprint with the arm in a neutral position.  The knotless mechanism of the anchor was used to pass an additional stitch at the superior border that was then shuttled down to the anchor.  This construct appropriately reduced the subscapularis tear.  The arm was then internally and externally rotated and the subscapularis was noted to move appropriately with rotation.  The remainder  of the suture was then cut.   Next, the arthroscope was then introduced into the subacromial space.  An extensive subacromial bursectomy was performed using a combination of the shaver and Arthrocare wand. The entire acromial undersurface was exposed and the CA ligament was subperiosteally elevated to expose the anterior acromial hook. A 5.51mm barrel burr was used to create a flat anterior and lateral aspect of the acromion, converting it from a Type 2 to a Type 1 acromion. Care was made to keep the deltoid fascia intact.   I then turned my attention to the arthroscopic distal clavicle excision. I identified the acromioclavicular joint. Surrounding bursal tissue was debrided and the edges of the joint were identified. I used the 5.71mm barrel burr to remove the distal clavicle parallel to the edge of the acromion. I was able to fit two widths of the burr into the space between the distal clavicle and acromion, signifying that I had removed ~15mm of distal clavicle. This was confirmed by viewing anteriorly and introducing a probe with measuring marks from the lateral portal. Hemostasis was achieved with an Arthrocare wand. Fluid was evacuated from the shoulder.  This concluded the arthroscopic portion of the procedure.  Given the complexity of the supraspinatus tear, decision was made to perform a separate mini open approach.  A longitudinal incision from the anterolateral acromion ~7cm in length was made overlying the raphe between the anterior and middle heads of the deltoid.  This incision also incorporated the anterolateral portal.  The raphe was identified and it was incised. The subacromial space was identified. Any remaining bursa was excised. The rotator cuff tear involving the supraspinatus and the anterior infraspinatus was identified. It was a tear at the musculotendinous junction.  There is also a small tear of the lateral insertion anteriorly.    We then turned our attention to the biceps  tenodesis. The arm was externally rotated.  The bicipital groove was identified.  A 15 blade was used to make a cut overlying the biceps tendon, and the tendon was removed using a right angle clamp.  The base of the bicipital groove was identified and cleared of soft tissue.  A FiberTak anchor was placed in the bicipital groove.  The biceps tendon was held at the appropriate amount of tension.  One set of sutures was passed through the biceps anchor with one limb passed in a simple fashion and the second limb passed in a simple plus locking stitch pattern.  This was repeated for the other set of sutures.  This construct allowed for shuttling the biceps tendon down to the bone.  The sutures were tied and cut.  The diseased portion of the proximal biceps was then excised.   The arm was then internally rotated.  The lateral tendon stump was mildly delaminated from the footprint.  This region of the footprint was smoothed and a rongeur was used to create a smooth, bleeding bed.  Using a rotator cuff grasper, I could reduce the medial rotator cuff to the lateral tendon stump.  Two 4.75 mm SwiveLock anchors double loaded with suture tape were placed just lateral to the articular margin. The FiberWire sutures from both anchors were passed in a side-to-side fashion, 1 strand in the lateral tendon stump, and the other strand in the medial tendon.  3 additional margin convergence type stitches were placed in a side-to-side fashion.  The other 8 strands of suture tape from the anchors were then passed into the tendinous portion of the medial stump.  With the arm in slight abduction, all margin convergence stitches (3 tapes and 2 FiberWire from the anchors) were tied, reducing the medial and lateral ends of the cuff. Two SwiveLock anchors were placed for the lateral row anchors with one limb of each of the two medial row sutures passed through each anchor.  This allowed for excellent reapproximation and compression of the  rotator cuff over its footprint. The construct was stable with external and internal rotation.  This also reduced to the small anterolateral insertion tear of the supraspinatus.  Given the size of the tear and slightly thinned quality of the tissue, using the Arthrex tissue tak device, a 1 mm allograft was placed overlying the rotator cuff repair site.  Tendon staples were placed medially.  The knotless mechanism of the lateral row SwiveLock anchors were utilized to achieve appropriate lateral fixation of the graft.   The wound was thoroughly irrigated.  The deltoid split was closed with 0 Vicryl.  The subdermal layer was closed with 2-0 Vicryl.  The skin was closed with 4-0 Monocryl and Dermabond. The portals were closed with 3-0 Nylon. Xeroform was applied to the incisions. A sterile dressing was applied, followed by a Polar Care sleeve and a SlingShot shoulder immobilizer/sling. The patient was awakened from anesthesia without difficulty and was transferred to the PACU in stable condition.    Of note, assistance from a PA was essential to performing the surgery.  PA was present for the entire surgery.  PA assisted with patient positioning, retraction, instrumentation, and wound closure. The surgery would have been more difficult and had longer operative time without PA assistance.    Additionally, there was significantly increased complexity to the surgery due to the  pattern of the supraspinatus tear.  It was located at the musculotendinous junction.  This necessitated multiple side-to-side stitches in addition to standard rotator cuff repair.  Additionally an allograft was utilized for augmentation to enhance postoperative healing.  This tear pattern added approximately 45 minutes minutes to the surgical time compared to standard mini open superior rotator cuff repair.     COMPLICATIONS: none   DISPOSITION: plan for discharge home after recovery in PACU     POSTOPERATIVE PLAN: Remain in sling  (except hygiene and elbow/wrist/hand RoM exercises as instructed by PT) x 6 weeks and NWB for this time. PT to begin 3-4 days after surgery.  Use large rotator cuff repair rehab protocol with subscapularis restrictions.  ASA 325mg  daily x 2 weeks for DVT ppx.

## 2020-12-30 NOTE — Anesthesia Procedure Notes (Signed)
Anesthesia Regional Block: Interscalene brachial plexus block   Pre-Anesthetic Checklist: , timeout performed,  Correct Patient, Correct Site, Correct Laterality,  Correct Procedure, Correct Position, site marked,  Risks and benefits discussed,  Surgical consent,  Pre-op evaluation,  At surgeon's request and post-op pain management  Laterality: Right  Prep: chloraprep       Needles:  Injection technique: Single-shot  Needle Type: Stimiplex     Needle Length: 10cm  Needle Gauge: 21     Additional Needles:   Procedures:,,,, ultrasound used (permanent image in chart),,    Narrative:  Start time: 12/30/2020 7:07 AM End time: 12/30/2020 7:11 AM Injection made incrementally with aspirations every 5 mL.  Performed by: Personally  Anesthesiologist: Jola Babinski, MD  Additional Notes: Functioning IV was confirmed and monitors applied. Ultrasound guidance: relevant anatomy identified, needle position confirmed, local anesthetic spread visualized around nerve(s)., vascular puncture avoided.  Image printed for medical record.  Negative aspiration and no paresthesias; incremental administration of local anesthetic. The patient tolerated the procedure well. Vitals signs recorded in RN notes.

## 2020-12-30 NOTE — Anesthesia Procedure Notes (Signed)
Procedure Name: LMA Insertion Date/Time: 12/30/2020 7:46 AM Performed by: Jimmy Picket, CRNA Pre-anesthesia Checklist: Patient identified, Emergency Drugs available, Suction available, Timeout performed and Patient being monitored Patient Re-evaluated:Patient Re-evaluated prior to induction Oxygen Delivery Method: Circle system utilized Preoxygenation: Pre-oxygenation with 100% oxygen Induction Type: IV induction LMA: LMA inserted LMA Size: 4.0 Number of attempts: 1 Placement Confirmation: positive ETCO2 and breath sounds checked- equal and bilateral Tube secured with: Tape

## 2020-12-30 NOTE — Transfer of Care (Signed)
Immediate Anesthesia Transfer of Care Note  Patient: Charles Mooney  Procedure(s) Performed: Right shoulder arthroscopic subscapularis repair, mini-open supraspinatus repair, subacromial decompression, distal clavicle excision, and biceps tenodesis. (Right: Shoulder)  Patient Location: PACU  Anesthesia Type: General  Level of Consciousness: awake, alert  and patient cooperative  Airway and Oxygen Therapy: Patient Spontanous Breathing and Patient connected to supplemental oxygen  Post-op Assessment: Post-op Vital signs reviewed, Patient's Cardiovascular Status Stable, Respiratory Function Stable, Patent Airway and No signs of Nausea or vomiting  Post-op Vital Signs: Reviewed and stable  Complications: No notable events documented.

## 2020-12-30 NOTE — Anesthesia Preprocedure Evaluation (Signed)
Anesthesia Evaluation  Patient identified by MRN, date of birth, ID band Patient awake    Reviewed: Allergy & Precautions, NPO status   Airway Mallampati: II  TM Distance: >3 FB     Dental   Pulmonary former smoker,    Pulmonary exam normal        Cardiovascular hypertension,  Rhythm:Regular Rate:Normal     Neuro/Psych Hx subarachnoid hemorrhage 2017    GI/Hepatic (+) Hepatitis - (treated)  Endo/Other    Renal/GU      Musculoskeletal  (+) Arthritis ,   Abdominal   Peds  Hematology   Anesthesia Other Findings   Reproductive/Obstetrics                             Anesthesia Physical Anesthesia Plan  ASA: 2  Anesthesia Plan: General   Post-op Pain Management:  Regional for Post-op pain   Induction: Intravenous  PONV Risk Score and Plan: 2 and Treatment may vary due to age or medical condition, Ondansetron, Dexamethasone and Midazolam  Airway Management Planned: LMA  Additional Equipment:   Intra-op Plan:   Post-operative Plan:   Informed Consent: I have reviewed the patients History and Physical, chart, labs and discussed the procedure including the risks, benefits and alternatives for the proposed anesthesia with the patient or authorized representative who has indicated his/her understanding and acceptance.     Dental advisory given  Plan Discussed with: CRNA  Anesthesia Plan Comments:         Anesthesia Quick Evaluation

## 2021-01-19 ENCOUNTER — Other Ambulatory Visit: Payer: Self-pay | Admitting: Student in an Organized Health Care Education/Training Program

## 2021-01-19 DIAGNOSIS — M5136 Other intervertebral disc degeneration, lumbar region: Secondary | ICD-10-CM

## 2021-01-19 DIAGNOSIS — G894 Chronic pain syndrome: Secondary | ICD-10-CM

## 2021-01-19 DIAGNOSIS — M47816 Spondylosis without myelopathy or radiculopathy, lumbar region: Secondary | ICD-10-CM

## 2021-02-20 ENCOUNTER — Other Ambulatory Visit: Payer: Self-pay | Admitting: Student in an Organized Health Care Education/Training Program

## 2021-02-20 DIAGNOSIS — M5136 Other intervertebral disc degeneration, lumbar region: Secondary | ICD-10-CM

## 2021-02-20 DIAGNOSIS — M47816 Spondylosis without myelopathy or radiculopathy, lumbar region: Secondary | ICD-10-CM

## 2021-02-20 DIAGNOSIS — G894 Chronic pain syndrome: Secondary | ICD-10-CM

## 2021-03-07 ENCOUNTER — Other Ambulatory Visit: Payer: Self-pay

## 2021-03-07 ENCOUNTER — Ambulatory Visit
Payer: Medicare Other | Attending: Student in an Organized Health Care Education/Training Program | Admitting: Student in an Organized Health Care Education/Training Program

## 2021-03-07 ENCOUNTER — Encounter: Payer: Self-pay | Admitting: Student in an Organized Health Care Education/Training Program

## 2021-03-07 VITALS — BP 146/99 | HR 81 | Temp 98.2°F | Resp 16 | Ht 72.0 in | Wt 234.2 lb

## 2021-03-07 DIAGNOSIS — M19032 Primary osteoarthritis, left wrist: Secondary | ICD-10-CM | POA: Insufficient documentation

## 2021-03-07 DIAGNOSIS — G894 Chronic pain syndrome: Secondary | ICD-10-CM | POA: Diagnosis present

## 2021-03-07 DIAGNOSIS — M5136 Other intervertebral disc degeneration, lumbar region: Secondary | ICD-10-CM | POA: Insufficient documentation

## 2021-03-07 DIAGNOSIS — M19011 Primary osteoarthritis, right shoulder: Secondary | ICD-10-CM | POA: Diagnosis present

## 2021-03-07 DIAGNOSIS — M47816 Spondylosis without myelopathy or radiculopathy, lumbar region: Secondary | ICD-10-CM | POA: Diagnosis present

## 2021-03-07 MED ORDER — HYDROCODONE-ACETAMINOPHEN 10-325 MG PO TABS: 1.0000 | ORAL_TABLET | Freq: Every day | ORAL | 0 refills | Status: DC | PRN

## 2021-03-07 NOTE — Progress Notes (Signed)
Nursing Pain Medication Assessment:  Safety precautions to be maintained throughout the outpatient stay will include: orient to surroundings, keep bed in low position, maintain call bell within reach at all times, provide assistance with transfer out of bed and ambulation.  Medication Inspection Compliance: Pill count conducted under aseptic conditions, in front of the patient. Neither the pills nor the bottle was removed from the patient's sight at any time. Once count was completed pills were immediately returned to the patient in their original bottle.  Medication: Hydrocodone/APAP Pill/Patch Count:  25 of 60 pills remain Pill/Patch Appearance: Markings consistent with prescribed medication Bottle Appearance: Standard pharmacy container. Clearly labeled. Filled Date: 01 / 02 / 2023 Last Medication intake:  Today

## 2021-03-07 NOTE — Progress Notes (Signed)
PROVIDER NOTE: Information contained herein reflects review and annotations entered in association with encounter. Interpretation of such information and data should be left to medically-trained personnel. Information provided to patient can be located elsewhere in the medical record under "Patient Instructions". Document created using STT-dictation technology, any transcriptional errors that may result from process are unintentional.    Patient: Charles Mooney  Service Category: E/M  Provider: Gillis Santa, MD  DOB: October 09, 1955  DOS: 03/07/2021  Specialty: Interventional Pain Management  MRN: [id number]  Setting: Ambulatory outpatient  PCP: Gladstone Lighter, MD  Type: Established Patient    Referring Provider: Gale Journey, MD  Location: Office  Delivery: Face-to-face     HPI  Charles Mooney, a 66 y.o. 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 Charles Mooney's primary complain today is Back Pain (Lumbar left is worse ) and Shoulder Pain (Right s/p rotator cuff surgery )  Last encounter: My last encounter with him was on 06/07/2020. Pertinent problems: Charles Mooney has Chronic bilateral low back pain with bilateral sciatica; Acute bursitis of right shoulder; Nonallopathic lesion of sacral region; Lumbar facet arthropathy; Lumbar degenerative disc disease; Arthritis of right shoulder region; Encounter for long-term opiate analgesic use; Chronic SI joint pain; and Chronic pain syndrome on their pertinent problem list. Pain Assessment: Severity of Chronic pain is reported as a 7 /10. Location: Back Lower, Left/down the left leg to the knee. Onset: More than a month ago. Quality: Discomfort, Constant, Numbness, Tingling, Burning, Stabbing, Dull, Aching. Timing: Constant. Modifying factor(s): medications. Vitals:  height is 6' (1.829 m) and weight is 234 lb 3.2 oz (106.2 kg). His temporal temperature is 98.2 F (36.8 C). His blood pressure is 146/99 (abnormal) and  his pulse is 81. His respiration is 16 and oxygen saturation is 97%.   Reason for encounter: medication management.    Patient follows up today for medication management.  Status post right shoulder arthroscopy for right rotator cuff tear.  This was done towards the end of November.  He has been participating in physical therapy and has been noting progress in terms of his range of motion and overall recovery.  He did have to take increased hydrocodone on some days specifically working with physical therapy.  I counseled the patient on this but it is reasonable in the context of his acute on chronic pain status post shoulder surgery.  He will be released from physical therapy next month and plans to continue home PT exercises.     Pharmacotherapy Assessment  Analgesic: Hydrocodone 10 mg BID prn   Monitoring: [place] PMP: PDMP reviewed during this encounter.       Pharmacotherapy: No side-effects or adverse reactions reported. Compliance: No problems identified. Effectiveness: Clinically acceptable.  UDS:  Summary  Date Value Ref Range Status  06/07/2020 Note  Final    Comment:    ==================================================================== [employer] [facility] (MW) ==================================================================== Specimen Alert Note: Urinary creatinine is low; ability to detect some drugs may be compromised. Interpret results with caution. (Creatinine) ==================================================================== Test                             Result       Flag       Units  Drug Present and Declared for Prescription Verification   Norhydrocodone                 611  EXPECTED   ng/mg creat    Norhydrocodone is an expected metabolite of hydrocodone.  Drug Absent but Declared for Prescription Verification   Hydrocodone                    Not Detected UNEXPECTED ng/mg creat    Hydrocodone is almost always present in patients taking this drug     consistently. Absence of hydrocodone could be due to lapse of time    since the last dose or unusual pharmacokinetics (rapid metabolism).  ==================================================================== Test                      Result    Flag   Units      Ref Range   Creatinine              18        LL     mg/dL      >=20 ==================================================================== Declared Medications:  The flagging and interpretation on this report are based on the  following declared medications.  Unexpected results may arise from  inaccuracies in the declared medications.   **Note: The testing scope of this panel includes these medications:   Hydrocodone (Norco)   **Note: The testing scope of this panel does not include the  following reported medications:   Acetaminophen (Norco)  Amlodipine (Norvasc)  Fexofenadine (Allegra)  Fluticasone (Flonase)  Meloxicam (Mobic)  Supplement  Tizanidine (Zanaflex)  Turmeric  Vitamin B  Vitamin D3 ==================================================================== For clinical consultation, please call [phone]. ====================================================================       ROS  Constitutional: Denies any fever or chills Gastrointestinal: No reported hemesis, hematochezia, vomiting, or acute GI distress Musculoskeletal:  Right shoulder pain, improved after surgery Neurological: No reported episodes of acute onset apraxia, aphasia, dysarthria, agnosia, amnesia, paralysis, loss of coordination, or loss of consciousness  Medication Review  HYDROcodone-acetaminophen, NON FORMULARY, amLODipine, fexofenadine, fluticasone, and tiZANidine  History Review  Allergy: Charles Mooney is allergic to sulfa antibiotics. Drug: Charles Mooney  reports that he does not currently use drugs. Alcohol:  reports current alcohol use of about 18.0 standard drinks per week. Tobacco:  reports that he quit smoking about 5 years  ago. His smoking use included cigarettes. He has never used smokeless tobacco. Social: Charles Mooney  reports that he quit smoking about 5 years ago. His smoking use included cigarettes. He has never used smokeless tobacco. He reports current alcohol use of about 18.0 standard drinks per week. He reports that he does not currently use drugs. Medical:  has a past medical history of Arthritis, Hepatitis, Hypertension, Subarachnoid hemorrhage ([place]) (2017), and Wears hearing aid in both ears. Surgical: Charles Mooney  has a past surgical history that includes Brain surgery; Joint replacement (Bilateral); Shoulder arthroscopy with rotator cuff repair and open biceps tenodesis (Left, 09/08/2018); and Hernia repair (1963). Family: family history is not on file.  Laboratory Chemistry Profile   Renal Lab Results  Component Value Date   BUN 18 09/04/2018   CREATININE 0.81 09/04/2018   GFRAA >60 09/04/2018   GFRNONAA >60 09/04/2018     Hepatic Lab Results  Component Value Date   AST 18 09/04/2018   ALT 20 09/04/2018   ALBUMIN 4.4 09/04/2018   ALKPHOS 39 09/04/2018     Electrolytes Lab Results  Component Value Date   NA 138 09/04/2018   K 3.9 09/04/2018   CL 105 09/04/2018   CALCIUM 9.2 09/04/2018     Bone No results found  for: VD25OH, [id number], [id number], [id number], 25OHVITD1, 25OHVITD2, 25OHVITD3, TESTOFREE, TESTOSTERONE   Inflammation (CRP: Acute Phase) (ESR: Chronic Phase) No results found for: CRP, ESRSEDRATE, LATICACIDVEN     Note: Above Lab results reviewed.  Recent Imaging Review  MR SHOULDER RIGHT WO CONTRAST CLINICAL DATA:  Right shoulder pain  EXAM: MRI OF THE RIGHT SHOULDER WITHOUT CONTRAST  TECHNIQUE: Multiplanar, multisequence MR imaging of the shoulder was performed. No intravenous contrast was administered.  COMPARISON:  None.  FINDINGS: Rotator cuff: There is a full-thickness, essentially full width tear of the supraspinatus tendon proximal to the footprint  at the level of the sub acromial space, with approximately 1.0 cm fluid gap. There is severe tendinosis of the infraspinatus tendon, with articular and bursal sided fraying. Teres minor is intact. Severe distal subscapularis tendinosis with intermediate grade articular sided tearing near the footprint and medialization of the long head biceps tendon at the top of the bicipital groove.  Muscles: Grade 4 muscle atrophy of the supraspinatus. No other significant muscle atrophy.  Biceps Long Head: Severe tendinosis of the intra-articular long head biceps tendon, with likely partial tearing, and medialization at the top of the bicipital groove.  Acromioclavicular Joint: Severe arthropathy of the acromioclavicular joint. Significant amount of subacromial/subdeltoid bursal fluid with synovitis, related to the cuff tear.  Glenohumeral Joint: Moderate size joint effusion with synovitis. There is fluid collecting along the subscapularis recess and subcoracoid bursa. Moderate chondrosis.  Labrum: Diffuse degenerative labral fraying with superior labral tearing at and anterior to the biceps labral anchor.  Bones: No fracture or dislocation. No aggressive osseous lesion. No additional findings.  Other: No fluid collection or hematoma.  IMPRESSION: Full-thickness, essentially full width tear of the supraspinatus tendon proximal to the footprint at the level of the subacromial space, with approximally 1.0 cm fluid gap. Grade 4 muscle atrophy of the supraspinatus.  Severe tendinosis of the infraspinatus tendon with articular and bursal sided fraying.  Severe distal subscapularis tendinosis with intermediate grade articular sided tearing at the footprint and mild medialization of the long head biceps tendon at the top of the bicipital groove. Severe tendinosis of the intra-articular LHBT with likely partial tearing.  Glenohumeral osteoarthritis with moderate chondrosis,  diffuse degenerative labral fraying, and degenerative superior labral tearing at and anterior to the biceps labral anchor.  Moderate-sized joint effusion with synovitis and subacromial-subdeltoid bursitis.  Severe AC joint arthropathy.  Electronically Signed   By: Maurine Simmering M.D.   On: 12/21/2020 09:40  Note: Reviewed        CLINICAL DATA:  Right shoulder pain and limited range of motion for 6 months. No known injury.   EXAM: MRI OF THE RIGHT SHOULDER WITHOUT CONTRAST   TECHNIQUE: Multiplanar, multisequence MR imaging of the shoulder was performed. No intravenous contrast was administered.   COMPARISON:  None.   FINDINGS: Rotator cuff: There is extensive heterogeneous increased T2 signal and thickening of the rotator cuff tendons consistent with severe tendinopathy. Tendinopathy is worst in supraspinatus where virtually no normal-appearing tendon is seen.   Muscles: Intermediate intensity edema is seen in the supraspinatus muscle belly and there is very mild atrophy of the supraspinatus.   Biceps long head: There is severe tendinopathy of both the intra and extra-articular segments, worse in the intra-articular segment.   Acromioclavicular Joint: Moderately severe degenerative change is present. Type 1 acromion. There is subacromial spurring. A large volume of fluid is seen in the subacromial/subdeltoid bursa   Glenohumeral Joint: Mild degenerative change is seen. Prominent fluid  is noted in the subscapularis recess.   Labrum: The superior labrum is degenerated but no tear is identified.   Bones:  No fracture or focal lesion.   Other: None.   IMPRESSION: Severe rotator cuff tendinopathy is worst in the supraspinatus where virtually no normal-appearing tendon is identified but no focal tear is seen. There is mild atrophy of the supraspinatus muscle belly.   Tendinopathy of the intra and extra-articular long head of biceps is severe in the intra-articular  segment.   Moderately severe acromioclavicular osteoarthritis. Subacromial spurring is noted.   Large volume of subacromial/subdeltoid fluid consistent with bursitis.     Electronically Signed   By: Inge Rise M.D.   On: 11/21/2017 09:08    Physical Exam  General appearance: Well nourished, well developed, and well hydrated. In no apparent acute distress Mental status: Alert, oriented x 3 (person, place, & time)       Respiratory: No evidence of acute respiratory distress Eyes: PERLA Vitals: BP (!) 146/99 (BP Location: Left Arm, Patient Position: Sitting, Cuff Size: Large)    Pulse 81    Temp 98.2 F (36.8 C) (Temporal)    Resp 16    Ht 6' (1.829 m)    Wt 234 lb 3.2 oz (106.2 kg)    SpO2 97%    BMI 31.76 kg/m  BMI: Estimated body mass index is 31.76 kg/m as calculated from the following:   Height as of this encounter: 6' (1.829 m).   Weight as of this encounter: 234 lb 3.2 oz (106.2 kg). Ideal: Ideal body weight: 77.6 kg (171 lb 1.2 oz) Adjusted ideal body weight: 89.1 kg (196 lb 5.2 oz)  Upper Extremity (UE) Exam    Side: Right upper extremity  Side: Left upper extremity  Skin & Extremity Inspection: Skin color, temperature, and hair growth are WNL. No peripheral edema or cyanosis. No masses, redness, swelling, asymmetry, or associated skin lesions. No contractures.  Skin & Extremity Inspection: Skin color, temperature, and hair growth are WNL. No peripheral edema or cyanosis. No masses, redness, swelling, asymmetry, or associated skin lesions. No contractures.  Functional ROM: Pain restricted ROM          Functional ROM: Unrestricted ROM          Muscle Tone/Strength: Functionally intact. No obvious neuro-muscular anomalies detected.  Muscle Tone/Strength: Functionally intact. No obvious neuro-muscular anomalies detected.  Sensory (Neurological): Unimpaired          Sensory (Neurological): Unimpaired          Palpation: No palpable anomalies              Palpation: No  palpable anomalies              Provocative Test(s):  Phalen's test: deferred Tinel's test: deferred Apley's scratch test (touch opposite shoulder):  Action 1 (Across chest): Decreased ROM Action 2 (Overhead): Decreased ROM Action 3 (LB reach): Decreased ROM   Provocative Test(s):  Phalen's test: deferred Tinel's test: deferred Apley's scratch test (touch opposite shoulder):  Action 1 (Across chest): deferred Action 2 (Overhead): deferred Action 3 (LB reach): deferred     Lumbar Spine Area Exam  Skin & Axial Inspection: No masses, redness, or swelling Alignment: Symmetrical Functional ROM: Unrestricted ROM       Stability: No instability detected Muscle Tone/Strength: Functionally intact. No obvious neuro-muscular anomalies detected. Sensory (Neurological): Musculoskeletal pain pattern pain with facet loading   5 out of 5 strength bilateral lower extremity: Plantar flexion, dorsiflexion, knee flexion,  knee extension.    Assessment   Diagnosis  1. Arthropathy of left wrist   2. Arthritis of right shoulder region   3. Arthritis of right glenohumeral joint   4. Lumbar facet arthropathy   5. Lumbar degenerative disc disease   6. Chronic pain syndrome        Plan of Care   Charles Mooney has a current medication list which includes the following long-term medication(s): amlodipine, fexofenadine, fluticasone, [START ON 03/14/2021] hydrocodone-acetaminophen, [START ON 04/13/2021] hydrocodone-acetaminophen, and [START ON 05/13/2021] hydrocodone-acetaminophen.   Pharmacotherapy (Medications Ordered): Meds ordered this encounter  Medications   HYDROcodone-acetaminophen (NORCO) 10-325 MG tablet    Sig: Take 1-2 tablets by mouth daily as needed. For chronic pain syndrome    Dispense:  60 tablet    Refill:  0   HYDROcodone-acetaminophen (NORCO) 10-325 MG tablet    Sig: Take 1-2 tablets by mouth daily as needed. For chronic pain syndrome    Dispense:  60 tablet    Refill:   0   HYDROcodone-acetaminophen (NORCO) 10-325 MG tablet    Sig: Take 1-2 tablets by mouth daily as needed. For chronic pain syndrome    Dispense:  60 tablet    Refill:  0   Patient encouraged to continue with physical therapy.  Follow-up plan:   Return in about 3 months (around 06/05/2021) for Medication Management, in person.     Status post bilateral L3, L4, L5, S1 facet  medial branch nerve blocks #1 on 06/01/2019 50% pain relief for 3 days, consider repeating in future or consider sprint peripheral nerve stimulation of medial branch at L4       Recent Visits Date Type Provider Dept  12/08/20 Office Visit Gillis Santa, MD [facility]  Showing recent visits within past 90 days and meeting all other requirements Today's Visits Date Type Provider Dept  03/07/21 Office Visit Gillis Santa, MD [facility]  Showing today's visits and meeting all other requirements Future Appointments Date Type Provider Dept  06/01/21 Appointment Gillis Santa, MD [facility]  Showing future appointments within next 90 days and meeting all other requirements  I discussed the assessment and treatment plan with the patient. The patient was provided an opportunity to ask questions and all were answered. The patient agreed with the plan and demonstrated an understanding of the instructions.  Patient advised to call back or seek an in-person evaluation if the symptoms or condition worsens.  Duration of encounter: 30 minutes.  Note by: Gillis Santa, MD Date: 03/07/2021; Time: 10:24 AM

## 2021-03-14 ENCOUNTER — Telehealth: Payer: Self-pay | Admitting: *Deleted

## 2021-03-14 NOTE — Telephone Encounter (Signed)
[facility] called in about Rx to fill today.  States he last filled on January 2 and should not be due to fill until March 22, 2021.  I have reviewed with BL and he states okay to fill today. Pharmacist aware.

## 2021-03-22 ENCOUNTER — Other Ambulatory Visit: Payer: Self-pay | Admitting: Student in an Organized Health Care Education/Training Program

## 2021-03-22 DIAGNOSIS — M5136 Other intervertebral disc degeneration, lumbar region: Secondary | ICD-10-CM

## 2021-03-22 DIAGNOSIS — G894 Chronic pain syndrome: Secondary | ICD-10-CM

## 2021-03-22 DIAGNOSIS — M47816 Spondylosis without myelopathy or radiculopathy, lumbar region: Secondary | ICD-10-CM

## 2021-05-12 ENCOUNTER — Other Ambulatory Visit: Payer: Self-pay | Admitting: Student in an Organized Health Care Education/Training Program

## 2021-05-12 DIAGNOSIS — M5136 Other intervertebral disc degeneration, lumbar region: Secondary | ICD-10-CM

## 2021-05-12 DIAGNOSIS — G894 Chronic pain syndrome: Secondary | ICD-10-CM

## 2021-05-12 DIAGNOSIS — M47816 Spondylosis without myelopathy or radiculopathy, lumbar region: Secondary | ICD-10-CM

## 2021-06-01 ENCOUNTER — Encounter: Payer: Medicare Other | Admitting: Student in an Organized Health Care Education/Training Program

## 2021-06-06 ENCOUNTER — Ambulatory Visit
Payer: Medicare Other | Attending: Student in an Organized Health Care Education/Training Program | Admitting: Student in an Organized Health Care Education/Training Program

## 2021-06-06 ENCOUNTER — Encounter: Payer: Self-pay | Admitting: Student in an Organized Health Care Education/Training Program

## 2021-06-06 VITALS — BP 132/90 | HR 81 | Temp 97.1°F | Resp 16 | Ht 72.0 in | Wt 230.0 lb

## 2021-06-06 DIAGNOSIS — M5136 Other intervertebral disc degeneration, lumbar region: Secondary | ICD-10-CM | POA: Insufficient documentation

## 2021-06-06 DIAGNOSIS — M47816 Spondylosis without myelopathy or radiculopathy, lumbar region: Secondary | ICD-10-CM | POA: Diagnosis present

## 2021-06-06 DIAGNOSIS — M19011 Primary osteoarthritis, right shoulder: Secondary | ICD-10-CM | POA: Diagnosis present

## 2021-06-06 DIAGNOSIS — M19012 Primary osteoarthritis, left shoulder: Secondary | ICD-10-CM | POA: Diagnosis present

## 2021-06-06 DIAGNOSIS — G894 Chronic pain syndrome: Secondary | ICD-10-CM | POA: Insufficient documentation

## 2021-06-06 MED ORDER — HYDROCODONE-ACETAMINOPHEN 10-325 MG PO TABS: 1.0000 | ORAL_TABLET | Freq: Every day | ORAL | 0 refills | Status: DC | PRN

## 2021-06-06 NOTE — Progress Notes (Signed)
PROVIDER NOTE: Information contained herein reflects review and annotations entered in association with encounter. Interpretation of such information and data should be left to medically-trained personnel. Information provided to patient can be located elsewhere in the medical record under "Patient Instructions". Document created using STT-dictation technology, any transcriptional errors that may result from process are unintentional.  ?  ?Patient: Charles Mooney  Service Category: E/M  Provider: Gillis Santa, MD  ?DOB: December 20, 1955  DOS: 06/06/2021  Specialty: Interventional Pain Management  ?MRN: [id number]  Setting: Ambulatory outpatient  PCP: Gladstone Lighter, MD  ?Type: Established Patient    Referring Provider: Gladstone Lighter, MD  ?Location: Office  Delivery: Face-to-face    ? ?HPI  ?[name], a 66 y.o. year old male, is here today because of his Lumbar facet arthropathy [M47.816]. [name]'s primary complain today is Back Pain (lower) and Foot Pain (Left heel) ? ?Last encounter: My last encounter with him was on 06/07/2020. ?Pertinent problems: Mr. Pedley has Chronic bilateral low back pain with bilateral sciatica; Acute bursitis of right shoulder; Nonallopathic lesion of sacral region; Lumbar facet arthropathy; Lumbar degenerative disc disease; Localized osteoarthritis of shoulder regions, bilateral; Encounter for long-term opiate analgesic use; Chronic SI joint pain; and Chronic pain syndrome on their pertinent problem list. ?Pain Assessment: Severity of Chronic pain is reported as a 6 /10. Location: Back Lower/left leg to the knee. Onset: More than a month ago. Quality: Dull, Spasm, Sharp. Timing: Intermittent. Modifying factor(s): stretching, walking,Hydrocodone. ?Vitals:  height is 6' (1.829 m) and weight is 230 lb (104.3 kg). His temporal temperature is 97.1 ?F (36.2 ?C) (abnormal). His blood pressure is 132/90 and his pulse is 81. His respiration is 16 and oxygen saturation is 97%.   ? ?Reason for encounter: medication management.   ? ?[name] presents today for medication management and endorsement of increased lower back pain.  He also has left heel pain. ?His low back pain is related to lumbar facet arthropathy and lumbar degenerative disc disease.  I will place a referral to physical therapy as he has not done physical therapy for his lower back in many years. ?He states that his right shoulder is doing much better. ?We will also renew his annual urine toxicology screen today for medication compliance and monitoring. ? ?Pharmacotherapy Assessment  ?Analgesic: Hydrocodone 10 mg BID prn  ? ?Monitoring: ?[place] PMP: PDMP reviewed during this encounter.       ?Pharmacotherapy: No side-effects or adverse reactions reported. ?Compliance: No problems identified. ?Effectiveness: Clinically acceptable.  UDS:  ?Summary  ?Date Value Ref Range Status  ?06/07/2020 Note  Final  ?  Comment:  ?  ==================================================================== ?Tox[employer] Select 13 (MW) ?==================================================================== ?Specimen Alert ?Note: Urinary creatinine is low; ability to detect some drugs may be ?compromised. Interpret results with caution. (Creatinine) ?==================================================================== ?Test                             Result       Flag       Units ? ?Drug Present and Declared for Prescription Verification ?  Norhydrocodone                 611          EXPECTED   ng/mg creat ?   Norhydrocodone is an expected metabolite of hydrocodone. ? ?Drug Absent but Declared for Prescription Verification ?  Hydrocodone  Not Detected UNEXPECTED ng/mg creat ?   Hydrocodone is almost always present in patients taking this drug ?   consistently. Absence of hydrocodone could be due to lapse of time ?   since the last dose or unusual pharmacokinetics (rapid  metabolism). ? ?==================================================================== ?Test                      Result    Flag   Units      Ref Range ?  Creatinine              18        LL     mg/dL      >=20 ?==================================================================== ?Declared Medications: ? The flagging and interpretation on this report are based on the ? following declared medications.  Unexpected results may arise from ? inaccuracies in the declared medications. ? ? **Note: The testing scope of this panel includes these medications: ? ? Hydrocodone (Norco) ? ? **Note: The testing scope of this panel does not include the ? following reported medications: ? ? Acetaminophen (Norco) ? Amlodipine (Norvasc) ? Fexofenadine (Allegra) ? Fluticasone (Flonase) ? Meloxicam (Mobic) ? Supplement ? Tizanidine (Zanaflex) ? Turmeric ? Vitamin B ? Vitamin D3 ?==================================================================== ?For clinical consultation, please call [phone]. ?==================================================================== ?  ?  ? ? ?ROS  ?Constitutional: Denies any fever or chills ?Gastrointestinal: No reported hemesis, hematochezia, vomiting, or acute GI distress ?Musculoskeletal:  Low back pain, left heel pain ?Neurological: No reported episodes of acute onset apraxia, aphasia, dysarthria, agnosia, amnesia, paralysis, loss of coordination, or loss of consciousness ? ?Medication Review  ?HYDROcodone-acetaminophen, NON FORMULARY, amLODipine, clindamycin, fexofenadine, fluticasone, tiZANidine, and triamcinolone ointment ? ?History Review  ?Allergy: Charles Mooney is allergic to sulfa antibiotics. ?Drug: Charles Mooney  reports that he does not currently use drugs. ?Alcohol:  reports current alcohol use of about 18.0 standard drinks per week. ?Tobacco:  reports that he quit smoking about 5 years ago. His smoking use included cigarettes. He has never used smokeless tobacco. ?Social: Charles Mooney   reports that he quit smoking about 5 years ago. His smoking use included cigarettes. He has never used smokeless tobacco. He reports current alcohol use of about 18.0 standard drinks per week. He reports that he does not currently use drugs. ?Medical:  has a past medical history of Arthritis, Hepatitis, Hypertension, Subarachnoid hemorrhage ([place]) (2017), and Wears hearing aid in both ears. ?Surgical: Charles Mooney  has a past surgical history that includes Brain surgery; Joint replacement (Bilateral); Shoulder arthroscopy with rotator cuff repair and open biceps tenodesis (Left, 09/08/2018); and Hernia repair (1963). ?Family: family history is not on file. ? ?Laboratory Chemistry Profile  ? ?Renal ?Lab Results  ?Component Value Date  ? BUN 18 09/04/2018  ? CREATININE 0.81 09/04/2018  ? GFRAA >60 09/04/2018  ? GFRNONAA >60 09/04/2018  ? ?  Hepatic ?Lab Results  ?Component Value Date  ? AST 18 09/04/2018  ? ALT 20 09/04/2018  ? ALBUMIN 4.4 09/04/2018  ? ALKPHOS 39 09/04/2018  ? ?  ?Electrolytes ?Lab Results  ?Component Value Date  ? NA 138 09/04/2018  ? K 3.9 09/04/2018  ? CL 105 09/04/2018  ? CALCIUM 9.2 09/04/2018  ? ?  Bone ?No results found for: [place], [id number], [id number], [id number], 25OHVITD1, 25OHVITD2, 25OHVITD3, TESTOFREE, TESTOSTERONE ?  ?Inflammation (CRP: Acute Phase) (ESR: Chronic Phase) ?No results found for: CRP, ESRSEDRATE, LATICACIDVEN ?    ?Note: Above Lab results reviewed. ? ?Recent Imaging Review  ?MR SHOULDER  RIGHT WO CONTRAST ?CLINICAL DATA:  Right shoulder pain ? ?EXAM: ?MRI OF THE RIGHT SHOULDER WITHOUT CONTRAST ? ?TECHNIQUE: ?Multiplanar, multisequence MR imaging of the shoulder was performed. ?No intravenous contrast was administered. ? ?COMPARISON:  None. ? ?FINDINGS: ?Rotator cuff: There is a full-thickness, essentially full width tear ?of the supraspinatus tendon proximal to the footprint at the level ?of the sub acromial space, with approximately 1.0 cm fluid gap. ?There is severe  tendinosis of the infraspinatus tendon, with ?articular and bursal sided fraying. Teres minor is intact. Severe ?distal subscapularis tendinosis with intermediate grade articular ?sided tearing near the footprint and medialization of the long head ?biceps tendon at the top of the bicipital

## 2021-06-06 NOTE — Progress Notes (Signed)
Nursing Pain Medication Assessment:  ?Safety precautions to be maintained throughout the outpatient stay will include: orient to surroundings, keep bed in low position, maintain call bell within reach at all times, provide assistance with transfer out of bed and ambulation.  ?Medication Inspection Compliance: Pill count conducted under aseptic conditions, in front of the patient. Neither the pills nor the bottle was removed from the patient's sight at any time. Once count was completed pills were immediately returned to the patient in their original bottle. ? ?Medication: Hydrocodone/APAP ?Pill/Patch Count:  9 of 60 pills remain ?Pill/Patch Appearance: Markings consistent with prescribed medication ?Bottle Appearance: Standard pharmacy container. Clearly labeled. ?Filled Date: 03 / 25 / 2023 ?Last Medication intake:  Today ?

## 2021-06-11 LAB — TOXASSURE SELECT 13 (MW), URINE

## 2021-06-12 ENCOUNTER — Other Ambulatory Visit: Payer: Self-pay | Admitting: Student in an Organized Health Care Education/Training Program

## 2021-06-12 DIAGNOSIS — M47816 Spondylosis without myelopathy or radiculopathy, lumbar region: Secondary | ICD-10-CM

## 2021-06-12 DIAGNOSIS — G894 Chronic pain syndrome: Secondary | ICD-10-CM

## 2021-06-12 DIAGNOSIS — M5136 Other intervertebral disc degeneration, lumbar region: Secondary | ICD-10-CM

## 2021-06-20 NOTE — Therapy (Signed)
?OUTPATIENT PHYSICAL THERAPY  EVALUATION ? ? ?Patient Name: Charles Mooney ?MRN: [id number] ?DOB:03-05-1955, 66 y.o., male ?Today's Date: 06/21/2021 ? ? PT End of Session - 06/21/21 1144   ? ? Visit Number 1   ? Number of Visits 24   ? Date for PT Re-Evaluation 09/13/21   ? Authorization Type MEDICARE PART B reporting period from 06/21/2021   ? Progress Note Due on Visit 10   ? PT Start Time 1035   ? PT Stop Time 1115   ? PT Time Calculation (min) 40 min   ? Activity Tolerance Patient tolerated treatment well   ? Behavior During Therapy [facility] for tasks assessed/performed   ? ?  ?  ? ?  ? ? ?Past Medical History:  ?Diagnosis Date  ? Arthritis   ? Hepatitis   ? c 2000  ? Hypertension   ? Subarachnoid hemorrhage ([place]) 2017  ? Wears hearing aid in both ears   ? ?Past Surgical History:  ?Procedure Laterality Date  ? BRAIN SURGERY    ? [facility] COILING 2017  ? [place]  ? double hernia repair  ? JOINT REPLACEMENT Bilateral   ? TKR  L-2012, R-2015  ? SHOULDER ARTHROSCOPY WITH ROTATOR CUFF REPAIR AND OPEN BICEPS TENODESIS Left 09/08/2018  ? Procedure: LEFT SHOULDER ARTHROSCOPY,SUBSACAPULARIS REPAIR, SUBACROMIAL DECOMP, DISTAL CLAVICLE EXCISION,BICEP TENODESIS, MINI OPEN REGENTEN PATCH APPLICATION;  Surgeon: Leim Fabry, MD;  Location: [facility] ORS;  Service: Orthopedics;  Laterality: Left;  ? ?Patient Active Problem List  ? Diagnosis Date Noted  ? Arthropathy of left wrist 10/12/2020  ? Viral hepatitis C 11/02/2019  ? Lumbar facet arthropathy 04/27/2019  ? Lumbar degenerative disc disease 04/27/2019  ? Localized osteoarthritis of shoulder regions, bilateral 04/27/2019  ? Encounter for long-term opiate analgesic use 04/27/2019  ? Chronic SI joint pain 04/27/2019  ? Chronic pain syndrome 04/27/2019  ? Pain in joint of left shoulder 06/03/2018  ? Abnormal gait 06/18/2017  ? Anatomical narrow angle 06/18/2017  ? Derangement of posterior horn of medial meniscus 06/18/2017  ? Edema 06/18/2017  ? Hip pain 06/18/2017  ? History of  total knee arthroplasty 06/18/2017  ? Localized, primary osteoarthritis 06/18/2017  ? Muscle weakness 06/18/2017  ? 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 Acute bursitis of right shoulder 06/14/2017  ? Nonallopathic lesion of sacral region 06/14/2017  ? Nonallopathic lesion of thoracic region 06/14/2017  ? Nonallopathic lesion of lumbosacral region 06/14/2017  ? Biomechanical lesion 06/14/2017  ? Chronic bilateral low back pain with bilateral sciatica 05/23/2017  ? Arthritis 04/19/2013  ? Cataract nuclear 06/16/2012  ? Glaucoma suspect of both eyes 06/16/2012  ? ? ?PCP: Gladstone Lighter, MD ? ?REFERRING PROVIDER: Gillis Santa, MD ? ?REFERRING DIAG: lumbar facet arthropathy, lumbar degenerative disc disease, chronic pain syndrome ? ?THERAPY DIAG:  ?Other low back pain ? ?Radiculopathy, thoracolumbar region ? ?Pain in left foot ? ?ONSET DATE: 1979 ? ?SUBJECTIVE:                                                                                                                                                                                          ? ?  SUBJECTIVE STATEMENT: ?Patient reports his back problem started in 1979 when he had his first acute episode that lasted about 6 months. It was a lifting injury.  He got into a sort of good habit of stretching and exercise and kept it at [place] until about the late 80s. It has been episodic since then. He had a stroke in 2017 and spending time in the hospital aggravated his back but he recovered. The stroke was a subarachnoid hehorrage so it didn't affect any motor skills or any particular side. It mainly caused some expressive aphasia that still bothers him some. About 2 years ago he had his first shoulder surgery (left shoulder). When he laid out because of that his back started to act up again. After the shoulder surgery he started being active again. Then the day before he had the right shoulder surgery (the week before Thanksgiving 2022) his back started to hurt but he  could not do anything about it because of the shoulder surgery. In December he was able to start doing stretches again and it got better. He has reached a point where things have stopped getting better. He has constant cramping in his lower left back and was causing tingling and numbness down anterior left leg and that is better now but it is still  causing pain in left heel that is intermittent but almost constant.  He thinks it could be from something else but pain in both heels has been associated with low back pain in the past. He would like to get a new routine for stretching and exercises for his back and see if he can settle his pain down.  He has no symptoms on his right leg currently. He also thinks his pain may be related to gaining about 15 lbs with his shoulder surgery. He is still somewhat active. He and his [place] a lot at [place] but he stopped doing that about 3 weeks ago, he thinks it is because of his heel. He went yesterday but his back hurt and he got out of breath. Heel can hurt without back hurting.  ? ?PERTINENT HISTORY:  ?Patient is a 66 y.o. male who presents to outpatient physical therapy with a referral for medical diagnosis  lumbar facet arthropathy, lumbar degenerative disc disease, chronic pain syndrome. This patient's chief complaints consist of chronic left sided low back pain, history of left anterior thigh paresthesia, and left medial heel pain leading to the following functional deficits: limits him everywhere to some amount, working on projects, walking for exercise, sleeping, doing as much as he wants to, prolonged sitting, prolonged walking, lifting, bending. Relevant past medical history and comorbidities include bilateral shoulder surgery (RTC repair, R most recent 12/2020), bilateral TKA, subarachnoid hemorrhage 2017 with [facility] coiling, HTN, hx of hepatitis C (cured), double hernia repair, plantar facial fibromatosis, chronic pain syndrome, former smoker.  Patient  denies hx of cancer, seizures, lung problems, heart problems, diabetes, unexplained weight loss, unexplained changes in bowel or bladder problems, unexplained stumbling or dropping things, osteoporosis, and spinal surgery ? ? ?PAIN:  ?Are you having pain? Yes: NPRS scale: Current: 6/10,  Best: 2/10, Worst: 7/10. ?Pain location: left low back, used to go to left anterior thigh paresthesia, is almost constant pain at left medial heel.  ?Pain description: constant cramping/spasm in the low back, grabbing squeezing. Heel feels like if someone took a ballpine hammer and smacked hammer.  ?Aggravating factors: sitting, standing still, prolonged walking, bending ?Relieving factors: hydrocodone, Zanaflex (  helps him sleep), TENS, stretching, heat/ice.  ? ?FUNCTIONAL LIMITATIONS: limits him everywhere to some amount, working on projects, walking for exercise, sleeping, doing as much as he wants to, prolonged sitting, prolonged walking, lifting, bending.  ? ?PRECAUTIONS: None, recent R shoulder surgery ? ?WEIGHT BEARING RESTRICTIONS No ? ?FALLS:  ?Has patient fallen in last 6 months? No ? ?LIVING ENVIRONMENT: ?No concerns about getting around home safely ? 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  ? ?LEISURE: fix things, work in the shop ? ?PLOF: Independent ? ?PATIENT GOALS He would like to get a new routine for stretching and exercises for his back and see if he can settle his pain down. ? ?IMAGING:  ?lumbar spine with/bend xray report 04/27/2019: "IMPRESSION: ?Osteoarthritic change at L3-4, L4-5, L5-S1, most severe at L5-S1. No ?fracture. No spondylolisthesis. No change in lateral alignment ?between neutral lateral, flexion lateral, and extension lateral ?imaging." ? ? ?OBJECTIVE ? ?SELF- REPORTED FUNCTION ?FOTO score: 51/100 (lumbar spine questionnaire) ? ?OBSERVATION/INSPECTION ?Posture ?Posture (standing): mildly  decreased lumbar lordosis ?Anthropometrics ?Tremor: none ?Body composition: BMI: 31.2 ?Muscle bulk: WFL ?Functional Mobility ?Bed mobility: supine <> sit and rolling I ?Transfers: sit <> Stand I ?Gait: grossly

## 2021-06-21 ENCOUNTER — Ambulatory Visit
Payer: Medicare Other | Attending: Student in an Organized Health Care Education/Training Program | Admitting: Physical Therapy

## 2021-06-21 ENCOUNTER — Encounter: Payer: Self-pay | Admitting: Physical Therapy

## 2021-06-21 DIAGNOSIS — M5415 Radiculopathy, thoracolumbar region: Secondary | ICD-10-CM

## 2021-06-21 DIAGNOSIS — M79672 Pain in left foot: Secondary | ICD-10-CM

## 2021-06-21 DIAGNOSIS — M5459 Other low back pain: Secondary | ICD-10-CM | POA: Diagnosis present

## 2021-06-22 NOTE — Therapy (Signed)
?OUTPATIENT PHYSICAL THERAPY TREATMENT NOTE ? ? ?Patient Name: Charles Mooney ?MRN: [id number] ?DOB:08/24/1955, 66 y.o., male ?Today's Date: 06/26/2021 ? ?PCP: Enid BaasKalisetti, Radhika, MD ?REFERRING PROVIDER: Edward JollyLateef, Bilal, MD ? ?END OF SESSION:  ? PT End of Session - 06/26/21 0913   ? ? Visit Number 2   ? Number of Visits 24   ? Date for PT Re-Evaluation 09/13/21   ? Authorization Type MEDICARE PART B reporting period from 06/21/2021   ? Progress Note Due on Visit 10   ? PT Start Time [phone]   ? PT Stop Time 0955   ? PT Time Calculation (min) 51 min   ? Activity Tolerance Patient tolerated treatment well   ? Behavior [facility] for tasks assessed/performed   ? ?  ?  ? ?  ? ? ?Past Medical History:  ?Diagnosis Date  ? Arthritis   ? Hepatitis   ? c 2000  ? Hypertension   ? Subarachnoid hemorrhage (HCC) 2017  ? Wears hearing aid in both ears   ? ?Past Surgical History:  ?Procedure Laterality Date  ? [facility]    [facility]  ? HERNIA REPAIR  1963  ? double hernia repair  ? JOINT REPLACEMENT Bilateral   ? TKR  L-2012, R-2015  ? SHOULDER ARTHROSCOPY WITH ROTATOR CUFF REPAIR AND OPEN BICEPS TENODESIS Left 09/08/2018  ? Procedure: LEFT SHOULDER ARTHROSCOPY,SUBSACAPULARIS REPAIR, SUBACROMIAL DECOMP, DISTAL CLAVICLE EXCISION,BICEP TENODESIS, MINI OPEN REGENTEN PATCH APPLICATION;  Surgeon: Signa KellPatel, Sunny, MD;  Location: [facility] ORS;  Service: Orthopedics;  Laterality: Left;  ? ?Patient Active Problem List  ? Diagnosis Date Noted  ? Arthropathy of left wrist 10/12/2020  ? Viral hepatitis C 11/02/2019  ? Lumbar facet arthropathy 04/27/2019  ? Lumbar degenerative disc disease 04/27/2019  ? Localized osteoarthritis of shoulder regions, bilateral 04/27/2019  ? Encounter for long-term opiate analgesic use 04/27/2019  ? Chronic SI joint pain 04/27/2019  ? Chronic pain syndrome 04/27/2019  ? Pain in joint of left shoulder 06/03/2018  ? Abnormal gait 06/18/2017  ? Anatomical narrow angle 06/18/2017  ? Derangement of posterior horn  of medial meniscus 06/18/2017  ? Edema 06/18/2017  ? Hip pain 06/18/2017  ? History of total knee arthroplasty 06/18/2017  ? Localized, primary osteoarthritis 06/18/2017  ? Muscle weakness 06/18/2017  ? 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 Acute bursitis of right shoulder 06/14/2017  ? Nonallopathic lesion of sacral region 06/14/2017  ? Nonallopathic lesion of thoracic region 06/14/2017  ? Nonallopathic lesion of lumbosacral region 06/14/2017  ? Biomechanical lesion 06/14/2017  ? Chronic bilateral low back pain with bilateral sciatica 05/23/2017  ? Arthritis 04/19/2013  ? Cataract nuclear 06/16/2012  ? Glaucoma suspect of both eyes 06/16/2012  ? ? ?REFERRING DIAG:  lumbar facet arthropathy, lumbar degenerative disc disease, chronic pain syndrome ? ?THERAPY DIAG:  ?Other low back pain ? ?Radiculopathy, thoracolumbar region ? ?Pain in left foot ? ?ONSET DATE: 1979 ? ?PERTINENT HISTORY: Patient is a 66 y.o. male who presents to outpatient physical therapy with a referral for medical diagnosis  lumbar facet arthropathy, lumbar degenerative disc disease, chronic pain syndrome. This patient's chief complaints consist of chronic left sided low back pain, history of left anterior thigh paresthesia, and left medial heel pain leading to the following functional deficits: limits him everywhere to some amount, working on projects, walking for exercise, sleeping, doing as much as he wants to, prolonged sitting, prolonged walking, lifting, bending. Relevant past medical history and comorbidities include bilateral shoulder surgery (  RTC repair, R most recent 12/2020), bilateral TKA, subarachnoid hemorrhage 2017 with SAH coiling, HTN, hx of hepatitis C (cured), double hernia repair, plantar facial fibromatosis, chronic pain syndrome, former smoker.  Patient denies hx of cancer, seizures, lung problems, heart problems, diabetes, unexplained weight loss, unexplained changes in bowel or bladder problems, unexplained stumbling or  dropping things, osteoporosis, and spinal surgery ? ?PRECAUTIONS: None, recent R shoulder surgery ? ?SUBJECTIVE: Patient reports his back has been about the same since his initial eval. He was no worse after initial evaluation. He did a lot of walking on a garden tour this weekend. He has noticed his heel pain is not as noticeable while he is active but really starts to ache when he sits down or rests after. He states his heel pain does not bother him when he first gets up. He states his history of Plantar fascial fibromatosis in his chart actually seemed to be from his back as back stretching improved it but two injections to the foot had no effect in the past. He states he changed shoes in the past and that maybe helped a little but mostly it was helped by his back stretches. Most of his current low back exercises are stretching.  ? ?PAIN:  ?Are you having pain? Yes: NPRS scale: 4/10 left low back and left heel.  ? ? ?OBJECTIVE:  ? ?TODAY'S TREATMENT  ?Therapeutic exercise: to centralize symptoms and improve ROM, strength, muscular endurance, and activity tolerance required for successful completion of functional activities.  ?- NuStep level 3 using bilateral upper and lower extremities. Seat/handle setting 13/14. For improved extremity mobility, muscular endurance, and activity tolerance; and to induce the analgesic effect of aerobic exercise, stimulate improved joint nutrition, and prepare body structures and systems for following interventions. x 6  minutes. Average SPM = 94. ?- seated lumbar flexion with clear theraball roll out, self selected pace over 2 min. (Improved foot pain temporarily, returned gradually with walking/standing). ?- prone press up, 2x10 (increasing left lateral leg "buzzing" that went away upon standing, foot still hurts a little). ?- seated lumbar flexion with diagonals with clear theraball roll out, self selected pace over 2 min. (Improved foot pain slightly). ?- hooklying posterior  pelvic tilt (PPT) with alternating LE extension, 1x10 each side.  ?- Hooklying modified dead bug (hooklying with shoulders flexed to 90 starting position then alternating hip/knee extension and contralateral shoulder flexion with abdominal activation). 2x10 each side.  ?- Standing pallof press (multifidus press) with black theraband 1x15 each side. Cuing for slight PPT.  ?- hooklying bridge with hip abduction against blackTB around knees, 1x10 with 2-3 reps of hip abduction while holding bridge.  ?- Education on HEP including handout  ? ?Pt required multimodal cuing for proper technique and to facilitate improved neuromuscular control, strength, range of motion, and functional ability resulting in improved performance and form. ?  ?  ?PATIENT EDUCATION:  ?Education details: Exercise purpose/form. Self management techniques. HEP with handout ?Person educated: Patient ?Education method: Explanation, Demonstration, tactile cues and Verbal cues ?Education comprehension: verbalized understanding, returned demonstration, verbal cues required, and needs further education ?  ?  ?HOME EXERCISE PROGRAM: ?Access Code: [email or web address] ?URL: [email or web address] ?Date: 06/26/2021 ?Prepared by: Norton Blizzard ? ?Exercises ?- Seated Flexion Stretch with Swiss Ball  - 1 x daily - 1 sets - 20 reps - 5 seconds hold ?- Supine Dead Bug with Leg Extension  - 3-5 x weekly - 3 sets - 10 reps ?- Seated Anti-Rotation Press with Anchored Resistance  -  3-5 x weekly - 3 sets - 15-20 reps - 5 seconds hold ?- Bridge with Hip Abduction and Resistance  - 3-5 x weekly - 10-20 sets - 3 reps ?  ?ASSESSMENT: ?  ?CLINICAL IMPRESSION: ?Patient tolerated treatment well with no worsening of pain by end of session. Patient reported mild improvement in L foot pain with flexion exercises and temporary onset of left lateral thigh paresthesia with lumbar extension exercises. Session included assessment for directional preference and introducing core  strengthening exercises. Patient demonstrates good lumbopelvic control and ability to follow cuing but reports fatigue with core exercises suggesting he would bneefit from regular core training. Updated HEP t

## 2021-06-26 ENCOUNTER — Encounter: Payer: Self-pay | Admitting: Physical Therapy

## 2021-06-26 ENCOUNTER — Ambulatory Visit: Payer: Medicare Other | Admitting: Physical Therapy

## 2021-06-26 DIAGNOSIS — M5459 Other low back pain: Secondary | ICD-10-CM | POA: Diagnosis not present

## 2021-06-26 DIAGNOSIS — M79672 Pain in left foot: Secondary | ICD-10-CM

## 2021-06-26 DIAGNOSIS — M5415 Radiculopathy, thoracolumbar region: Secondary | ICD-10-CM

## 2021-06-28 ENCOUNTER — Ambulatory Visit: Payer: Medicare Other | Admitting: Physical Therapy

## 2021-06-28 NOTE — Therapy (Signed)
?OUTPATIENT PHYSICAL THERAPY TREATMENT NOTE ? ? ?Patient Name: Charles Mooney ?MRN: [id number] ?DOB:11-11-1955, 66 y.o., male ?Today's Date: 06/29/2021 ? ?PCP: Enid Baas, MD ?REFERRING PROVIDER: Edward Jolly, MD ? ?END OF SESSION:  ? PT End of Session - 06/29/21 0951   ? ? Visit Number 3   ? Number of Visits 24   ? Date for PT Re-Evaluation 09/13/21   ? Authorization Type MEDICARE PART B reporting period from 06/21/2021   ? Progress Note Due on Visit 10   ? PT Start Time [phone]   ? PT Stop Time 0945   ? PT Time Calculation (min) 42 min   ? Activity Tolerance Patient tolerated treatment well   ? Behavior During Therapy [facility] for tasks assessed/performed   ? ?  ?  ? ?  ? ? ? ?Past Medical History:  ?Diagnosis Date  ? Arthritis   ? Hepatitis   ? c 2000  ? Hypertension   ? Subarachnoid hemorrhage (HCC) 2017  ? Wears hearing aid in both ears   ? ?Past Surgical History:  ?Procedure Laterality Date  ? BRAIN SURGERY    ? [facility]IL[facility] 2017  ? HERNIA REPAIR  1963  ? double hernia repair  ? JOINT REPLACEMENT Bilateral   ? TKR  L-2012, R-2015  ? SHOULDER ARTHROSCOPY WITH ROTATOR CUFF REPAIR AND OPEN BICEPS TENODESIS Left 09/08/2018  ? Procedure: LEFT SHOULDER ARTHROSCOPY,SUBSACAPULARIS REPAIR, SUBACROMIAL DECOMP, DISTAL CLAVICLE EXCISION,BICEP TENODESIS, MINI OPEN REGENTEN PATCH APPLICATION;  Surgeon: Signa Kell, MD;  Location: [facility] ORS;  Service: Orthopedics;  Laterality: Left;  ? ?Patient Active Problem List  ? Diagnosis Date Noted  ? Arthropathy of left wrist 10/12/2020  ? Viral hepatitis C 11/02/2019  ? Lumbar facet arthropathy 04/27/2019  ? Lumbar degenerative disc disease 04/27/2019  ? Localized osteoarthritis of shoulder regions, bilateral 04/27/2019  ? Encounter for long-term opiate analgesic use 04/27/2019  ? Chronic SI joint pain 04/27/2019  ? Chronic pain syndrome 04/27/2019  ? Pain in joint of left shoulder 06/03/2018  ? Abnormal gait 06/18/2017  ? Anatomical narrow angle 06/18/2017  ? Derangement of posterior  horn of medial meniscus 06/18/2017  ? Edema 06/18/2017  ? Hip pain 06/18/2017  ? History of total knee arthroplasty 06/18/2017  ? Localized, primary osteoarthritis 06/18/2017  ? Muscle weakness 06/18/2017  ? 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 Acute bursitis of right shoulder 06/14/2017  ? Nonallopathic lesion of sacral region 06/14/2017  ? Nonallopathic lesion of thoracic region 06/14/2017  ? Nonallopathic lesion of lumbosacral region 06/14/2017  ? Biomechanical lesion 06/14/2017  ? Chronic bilateral low back pain with bilateral sciatica 05/23/2017  ? Arthritis 04/19/2013  ? Cataract nuclear 06/16/2012  ? Glaucoma suspect of both eyes 06/16/2012  ? ? ?REFERRING DIAG:  lumbar facet arthropathy, lumbar degenerative disc disease, chronic pain syndrome ? ?THERAPY DIAG:  ?Other low back pain ? ?Radiculopathy, thoracolumbar region ? ?Pain in left foot ? ?ONSET DATE: 1979 ? ?PERTINENT HISTORY: Patient is a 66 y.o. male who presents to outpatient physical therapy with a referral for medical diagnosis  lumbar facet arthropathy, lumbar degenerative disc disease, chronic pain syndrome. This patient's chief complaints consist of chronic left sided low back pain, history of left anterior thigh paresthesia, and left medial heel pain leading to the following functional deficits: limits him everywhere to some amount, working on projects, walking for exercise, sleeping, doing as much as he wants to, prolonged sitting, prolonged walking, lifting, bending. Relevant past medical history and comorbidities include bilateral shoulder  surgery (RTC repair, R most recent 12/2020), bilateral TKA, subarachnoid hemorrhage 2017 with SAH coiling, HTN, hx of hepatitis C (cured), double hernia repair, plantar facial fibromatosis, chronic pain syndrome, former smoker.  Patient denies hx of cancer, seizures, lung problems, heart problems, diabetes, unexplained weight loss, unexplained changes in bowel or bladder problems, unexplained  stumbling or dropping things, osteoporosis, and spinal surgery. ? ?PRECAUTIONS: None, recent R shoulder surgery ? ?SUBJECTIVE: Patient states his back has been very crampy and spasming over the last couple of days and he has not seemed to be able to "get out of it." He is also seeing a chiropractor who he has been seeing a couple of years and his back pain got worse after his last visit with the chiropractor on Wednesday, which is unusual. He reports chiropractor does HVLA thrust, the "thumper" but not needles, estim, or prescribe exercises. He feels the concerted effort between chiro and PT has changed things in his back a bit and the muscles haven't gotten used to it yet. HEP is going well.  ? ?PAIN:  ?Are you having pain? Yes: NPRS scale: 6/10 left low back and 4/10 left heel.  ? ?OBJECTIVE:  ? ?TODAY'S TREATMENT  ?Therapeutic exercise: to centralize symptoms and improve ROM, strength, muscular endurance, and activity tolerance required for successful completion of functional activities.  ?- NuStep level 6 using bilateral upper and lower extremities. Seat/handle setting 14/14. For improved extremity mobility, muscular endurance, and activity tolerance; and to induce the analgesic effect of aerobic exercise, stimulate improved joint nutrition, and prepare body structures and systems for following interventions. x 5:30  minutes. Average SPM = 102 ?- seated lumbar flexion with diagonals with clear theraball roll out, self selected pace over 2 min. (Improved foot pain slightly). ?(Manual therapy/dry needling - see below).  ?- Standing pallof press (multifidus press) with black theraband 2x15 each side. Cuing for slight PPT.  ? ?Manual therapy: to reduce pain and tissue tension, improve range of motion, neuromodulation, in order to promote improved ability to complete functional activities. ?PRONE ?- STM to bilateral lumbar paraspinals, L > R ? ?Modality: (unbilled) ?Dry needling performed to lumbar spine to decrease  pain and spasms along patient?s low back and L foot region with patient in prone utilizing 3 dry needle(s) .30mm x 75mm with 5 sticks total at bilateral multifidi at approx L2-L3 levels. Patient educated about the risks and benefits from therapy and verbally consents to treatment.  ?Dry needling performed by Luretha MurphySara R. Ilsa IhaSnyder PT, DPT who is certified in this technique. ? ?Pt required multimodal cuing for proper technique and to facilitate improved neuromuscular control, strength, range of motion, and functional ability resulting in improved performance and form. ?  ?  ?PATIENT EDUCATION:  ?Education details: Exercise purpose/form. Self management techniques. HEP with handout ?Person educated: Patient ?Education method: Explanation, Demonstration, tactile cues and Verbal cues ?Education comprehension: verbalized understanding, returned demonstration, verbal cues required, and needs further education ?  ?  ?HOME EXERCISE PROGRAM: ?Access Code: [email or web address] ?URL: [email or web address] ?Date: 06/26/2021 ?Prepared by: Norton BlizzardSara Maleiah Dula ? ?Exercises ?- Seated Flexion Stretch with Swiss Ball  - 1 x daily - 1 sets - 20 reps - 5 seconds hold ?- Supine Dead Bug with Leg Extension  - 3-5 x weekly - 3 sets - 10 reps ?- Seated Anti-Rotation Press with Anchored Resistance  - 3-5 x weekly - 3 sets - 15-20 reps - 5 seconds hold ?- Bridge with Hip Abduction and Resistance  - 3-5 x weekly - 10-20  sets - 3 reps ?  ?ASSESSMENT: ?  ?CLINICAL IMPRESSION: ?Patient tolerated treatment well with brief improvement in low back pain following dry needling and manual therapy. Pain returned as he felt his back "tighten up" after pallof press. Left foot pain nearly gone by end of session, but patient states it comes and goes. Plan to further assess effect of dry needling upon return visit. Patient with concordant tenderness and tightness to left lumbar paraspinals near L2 and had strong gripping response of muscles around needle inserted at that  region. Patient would benefit from continued management of limiting condition by skilled physical therapist to address remaining impairments and functional limitations to work towards stated goals and return t

## 2021-06-29 ENCOUNTER — Ambulatory Visit: Payer: Medicare Other | Admitting: Physical Therapy

## 2021-06-29 ENCOUNTER — Encounter: Payer: Self-pay | Admitting: Physical Therapy

## 2021-06-29 DIAGNOSIS — M5459 Other low back pain: Secondary | ICD-10-CM

## 2021-06-29 DIAGNOSIS — M5415 Radiculopathy, thoracolumbar region: Secondary | ICD-10-CM

## 2021-06-29 DIAGNOSIS — M79672 Pain in left foot: Secondary | ICD-10-CM

## 2021-07-03 NOTE — Therapy (Incomplete)
?OUTPATIENT PHYSICAL THERAPY TREATMENT NOTE ? ? ?Patient Name: Charles Mooney ?MRN: [id number] ?DOB:[birth year], 66 y.o., male ?Today's Date: 07/03/2021 ? ?PCP: Enid Baas, MD ?REFERRING PROVIDER: Edward Jolly, MD ? ?END OF SESSION:  ? ? ? ? ?Past Medical History:  ?Diagnosis Date  ? Arthritis   ? Hepatitis   ? c 2000  ? Hypertension   ? Subarachnoid hemorrhage (HCC) 2017  ? Wears hearing aid in both ears   ? ?Past Surgical History:  ?Procedure Laterality Date  ? BRAIN SURGERY    ? [facility] COILING 2017  ? HERNIA REPAIR  1963  ? double hernia repair  ? JOINT REPLACEMENT Bilateral   ? TKR  L-2012, R-2015  ? SHOULDER ARTHROSCOPY WITH ROTATOR CUFF REPAIR AND OPEN BICEPS TENODESIS Left 09/08/2018  ? Procedure: LEFT SHOULDER ARTHROSCOPY,SUBSACAPULARIS REPAIR, SUBACROMIAL DECOMP, DISTAL CLAVICLE EXCISION,BICEP TENODESIS, MINI OPEN REGENTEN PATCH APPLICATION;  Surgeon: Signa Kell, MD;  Location: [facility] ORS;  Service: Orthopedics;  Laterality: Left;  ? ?Patient Active Problem List  ? Diagnosis Date Noted  ? Arthropathy of left wrist 10/12/2020  ? Viral hepatitis C 11/02/2019  ? Lumbar facet arthropathy 04/27/2019  ? Lumbar degenerative disc disease 04/27/2019  ? Localized osteoarthritis of shoulder regions, bilateral 04/27/2019  ? Encounter for long-term opiate analgesic use 04/27/2019  ? Chronic SI joint pain 04/27/2019  ? Chronic pain syndrome 04/27/2019  ? Pain in joint of left shoulder 06/03/2018  ? Abnormal gait 06/18/2017  ? Anatomical narrow angle 06/18/2017  ? Derangement of posterior horn of medial meniscus 06/18/2017  ? Edema 06/18/2017  ? Hip pain 06/18/2017  ? History of total knee arthroplasty 06/18/2017  ? Localized, primary osteoarthritis 06/18/2017  ? Muscle weakness 06/18/2017  ? 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 Acute bursitis of right shoulder 06/14/2017  ? Nonallopathic lesion of sacral region 06/14/2017  ? Nonallopathic lesion of thoracic region 06/14/2017  ? Nonallopathic lesion of  lumbosacral region 06/14/2017  ? Biomechanical lesion 06/14/2017  ? Chronic bilateral low back pain with bilateral sciatica 05/23/2017  ? Arthritis 04/19/2013  ? Cataract nuclear 06/16/2012  ? Glaucoma suspect of both eyes 06/16/2012  ? ? ?REFERRING DIAG:  lumbar facet arthropathy, lumbar degenerative disc disease, chronic pain syndrome ? ?THERAPY DIAG:  ?No diagnosis found. ? ?ONSET DATE: 1979 ? ?PERTINENT HISTORY: Patient is a 66 y.o. male who presents to outpatient physical therapy with a referral for medical diagnosis  lumbar facet arthropathy, lumbar degenerative disc disease, chronic pain syndrome. This patient's chief complaints consist of chronic left sided low back pain, history of left anterior thigh paresthesia, and left medial heel pain leading to the following functional deficits: limits him everywhere to some amount, working on projects, walking for exercise, sleeping, doing as much as he wants to, prolonged sitting, prolonged walking, lifting, bending. Relevant past medical history and comorbidities include bilateral shoulder surgery (RTC repair, R most recent 12/2020), bilateral TKA, subarachnoid hemorrhage 2017 with SAH coiling, HTN, hx of hepatitis C (cured), double hernia repair, plantar facial fibromatosis, chronic pain syndrome, former smoker.  Patient denies hx of cancer, seizures, lung problems, heart problems, diabetes, unexplained weight loss, unexplained changes in bowel or bladder problems, unexplained stumbling or dropping things, osteoporosis, and spinal surgery. ? ?PRECAUTIONS: None, recent R shoulder surgery ? ?SUBJECTIVE: Patient states his back has been very crampy and spasming over the last couple of days and he has not seemed to be able to "get out of it." He is also seeing a chiropractor who he has been seeing  a couple of years and his back pain got worse after his last visit with the chiropractor on Wednesday, which is unusual. He reports chiropractor does HVLA thrust, the  "thumper" but not needles, estim, or prescribe exercises. He feels the concerted effort between chiro and PT has changed things in his back a bit and the muscles haven't gotten used to it yet. HEP is going well.  ? ?PAIN:  ?Are you having pain? Yes: NPRS scale: 6/10 left low back and 4/10 left heel.  ? ?OBJECTIVE:  ? ?TODAY'S TREATMENT  ?Therapeutic exercise: to centralize symptoms and improve ROM, strength, muscular endurance, and activity tolerance required for successful completion of functional activities.  ?- NuStep level 6 using bilateral upper and lower extremities. Seat/handle setting 14/14. For improved extremity mobility, muscular endurance, and activity tolerance; and to induce the analgesic effect of aerobic exercise, stimulate improved joint nutrition, and prepare body structures and systems for following interventions. x 5:30  minutes. Average SPM = 102 ?- seated lumbar flexion with diagonals with clear theraball roll out, self selected pace over 2 min. (Improved foot pain slightly). ?(Manual therapy/dry needling - see below).  ?- Standing pallof press (multifidus press) with black theraband 2x15 each side. Cuing for slight PPT.  ? ?Manual therapy: to reduce pain and tissue tension, improve range of motion, neuromodulation, in order to promote improved ability to complete functional activities. ?PRONE ?- STM to bilateral lumbar paraspinals, L > R ? ?Modality: (unbilled) ?Dry needling performed to lumbar spine to decrease pain and spasms along patient?s low back and L foot region with patient in prone utilizing 3 dry needle(s) .30mm x 75mm with 5 sticks total at bilateral multifidi at approx L2-L3 levels. Patient educated about the risks and benefits from therapy and verbally consents to treatment.  ?Dry needling performed by Luretha MurphySara R. Ilsa IhaSnyder PT, DPT who is certified in this technique. ? ?Pt required multimodal cuing for proper technique and to facilitate improved neuromuscular control, strength, range of  motion, and functional ability resulting in improved performance and form. ?  ?  ?PATIENT EDUCATION:  ?Education details: Exercise purpose/form. Self management techniques. HEP with handout ?Person educated: Patient ?Education method: Explanation, Demonstration, tactile cues and Verbal cues ?Education comprehension: verbalized understanding, returned demonstration, verbal cues required, and needs further education ?  ?  ?HOME EXERCISE PROGRAM: ?Access Code: [email or web address] ?URL: [email or web address] ?Date: 06/26/2021 ?Prepared by: Norton BlizzardSara Rudolpho Claxton ? ?Exercises ?- Seated Flexion Stretch with Swiss Ball  - 1 x daily - 1 sets - 20 reps - 5 seconds hold ?- Supine Dead Bug with Leg Extension  - 3-5 x weekly - 3 sets - 10 reps ?- Seated Anti-Rotation Press with Anchored Resistance  - 3-5 x weekly - 3 sets - 15-20 reps - 5 seconds hold ?- Bridge with Hip Abduction and Resistance  - 3-5 x weekly - 10-20 sets - 3 reps ?  ?ASSESSMENT: ?  ?CLINICAL IMPRESSION: ?Patient tolerated treatment well with brief improvement in low back pain following dry needling and manual therapy. Pain returned as he felt his back "tighten up" after pallof press. Left foot pain nearly gone by end of session, but patient states it comes and goes. Plan to further assess effect of dry needling upon return visit. Patient with concordant tenderness and tightness to left lumbar paraspinals near L2 and had strong gripping response of muscles around needle inserted at that region. Patient would benefit from continued management of limiting condition by skilled physical therapist to address remaining impairments and functional limitations  to work towards stated goals and return to PLOF or maximal functional independence.  ? ?Patient is a 66 y.o. male referred to outpatient physical therapy with a medical diagnosis of lumbar facet arthropathy, lumbar degenerative disc disease, chronic pain syndrome who presents with signs and symptoms consistent with  chronic low back pain with acute left sided low back pain with history of radiation to right anterior thigh and current left medial heel pain, likely radiculopathy or local source such as plantar fasciitis. Patient prese

## 2021-07-04 ENCOUNTER — Ambulatory Visit: Payer: Medicare Other | Admitting: Physical Therapy

## 2021-07-04 ENCOUNTER — Telehealth: Payer: Self-pay | Admitting: Physical Therapy

## 2021-07-04 NOTE — Telephone Encounter (Signed)
Called patient when he did not show up for his PT appointment scheduled for today at 11:15am. Left VM notifying patient of missed visit and requesting call back ASAP to reschedule.  ? ?Everlean Alstrom. Graylon Good, PT, DPT ?07/04/21, 11:46 AM ? ?[facility] ?[address] ?[place], [place] [address] ?PPV:[id number] I F: [phone] ? ?

## 2021-07-05 ENCOUNTER — Ambulatory Visit: Payer: Medicare Other | Admitting: Physical Therapy

## 2021-07-05 ENCOUNTER — Encounter: Payer: Self-pay | Admitting: Physical Therapy

## 2021-07-05 DIAGNOSIS — M5459 Other low back pain: Secondary | ICD-10-CM

## 2021-07-05 DIAGNOSIS — M79672 Pain in left foot: Secondary | ICD-10-CM

## 2021-07-05 NOTE — Therapy (Signed)
?OUTPATIENT PHYSICAL THERAPY TREATMENT NOTE ? ? ?Patient Name: Charles Mooney ?MRN: [id number] ?DOB:[birth year], 66 y.o., male ?Today's Date: 07/05/2021 ? ?PCP: Enid Baas, MD ?REFERRING PROVIDER: Edward Jolly, MD ? ?END OF SESSION:  ? PT End of Session - 07/05/21 1510   ? ? Visit Number 4   ? Number of Visits 24   ? Date for PT Re-Evaluation 09/13/21   ? Authorization Type MEDICARE PART B reporting period from 06/21/2021   ? Progress Note Due on Visit 10   ? PT Start Time 1120   ? PT Stop Time 1203   ? PT Time Calculation (min) 43 min   ? Activity Tolerance Patient tolerated treatment well   ? Behavior During Therapy [facility] for tasks assessed/performed   ? ?  ?  ? ?  ? ? ? ? ?Past Medical History:  ?Diagnosis Date  ? Arthritis   ? Hepatitis   ? c 2000  ? Hypertension   ? Subarachnoid hemorrhage (HCC) 2017  ? Wears hearing aid in both ears   ? ?Past Surgical History:  ?Procedure Laterality Date  ? BRAIN SURGERY    ? [facility] COILING 2017  ? HERNIA REPAIR  1963  ? double hernia repair  ? JOINT REPLACEMENT Bilateral   ? TKR  L-2012, R-2015  ? SHOULDER ARTHROSCOPY WITH ROTATOR CUFF REPAIR AND OPEN BICEPS TENODESIS Left 09/08/2018  ? Procedure: LEFT SHOULDER ARTHROSCOPY,SUBSACAPULARIS REPAIR, SUBACROMIAL DECOMP, DISTAL CLAVICLE EXCISION,BICEP TENODESIS, MINI OPEN REGENTEN PATCH APPLICATION;  Surgeon: Signa Kell, MD;  Location: [facility] ORS;  Service: Orthopedics;  Laterality: Left;  ? ?Patient Active Problem List  ? Diagnosis Date Noted  ? Arthropathy of left wrist 10/12/2020  ? Viral hepatitis C 11/02/2019  ? Lumbar facet arthropathy 04/27/2019  ? Lumbar degenerative disc disease 04/27/2019  ? Localized osteoarthritis of shoulder regions, bilateral 04/27/2019  ? Encounter for long-term opiate analgesic use 04/27/2019  ? Chronic SI joint pain 04/27/2019  ? Chronic pain syndrome 04/27/2019  ? Pain in joint of left shoulder 06/03/2018  ? Abnormal gait 06/18/2017  ? Anatomical narrow angle 06/18/2017  ? Derangement of posterior  horn of medial meniscus 06/18/2017  ? Edema 06/18/2017  ? Hip pain 06/18/2017  ? History of total knee arthroplasty 06/18/2017  ? Localized, primary osteoarthritis 06/18/2017  ? Muscle weakness 06/18/2017  ? 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 Acute bursitis of right shoulder 06/14/2017  ? Nonallopathic lesion of sacral region 06/14/2017  ? Nonallopathic lesion of thoracic region 06/14/2017  ? Nonallopathic lesion of lumbosacral region 06/14/2017  ? Biomechanical lesion 06/14/2017  ? Chronic bilateral low back pain with bilateral sciatica 05/23/2017  ? Arthritis 04/19/2013  ? Cataract nuclear 06/16/2012  ? Glaucoma suspect of both eyes 06/16/2012  ? ? ?REFERRING DIAG:  lumbar facet arthropathy, lumbar degenerative disc disease, chronic pain syndrome ? ?THERAPY DIAG:  ?Other low back pain ? ?Pain in left foot ? ?ONSET DATE: 1979 ? ?PERTINENT HISTORY: Patient is a 66 y.o. male who presents to outpatient physical therapy with a referral for medical diagnosis  lumbar facet arthropathy, lumbar degenerative disc disease, chronic pain syndrome. This patient's chief complaints consist of chronic left sided low back pain, history of left anterior thigh paresthesia, and left medial heel pain leading to the following functional deficits: limits him everywhere to some amount, working on projects, walking for exercise, sleeping, doing as much as he wants to, prolonged sitting, prolonged walking, lifting, bending. Relevant past medical history and comorbidities include bilateral shoulder surgery (RTC repair,  R most recent 12/2020), bilateral TKA, subarachnoid hemorrhage 2017 with SAH coiling, HTN, hx of hepatitis C (cured), double hernia repair, plantar facial fibromatosis, chronic pain syndrome, former smoker.  Patient denies hx of cancer, seizures, lung problems, heart problems, diabetes, unexplained weight loss, unexplained changes in bowel or bladder problems, unexplained stumbling or dropping things, osteoporosis, and  spinal surgery. ? ?PRECAUTIONS: None, recent R shoulder surgery ? ?SUBJECTIVE: Patient states he felt dry needling really helped him and he was feeling so good he did a lot of work an his right low back started hurting a lot Friday. It is not in too good shape currently and he rates the pain 7/10 there which is better than how bad it got this weekend. He states there was an "incident with the hornet" and he got away but "not uninjured." He came very close to falling but did not fall. He did have a lot of twisting and tripping action. His left heel continues to hurt intermittently but not as often.  ? ?PAIN:  ?Are you having pain? Yes: NPRS scale: 7/10 right low back and 0/10 left heel currently but 5/10 when he was driving over here.  ? ?OBJECTIVE:  ? ?TODAY'S TREATMENT  ?Therapeutic exercise: to centralize symptoms and improve ROM, strength, muscular endurance, and activity tolerance required for successful completion of functional activities.  ?- NuStep level 6 using bilateral upper and lower extremities. Seat/handle setting 14/14. For improved extremity mobility, muscular endurance, and activity tolerance; and to induce the analgesic effect of aerobic exercise, stimulate improved joint nutrition, and prepare body structures and systems for following interventions. x 5:30  minutes. Average SPM = 102 ?(Manual therapy/dry needling - see below).  ?- prone multifidus kick, 3x10 each side (initially had cramping in left thoracic region).  ?- seated lumbar flexion with diagonals with green theraball roll out, self selected pace over 2 min.  ?- seated row on OMEGA machine with trunk unsupported, 3x15 at 20# ?- stepstanding pallof press (multifidus press) with one foot on flat side of large half spikeball using black theraband 2x10 each foot on each side. SBA-CGA. ? ?Manual therapy: to reduce pain and tissue tension, improve range of motion, neuromodulation, in order to promote improved ability to complete functional  activities. ?PRONE ?- STM to bilateral lumbar paraspinals and QL, R > L .  ? ?Modality: (unbilled) ?Dry needling performed to lumbar spine to decrease pain and spasms along patient?s low back and L foot region with patient in prone utilizing 2 dry needle(s) .54mm x 31mm with 4 sticks total at bilateral multifidi at approx L4-5 levels. Patient educated about the risks and benefits from therapy and verbally consents to treatment.  ?Dry needling performed by Luretha Murphy. Ilsa Iha PT, DPT who is certified in this technique. ? ?Pt required multimodal cuing for proper technique and to facilitate improved neuromuscular control, strength, range of motion, and functional ability resulting in improved performance and form. ?  ?  ?PATIENT EDUCATION:  ?Education details: Exercise purpose/form. Self management techniques. HEP with handout ?Person educated: Patient ?Education method: Explanation, Demonstration, tactile cues and Verbal cues ?Education comprehension: verbalized understanding, returned demonstration, verbal cues required, and needs further education ?  ?  ?HOME EXERCISE PROGRAM: ?Access Code: [email or web address] ?URL: [email or web address] ?Date: 06/26/2021 ?Prepared by: Norton Blizzard ? ?Exercises ?- Seated Flexion Stretch with Swiss Ball  - 1 x daily - 1 sets - 20 reps - 5 seconds hold ?- Supine Dead Bug with Leg Extension  - 3-5 x weekly - 3 sets -  10 reps ?- Seated Anti-Rotation Press with Anchored Resistance  - 3-5 x weekly - 3 sets - 15-20 reps - 5 seconds hold ?- Bridge with Hip Abduction and Resistance  - 3-5 x weekly - 10-20 sets - 3 reps ?  ?ASSESSMENT: ?  ?CLINICAL IMPRESSION: ?Patient tolerated treatment well and reported reduction in pain to 4/10 by end of dry needling and exercise. Continued to utilize dry needling and manual therapy to relax muscle tension and decrease pain and core strengthening for long term improvement. Plan to continue with similar interventions progressed as appropriate next session.  Patient would benefit from continued management of limiting condition by skilled physical therapist to address remaining impairments and functional limitations to work towards stated goals [facility]

## 2021-07-10 ENCOUNTER — Ambulatory Visit: Payer: Medicare Other | Admitting: Physical Therapy

## 2021-07-10 ENCOUNTER — Encounter: Payer: Self-pay | Admitting: Physical Therapy

## 2021-07-10 DIAGNOSIS — M5415 Radiculopathy, thoracolumbar region: Secondary | ICD-10-CM

## 2021-07-10 DIAGNOSIS — M5459 Other low back pain: Secondary | ICD-10-CM

## 2021-07-10 DIAGNOSIS — M79672 Pain in left foot: Secondary | ICD-10-CM

## 2021-07-10 NOTE — Therapy (Signed)
OUTPATIENT PHYSICAL THERAPY TREATMENT NOTE   Patient Name: Charles Mooney MRN: [id number] DOB:[birth year], 66 y.o., male Today's Date: 07/10/2021  PCP: Enid Baas, MD REFERRING PROVIDER: Edward Jolly, MD  END OF SESSION:   PT End of Session - 07/10/21 4628     Visit Number 5    Number of Visits 24    Date for PT Re-Evaluation 09/13/21    Authorization Type MEDICARE PART B reporting period from 06/21/2021    Progress Note Due on Visit 10    PT Start Time 0950    PT Stop Time 1031    PT Time Calculation (min) 41 min    Activity Tolerance Patient tolerated treatment well    Behavior During Therapy [facility] for tasks assessed/performed                Past Medical History:  Diagnosis Date   Arthritis    Hepatitis    c 2000   Hypertension    Subarachnoid hemorrhage (HCC) 2017   Wears hearing aid in both ears    Past Surgical History:  Procedure Laterality Date   BRAIN SURGERY     [facility] COILING 2017   HERNIA REPAIR  1963   double hernia repair   JOINT REPLACEMENT Bilateral    TKR  L-2012, R-2015   SHOULDER ARTHROSCOPY WITH ROTATOR CUFF REPAIR AND OPEN BICEPS TENODESIS Left 09/08/2018   Procedure: LEFT SHOULDER ARTHROSCOPY,SUBSACAPULARIS REPAIR, SUBACROMIAL DECOMP, DISTAL CLAVICLE EXCISION,BICEP TENODESIS, MINI OPEN REGENTEN PATCH APPLICATION;  Surgeon: Signa Kell, MD;  Location: [facility] ORS;  Service: Orthopedics;  Laterality: Left;   Patient Active Problem List   Diagnosis Date Noted   Arthropathy of left wrist 10/12/2020   Viral hepatitis C 11/02/2019   Lumbar facet arthropathy 04/27/2019   Lumbar degenerative disc disease 04/27/2019   Localized osteoarthritis of shoulder regions, bilateral 04/27/2019   Encounter for long-term opiate analgesic use 04/27/2019   Chronic SI joint pain 04/27/2019   Chronic pain syndrome 04/27/2019   Pain in joint of left shoulder 06/03/2018   Abnormal gait 06/18/2017   Anatomical narrow angle 06/18/2017   Derangement of  posterior horn of medial meniscus 06/18/2017   Edema 06/18/2017   Hip pain 06/18/2017   History of total knee arthroplasty 06/18/2017   Localized, primary osteoarthritis 06/18/2017   Muscle weakness 06/18/2017   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   Acute bursitis of right shoulder 06/14/2017   Nonallopathic lesion of sacral region 06/14/2017   Nonallopathic lesion of thoracic region 06/14/2017   Nonallopathic lesion of lumbosacral region 06/14/2017   Biomechanical lesion 06/14/2017   Chronic bilateral low back pain with bilateral sciatica 05/23/2017   Arthritis 04/19/2013   Cataract nuclear 06/16/2012   Glaucoma suspect of both eyes 06/16/2012    REFERRING DIAG:  lumbar facet arthropathy, lumbar degenerative disc disease, chronic pain syndrome  THERAPY DIAG:  Other low back pain  Pain in left foot  Radiculopathy, thoracolumbar region  Rationale for Evaluation and Treatment: Rehabilitation   ONSET DATE: 1979  PERTINENT HISTORY: Patient is a 66 y.o. male who presents to outpatient physical therapy with a referral for medical diagnosis  lumbar facet arthropathy, lumbar degenerative disc disease, chronic pain syndrome. This patient's chief complaints consist of chronic left sided low back pain, history of left anterior thigh paresthesia, and left medial heel pain leading to the following functional deficits: limits him everywhere to some amount, working on projects, walking for exercise, sleeping, doing as much as he wants to, prolonged sitting, prolonged walking, lifting,  bending. Relevant past medical history and comorbidities include bilateral shoulder surgery (RTC repair, R most recent 12/2020), bilateral TKA, subarachnoid hemorrhage 2017 with SAH coiling, HTN, hx of hepatitis C (cured), double hernia repair, plantar facial fibromatosis, chronic pain syndrome, former smoker.  Patient denies hx of cancer, seizures, lung problems, heart problems, diabetes, unexplained weight loss,  unexplained changes in bowel or bladder problems, unexplained stumbling or dropping things, osteoporosis, and spinal surgery.  PRECAUTIONS: None, recent R shoulder surgery  SUBJECTIVE: Patient states he is feeling pretty good and has no pain upon arrival. He states he continues to be limited to walking/hiking for ~15 min but he would like to be able to walk/hike for 1 hour. He used to be able to walk/hike for 2 hours. He states he is not practicing walking currently due to increased back and heel pain with walking more than 15 min. He is going to [place] for a month starting next week for the birth of his granddaughter. He plans to schedule more PT visits when he knows better when he will be back.   At 15 min, wants to be able to hike/walk for 1 hour. Heel limits him as much as the back.   PAIN:  Are you having pain? No   OBJECTIVE  SELF-REPORTED FUNCTION FOTO score: 59/100 (lumbar spine questionnaire)   TODAY'S TREATMENT  Therapeutic exercise: to centralize symptoms and improve ROM, strength, muscular endurance, and activity tolerance required for successful completion of functional activities.  - NuStep level 5 using bilateral upper and lower extremities. Seat/handle setting 14/14. For improved extremity mobility, muscular endurance, and activity tolerance; and to induce the analgesic effect of aerobic exercise, stimulate improved joint nutrition, and prepare body structures and systems for following interventions. x 5:00  minutes. Average SPM = not measured. - Hooklying LTR with green theraball under feet, 1x2 min  - Hooklying dead bug feet only, 2x5-10 each side (caused pain at lateral lower abdomen and low back).  - attempted various ways to stretch QL including in standing and side glide (neither satisfactory or caused pain on contralateral side) - sidelying QL stretch with knees bent and hanging off edge of plinth, 2x30 seconds each side. Feels more with left side stretching.  -  Education on HEP including handout  (Manual therapy- see below).  - Sidelying open book (thoracic rotation) to improve thoracic, shoulder girdle, and upper trunk mobility. Required instruction for technique and cuing to achieve end range as tolerated, hold time, and breathing technique. 5 second holds. 1x5 each side (feels great).   Manual therapy: to reduce pain and tissue tension, improve range of motion, neuromodulation, in order to promote improved ability to complete functional activities. PRONE - STM to left lumbar paraspinals and QL.  - CPA and left UPA to mid to upper lumbar spine, grade III-IV  Pt required multimodal cuing for proper technique and to facilitate improved neuromuscular control, strength, range of motion, and functional ability resulting in improved performance and form.     PATIENT EDUCATION:  Education details: Exercise purpose/form. Self management techniques. HEP with handout Person educated: Patient Education method: Explanation, Demonstration, tactile cues and Verbal cues Education comprehension: verbalized understanding, returned demonstration, verbal cues required, and needs further education     HOME EXERCISE PROGRAM: Access Code: [email or web address] URL: [email or web address] Date: 07/10/2021 Prepared by: Rosita Kea  Exercises - Seated Flexion Stretch with Swiss Ball  - 1 x daily - 1 sets - 20 reps - 5 seconds hold - Supine Dead Bug with Leg  Extension  - 3-5 x weekly - 3 sets - 10 reps - Seated Anti-Rotation Press with Anchored Resistance  - 3-5 x weekly - 3 sets - 15-20 reps - 5 seconds hold - Bridge with Hip Abduction and Resistance  - 3-5 x weekly - 10-20 sets - 3 reps - Sidelying Quadratus Lumborum Stretch on Table  - 3 sets - 30 seconds hold - Sidelying Thoracic Rotation with Open Book  - 1-2 x daily - 20 sets - 10 reps - 5 seconds holdp Abduction and Resistance  - 3-5 x weekly - 10-20 sets - 3 reps   ASSESSMENT:   CLINICAL  IMPRESSION: Patient tolerated treatment well and arrives feeling better but continues to be symptomatic wit manual pressure at the upper lumbar spine and with sidebending stretches to the QL and lumbar region. Patient felt some relief with open book thoracolumbar rotation and it was added to HEP. Plan to continue with exercises and manual/dry needling as appropriate next session as patient prepares to leave town for at least a month. Patient would benefit from continued management of limiting condition by skilled physical therapist to address remaining impairments and functional limitations to work towards stated goals and return to PLOF or maximal functional independence.   Patient is a 66 y.o. male referred to outpatient physical therapy with a medical diagnosis of lumbar facet arthropathy, lumbar degenerative disc disease, chronic pain syndrome who presents with signs and symptoms consistent with chronic low back pain with acute left sided low back pain with history of radiation to right anterior thigh and current left medial heel pain, likely radiculopathy or local source such as plantar fasciitis. Patient presents with significant pain, muscle spasm, joint stiffness, ROM, flexibility, paresthesia, muscle performance (strength/power/endurance) and activity tolerance impairments that are limiting ability to complete his usual activities including everything to some amount, working on projects, walking for exercise, sleeping, doing as much as he wants to, prolonged sitting, prolonged walking, lifting, bending without difficulty. Patient will benefit from skilled physical therapy intervention to address current body structure impairments and activity limitations to improve function and work towards goals set in current POC in order to return to prior level of function or maximal functional improvement.      OBJECTIVE IMPAIRMENTS decreased activity tolerance, decreased endurance, decreased knowledge of  condition, difficulty walking, decreased ROM, decreased strength, hypomobility, impaired perceived functional ability, increased muscle spasms, impaired flexibility, and pain.    ACTIVITY LIMITATIONS cleaning, community activity, occupation, yard work, and   usual activities including everything to some amount, working on projects, walking for exercise, sleeping, doing as much as he wants to, prolonged sitting, prolonged walking, lifting, bending.    PERSONAL FACTORS Age, Past/current experiences, Time since onset of injury/illness/exacerbation, and 3+ comorbidities: bilateral shoulder surgery (RTC repair, R most recent 12/2020), bilateral TKA, subarachnoid hemorrhage 2017 with SAH coiling, HTN, hx of hepatitis C (cured), double hernia repair, plantar facial fibromatosis, chronic pain syndrome, former smoker  are also affecting patient's functional outcome.      REHAB POTENTIAL: Good   CLINICAL DECISION MAKING: Stable/uncomplicated   EVALUATION COMPLEXITY: Low     GOALS: Goals reviewed with patient? No   SHORT TERM GOALS: Target date: 07/05/2021   Patient will be independent with initial home exercise program for self-management of symptoms. Baseline: Initial HEP to be provided at visit 2 as appropriate (06/21/21); Initial HEP provided at visit 2 (06/26/2021);  Goal status: Achieved 06/29/21     LONG TERM GOALS: Target date: 09/13/2021  Patient will be independent with a long-term home exercise program for self-management of symptoms.  Baseline: Initial HEP to be provided at visit 2 as appropriate (06/21/21); Initial HEP provided at visit 2 (06/26/2021); participating well (07/10/2021);  Goal status: In-progress   2.  Patient will demonstrate improved FOTO to equal or greater than 58 by visit #12 to demonstrate improvement in overall condition and self-reported functional ability.  Baseline: 51 (06/21/21); 59 at visit #5 (07/10/2021);  Goal status: Achieved 07/10/2021   3.  Patient will  ambulate equal or greater than 1200 feet on 6 Min Walk Test with no increase in pain to demonstrate improved ability to go walking with his wife.  Baseline: to be tested visit 2 as appropriate (06/21/21); Goal status: In-progress   4.  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  Baseline: pain up to 7/10 (06/21/21); Goal status: In-progress   5.  Patient will complete community, work and/or recreational activities without limitation due to current condition.  Baseline: usual activities including everything to some amount, working on projects, walking for exercise, sleeping, doing as much as he wants to, prolonged sitting, prolonged walking, lifting, bending. (06/21/21); Goal status: In-progress       PLAN: PT FREQUENCY: 1-2x/week   PT DURATION: 12 weeks   PLANNED INTERVENTIONS: Therapeutic exercises, Therapeutic activity, Neuromuscular re-education, Balance training, Patient/Family education, Joint mobilization, Dry Needling, Spinal mobilization, Cryotherapy, Moist heat, Traction, and Manual therapy.   PLAN FOR NEXT SESSION: update HEP as appropriate, progressive core/LE/functional strengthening, stretching, and motor control as tolerated, manual therapy as needed.    Everlean Alstrom. Graylon Good, PT, DPT 07/10/21, 8:58 PM  [facility] [address] [place], [place] [address] P: [phone] I F: [phone]

## 2021-07-11 ENCOUNTER — Encounter: Payer: Medicare Other | Admitting: Physical Therapy

## 2021-07-12 NOTE — Therapy (Signed)
OUTPATIENT PHYSICAL THERAPY TREATMENT NOTE   Patient Name: Charles Mooney MRN: [id number] DOB:04/01/1955, 66 y.o., male Today's Date: 07/13/2021  PCP: Enid BaasKalisetti, Radhika, MD REFERRING PROVIDER: Edward JollyLateef, Bilal, MD  END OF SESSION:   PT End of Session - 07/13/21 0959     Visit Number 6    Number of Visits 24    Date for PT Re-Evaluation 09/13/21    Authorization Type MEDICARE PART B reporting period from 06/21/2021    Progress Note Due on Visit 10    PT Start Time 0954    PT Stop Time 1032    PT Time Calculation (min) 38 min    Activity Tolerance Patient tolerated treatment well    [facility] for tasks assessed/performed                 Past Medical History:  Diagnosis Date   Arthritis    Hepatitis    c 2000   Hypertension    Subarachnoid hemorrhage (HCC) 2017   Wears hearing aid in both ears    Past Surgical History:  Procedure Laterality Date   [facility]   HERNIA REPAIR  1963   double hernia repair   JOINT REPLACEMENT Bilateral    TKR  L-2012, R-2015   SHOULDER ARTHROSCOPY WITH ROTATOR CUFF REPAIR AND OPEN BICEPS TENODESIS Left 09/08/2018   Procedure: LEFT SHOULDER ARTHROSCOPY,SUBSACAPULARIS REPAIR, SUBACROMIAL DECOMP, DISTAL CLAVICLE EXCISION,BICEP TENODESIS, MINI OPEN REGENTEN PATCH APPLICATION;  Surgeon: Signa KellPatel, Sunny, MD;  Location: [facility] ORS;  Service: Orthopedics;  Laterality: Left;   Patient Active Problem List   Diagnosis Date Noted   Arthropathy of left wrist 10/12/2020   Viral hepatitis C 11/02/2019   Lumbar facet arthropathy 04/27/2019   Lumbar degenerative disc disease 04/27/2019   Localized osteoarthritis of shoulder regions, bilateral 04/27/2019   Encounter for long-term opiate analgesic use 04/27/2019   Chronic SI joint pain 04/27/2019   Chronic pain syndrome 04/27/2019   Pain in joint of left shoulder 06/03/2018   Abnormal gait 06/18/2017   Anatomical narrow angle 06/18/2017   Derangement of  posterior horn of medial meniscus 06/18/2017   Edema 06/18/2017   Hip pain 06/18/2017   History of total knee arthroplasty 06/18/2017   Localized, primary osteoarthritis 06/18/2017   Muscle weakness 06/18/2017   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   Acute bursitis of right shoulder 06/14/2017   Nonallopathic lesion of sacral region 06/14/2017   Nonallopathic lesion of thoracic region 06/14/2017   Nonallopathic lesion of lumbosacral region 06/14/2017   Biomechanical lesion 06/14/2017   Chronic bilateral low back pain with bilateral sciatica 05/23/2017   Arthritis 04/19/2013   Cataract nuclear 06/16/2012   Glaucoma suspect of both eyes 06/16/2012    REFERRING DIAG:  lumbar facet arthropathy, lumbar degenerative disc disease, chronic pain syndrome  THERAPY DIAG:  Other low back pain  Pain in left foot  Radiculopathy, thoracolumbar region  Rationale for Evaluation and Treatment: Rehabilitation   ONSET DATE: 1979  PERTINENT HISTORY: Patient is a 66 y.o. male who presents to outpatient physical therapy with a referral for medical diagnosis  lumbar facet arthropathy, lumbar degenerative disc disease, chronic pain syndrome. This patient's chief complaints consist of chronic left sided low back pain, history of left anterior thigh paresthesia, and left medial heel pain leading to the following functional deficits: limits him everywhere to some amount, working on projects, walking for exercise, sleeping, doing as much as he wants to, prolonged sitting, prolonged walking,  lifting, bending. Relevant past medical history and comorbidities include bilateral shoulder surgery (RTC repair, R most recent 12/2020), bilateral TKA, subarachnoid hemorrhage 2017 with SAH coiling, HTN, hx of hepatitis C (cured), double hernia repair, plantar facial fibromatosis, chronic pain syndrome, former smoker.  Patient denies hx of cancer, seizures, lung problems, heart problems, diabetes, unexplained weight loss,  unexplained changes in bowel or bladder problems, unexplained stumbling or dropping things, osteoporosis, and spinal surgery.  PRECAUTIONS: None, recent R shoulder surgery  SUBJECTIVE: Patient states his back is not bothering him too much but rates the pain there as 5/10 today. He has had more left heel pain in the last two days as well. Also states his right shoulder has bothering him after he did pallof exercise yesterday. He states this happened earlier in the week but it calmed down. He thinks he may need to take a break from the pallof press exercise. Shoulder pian is a dull ache (1-2/10) and jumps to 5-6/10 if he moves it in the wrong way.   PAIN:  Are you having pain? Yes, 5/10 lumbar region, 7/10 left heel, 1-6/10 right shoulder.    OBJECTIVE  TODAY'S TREATMENT  Therapeutic exercise: to centralize symptoms and improve ROM, strength, muscular endurance, and activity tolerance required for successful completion of functional activities.  - NuStep level 5 using bilateral upper and lower extremities. Seat/handle setting 14/14. For improved extremity mobility, muscular endurance, and activity tolerance; and to induce the analgesic effect of aerobic exercise, stimulate improved joint nutrition, and prepare body structures and systems for following interventions. x 5:00  minutes. Average SPM = not measured. - seated lumbar flexion with diagonals with clear theraball roll out, self selected pace over 2 min. (Improved foot pain slightly).  Manual therapy: to reduce pain and tissue tension, improve range of motion, neuromodulation, in order to promote improved ability to complete functional activities. PRONE - STM to bilateral lumbar paraspinals  Modality: (needling unbilled, estim with constant attendance due to delivery through needles requiring presence of PT throughout treatment) PRONE:  Dry needling performed the the low back to decrease pain and spasms along patient's lumbar region with  patient in prone. (4) .15mm x 52mm dry needles inserted into the lumbar multifidi at approximately L3 and L4 on right and left sides of the spine. Estim applied: 4 min of 3/3 milliA at ~6 hz, and 4 min of 3/3 microA at ~60 Hz. Two channels along lumbar multifidi each channel on one side of the spine.  Patient educated about the risks and benefits from dry needling therapy and verbally consents to treatment. Dry needling performed by Cira Rue, PT, DPT who is certified in this technique.  Pt required multimodal cuing for proper technique and to facilitate improved neuromuscular control, strength, range of motion, and functional ability resulting in improved performance and form.     PATIENT EDUCATION:  Education details: Exercise purpose/form. Self management techniques. HEP with handout Person educated: Patient Education method: Explanation, Demonstration, tactile cues and Verbal cues Education comprehension: verbalized understanding, returned demonstration, verbal cues required, and needs further education     HOME EXERCISE PROGRAM: Access Code: [email or web address] URL: [email or web address] Date: 07/13/2021 Prepared by: Norton Blizzard  Exercises - Seated Flexion Stretch with Swiss Ball  - 1 x daily - 1 sets - 20 reps - 5 seconds hold - Supine Dead Bug with Leg Extension  - 3-5 x weekly - 3 sets - 10 reps - Seated Anti-Rotation Press with Anchored Resistance  - 3-5 x weekly -  3 sets - 15-20 reps - 5 seconds hold - Bridge with Hip Abduction and Resistance  - 3-5 x weekly - 10-20 sets - 3 reps - Sidelying Thoracic Rotation with Open Book  - 1-2 x daily - 20 sets - 10 reps - 5 seconds hold   ASSESSMENT:   CLINICAL IMPRESSION: Patient tolerated treatment well overall and reported relief from dry needling with estim. Patient is to be out of town for at least 4 weeks, then plan to continue with manual/dry needling for pain control and exercises for long term improvement as appropriate.  Patient would benefit from continued management of limiting condition by skilled physical therapist to address remaining impairments and functional limitations to work towards stated goals and return to PLOF or maximal functional independence.   Patient is a 66 y.o. male referred to outpatient physical therapy with a medical diagnosis of lumbar facet arthropathy, lumbar degenerative disc disease, chronic pain syndrome who presents with signs and symptoms consistent with chronic low back pain with acute left sided low back pain with history of radiation to right anterior thigh and current left medial heel pain, likely radiculopathy or local source such as plantar fasciitis. Patient presents with significant pain, muscle spasm, joint stiffness, ROM, flexibility, paresthesia, muscle performance (strength/power/endurance) and activity tolerance impairments that are limiting ability to complete his usual activities including everything to some amount, working on projects, walking for exercise, sleeping, doing as much as he wants to, prolonged sitting, prolonged walking, lifting, bending without difficulty. Patient will benefit from skilled physical therapy intervention to address current body structure impairments and activity limitations to improve function and work towards goals set in current POC in order to return to prior level of function or maximal functional improvement.      OBJECTIVE IMPAIRMENTS decreased activity tolerance, decreased endurance, decreased knowledge of condition, difficulty walking, decreased ROM, decreased strength, hypomobility, impaired perceived functional ability, increased muscle spasms, impaired flexibility, and pain.    ACTIVITY LIMITATIONS cleaning, community activity, occupation, yard work, and   usual activities including everything to some amount, working on projects, walking for exercise, sleeping, doing as much as he wants to, prolonged sitting, prolonged walking, lifting,  bending.    PERSONAL FACTORS Age, Past/current experiences, Time since onset of injury/illness/exacerbation, and 3+ comorbidities: bilateral shoulder surgery (RTC repair, R most recent 12/2020), bilateral TKA, subarachnoid hemorrhage 2017 with SAH coiling, HTN, hx of hepatitis C (cured), double hernia repair, plantar facial fibromatosis, chronic pain syndrome, former smoker  are also affecting patient's functional outcome.      REHAB POTENTIAL: Good   CLINICAL DECISION MAKING: Stable/uncomplicated   EVALUATION COMPLEXITY: Low     GOALS: Goals reviewed with patient? No   SHORT TERM GOALS: Target date: 07/05/2021   Patient will be independent with initial home exercise program for self-management of symptoms. Baseline: Initial HEP to be provided at visit 2 as appropriate (06/21/21); Initial HEP provided at visit 2 (06/26/2021);  Goal status: Achieved 06/29/21     LONG TERM GOALS: Target date: 09/13/2021   Patient will be independent with a long-term home exercise program for self-management of symptoms.  Baseline: Initial HEP to be provided at visit 2 as appropriate (06/21/21); Initial HEP provided at visit 2 (06/26/2021); participating well (07/10/2021);  Goal status: In-progress   2.  Patient will demonstrate improved FOTO to equal or greater than 58 by visit #12 to demonstrate improvement in overall condition and self-reported functional ability.  Baseline: 51 (06/21/21); 59 at visit #5 (07/10/2021);  Goal  status: Achieved 07/10/2021   3.  Patient will ambulate equal or greater than 1200 feet on 6 Min Walk Test with no increase in pain to demonstrate improved ability to go walking with his wife.  Baseline: to be tested visit 2 as appropriate (06/21/21); Goal status: In-progress   4.  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  Baseline: pain up to 7/10 (06/21/21); Goal  status: In-progress   5.  Patient will complete community, work and/or recreational activities without limitation due to current condition.  Baseline: usual activities including everything to some amount, working on projects, walking for exercise, sleeping, doing as much as he wants to, prolonged sitting, prolonged walking, lifting, bending. (06/21/21); Goal status: In-progress       PLAN: PT FREQUENCY: 1-2x/week   PT DURATION: 12 weeks   PLANNED INTERVENTIONS: Therapeutic exercises, Therapeutic activity, Neuromuscular re-education, Balance training, Patient/Family education, Joint mobilization, Dry Needling, Spinal mobilization, Cryotherapy, Moist heat, Traction, and Manual therapy.   PLAN FOR NEXT SESSION: update HEP as appropriate, progressive core/LE/functional strengthening, stretching, and motor control as tolerated, manual therapy as needed.    Luretha Murphy. Ilsa Iha, PT, DPT 07/13/21, 4:01 PM  [facility] [address] [place], [place] [address] P: [phone] I F: [phone]

## 2021-07-13 ENCOUNTER — Encounter: Payer: Medicare Other | Admitting: Physical Therapy

## 2021-07-13 ENCOUNTER — Ambulatory Visit: Payer: Medicare Other | Admitting: Physical Therapy

## 2021-07-13 ENCOUNTER — Encounter: Payer: Self-pay | Admitting: Physical Therapy

## 2021-07-13 DIAGNOSIS — M5459 Other low back pain: Secondary | ICD-10-CM

## 2021-07-13 DIAGNOSIS — M79672 Pain in left foot: Secondary | ICD-10-CM

## 2021-07-13 DIAGNOSIS — M5415 Radiculopathy, thoracolumbar region: Secondary | ICD-10-CM

## 2021-07-19 ENCOUNTER — Encounter: Payer: Medicare Other | Admitting: Physical Therapy

## 2021-07-24 ENCOUNTER — Encounter: Payer: Medicare Other | Admitting: Physical Therapy

## 2021-07-26 ENCOUNTER — Encounter: Payer: Medicare Other | Admitting: Physical Therapy

## 2021-07-31 ENCOUNTER — Encounter: Payer: Medicare Other | Admitting: Physical Therapy

## 2021-08-02 ENCOUNTER — Encounter: Payer: Medicare Other | Admitting: Physical Therapy

## 2021-08-18 ENCOUNTER — Telehealth: Payer: Self-pay | Admitting: Student in an Organized Health Care Education/Training Program

## 2021-08-18 ENCOUNTER — Encounter: Payer: Self-pay | Admitting: Student in an Organized Health Care Education/Training Program

## 2021-08-18 NOTE — Telephone Encounter (Addendum)
Patient states he need refill on Tizanidine. Says pharmacy contacted a week ago. Has a Med Mgmt appt 08-31-21 Please advise patient. thanks

## 2021-08-21 ENCOUNTER — Other Ambulatory Visit: Payer: Self-pay | Admitting: *Deleted

## 2021-08-21 DIAGNOSIS — M5136 Other intervertebral disc degeneration, lumbar region: Secondary | ICD-10-CM

## 2021-08-21 DIAGNOSIS — M47816 Spondylosis without myelopathy or radiculopathy, lumbar region: Secondary | ICD-10-CM

## 2021-08-21 DIAGNOSIS — G894 Chronic pain syndrome: Secondary | ICD-10-CM

## 2021-08-21 MED ORDER — TIZANIDINE HCL 4 MG PO TABS: 6.0000 mg | ORAL_TABLET | Freq: Two times a day (BID) | ORAL | 5 refills | Status: DC | PRN

## 2021-08-21 NOTE — Telephone Encounter (Signed)
Rx request sent to [facility].  Order approved.

## 2021-08-21 NOTE — Telephone Encounter (Signed)
Refill request sent to [facility] .

## 2021-08-31 ENCOUNTER — Encounter: Payer: Self-pay | Admitting: Student in an Organized Health Care Education/Training Program

## 2021-08-31 ENCOUNTER — Ambulatory Visit
Payer: Medicare Other | Attending: Student in an Organized Health Care Education/Training Program | Admitting: Student in an Organized Health Care Education/Training Program

## 2021-08-31 VITALS — BP 120/98 | HR 79 | Temp 97.3°F | Resp 14 | Ht 72.0 in | Wt 220.0 lb

## 2021-08-31 DIAGNOSIS — M79672 Pain in left foot: Secondary | ICD-10-CM | POA: Insufficient documentation

## 2021-08-31 DIAGNOSIS — M5136 Other intervertebral disc degeneration, lumbar region: Secondary | ICD-10-CM | POA: Insufficient documentation

## 2021-08-31 DIAGNOSIS — G8929 Other chronic pain: Secondary | ICD-10-CM | POA: Diagnosis present

## 2021-08-31 DIAGNOSIS — M47816 Spondylosis without myelopathy or radiculopathy, lumbar region: Secondary | ICD-10-CM | POA: Diagnosis not present

## 2021-08-31 DIAGNOSIS — G894 Chronic pain syndrome: Secondary | ICD-10-CM | POA: Diagnosis not present

## 2021-08-31 DIAGNOSIS — M51369 Other intervertebral disc degeneration, lumbar region without mention of lumbar back pain or lower extremity pain: Secondary | ICD-10-CM

## 2021-08-31 MED ORDER — HYDROCODONE-ACETAMINOPHEN 10-325 MG PO TABS: 1.0000 | ORAL_TABLET | Freq: Every day | ORAL | 0 refills | Status: DC | PRN

## 2021-08-31 NOTE — Progress Notes (Signed)
Nursing Pain Medication Assessment:  Safety precautions to be maintained throughout the outpatient stay will include: orient to surroundings, keep bed in low position, maintain call bell within reach at all times, provide assistance with transfer out of bed and ambulation.  Medication Inspection Compliance: Pill count conducted under aseptic conditions, in front of the patient. Neither the pills nor the bottle was removed from the patient's sight at any time. Once count was completed pills were immediately returned to the patient in their original bottle.  Medication: Hydrocodone/APAP Pill/Patch Count:  23 of 60 pills remain Pill/Patch Appearance: Markings consistent with prescribed medication Bottle Appearance: Standard pharmacy container. Clearly labeled. Filled Date: 06 / 23 / 2023 Last Medication intake:  Today Safety precautions to be maintained throughout the outpatient stay will include: orient to surroundings, keep bed in low position, maintain call bell within reach at all times, provide assistance with transfer out of bed and ambulation.

## 2021-08-31 NOTE — Patient Instructions (Signed)
You have been referred to podiatry.

## 2021-08-31 NOTE — Progress Notes (Signed)
PROVIDER NOTE: Information contained herein reflects review and annotations entered in association with encounter. Interpretation of such information and data should be left to medically-trained personnel. Information provided to patient can be located elsewhere in the medical record under "Patient Instructions". Document created using STT-dictation technology, any transcriptional errors that may result from process are unintentional.    Patient: Charles Mooney  Service Category: E/M  Provider: Gillis Santa, MD  DOB: 1955-04-17  DOS: 08/31/2021  Specialty: Interventional Pain Management  MRN: [id number]  Setting: Ambulatory outpatient  PCP: Gladstone Lighter, MD  Type: Established Patient    Referring Provider: Gladstone Lighter, MD  Location: Office  Delivery: Face-to-face     HPI  Charles Mooney, a 66 y.o. year old male, is here today because of his Chronic pain of left heel [M79.672, G89.29]. Mr. Prouty's primary complain today is Back Pain (lower)  Last encounter: My last encounter with him was on 06/07/2020. Pertinent problems: [name] has Chronic bilateral low back pain with bilateral sciatica; Acute bursitis of right shoulder; Nonallopathic lesion of sacral region; Lumbar facet arthropathy; Lumbar degenerative disc disease; Localized osteoarthritis of shoulder regions, bilateral; Encounter for long-term opiate analgesic use; Chronic SI joint pain; and Chronic pain syndrome on their pertinent problem list. Pain Assessment: Severity of Chronic pain is reported as a 5 /10. Location: Back Lower/both legs to the feet. Onset: More than a month ago. Quality: Larence Penning. Timing: Constant. Modifying factor(s): stretching. Vitals:  height is 6' (1.829 m) and weight is 220 lb (99.8 kg). His temporal temperature is 97.3 F (36.3 C) (abnormal). His blood pressure is 120/98 (abnormal) and his pulse is 79. His respiration is 14 and oxygen saturation is 97%.   Reason for encounter: medication  management.    [name] presents today for medication management and increased left heel pain.  He states that he has a heel spur.  He has not seen a podiatrist in many years.  I will place referral. He states that his right shoulder is doing much better after his right glenohumeral joint injection done last year on 09/14/2020 and in combination with physical therapy.  Pharmacotherapy Assessment  Analgesic: Hydrocodone 10 mg BID prn   Monitoring: [place] PMP: PDMP reviewed during this encounter.       Pharmacotherapy: No side-effects or adverse reactions reported. Compliance: No problems identified. Effectiveness: Clinically acceptable.  UDS:  Summary  Date Value Ref Range Status  06/06/2021 Note  Final    Comment:    ==================================================================== [employer] (MW) ==================================================================== Test                             Result       Flag       Units  Drug Present and Declared for Prescription Verification   Hydrocodone                    2798         EXPECTED   ng/mg creat   Hydromorphone                  385          EXPECTED   ng/mg creat   Dihydrocodeine                 339          EXPECTED   ng/mg creat   Norhydrocodone  2378         EXPECTED   ng/mg creat    Sources of hydrocodone include scheduled prescription medications.    Hydromorphone, dihydrocodeine and norhydrocodone are expected    metabolites of hydrocodone. Hydromorphone and dihydrocodeine are    also available as scheduled prescription medications.  ==================================================================== Test                      Result    Flag   Units      Ref Range   Creatinine              59               mg/dL      >=20 ==================================================================== Declared Medications:  The flagging and interpretation on this report are based on the  following declared  medications.  Unexpected results may arise from  inaccuracies in the declared medications.   **Note: The testing scope of this panel includes these medications:   Hydrocodone (Norco)   **Note: The testing scope of this panel does not include the  following reported medications:   Acetaminophen (Norco)  Amlodipine (Norvasc)  Cannabidiol  Clindamycin (Cleocin)  Fexofenadine (Allegra)  Fluticasone (Flonase)  Tizanidine (Zanaflex)  Triamcinolone (Kenalog) ==================================================================== For clinical consultation, please call [phone]. ====================================================================       ROS  Constitutional: Denies any fever or chills Gastrointestinal: No reported hemesis, hematochezia, vomiting, or acute GI distress Musculoskeletal:  left heel pain Neurological: No reported episodes of acute onset apraxia, aphasia, dysarthria, agnosia, amnesia, paralysis, loss of coordination, or loss of consciousness  Medication Review  HYDROcodone-acetaminophen, NON FORMULARY, amLODipine, citalopram, clindamycin, fexofenadine, fluticasone, and tiZANidine  History Review  Allergy: Mr. Blackshire is allergic to sulfa antibiotics. Drug: Mr. Fason  reports that he does not currently use drugs. Alcohol:  reports current alcohol use of about 18.0 standard drinks of alcohol per week. Tobacco:  reports that he has been smoking cigarettes. He has never used smokeless tobacco. Social: Mr. Hyle  reports that he has been smoking cigarettes. He has never used smokeless tobacco. He reports current alcohol use of about 18.0 standard drinks of alcohol per week. He reports that he does not currently use drugs. Medical:  has a past medical history of Arthritis, Hepatitis, Hypertension, Subarachnoid hemorrhage ([place]) (2017), and Wears hearing aid in both ears. Surgical: Mr. Gustafson  has a past surgical history that includes Brain surgery; Joint  replacement (Bilateral); Shoulder arthroscopy with rotator cuff repair and open biceps tenodesis (Left, 09/08/2018); and Hernia repair (1963). Family: family history is not on file.  Laboratory Chemistry Profile   Renal Lab Results  Component Value Date   BUN 18 09/04/2018   CREATININE 0.81 09/04/2018   GFRAA >60 09/04/2018   GFRNONAA >60 09/04/2018     Hepatic Lab Results  Component Value Date   AST 18 09/04/2018   ALT 20 09/04/2018   ALBUMIN 4.4 09/04/2018   ALKPHOS 39 09/04/2018     Electrolytes Lab Results  Component Value Date   NA 138 09/04/2018   K 3.9 09/04/2018   CL 105 09/04/2018   CALCIUM 9.2 09/04/2018     Bone No results found for: "VD25OH", "VD125OH2TOT", "OT1572IO0", "BT5974BU3", "25OHVITD1", "25OHVITD2", "25OHVITD3", "TESTOFREE", "TESTOSTERONE"   Inflammation (CRP: Acute Phase) (ESR: Chronic Phase) No results found for: "CRP", "ESRSEDRATE", "LATICACIDVEN"     Note: Above Lab results reviewed.  Recent Imaging Review  MR SHOULDER RIGHT WO CONTRAST CLINICAL DATA:  Right shoulder pain  EXAM: MRI OF THE RIGHT SHOULDER WITHOUT CONTRAST  TECHNIQUE: Multiplanar, multisequence MR imaging of the shoulder was performed. No intravenous contrast was administered.  COMPARISON:  None.  FINDINGS: Rotator cuff: There is a full-thickness, essentially full width tear of the supraspinatus tendon proximal to the footprint at the level of the sub acromial space, with approximately 1.0 cm fluid gap. There is severe tendinosis of the infraspinatus tendon, with articular and bursal sided fraying. Teres minor is intact. Severe distal subscapularis tendinosis with intermediate grade articular sided tearing near the footprint and medialization of the long head biceps tendon at the top of the bicipital groove.  Muscles: Grade 4 muscle atrophy of the supraspinatus. No other significant muscle atrophy.  Biceps Long Head: Severe tendinosis of the intra-articular long  head biceps tendon, with likely partial tearing, and medialization at the top of the bicipital groove.  Acromioclavicular Joint: Severe arthropathy of the acromioclavicular joint. Significant amount of subacromial/subdeltoid bursal fluid with synovitis, related to the cuff tear.  Glenohumeral Joint: Moderate size joint effusion with synovitis. There is fluid collecting along the subscapularis recess and subcoracoid bursa. Moderate chondrosis.  Labrum: Diffuse degenerative labral fraying with superior labral tearing at and anterior to the biceps labral anchor.  Bones: No fracture or dislocation. No aggressive osseous lesion. No additional findings.  Other: No fluid collection or hematoma.  IMPRESSION: Full-thickness, essentially full width tear of the supraspinatus tendon proximal to the footprint at the level of the subacromial space, with approximally 1.0 cm fluid gap. Grade 4 muscle atrophy of the supraspinatus.  Severe tendinosis of the infraspinatus tendon with articular and bursal sided fraying.  Severe distal subscapularis tendinosis with intermediate grade articular sided tearing at the footprint and mild medialization of the long head biceps tendon at the top of the bicipital groove. Severe tendinosis of the intra-articular LHBT with likely partial tearing.  Glenohumeral osteoarthritis with moderate chondrosis, diffuse degenerative labral fraying, and degenerative superior labral tearing at and anterior to the biceps labral anchor.  Moderate-sized joint effusion with synovitis and subacromial-subdeltoid bursitis.  Severe AC joint arthropathy.  Electronically Signed   By: Maurine Simmering M.D.   On: 12/21/2020 09:40  Note: Reviewed        CLINICAL DATA:  Right shoulder pain and limited range of motion for 6 months. No known injury.   EXAM: MRI OF THE RIGHT SHOULDER WITHOUT CONTRAST   TECHNIQUE: Multiplanar, multisequence MR imaging of the shoulder was  performed. No intravenous contrast was administered.   COMPARISON:  None.   FINDINGS: Rotator cuff: There is extensive heterogeneous increased T2 signal and thickening of the rotator cuff tendons consistent with severe tendinopathy. Tendinopathy is worst in supraspinatus where virtually no normal-appearing tendon is seen.   Muscles: Intermediate intensity edema is seen in the supraspinatus muscle belly and there is very mild atrophy of the supraspinatus.   Biceps long head: There is severe tendinopathy of both the intra and extra-articular segments, worse in the intra-articular segment.   Acromioclavicular Joint: Moderately severe degenerative change is present. Type 1 acromion. There is subacromial spurring. A large volume of fluid is seen in the subacromial/subdeltoid bursa   Glenohumeral Joint: Mild degenerative change is seen. Prominent fluid is noted in the subscapularis recess.   Labrum: The superior labrum is degenerated but no tear is identified.   Bones:  No fracture or focal lesion.   Other: None.   IMPRESSION: Severe rotator cuff tendinopathy is worst in the supraspinatus where virtually no normal-appearing tendon is identified but  no focal tear is seen. There is mild atrophy of the supraspinatus muscle belly.   Tendinopathy of the intra and extra-articular long head of biceps is severe in the intra-articular segment.   Moderately severe acromioclavicular osteoarthritis. Subacromial spurring is noted.   Large volume of subacromial/subdeltoid fluid consistent with bursitis.     Electronically Signed   By: Inge Rise M.D.   On: 11/21/2017 09:08    Physical Exam  General appearance: Well nourished, well developed, and well hydrated. In no apparent acute distress Mental status: Alert, oriented x 3 (person, place, & time)       Respiratory: No evidence of acute respiratory distress Eyes: PERLA Vitals: BP (!) 120/98   Pulse 79   Temp (!) 97.3 F  (36.3 C) (Temporal)   Resp 14   Ht 6' (1.829 m)   Wt 220 lb (99.8 kg)   SpO2 97%   BMI 29.84 kg/m  BMI: Estimated body mass index is 29.84 kg/m as calculated from the following:   Height as of this encounter: 6' (1.829 m).   Weight as of this encounter: 220 lb (99.8 kg). Ideal: Ideal body weight: 77.6 kg (171 lb 1.2 oz) Adjusted ideal body weight: 86.5 kg (190 lb 10.3 oz)   Lumbar Spine Area Exam  Skin & Axial Inspection: No masses, redness, or swelling Alignment: Symmetrical Functional ROM: Unrestricted ROM       Stability: No instability detected Muscle Tone/Strength: Functionally intact. No obvious neuro-muscular anomalies detected. Sensory (Neurological): Musculoskeletal pain pattern pain with facet loading   5 out of 5 strength bilateral lower extremity: Plantar flexion, dorsiflexion, knee flexion, knee extension.  Left heel pain  Assessment   Diagnosis  1. Chronic pain of left heel   2. Lumbar facet arthropathy   3. Lumbar degenerative disc disease   4. Chronic pain syndrome        Plan of Care   Mr. SLOANE JUNKIN has a current medication list which includes the following long-term medication(s): amlodipine, citalopram, fexofenadine, fluticasone, hydrocodone-acetaminophen, [START ON 09/10/2021] hydrocodone-acetaminophen, [START ON 10/10/2021] hydrocodone-acetaminophen, and [START ON 11/09/2021] hydrocodone-acetaminophen.   Pharmacotherapy (Medications Ordered): Meds ordered this encounter  Medications   HYDROcodone-acetaminophen (NORCO) 10-325 MG tablet    Sig: Take 1-2 tablets by mouth daily as needed. For chronic pain syndrome    Dispense:  60 tablet    Refill:  0   HYDROcodone-acetaminophen (NORCO) 10-325 MG tablet    Sig: Take 1-2 tablets by mouth daily as needed. For chronic pain syndrome    Dispense:  60 tablet    Refill:  0   HYDROcodone-acetaminophen (NORCO) 10-325 MG tablet    Sig: Take 1-2 tablets by mouth daily as needed. For chronic pain  syndrome    Dispense:  60 tablet    Refill:  0   Orders Placed This Encounter  Procedures   Ambulatory referral to Podiatry    Referral Priority:   Routine    Referral Type:   Consultation    Referral Reason:   Specialty Services Required    Referred to Provider:   Felipa Furnace, DPM    Requested Specialty:   Podiatry    Number of Visits Requested:   1     Patient encouraged to continue with physical therapy exercises for his shoulder  Follow-up plan:   Return in about 3 months (around 12/01/2021) for Medication Management, in person.     Status post bilateral L3, L4, L5, S1 facet  medial branch nerve blocks #  1 on 06/01/2019 50% pain relief for 3 days, consider repeating in future or consider sprint peripheral nerve stimulation of medial branch at L4 , s/p right glenohumeral joint injection 09/14/2020.      Recent Visits Date Type Provider Dept  06/06/21 Office Visit Gillis Santa, MD [facility]  Showing recent visits within past 90 days and meeting all other requirements Today's Visits Date Type Provider Dept  08/31/21 Office Visit Gillis Santa, MD [facility]  Showing today's visits and meeting all other requirements Future Appointments Date Type Provider Dept  11/23/21 Appointment Gillis Santa, MD [facility]  Showing future appointments within next 90 days and meeting all other requirements  I discussed the assessment and treatment plan with the patient. The patient was provided an opportunity to ask questions and all were answered. The patient agreed with the plan and demonstrated an understanding of the instructions.  Patient advised to call back or seek an in-person evaluation if the symptoms or condition worsens.  Duration of encounter: 30 minutes.  Note by: Gillis Santa, MD Date: 08/31/2021; Time: 11:55 AM

## 2021-09-09 ENCOUNTER — Other Ambulatory Visit: Payer: Self-pay | Admitting: Student in an Organized Health Care Education/Training Program

## 2021-09-09 DIAGNOSIS — G894 Chronic pain syndrome: Secondary | ICD-10-CM

## 2021-09-09 DIAGNOSIS — M5136 Other intervertebral disc degeneration, lumbar region: Secondary | ICD-10-CM

## 2021-09-09 DIAGNOSIS — M47816 Spondylosis without myelopathy or radiculopathy, lumbar region: Secondary | ICD-10-CM

## 2021-09-12 ENCOUNTER — Ambulatory Visit (INDEPENDENT_AMBULATORY_CARE_PROVIDER_SITE_OTHER): Payer: Medicare Other | Admitting: Podiatry

## 2021-09-12 ENCOUNTER — Ambulatory Visit: Payer: Medicare Other

## 2021-09-12 DIAGNOSIS — M722 Plantar fascial fibromatosis: Secondary | ICD-10-CM | POA: Diagnosis not present

## 2021-09-12 MED ORDER — METHYLPREDNISOLONE 4 MG PO TBPK: ORAL_TABLET | ORAL | 0 refills | Status: DC

## 2021-09-12 MED ORDER — MELOXICAM 15 MG PO TABS: 15.0000 mg | ORAL_TABLET | Freq: Every day | ORAL | 1 refills | Status: DC

## 2021-09-12 MED ORDER — BETAMETHASONE SOD PHOS & ACET 6 (3-3) MG/ML IJ SUSP
3.0000 mg | Freq: Once | INTRAMUSCULAR | Status: AC
  Administered 2021-09-12: 3 mg via INTRA_ARTICULAR

## 2021-09-12 NOTE — Progress Notes (Signed)
   Chief Complaint  Patient presents with   Foot Pain    Left heel pain     Subjective: 66 y.o. male presenting today as an established patient with a new complaint of left heel pain has been going on for a few months now.  Patient states that about 20 years ago he did have a history of plantar fasciitis.  Patient currently is wearing Oofos Slides which she states feel the most comfortable.  He denies a history of injury.  He has not done anything recently for treatment.   Past Medical History:  Diagnosis Date   Arthritis    Hepatitis    c 2000   Hypertension    Subarachnoid hemorrhage ([facility]) 2017   Wears hearing aid in both ears    Past Surgical History:  Procedure Laterality Date   BRAIN SURGERY     [facility] COILING 2017   HERNIA REPAIR  1963   double hernia repair   JOINT REPLACEMENT Bilateral    TKR  L-2012, R-2015   SHOULDER ARTHROSCOPY WITH ROTATOR CUFF REPAIR AND OPEN BICEPS TENODESIS Left 09/08/2018   Procedure: LEFT SHOULDER ARTHROSCOPY,SUBSACAPULARIS REPAIR, SUBACROMIAL DECOMP, DISTAL CLAVICLE EXCISION,BICEP TENODESIS, MINI OPEN REGENTEN PATCH APPLICATION;  Surgeon: Signa Kell, MD;  Location: [facility] ORS;  Service: Orthopedics;  Laterality: Left;   Allergies  Allergen Reactions   Sulfa Antibiotics Hives     Objective: Physical Exam General: The patient is alert and oriented x3 in no acute distress.  Dermatology: Skin is warm, dry and supple bilateral lower extremities. Negative for open lesions or macerations bilateral.   Vascular: Dorsalis Pedis and Posterior Tibial pulses palpable bilateral.  Capillary fill time is immediate to all digits.  Neurological: Epicritic and protective threshold intact bilateral.   Musculoskeletal: Tenderness to palpation to the plantar aspect of the left heel along the plantar fascia. All other joints range of motion within normal limits bilateral. Strength 5/5 in all groups bilateral.   Radiographic exam: Normal osseous  mineralization. Joint spaces preserved.  Plantar heel spur noted on lateral view  Assessment: 1. Plantar fasciitis left foot  Plan of Care:  1. Patient evaluated. Xrays reviewed.   2. Injection of 0.5cc Celestone soluspan injected into the left plantar fascia.  3. Rx for Medrol Dose Pak placed 4. Rx for Meloxicam ordered for patient. 5. Plantar fascial band(s) dispensed  6. Instructed patient regarding therapies and modalities at home to alleviate symptoms.  7.  Patient is currently on pain management and taking prescribed hydrocodone.   8.  Return to clinic in 4 weeks.     Felecia Shelling, DPM [facility]  Dr. Felecia Shelling, DPM    [address] [place], [place] [address]                Office [phone]  Fax [phone]

## 2021-10-09 ENCOUNTER — Other Ambulatory Visit: Payer: Self-pay | Admitting: Student in an Organized Health Care Education/Training Program

## 2021-10-09 DIAGNOSIS — M47816 Spondylosis without myelopathy or radiculopathy, lumbar region: Secondary | ICD-10-CM

## 2021-10-09 DIAGNOSIS — G894 Chronic pain syndrome: Secondary | ICD-10-CM

## 2021-10-09 DIAGNOSIS — M5136 Other intervertebral disc degeneration, lumbar region: Secondary | ICD-10-CM

## 2021-10-13 ENCOUNTER — Ambulatory Visit: Payer: Medicare Other | Admitting: Podiatry

## 2021-11-19 ENCOUNTER — Encounter: Payer: Self-pay | Admitting: Student in an Organized Health Care Education/Training Program

## 2021-11-23 ENCOUNTER — Encounter: Payer: Self-pay | Admitting: Student in an Organized Health Care Education/Training Program

## 2021-11-23 ENCOUNTER — Ambulatory Visit
Payer: Medicare Other | Attending: Student in an Organized Health Care Education/Training Program | Admitting: Student in an Organized Health Care Education/Training Program

## 2021-11-23 DIAGNOSIS — M79672 Pain in left foot: Secondary | ICD-10-CM

## 2021-11-23 DIAGNOSIS — M47816 Spondylosis without myelopathy or radiculopathy, lumbar region: Secondary | ICD-10-CM | POA: Diagnosis present

## 2021-11-23 DIAGNOSIS — M19012 Primary osteoarthritis, left shoulder: Secondary | ICD-10-CM | POA: Diagnosis present

## 2021-11-23 DIAGNOSIS — G8929 Other chronic pain: Secondary | ICD-10-CM | POA: Diagnosis present

## 2021-11-23 DIAGNOSIS — M5136 Other intervertebral disc degeneration, lumbar region: Secondary | ICD-10-CM

## 2021-11-23 DIAGNOSIS — G894 Chronic pain syndrome: Secondary | ICD-10-CM

## 2021-11-23 DIAGNOSIS — M19011 Primary osteoarthritis, right shoulder: Secondary | ICD-10-CM

## 2021-11-23 DIAGNOSIS — M51369 Other intervertebral disc degeneration, lumbar region without mention of lumbar back pain or lower extremity pain: Secondary | ICD-10-CM

## 2021-11-23 MED ORDER — HYDROCODONE-ACETAMINOPHEN 10-325 MG PO TABS: 1.0000 | ORAL_TABLET | Freq: Every day | ORAL | 0 refills | Status: DC | PRN

## 2021-11-23 NOTE — Progress Notes (Signed)
Patient: Charles Mooney  Service Category: E/M  Provider: Gillis Santa, MD  DOB: 17-Jul-1955  DOS: 11/23/2021  Location: Office  MRN: [id number]  Setting: Ambulatory outpatient  Referring Provider: Gladstone Lighter, MD  Type: Established Patient  Specialty: Interventional Pain Management  PCP: Gladstone Lighter, MD  Location: Remote location  Delivery: TeleHealth     Virtual Encounter - Pain Management PROVIDER NOTE: Information contained herein reflects review and annotations entered in association with encounter. Interpretation of such information and data should be left to medically-trained personnel. Information provided to patient can be located elsewhere in the medical record under "Patient Instructions". Document created using STT-dictation technology, any transcriptional errors that may result from process are unintentional.    Contact & Pharmacy Preferred: [phone] Home: [phone] (home) Mobile: [phone] (mobile) E-mail: [email or web address]  [address], [address] [place] [address] Phone: [phone] Fax: [phone]   Pre-screening  Charles Mooney offered "in-person" vs "virtual" encounter. He indicated preferring virtual for this encounter.   Reason COVID-19*  Social distancing based on CDC and AMA recommendations.   I contacted Charles Mooney on 11/23/2021 via telephone.      I clearly identified myself as Gillis Santa, MD. I verified that I was speaking with the correct person using two identifiers (Name: Charles Mooney, and date of birth: 05-17-1955).  Consent I sought verbal advanced consent from Charles Mooney for virtual visit interactions. I informed Charles Mooney of possible security and privacy concerns, risks, and limitations associated with providing "not-in-person" medical evaluation and management services. I also informed Charles Mooney of the availability of "in-person" appointments. Finally, I informed him that  there would be a charge for the virtual visit and that he could be  personally, fully or partially, financially responsible for it. Charles Mooney expressed understanding and agreed to proceed.   Historic Elements   Charles Mooney is a 66 y.o. year old, male patient evaluated today after our last contact on 10/09/2021. Charles Mooney  has a past medical history of Arthritis, Hepatitis, Hypertension, Subarachnoid hemorrhage ([place]) (2017), and Wears hearing aid in both ears. He also  has a past surgical history that includes Brain surgery; Joint replacement (Bilateral); Shoulder arthroscopy with rotator cuff repair and open biceps tenodesis (Left, 09/08/2018); and Hernia repair (1963). Charles Mooney has a current medication list which includes the following prescription(s): amlodipine, citalopram, clindamycin, fexofenadine, fluticasone, hydrocodone-acetaminophen, [START ON 12/09/2021] hydrocodone-acetaminophen, meloxicam, methylprednisolone, NON FORMULARY, and tizanidine. He  reports that he has been smoking cigarettes. He has never used smokeless tobacco. He reports current alcohol use of about 18.0 standard drinks of alcohol per week. He reports that he does not currently use drugs. Charles Mooney is allergic to sulfa antibiotics.   HPI  Today, he is being contacted for medication management.  Virtual visit for medication management as the patient has COVID.  Patient states that his family and him were exposed and contracted COVID on a flight back from [place]. Significant myalgias, decreased energy Did have a fall, states that it happened 90 mins after he took Tizanidine. We discussed discontinuing that. Otherwise, UDS up-to-date and appropriate.   Pharmacotherapy Assessment   Opioid Analgesic: Hydrocodone 10 mg BID prn   Monitoring: [place] PMP: PDMP reviewed during this encounter.       Pharmacotherapy: No side-effects or adverse reactions reported. Compliance: No problems identified. Effectiveness:  Clinically acceptable. Plan: Refer to "POC". UDS:  Summary  Date Value Ref Range Status  06/06/2021 Note  Final    Comment:    ==================================================================== [facility] (MW) ==================================================================== Test                             Result       Flag       Units  Drug Present and Declared for Prescription Verification   Hydrocodone                    2798         EXPECTED   ng/mg creat   Hydromorphone                  385          EXPECTED   ng/mg creat   Dihydrocodeine                 339          EXPECTED   ng/mg creat   Norhydrocodone                 2378         EXPECTED   ng/mg creat    Sources of hydrocodone include scheduled prescription medications.    Hydromorphone, dihydrocodeine and norhydrocodone are expected    metabolites of hydrocodone. Hydromorphone and dihydrocodeine are    also available as scheduled prescription medications.  ==================================================================== Test                      Result    Flag   Units      Ref Range   Creatinine              59               mg/dL      >=20 ==================================================================== Declared Medications:  The flagging and interpretation on this report are based on the  following declared medications.  Unexpected results may arise from  inaccuracies in the declared medications.   **Note: The testing scope of this panel includes these medications:   Hydrocodone (Norco)   **Note: The testing scope of this panel does not include the  following reported medications:   Acetaminophen (Norco)  Amlodipine (Norvasc)  Cannabidiol  Clindamycin (Cleocin)  Fexofenadine (Allegra)  Fluticasone (Flonase)  Tizanidine (Zanaflex)  Triamcinolone (Kenalog) ==================================================================== For clinical consultation, please call [phone]. ====================================================================    No results found for: "CBDTHCR", "D8THCCBX", "D9THCCBX"   Laboratory Chemistry Profile   Renal Lab Results  Component Value Date   BUN 18 09/04/2018   CREATININE 0.81 09/04/2018   GFRAA >60 09/04/2018   GFRNONAA >60 09/04/2018    Hepatic Lab Results  Component Value Date   AST 18 09/04/2018   ALT 20 09/04/2018   ALBUMIN 4.4 09/04/2018   ALKPHOS 39 09/04/2018    Electrolytes Lab Results  Component Value Date   NA 138 09/04/2018   K 3.9 09/04/2018   CL 105 09/04/2018   CALCIUM 9.2 09/04/2018    Bone No results found for: "VD25OH", "VD125OH2TOT", "VD3125OH2", "VD2125OH2", "25OHVITD1", "25OHVITD2", "25OHVITD3", "TESTOFREE", "TESTOSTERONE"  Inflammation (CRP: Acute Phase) (ESR: Chronic Phase) No results found for: "CRP", "ESRSEDRATE", "LATICACIDVEN"       Note: Above Lab results reviewed.  Imaging  MR SHOULDER RIGHT WO CONTRAST CLINICAL DATA:  Right shoulder pain  EXAM: MRI OF THE RIGHT SHOULDER WITHOUT CONTRAST  TECHNIQUE: Multiplanar, multisequence MR imaging of the shoulder was   performed. No intravenous contrast was administered.  COMPARISON:  None.  FINDINGS: Rotator cuff: There is a full-thickness, essentially full width tear of the supraspinatus tendon proximal to the footprint at the level of the sub acromial space, with approximately 1.0 cm fluid gap. There is severe tendinosis of the infraspinatus tendon, with articular and bursal sided fraying. Teres minor is intact. Severe distal subscapularis tendinosis with intermediate grade articular sided tearing near the footprint and medialization of the long head biceps tendon at the top of the bicipital groove.  Muscles: Grade 4 muscle atrophy of the supraspinatus. No other significant muscle atrophy.  Biceps Long Head: Severe tendinosis of the intra-articular long head biceps tendon, with likely partial tearing, and  medialization at the top of the bicipital groove.  Acromioclavicular Joint: Severe arthropathy of the acromioclavicular joint. Significant amount of subacromial/subdeltoid bursal fluid with synovitis, related to the cuff tear.  Glenohumeral Joint: Moderate size joint effusion with synovitis. There is fluid collecting along the subscapularis recess and subcoracoid bursa. Moderate chondrosis.  Labrum: Diffuse degenerative labral fraying with superior labral tearing at and anterior to the biceps labral anchor.  Bones: No fracture or dislocation. No aggressive osseous lesion. No additional findings.  Other: No fluid collection or hematoma.  IMPRESSION: Full-thickness, essentially full width tear of the supraspinatus tendon proximal to the footprint at the level of the subacromial space, with approximally 1.0 cm fluid gap. Grade 4 muscle atrophy of the supraspinatus.  Severe tendinosis of the infraspinatus tendon with articular and bursal sided fraying.  Severe distal subscapularis tendinosis with intermediate grade articular sided tearing at the footprint and mild medialization of the long head biceps tendon at the top of the bicipital groove. Severe tendinosis of the intra-articular LHBT with likely partial tearing.  Glenohumeral osteoarthritis with moderate chondrosis, diffuse degenerative labral fraying, and degenerative superior labral tearing at and anterior to the biceps labral anchor.  Moderate-sized joint effusion with synovitis and subacromial-subdeltoid bursitis.  Severe AC joint arthropathy.  Electronically Signed   By: Maurine Simmering M.D.   On: 12/21/2020 09:40  Assessment  The primary encounter diagnosis was Chronic pain of left heel. Diagnoses of Lumbar facet arthropathy, Lumbar degenerative disc disease, Arthritis of right shoulder region, Localized osteoarthritis of shoulder regions, bilateral, and Chronic pain syndrome were also pertinent to this  visit.  Plan of Care   Mr. ROWE WARMAN has a current medication list which includes the following long-term medication(s): amlodipine, citalopram, fexofenadine, fluticasone, hydrocodone-acetaminophen, and [START ON 12/09/2021] hydrocodone-acetaminophen.  Pharmacotherapy (Medications Ordered): Meds ordered this encounter  Medications   HYDROcodone-acetaminophen (NORCO) 10-325 MG tablet    Sig: Take 1-2 tablets by mouth daily as needed. For chronic pain syndrome    Dispense:  60 tablet    Refill:  0    Follow-up plan:   Return in about 6 weeks (around 01/04/2022) for Medication Management, in person.     Status post bilateral L3, L4, L5, S1 facet  medial branch nerve blocks #1 on 06/01/2019 50% pain relief for 3 days, consider repeating in future or consider sprint peripheral nerve stimulation of medial branch at L4 , s/p right glenohumeral joint injection 09/14/2020.       Recent Visits Date Type Provider Dept  08/31/21 Office Visit Gillis Santa, MD [facility]  Showing recent visits within past 90 days and meeting all other requirements Today's Visits Date Type Provider Dept  11/23/21 Office Visit Gillis Santa, MD [facility]  Showing today's visits and meeting all other  requirements Future Appointments No visits were found meeting these conditions. Showing future appointments within next 90 days and meeting all other requirements  I discussed the assessment and treatment plan with the patient. The patient was provided an opportunity to ask questions and all were answered. The patient agreed with the plan and demonstrated an understanding of the instructions.  Patient advised to call back or seek an in-person evaluation if the symptoms or condition worsens.  Duration of encounter: 30 minutes.  Note by: Bilal Lateef, MD Date: 11/23/2021; Time: 10:28 AM 

## 2022-01-04 ENCOUNTER — Encounter: Payer: Self-pay | Admitting: Student in an Organized Health Care Education/Training Program

## 2022-01-04 ENCOUNTER — Ambulatory Visit
Payer: Medicare Other | Attending: Student in an Organized Health Care Education/Training Program | Admitting: Student in an Organized Health Care Education/Training Program

## 2022-01-04 VITALS — BP 135/88 | HR 67 | Temp 97.3°F | Ht 72.0 in | Wt 220.0 lb

## 2022-01-04 DIAGNOSIS — M19011 Primary osteoarthritis, right shoulder: Secondary | ICD-10-CM | POA: Diagnosis not present

## 2022-01-04 DIAGNOSIS — G588 Other specified mononeuropathies: Secondary | ICD-10-CM | POA: Insufficient documentation

## 2022-01-04 DIAGNOSIS — M47816 Spondylosis without myelopathy or radiculopathy, lumbar region: Secondary | ICD-10-CM | POA: Diagnosis not present

## 2022-01-04 DIAGNOSIS — M5136 Other intervertebral disc degeneration, lumbar region: Secondary | ICD-10-CM | POA: Diagnosis not present

## 2022-01-04 DIAGNOSIS — G894 Chronic pain syndrome: Secondary | ICD-10-CM | POA: Diagnosis present

## 2022-01-04 DIAGNOSIS — M19012 Primary osteoarthritis, left shoulder: Secondary | ICD-10-CM | POA: Diagnosis present

## 2022-01-04 MED ORDER — CYCLOBENZAPRINE HCL 10 MG PO TABS: 10.0000 mg | ORAL_TABLET | Freq: Every day | ORAL | 0 refills | Status: DC

## 2022-01-04 MED ORDER — OXYCODONE-ACETAMINOPHEN 7.5-325 MG PO TABS: 1.0000 | ORAL_TABLET | Freq: Three times a day (TID) | ORAL | 0 refills | Status: AC | PRN

## 2022-01-04 MED ORDER — OXYCODONE-ACETAMINOPHEN 7.5-325 MG PO TABS: 1.0000 | ORAL_TABLET | Freq: Three times a day (TID) | ORAL | 0 refills | Status: DC | PRN

## 2022-01-04 NOTE — Progress Notes (Signed)
PROVIDER NOTE: Information contained herein reflects review and annotations entered in association with encounter. Interpretation of such information and data should be left to medically-trained personnel. Information provided to patient can be located elsewhere in the medical record under "Patient Instructions". Document created using STT-dictation technology, any transcriptional errors that may result from process are unintentional.    Patient: Charles Mooney  Service Category: E/M  Provider: Gillis Santa, MD  DOB: 09/27/1955  DOS: 01/04/2022  Specialty: Interventional Pain Management  MRN: [id number]  Setting: Ambulatory outpatient  PCP: Gladstone Lighter, MD  Type: Established Patient    Referring Provider: Gladstone Lighter, MD  Location: Office  Delivery: Face-to-face     HPI  [name], a 66 y.o. 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 Mr. Horrigan's primary complain today is Other (Rib cage)  Last encounter: My last encounter with him was on 11/23/21  Pertinent problems: Charles Mooney has Chronic bilateral low back pain with bilateral sciatica; Acute bursitis of right shoulder; Nonallopathic lesion of sacral region; Lumbar facet arthropathy; Lumbar degenerative disc disease; Localized osteoarthritis of shoulder regions, bilateral; Encounter for long-term opiate analgesic use; Chronic SI joint pain; and Chronic pain syndrome on their pertinent problem list. Pain Assessment: Severity of Chronic pain is reported as a 7 /10. Location: Rib cage Right/Debnies. Onset: More than a month ago. Quality: Aching, Throbbing, Sharp, Stabbing. Timing: Constant. Modifying factor(s): Meds and laying down. Vitals:  height is 6' (1.829 m) and weight is 220 lb (99.8 kg). His temperature is 97.3 F (36.3 C) (abnormal). His blood pressure is 135/88 and his pulse is 67. His oxygen saturation is 99%.   Reason for encounter: medication management.    [name] presents today for  medication management. He is having increased right intercostal pain which he states got much worse after COVID. He states that the hydrocodone resulted in insomnia.  He would like to transition to oxycodone.  He also states that the tizanidine is not as effective anymore and would like to consider another muscle relaxer.  Pharmacotherapy Assessment  Analgesic: Hydrocodone 10 mg BID prn   Monitoring: [place] PMP: PDMP reviewed during this encounter.       Pharmacotherapy: No side-effects or adverse reactions reported. Compliance: No problems identified. Effectiveness: Clinically acceptable.  UDS:  Summary  Date Value Ref Range Status  06/06/2021 Note  Final    Comment:    ==================================================================== [facility] (MW) ==================================================================== Test                             Result       Flag       Units  Drug Present and Declared for Prescription Verification   Hydrocodone                    2798         EXPECTED   ng/mg creat   Hydromorphone                  385          EXPECTED   ng/mg creat   Dihydrocodeine                 339          EXPECTED   ng/mg creat   Norhydrocodone                 2378  EXPECTED   ng/mg creat    Sources of hydrocodone include scheduled prescription medications.    Hydromorphone, dihydrocodeine and norhydrocodone are expected    metabolites of hydrocodone. Hydromorphone and dihydrocodeine are    also available as scheduled prescription medications.  ==================================================================== Test                      Result    Flag   Units      Ref Range   Creatinine              59               mg/dL      >=20 ==================================================================== Declared Medications:  The flagging and interpretation on this report are based on the  following declared medications.  Unexpected results may arise from   inaccuracies in the declared medications.   **Note: The testing scope of this panel includes these medications:   Hydrocodone (Norco)   **Note: The testing scope of this panel does not include the  following reported medications:   Acetaminophen (Norco)  Amlodipine (Norvasc)  Cannabidiol  Clindamycin (Cleocin)  Fexofenadine (Allegra)  Fluticasone (Flonase)  Tizanidine (Zanaflex)  Triamcinolone (Kenalog) ==================================================================== For clinical consultation, please call [phone]. ====================================================================       ROS  Constitutional: Denies any fever or chills Gastrointestinal: No reported hemesis, hematochezia, vomiting, or acute GI distress Musculoskeletal:  Right intercostal pain Neurological: No reported episodes of acute onset apraxia, aphasia, dysarthria, agnosia, amnesia, paralysis, loss of coordination, or loss of consciousness  Medication Review  NON FORMULARY, amLODipine, citalopram, cyclobenzaprine, fexofenadine, fluticasone, and oxyCODONE-acetaminophen  History Review  Allergy: Mr. Bernabei is allergic to sulfa antibiotics. Drug: Mr. Tallarico  reports that he does not currently use drugs. Alcohol:  reports current alcohol use of about 18.0 standard drinks of alcohol per week. Tobacco:  reports that he has been smoking cigarettes. He has never used smokeless tobacco. Social: Charles Mooney  reports that he has been smoking cigarettes. He has never used smokeless tobacco. He reports current alcohol use of about 18.0 standard drinks of alcohol per week. He reports that he does not currently use drugs. Medical:  has a past medical history of Arthritis, Hepatitis, Hypertension, Subarachnoid hemorrhage ([place]) (2017), and Wears hearing aid in both ears. Surgical: Charles Mooney  has a past surgical history that includes Brain surgery; Joint replacement (Bilateral); Shoulder arthroscopy with  rotator cuff repair and open biceps tenodesis (Left, 09/08/2018); and Hernia repair (1963). Family: family history is not on file.  Laboratory Chemistry Profile   Renal Lab Results  Component Value Date   BUN 18 09/04/2018   CREATININE 0.81 09/04/2018   GFRAA >60 09/04/2018   GFRNONAA >60 09/04/2018     Hepatic Lab Results  Component Value Date   AST 18 09/04/2018   ALT 20 09/04/2018   ALBUMIN 4.4 09/04/2018   ALKPHOS 39 09/04/2018     Electrolytes Lab Results  Component Value Date   NA 138 09/04/2018   K 3.9 09/04/2018   CL 105 09/04/2018   CALCIUM 9.2 09/04/2018     Bone No results found for: "VD25OH", "VD125OH2TOT", "TI1443XV4", "MG8676PP5", "25OHVITD1", "25OHVITD2", "25OHVITD3", "TESTOFREE", "TESTOSTERONE"   Inflammation (CRP: Acute Phase) (ESR: Chronic Phase) No results found for: "CRP", "ESRSEDRATE", "LATICACIDVEN"     Note: Above Lab results reviewed.  Recent Imaging Review  MR SHOULDER RIGHT WO CONTRAST CLINICAL DATA:  Right shoulder pain  EXAM: MRI OF THE RIGHT SHOULDER WITHOUT CONTRAST  TECHNIQUE: Multiplanar, multisequence MR imaging of the shoulder was performed. No intravenous contrast was administered.  COMPARISON:  None.  FINDINGS: Rotator cuff: There is a full-thickness, essentially full width tear of the supraspinatus tendon proximal to the footprint at the level of the sub acromial space, with approximately 1.0 cm fluid gap. There is severe tendinosis of the infraspinatus tendon, with articular and bursal sided fraying. Teres minor is intact. Severe distal subscapularis tendinosis with intermediate grade articular sided tearing near the footprint and medialization of the long head biceps tendon at the top of the bicipital groove.  Muscles: Grade 4 muscle atrophy of the supraspinatus. No other significant muscle atrophy.  Biceps Long Head: Severe tendinosis of the intra-articular long head biceps tendon, with likely partial tearing,  and medialization at the top of the bicipital groove.  Acromioclavicular Joint: Severe arthropathy of the acromioclavicular joint. Significant amount of subacromial/subdeltoid bursal fluid with synovitis, related to the cuff tear.  Glenohumeral Joint: Moderate size joint effusion with synovitis. There is fluid collecting along the subscapularis recess and subcoracoid bursa. Moderate chondrosis.  Labrum: Diffuse degenerative labral fraying with superior labral tearing at and anterior to the biceps labral anchor.  Bones: No fracture or dislocation. No aggressive osseous lesion. No additional findings.  Other: No fluid collection or hematoma.  IMPRESSION: Full-thickness, essentially full width tear of the supraspinatus tendon proximal to the footprint at the level of the subacromial space, with approximally 1.0 cm fluid gap. Grade 4 muscle atrophy of the supraspinatus.  Severe tendinosis of the infraspinatus tendon with articular and bursal sided fraying.  Severe distal subscapularis tendinosis with intermediate grade articular sided tearing at the footprint and mild medialization of the long head biceps tendon at the top of the bicipital groove. Severe tendinosis of the intra-articular LHBT with likely partial tearing.  Glenohumeral osteoarthritis with moderate chondrosis, diffuse degenerative labral fraying, and degenerative superior labral tearing at and anterior to the biceps labral anchor.  Moderate-sized joint effusion with synovitis and subacromial-subdeltoid bursitis.  Severe AC joint arthropathy.  Electronically Signed   By: Maurine Simmering M.D.   On: 12/21/2020 09:40  Note: Reviewed        CLINICAL DATA:  Right shoulder pain and limited range of motion for 6 months. No known injury.   EXAM: MRI OF THE RIGHT SHOULDER WITHOUT CONTRAST   TECHNIQUE: Multiplanar, multisequence MR imaging of the shoulder was performed. No intravenous contrast was administered.    COMPARISON:  None.   FINDINGS: Rotator cuff: There is extensive heterogeneous increased T2 signal and thickening of the rotator cuff tendons consistent with severe tendinopathy. Tendinopathy is worst in supraspinatus where virtually no normal-appearing tendon is seen.   Muscles: Intermediate intensity edema is seen in the supraspinatus muscle belly and there is very mild atrophy of the supraspinatus.   Biceps long head: There is severe tendinopathy of both the intra and extra-articular segments, worse in the intra-articular segment.   Acromioclavicular Joint: Moderately severe degenerative change is present. Type 1 acromion. There is subacromial spurring. A large volume of fluid is seen in the subacromial/subdeltoid bursa   Glenohumeral Joint: Mild degenerative change is seen. Prominent fluid is noted in the subscapularis recess.   Labrum: The superior labrum is degenerated but no tear is identified.   Bones:  No fracture or focal lesion.   Other: None.   IMPRESSION: Severe rotator cuff tendinopathy is worst in the supraspinatus where virtually no normal-appearing tendon is identified but no focal tear is seen. There is mild atrophy  of the supraspinatus muscle belly.   Tendinopathy of the intra and extra-articular long head of biceps is severe in the intra-articular segment.   Moderately severe acromioclavicular osteoarthritis. Subacromial spurring is noted.   Large volume of subacromial/subdeltoid fluid consistent with bursitis.     Electronically Signed   By: Inge Rise M.D.   On: 11/21/2017 09:08    Physical Exam  General appearance: Well nourished, well developed, and well hydrated. In no apparent acute distress Mental status: Alert, oriented x 3 (person, place, & time)       Respiratory: No evidence of acute respiratory distress Eyes: PERLA Vitals: BP 135/88   Pulse 67   Temp (!) 97.3 F (36.3 C)   Ht 6' (1.829 m)   Wt 220 lb (99.8 kg)   SpO2  99%   BMI 29.84 kg/m  BMI: Estimated body mass index is 29.84 kg/m as calculated from the following:   Height as of this encounter: 6' (1.829 m).   Weight as of this encounter: 220 lb (99.8 kg). Ideal: Ideal body weight: 77.6 kg (171 lb 1.2 oz) Adjusted ideal body weight: 86.5 kg (190 lb 10.3 oz)  Right intercostal pain with radiation to mid abdomen  Lumbar Spine Area Exam  Skin & Axial Inspection: No masses, redness, or swelling Alignment: Symmetrical Functional ROM: Unrestricted ROM       Stability: No instability detected Muscle Tone/Strength: Functionally intact. No obvious neuro-muscular anomalies detected. Sensory (Neurological): Musculoskeletal pain pattern pain with facet loading   5 out of 5 strength bilateral lower extremity: Plantar flexion, dorsiflexion, knee flexion, knee extension.    Assessment   Diagnosis  1. Intercostal neuralgia (R)   2. Lumbar facet arthropathy   3. Lumbar degenerative disc disease   4. Arthritis of right shoulder region   5. Localized osteoarthritis of shoulder regions, bilateral   6. Chronic pain syndrome         Plan of Care   Mr. MAXDEN NAJI has a current medication list which includes the following long-term medication(s): amlodipine, citalopram, fexofenadine, and fluticasone.  Transition to oxycodone as below.  This will not be a long-term prescription.  We are trying this to help manage his acute on chronic right intercostal pain.  We discussed right diagnostic intercostal nerve block if his pain does not improve over the next 4 weeks.  Discontinue tizanidine and start Flexeril 10 mg nightly as needed as below.  Pharmacotherapy (Medications Ordered): Meds ordered this encounter  Medications   oxyCODONE-acetaminophen (PERCOCET) 7.5-325 MG tablet    Sig: Take 1 tablet by mouth every 8 (eight) hours as needed for moderate pain or severe pain. Must last 30 days.    Dispense:  90 tablet    Refill:  0    Chronic Pain: STOP  Act (Not applicable) Fill 1 day early if closed on refill date. Avoid benzodiazepines within 8 hours of opioids   oxyCODONE-acetaminophen (PERCOCET) 7.5-325 MG tablet    Sig: Take 1 tablet by mouth every 8 (eight) hours as needed for moderate pain or severe pain. Must last 30 days.    Dispense:  90 tablet    Refill:  0    Chronic Pain: STOP Act (Not applicable) Fill 1 day early if closed on refill date. Avoid benzodiazepines within 8 hours of opioids   cyclobenzaprine (FLEXERIL) 10 MG tablet    Sig: Take 1 tablet (10 mg total) by mouth at bedtime.    Dispense:  90 tablet    Refill:  0    Do not place this medication, or any other prescription from our practice, on "Automatic Refill". Patient may have prescription filled one day early if pharmacy is closed on scheduled refill date.   No orders of the defined types were placed in this encounter.     Follow-up plan:   Return in about 2 months (around 03/06/2022) for Medication Management, in person.     Status post bilateral L3, L4, L5, S1 facet  medial branch nerve blocks #1 on 06/01/2019 50% pain relief for 3 days, consider repeating in future or consider sprint peripheral nerve stimulation of medial branch at L4 , s/p right glenohumeral joint injection 09/14/2020.      Recent Visits Date Type Provider Dept  11/23/21 Office Visit Gillis Santa, MD [facility]  Showing recent visits within past 90 days and meeting all other requirements Today's Visits Date Type Provider Dept  01/04/22 Office Visit Gillis Santa, MD [facility]  Showing today's visits and meeting all other requirements Future Appointments Date Type Provider Dept  03/01/22 Appointment Gillis Santa, MD [facility]  Showing future appointments within next 90 days and meeting all other requirements  I discussed the assessment and treatment plan with the patient. The patient was provided an opportunity to ask questions and all were answered. The  patient agreed with the plan and demonstrated an understanding of the instructions.  Patient advised to call back or seek an in-person evaluation if the symptoms or condition worsens.  Duration of encounter: 30 minutes.  Note by: Gillis Santa, MD Date: 01/04/2022; Time: 9:41 AM

## 2022-01-04 NOTE — Progress Notes (Signed)
Nursing Pain Medication Assessment:  Safety precautions to be maintained throughout the outpatient stay will include: orient to surroundings, keep bed in low position, maintain call bell within reach at all times, provide assistance with transfer out of bed and ambulation.  Medication Inspection Compliance: Pill count conducted under aseptic conditions, in front of the patient. Neither the pills nor the bottle was removed from the patient's sight at any time. Once count was completed pills were immediately returned to the patient in their original bottle.  Medication: Hydrocodone/APAP Pill/Patch Count:  5 of 60 pills remain Pill/Patch Appearance: Markings consistent with prescribed medication Bottle Appearance: Standard pharmacy container. Clearly labeled. Filled Date: 18 / 21 / 2023 Last Medication intake:  YesterdaySafety precautions to be maintained throughout the outpatient stay will include: orient to surroundings, keep bed in low position, maintain call bell within reach at all times, provide assistance with transfer out of bed and ambulation.

## 2022-03-01 ENCOUNTER — Ambulatory Visit
Payer: Medicare Other | Attending: Student in an Organized Health Care Education/Training Program | Admitting: Student in an Organized Health Care Education/Training Program

## 2022-03-01 ENCOUNTER — Encounter: Payer: Self-pay | Admitting: Student in an Organized Health Care Education/Training Program

## 2022-03-01 VITALS — BP 144/95 | HR 84 | Temp 97.2°F | Ht 72.0 in | Wt 218.0 lb

## 2022-03-01 DIAGNOSIS — Z79891 Long term (current) use of opiate analgesic: Secondary | ICD-10-CM | POA: Insufficient documentation

## 2022-03-01 DIAGNOSIS — M533 Sacrococcygeal disorders, not elsewhere classified: Secondary | ICD-10-CM

## 2022-03-01 DIAGNOSIS — G894 Chronic pain syndrome: Secondary | ICD-10-CM | POA: Diagnosis present

## 2022-03-01 DIAGNOSIS — G8929 Other chronic pain: Secondary | ICD-10-CM | POA: Insufficient documentation

## 2022-03-01 DIAGNOSIS — M47816 Spondylosis without myelopathy or radiculopathy, lumbar region: Secondary | ICD-10-CM | POA: Diagnosis present

## 2022-03-01 DIAGNOSIS — M5136 Other intervertebral disc degeneration, lumbar region: Secondary | ICD-10-CM | POA: Insufficient documentation

## 2022-03-01 MED ORDER — OXYCODONE-ACETAMINOPHEN 7.5-325 MG PO TABS: 1.0000 | ORAL_TABLET | Freq: Three times a day (TID) | ORAL | 0 refills | Status: AC | PRN

## 2022-03-01 MED ORDER — OXYCODONE-ACETAMINOPHEN 7.5-325 MG PO TABS: 1.0000 | ORAL_TABLET | Freq: Three times a day (TID) | ORAL | 0 refills | Status: DC | PRN

## 2022-03-01 NOTE — Progress Notes (Signed)
PROVIDER NOTE: Information contained herein reflects review and annotations entered in association with encounter. Interpretation of such information and data should be left to medically-trained personnel. Information provided to patient can be located elsewhere in the medical record under "Patient Instructions". Document created using STT-dictation technology, any transcriptional errors that may result from process are unintentional.    Patient: Charles Mooney  Service Category: E/M  Provider: Gillis Santa, MD  DOB: November 22, 1955  DOS: 03/01/2022  Specialty: Interventional Pain Management  MRN: [id number]  Setting: Ambulatory outpatient  PCP: Gladstone Lighter, MD  Type: Established Patient    Referring Provider: Gladstone Lighter, MD  Location: Office  Delivery: Face-to-face     HPI  Charles Mooney, a 67 y.o. year old male, is here today because of his Lumbar facet arthropathy [M47.816]. Mr. Newkirk's primary complain today is Back Pain (Lower mostly on left)  Last encounter: My last encounter with him was on 01/04/22  Pertinent problems: Charles Mooney has Chronic bilateral low back pain with bilateral sciatica; Acute bursitis of right shoulder; Nonallopathic lesion of sacral region; Lumbar facet arthropathy; Lumbar degenerative disc disease; Localized osteoarthritis of shoulder regions, bilateral; Encounter for long-term opiate analgesic use; Chronic SI joint pain; and Chronic pain syndrome on their pertinent problem list. Pain Assessment: Severity of Chronic pain is reported as a 5 /10. Location: Back Left, Lower/denies. Onset: More than a month ago. Quality: Burning, Sharp, Dull, Throbbing, Constant. Timing: Constant. Modifying factor(s): stretching. Vitals:  height is 6' (1.829 m) and weight is 218 lb (98.9 kg). His temporal temperature is 97.2 F (36.2 C) (abnormal). His blood pressure is 144/95 (abnormal) and his pulse is 84. His oxygen saturation is 98%.   Reason for encounter: medication  management.    No change in medical history since last visit.  Patient's pain is at baseline.  Patient continues multimodal pain regimen as prescribed.  States that it provides pain relief and improvement in functional status. Right intercostal pain and right shoulder pain have significantly improved.  He only endorses left lower back pain today.  Pharmacotherapy Assessment  Analgesic:  Percocet 7.5 mg TID prn   Monitoring: [place] PMP: PDMP reviewed during this encounter.       Pharmacotherapy: No side-effects or adverse reactions reported. Compliance: No problems identified. Effectiveness: Clinically acceptable.  UDS:  Summary  Date Value Ref Range Status  06/06/2021 Note  Final    Comment:    ==================================================================== [employer] (MW) ==================================================================== Test                             Result       Flag       Units  Drug Present and Declared for Prescription Verification   Hydrocodone                    2798         EXPECTED   ng/mg creat   Hydromorphone                  385          EXPECTED   ng/mg creat   Dihydrocodeine                 339          EXPECTED   ng/mg creat   Norhydrocodone                 2378  EXPECTED   ng/mg creat    Sources of hydrocodone include scheduled prescription medications.    Hydromorphone, dihydrocodeine and norhydrocodone are expected    metabolites of hydrocodone. Hydromorphone and dihydrocodeine are    also available as scheduled prescription medications.  ==================================================================== Test                      Result    Flag   Units      Ref Range   Creatinine              59               mg/dL      >=78 ==================================================================== Declared Medications:  The flagging and interpretation on this report are based on the  following declared medications.   Unexpected results may arise from  inaccuracies in the declared medications.   **Note: The testing scope of this panel includes these medications:   Hydrocodone (Norco)   **Note: The testing scope of this panel does not include the  following reported medications:   Acetaminophen (Norco)  Amlodipine (Norvasc)  Cannabidiol  Clindamycin (Cleocin)  Fexofenadine (Allegra)  Fluticasone (Flonase)  Tizanidine (Zanaflex)  Triamcinolone (Kenalog) ==================================================================== For clinical consultation, please call [phone]. ====================================================================       ROS  Constitutional: Denies any fever or chills Gastrointestinal: No reported hemesis, hematochezia, vomiting, or acute GI distress Musculoskeletal:  LBP, left > right Neurological: No reported episodes of acute onset apraxia, aphasia, dysarthria, agnosia, amnesia, paralysis, loss of coordination, or loss of consciousness  Medication Review  NON FORMULARY, amLODipine, citalopram, cyclobenzaprine, fexofenadine, fluticasone, oxyCODONE-acetaminophen, and traZODone  History Review  Allergy: Charles Mooney is allergic to sulfa antibiotics. Drug: Charles Mooney  reports that he does not currently use drugs. Alcohol:  reports current alcohol use of about 18.0 standard drinks of alcohol per week. Tobacco:  reports that he has been smoking cigarettes. He has never used smokeless tobacco. Social: Charles Mooney  reports that he has been smoking cigarettes. He has never used smokeless tobacco. He reports current alcohol use of about 18.0 standard drinks of alcohol per week. He reports that he does not currently use drugs. Medical:  has a past medical history of Arthritis, Hepatitis, Hypertension, Subarachnoid hemorrhage (HCC) (2017), and Wears hearing aid in both ears. Surgical: Charles Mooney  has a past surgical history that includes Brain surgery; Joint replacement  (Bilateral); Shoulder arthroscopy with rotator cuff repair and open biceps tenodesis (Left, 09/08/2018); and Hernia repair (1963). Family: family history is not on file.  Laboratory Chemistry Profile   Renal Lab Results  Component Value Date   BUN 18 09/04/2018   CREATININE 0.81 09/04/2018   GFRAA >60 09/04/2018   GFRNONAA >60 09/04/2018     Hepatic Lab Results  Component Value Date   AST 18 09/04/2018   ALT 20 09/04/2018   ALBUMIN 4.4 09/04/2018   ALKPHOS 39 09/04/2018     Electrolytes Lab Results  Component Value Date   NA 138 09/04/2018   K 3.9 09/04/2018   CL 105 09/04/2018   CALCIUM 9.2 09/04/2018     Bone No results found for: "VD25OH", "VD125OH2TOT", "VH8469GE9", "BM8413KG4", "25OHVITD1", "25OHVITD2", "25OHVITD3", "TESTOFREE", "TESTOSTERONE"   Inflammation (CRP: Acute Phase) (ESR: Chronic Phase) No results found for: "CRP", "ESRSEDRATE", "LATICACIDVEN"     Note: Above Lab results reviewed.  Recent Imaging Review  MR SHOULDER RIGHT WO CONTRAST CLINICAL DATA:  Right shoulder pain  EXAM: MRI OF THE RIGHT SHOULDER WITHOUT  CONTRAST  TECHNIQUE: Multiplanar, multisequence MR imaging of the shoulder was performed. No intravenous contrast was administered.  COMPARISON:  None.  FINDINGS: Rotator cuff: There is a full-thickness, essentially full width tear of the supraspinatus tendon proximal to the footprint at the level of the sub acromial space, with approximately 1.0 cm fluid gap. There is severe tendinosis of the infraspinatus tendon, with articular and bursal sided fraying. Teres minor is intact. Severe distal subscapularis tendinosis with intermediate grade articular sided tearing near the footprint and medialization of the long head biceps tendon at the top of the bicipital groove.  Muscles: Grade 4 muscle atrophy of the supraspinatus. No other significant muscle atrophy.  Biceps Long Head: Severe tendinosis of the intra-articular long head biceps  tendon, with likely partial tearing, and medialization at the top of the bicipital groove.  Acromioclavicular Joint: Severe arthropathy of the acromioclavicular joint. Significant amount of subacromial/subdeltoid bursal fluid with synovitis, related to the cuff tear.  Glenohumeral Joint: Moderate size joint effusion with synovitis. There is fluid collecting along the subscapularis recess and subcoracoid bursa. Moderate chondrosis.  Labrum: Diffuse degenerative labral fraying with superior labral tearing at and anterior to the biceps labral anchor.  Bones: No fracture or dislocation. No aggressive osseous lesion. No additional findings.  Other: No fluid collection or hematoma.  IMPRESSION: Full-thickness, essentially full width tear of the supraspinatus tendon proximal to the footprint at the level of the subacromial space, with approximally 1.0 cm fluid gap. Grade 4 muscle atrophy of the supraspinatus.  Severe tendinosis of the infraspinatus tendon with articular and bursal sided fraying.  Severe distal subscapularis tendinosis with intermediate grade articular sided tearing at the footprint and mild medialization of the long head biceps tendon at the top of the bicipital groove. Severe tendinosis of the intra-articular LHBT with likely partial tearing.  Glenohumeral osteoarthritis with moderate chondrosis, diffuse degenerative labral fraying, and degenerative superior labral tearing at and anterior to the biceps labral anchor.  Moderate-sized joint effusion with synovitis and subacromial-subdeltoid bursitis.  Severe AC joint arthropathy.  Electronically Signed   By: Caprice Renshaw M.D.   On: 12/21/2020 09:40  Note: Reviewed        CLINICAL DATA:  Right shoulder pain and limited range of motion for 6 months. No known injury.   EXAM: MRI OF THE RIGHT SHOULDER WITHOUT CONTRAST   TECHNIQUE: Multiplanar, multisequence MR imaging of the shoulder was performed. No  intravenous contrast was administered.   COMPARISON:  None.   FINDINGS: Rotator cuff: There is extensive heterogeneous increased T2 signal and thickening of the rotator cuff tendons consistent with severe tendinopathy. Tendinopathy is worst in supraspinatus where virtually no normal-appearing tendon is seen.   Muscles: Intermediate intensity edema is seen in the supraspinatus muscle belly and there is very mild atrophy of the supraspinatus.   Biceps long head: There is severe tendinopathy of both the intra and extra-articular segments, worse in the intra-articular segment.   Acromioclavicular Joint: Moderately severe degenerative change is present. Type 1 acromion. There is subacromial spurring. A large volume of fluid is seen in the subacromial/subdeltoid bursa   Glenohumeral Joint: Mild degenerative change is seen. Prominent fluid is noted in the subscapularis recess.   Labrum: The superior labrum is degenerated but no tear is identified.   Bones:  No fracture or focal lesion.   Other: None.   IMPRESSION: Severe rotator cuff tendinopathy is worst in the supraspinatus where virtually no normal-appearing tendon is identified but no focal tear is seen. There is  mild atrophy of the supraspinatus muscle belly.   Tendinopathy of the intra and extra-articular long head of biceps is severe in the intra-articular segment.   Moderately severe acromioclavicular osteoarthritis. Subacromial spurring is noted.   Large volume of subacromial/subdeltoid fluid consistent with bursitis.     Electronically Signed   By: Inge Rise M.D.   On: 11/21/2017 09:08    Physical Exam  General appearance: Well nourished, well developed, and well hydrated. In no apparent acute distress Mental status: Alert, oriented x 3 (person, place, & time)       Respiratory: No evidence of acute respiratory distress Eyes: PERLA Vitals: BP (!) 144/95 (BP Location: Left Arm, Patient Position:  Sitting, Cuff Size: Large)   Pulse 84   Temp (!) 97.2 F (36.2 C) (Temporal)   Ht 6' (1.829 m)   Wt 218 lb (98.9 kg)   SpO2 98%   BMI 29.57 kg/m  BMI: Estimated body mass index is 29.57 kg/m as calculated from the following:   Height as of this encounter: 6' (1.829 m).   Weight as of this encounter: 218 lb (98.9 kg). Ideal: Ideal body weight: 77.6 kg (171 lb 1.2 oz) Adjusted ideal body weight: 86.1 kg (189 lb 13.5 oz)  Lumbar Spine Area Exam  Skin & Axial Inspection: No masses, redness, or swelling Alignment: Symmetrical Functional ROM: Unrestricted ROM       Stability: No instability detected Muscle Tone/Strength: Functionally intact. No obvious neuro-muscular anomalies detected. Sensory (Neurological): Musculoskeletal pain pattern pain with facet loading, Left > right   5 out of 5 strength bilateral lower extremity: Plantar flexion, dorsiflexion, knee flexion, knee extension.    Assessment   Diagnosis  1. Lumbar facet arthropathy   2. Lumbar degenerative disc disease   3. Chronic SI joint pain   4. Encounter for long-term opiate analgesic use   5. Chronic pain syndrome      Plan of Care   Mr. YOUSOF ALDERMAN has a current medication list which includes the following long-term medication(s): amlodipine, citalopram, trazodone, fexofenadine, and fluticasone.   Pharmacotherapy (Medications Ordered): Meds ordered this encounter  Medications   oxyCODONE-acetaminophen (PERCOCET) 7.5-325 MG tablet    Sig: Take 1 tablet by mouth every 8 (eight) hours as needed for moderate pain or severe pain. Must last 30 days.    Dispense:  90 tablet    Refill:  0    Chronic Pain: STOP Act (Not applicable) Fill 1 day early if closed on refill date. Avoid benzodiazepines within 8 hours of opioids   oxyCODONE-acetaminophen (PERCOCET) 7.5-325 MG tablet    Sig: Take 1 tablet by mouth every 8 (eight) hours as needed for moderate pain or severe pain. Must last 30 days.    Dispense:  90  tablet    Refill:  0    Chronic Pain: STOP Act (Not applicable) Fill 1 day early if closed on refill date. Avoid benzodiazepines within 8 hours of opioids   oxyCODONE-acetaminophen (PERCOCET) 7.5-325 MG tablet    Sig: Take 1 tablet by mouth every 8 (eight) hours as needed for moderate pain or severe pain. Must last 30 days.    Dispense:  90 tablet    Refill:  0    Chronic Pain: STOP Act (Not applicable) Fill 1 day early if closed on refill date. Avoid benzodiazepines within 8 hours of opioids   No orders of the defined types were placed in this encounter.   Follow-up plan:   Return in about 3 months (  around 05/31/2022) for Medication Management, in person.     Status post bilateral L3, L4, L5, S1 facet  medial branch nerve blocks #1 on 06/01/2019 50% pain relief for 3 days, consider repeating in future or consider sprint peripheral nerve stimulation of medial branch at L4 , s/p right glenohumeral joint injection 09/14/2020.      Recent Visits Date Type Provider Dept  01/04/22 Office Visit Edward Jolly, MD [facility]  Showing recent visits within past 90 days and meeting all other requirements Today's Visits Date Type Provider Dept  03/01/22 Office Visit Edward Jolly, MD [facility]  Showing today's visits and meeting all other requirements Future Appointments No visits were found meeting these conditions. Showing future appointments within next 90 days and meeting all other requirements  I discussed the assessment and treatment plan with the patient. The patient was provided an opportunity to ask questions and all were answered. The patient agreed with the plan and demonstrated an understanding of the instructions.  Patient advised to call back or seek an in-person evaluation if the symptoms or condition worsens.  Duration of encounter: 30 minutes.  Note by: Edward Jolly, MD Date: 03/01/2022; Time: 10:11 AM

## 2022-03-01 NOTE — Progress Notes (Signed)
Safety precautions to be maintained throughout the outpatient stay will include: orient to surroundings, keep bed in low position, maintain call bell within reach at all times, provide assistance with transfer out of bed and ambulation.  Nursing Pain Medication Assessment:  Safety precautions to be maintained throughout the outpatient stay will include: orient to surroundings, keep bed in low position, maintain call bell within reach at all times, provide assistance with transfer out of bed and ambulation.  Medication Inspection Compliance: Pill count conducted under aseptic conditions, in front of the patient. Neither the pills nor the bottle was removed from the patient's sight at any time. Once count was completed pills were immediately returned to the patient in their original bottle.  Medication: Oxycodone/APAP Pill/Patch Count:  24 of 90 pills remain Pill/Patch Appearance: Markings consistent with prescribed medication Bottle Appearance: Standard pharmacy container. Clearly labeled. Filled Date: 21 / 20 / 2023 Last Medication intake:  Today

## 2022-03-27 ENCOUNTER — Ambulatory Visit (INDEPENDENT_AMBULATORY_CARE_PROVIDER_SITE_OTHER): Payer: Medicare Other | Admitting: Podiatry

## 2022-03-27 ENCOUNTER — Ambulatory Visit (INDEPENDENT_AMBULATORY_CARE_PROVIDER_SITE_OTHER): Payer: Medicare Other

## 2022-03-27 ENCOUNTER — Encounter: Payer: Self-pay | Admitting: Podiatry

## 2022-03-27 VITALS — BP 133/87 | HR 84

## 2022-03-27 DIAGNOSIS — M7671 Peroneal tendinitis, right leg: Secondary | ICD-10-CM

## 2022-03-27 DIAGNOSIS — M722 Plantar fascial fibromatosis: Secondary | ICD-10-CM | POA: Diagnosis not present

## 2022-03-27 DIAGNOSIS — M779 Enthesopathy, unspecified: Secondary | ICD-10-CM

## 2022-03-27 MED ORDER — METHYLPREDNISOLONE 4 MG PO TBPK: ORAL_TABLET | ORAL | 0 refills | Status: DC

## 2022-03-27 MED ORDER — MELOXICAM 15 MG PO TABS: 15.0000 mg | ORAL_TABLET | Freq: Every day | ORAL | 1 refills | Status: DC

## 2022-03-27 MED ORDER — BETAMETHASONE SOD PHOS & ACET 6 (3-3) MG/ML IJ SUSP
3.0000 mg | Freq: Once | INTRAMUSCULAR | Status: AC
  Administered 2022-03-27: 3 mg via INTRA_ARTICULAR

## 2022-03-27 NOTE — Progress Notes (Signed)
Chief Complaint  Patient presents with   Plantar Fasciitis    "I have heel pain on my left foot."   Foot Pain    "I have pain on the side of my right foot." N - pain side of foot L - lateral rt D - about 3 mos. O - gradually worse C - sharp pain, tender, achy A - contact, hit it against something,  T - none    Subjective: 67 y.o. male presenting today for recurrence of plantar fasciitis to the left foot.  Patient states that over the past few months he is developed a recurrence of his heel pain.  Requesting an injection today.  Patient states that at the last visit he had significant relief for about 5-6 months.  Patient also states that he has developed some pain to the lateral aspect of the right foot over the past 3-4 months.  Denies a history of injury.  Gradual onset.  However over the past week he has not had any pain or tenderness to this area.  He would like to have it evaluated   Past Medical History:  Diagnosis Date   Arthritis    Hepatitis    c 2000   Hypertension    Subarachnoid hemorrhage ([place]) 2017   Wears hearing aid in both ears    Past Surgical History:  Procedure Laterality Date   BRAIN SURGERY     [facility] COILING 2017   [place]   double hernia repair   JOINT REPLACEMENT Bilateral    TKR  L-2012, R-2015   SHOULDER ARTHROSCOPY WITH ROTATOR CUFF REPAIR AND OPEN BICEPS TENODESIS Left 09/08/2018   Procedure: LEFT SHOULDER ARTHROSCOPY,SUBSACAPULARIS REPAIR, SUBACROMIAL DECOMP, DISTAL CLAVICLE EXCISION,BICEP TENODESIS, MINI OPEN REGENTEN PATCH APPLICATION;  Surgeon: Leim Fabry, MD;  Location: [facility] ORS;  Service: Orthopedics;  Laterality: Left;   Allergies  Allergen Reactions   Sulfa Antibiotics Hives   Objective: Physical Exam General: The patient is alert and oriented x3 in no acute distress.  Dermatology: Skin is warm, dry and supple bilateral lower extremities. Negative for open lesions or macerations bilateral.   Vascular: Dorsalis  Pedis and Posterior Tibial pulses palpable bilateral.  Capillary fill time is immediate to all digits.  Neurological: Grossly intact via light touch  Musculoskeletal: Tenderness to palpation to the plantar aspect of the left heel along the plantar fascia. All other joints range of motion within normal limits bilateral. Strength 5/5 in all groups bilateral.   Radiographic exam RT foot: Normal osseous mineralization.  Degenerative changes noted with varus particular spurring throughout the foot.  No acute fractures identified  Assessment: 1. Plantar fasciitis left foot; recurrent 2.  Insertional peroneal tendinitis; improved  Plan of Care:  1. Patient evaluated. Xrays reviewed.  Patient states that over the past week he has not really had any pain or symptoms to the lateral aspect of the right foot.  We will simply observe for now 2. Injection of 0.5cc Celestone soluspan injected into the left plantar fascia.  3. Rx for Medrol Dose Pak placed 4. Rx for Meloxicam ordered for patient. 5.  Continue plan of show brace daily 6.  Advised against barefoot.  Recommend good supportive shoes and sneakers 7.  Patient is currently on pain management and taking prescribed hydrocodone.   8.  Return to clinic as needed.  Last visit 09/12/2021 patient states that he had several months of relief   Edrick Kins, DPM [facility]  Dr.  Edrick Kins, DPM    [address], [place] [address]                Office [phone]  Fax [phone]

## 2022-04-09 ENCOUNTER — Other Ambulatory Visit: Payer: Self-pay | Admitting: Student in an Organized Health Care Education/Training Program

## 2022-04-09 DIAGNOSIS — G894 Chronic pain syndrome: Secondary | ICD-10-CM

## 2022-04-09 DIAGNOSIS — Z79891 Long term (current) use of opiate analgesic: Secondary | ICD-10-CM

## 2022-04-09 DIAGNOSIS — M47816 Spondylosis without myelopathy or radiculopathy, lumbar region: Secondary | ICD-10-CM

## 2022-04-09 DIAGNOSIS — M5136 Other intervertebral disc degeneration, lumbar region: Secondary | ICD-10-CM

## 2022-04-09 DIAGNOSIS — G8929 Other chronic pain: Secondary | ICD-10-CM

## 2022-05-01 ENCOUNTER — Other Ambulatory Visit
Admission: RE | Admit: 2022-05-01 | Discharge: 2022-05-01 | Disposition: A | Discharge status: Home / Self Care | Payer: Medicare Other | Source: Ambulatory Visit | Attending: Physician Assistant | Admitting: Physician Assistant

## 2022-05-01 DIAGNOSIS — R000 Tachycardia, unspecified: Secondary | ICD-10-CM | POA: Diagnosis present

## 2022-05-01 DIAGNOSIS — R0789 Other chest pain: Secondary | ICD-10-CM | POA: Diagnosis present

## 2022-05-01 LAB — TROPONIN I (HIGH SENSITIVITY): Troponin I (High Sensitivity): 10 ng/L (ref <18)

## 2022-05-31 ENCOUNTER — Encounter: Payer: Medicare Other | Admitting: Student in an Organized Health Care Education/Training Program

## 2022-06-05 ENCOUNTER — Ambulatory Visit
Payer: Medicare Other | Attending: Student in an Organized Health Care Education/Training Program | Admitting: Student in an Organized Health Care Education/Training Program

## 2022-06-05 ENCOUNTER — Encounter: Payer: Self-pay | Admitting: Student in an Organized Health Care Education/Training Program

## 2022-06-05 VITALS — BP 147/94 | HR 71 | Temp 97.6°F | Ht 72.0 in | Wt 208.0 lb

## 2022-06-05 DIAGNOSIS — M47816 Spondylosis without myelopathy or radiculopathy, lumbar region: Secondary | ICD-10-CM | POA: Diagnosis present

## 2022-06-05 DIAGNOSIS — Z79891 Long term (current) use of opiate analgesic: Secondary | ICD-10-CM

## 2022-06-05 DIAGNOSIS — G894 Chronic pain syndrome: Secondary | ICD-10-CM | POA: Diagnosis not present

## 2022-06-05 DIAGNOSIS — M5136 Other intervertebral disc degeneration, lumbar region: Secondary | ICD-10-CM

## 2022-06-05 DIAGNOSIS — G8929 Other chronic pain: Secondary | ICD-10-CM | POA: Diagnosis present

## 2022-06-05 DIAGNOSIS — M533 Sacrococcygeal disorders, not elsewhere classified: Secondary | ICD-10-CM

## 2022-06-05 MED ORDER — BACLOFEN 10 MG PO TABS: 10.0000 mg | ORAL_TABLET | Freq: Every evening | ORAL | 0 refills | Status: DC | PRN

## 2022-06-05 MED ORDER — OXYCODONE-ACETAMINOPHEN 5-325 MG PO TABS: 1.0000 | ORAL_TABLET | Freq: Three times a day (TID) | ORAL | 0 refills | Status: AC | PRN

## 2022-06-05 MED ORDER — CYCLOBENZAPRINE HCL 10 MG PO TABS: 10.0000 mg | ORAL_TABLET | Freq: Every day | ORAL | 1 refills | Status: DC

## 2022-06-05 MED ORDER — OXYCODONE-ACETAMINOPHEN 5-325 MG PO TABS: 1.0000 | ORAL_TABLET | Freq: Three times a day (TID) | ORAL | 0 refills | Status: DC | PRN

## 2022-06-05 NOTE — Progress Notes (Signed)
PROVIDER NOTE: Information contained herein reflects review and annotations entered in association with encounter. Interpretation of such information and data should be left to medically-trained personnel. Information provided to patient can be located elsewhere in the medical record under "Patient Instructions". Document created using STT-dictation technology, any transcriptional errors that may result from process are unintentional.    Patient: Charles Mooney  Service Category: E/M  Provider: Edward Jolly, MD  DOB: 1955-07-12  DOS: 06/05/2022  Specialty: Interventional Pain Management  MRN: [id number]  Setting: Ambulatory outpatient  PCP: Enid Baas, MD  Type: Established Patient    Referring Provider: Enid Baas, MD  Location: Office  Delivery: Face-to-face     HPI  [name], a 67 y.o. year old male, is here today because of his Chronic pain syndrome [G89.4]. [name]'s primary complain today is Back Pain (Lower left back)  Last encounter: My last encounter with him was on 03/01/22  Pertinent problems: [name] has Chronic bilateral low back pain with bilateral sciatica; Acute bursitis of right shoulder; Nonallopathic lesion of sacral region; Lumbar facet arthropathy; Lumbar degenerative disc disease; Localized osteoarthritis of shoulder regions, bilateral; Encounter for long-term opiate analgesic use; Chronic SI joint pain; and Chronic pain syndrome on their pertinent problem list. Pain Assessment: Severity of Chronic pain is reported as a 7 /10. Location: Back Left, Lower/denies. Onset: More than a month ago. Quality: Constant, Aching, Dull, Sharp. Timing: Constant. Modifying factor(s): TENS, ice. Vitals:  height is 6' (1.829 m) and weight is 208 lb (94.3 kg). His temporal temperature is 97.6 F (36.4 C). His blood pressure is 147/94 (abnormal) and his pulse is 71. His oxygen saturation is 99%.   Reason for encounter: medication management.    No change in  medical history since last visit.  Patient's pain is at baseline.  Patient continues multimodal pain regimen as prescribed.  States that it provides pain relief and improvement in functional status. Discussed dose reduction of Percocet to 5 mg TID prn  Pharmacotherapy Assessment  Analgesic:  Percocet 5 mg TID prn   Monitoring: [place] PMP: PDMP reviewed during this encounter.       Pharmacotherapy: No side-effects or adverse reactions reported. Compliance: No problems identified. Effectiveness: Clinically acceptable.  UDS:  Summary  Date Value Ref Range Status  06/06/2021 Note  Final    Comment:    ==================================================================== [employer] Select 13 (MW) ==================================================================== Test                             Result       Flag       Units  Drug Present and Declared for Prescription Verification   Hydrocodone                    2798         EXPECTED   ng/mg creat   Hydromorphone                  385          EXPECTED   ng/mg creat   Dihydrocodeine                 339          EXPECTED   ng/mg creat   Norhydrocodone                 2378         EXPECTED  ng/mg creat    Sources of hydrocodone include scheduled prescription medications.    Hydromorphone, dihydrocodeine and norhydrocodone are expected    metabolites of hydrocodone. Hydromorphone and dihydrocodeine are    also available as scheduled prescription medications.  ==================================================================== Test                      Result    Flag   Units      Ref Range   Creatinine              59               mg/dL      >=16 ==================================================================== Declared Medications:  The flagging and interpretation on this report are based on the  following declared medications.  Unexpected results may arise from  inaccuracies in the declared medications.   **Note: The testing scope of  this panel includes these medications:   Hydrocodone (Norco)   **Note: The testing scope of this panel does not include the  following reported medications:   Acetaminophen (Norco)  Amlodipine (Norvasc)  Cannabidiol  Clindamycin (Cleocin)  Fexofenadine (Allegra)  Fluticasone (Flonase)  Tizanidine (Zanaflex)  Triamcinolone (Kenalog) ==================================================================== For clinical consultation, please call [phone]. ====================================================================       ROS  Constitutional: Denies any fever or chills Gastrointestinal: No reported hemesis, hematochezia, vomiting, or acute GI distress Musculoskeletal:  LBP, left > right Neurological: No reported episodes of acute onset apraxia, aphasia, dysarthria, agnosia, amnesia, paralysis, loss of coordination, or loss of consciousness  Medication Review  NON FORMULARY, amLODipine, baclofen, citalopram, cyclobenzaprine, meloxicam, oxyCODONE-acetaminophen, and traZODone  History Review  Allergy: [name] is allergic to sulfa antibiotics. Drug: [name]  reports that he does not currently use drugs. Alcohol:  reports current alcohol use of about 18.0 standard drinks of alcohol per week. Tobacco:  reports that he has been smoking cigarettes. He has been smoking an average of .22 packs per day. He has never used smokeless tobacco. Social: [name]  reports that he has been smoking cigarettes. He has been smoking an average of .22 packs per day. He has never used smokeless tobacco. He reports current alcohol use of about 18.0 standard drinks of alcohol per week. He reports that he does not currently use drugs. Medical:  has a past medical history of Arthritis, Hepatitis, Hypertension, Subarachnoid hemorrhage (2017), and Wears hearing aid in both ears. Surgical: [name]  has a past surgical history that includes Brain surgery; Joint replacement (Bilateral);  Shoulder arthroscopy with rotator cuff repair and open biceps tenodesis (Left, 09/08/2018); and Hernia repair (1963). Family: family history is not on file.  Laboratory Chemistry Profile   Renal Lab Results  Component Value Date   BUN 18 09/04/2018   CREATININE 0.81 09/04/2018   GFRAA >60 09/04/2018   GFRNONAA >60 09/04/2018     Hepatic Lab Results  Component Value Date   AST 18 09/04/2018   ALT 20 09/04/2018   ALBUMIN 4.4 09/04/2018   ALKPHOS 39 09/04/2018     Electrolytes Lab Results  Component Value Date   NA 138 09/04/2018   K 3.9 09/04/2018   CL 105 09/04/2018   CALCIUM 9.2 09/04/2018     Bone No results found for: "VD25OH", "VD125OH2TOT", "WJ1914NW2", "NF6213YQ6", "25OHVITD1", "25OHVITD2", "25OHVITD3", "TESTOFREE", "TESTOSTERONE"   Inflammation (CRP: Acute Phase) (ESR: Chronic Phase) No results found for: "CRP", "ESRSEDRATE", "LATICACIDVEN"     Note: Above Lab results reviewed.  Recent Imaging Review  DG  Foot Complete Right Please see detailed radiograph report in office note.  Note: Reviewed        CLINICAL DATA:  Right shoulder pain and limited range of motion for 6 months. No known injury.   EXAM: MRI OF THE RIGHT SHOULDER WITHOUT CONTRAST   TECHNIQUE: Multiplanar, multisequence MR imaging of the shoulder was performed. No intravenous contrast was administered.   COMPARISON:  None.   FINDINGS: Rotator cuff: There is extensive heterogeneous increased T2 signal and thickening of the rotator cuff tendons consistent with severe tendinopathy. Tendinopathy is worst in supraspinatus where virtually no normal-appearing tendon is seen.   Muscles: Intermediate intensity edema is seen in the supraspinatus muscle belly and there is very mild atrophy of the supraspinatus.   Biceps long head: There is severe tendinopathy of both the intra and extra-articular segments, worse in the intra-articular segment.   Acromioclavicular Joint: Moderately severe  degenerative change is present. Type 1 acromion. There is subacromial spurring. A large volume of fluid is seen in the subacromial/subdeltoid bursa   Glenohumeral Joint: Mild degenerative change is seen. Prominent fluid is noted in the subscapularis recess.   Labrum: The superior labrum is degenerated but no tear is identified.   Bones:  No fracture or focal lesion.   Other: None.   IMPRESSION: Severe rotator cuff tendinopathy is worst in the supraspinatus where virtually no normal-appearing tendon is identified but no focal tear is seen. There is mild atrophy of the supraspinatus muscle belly.   Tendinopathy of the intra and extra-articular long head of biceps is severe in the intra-articular segment.   Moderately severe acromioclavicular osteoarthritis. Subacromial spurring is noted.   Large volume of subacromial/subdeltoid fluid consistent with bursitis.     Electronically Signed   By: Drusilla Kanner M.D.   On: 11/21/2017 09:08    Physical Exam  General appearance: Well nourished, well developed, and well hydrated. In no apparent acute distress Mental status: Alert, oriented x 3 (person, place, & time)       Respiratory: No evidence of acute respiratory distress Eyes: PERLA Vitals: BP (!) 147/94 (BP Location: Right Arm, Patient Position: Sitting, Cuff Size: Normal)   Pulse 71   Temp 97.6 F (36.4 C) (Temporal)   Ht 6' (1.829 m)   Wt 208 lb (94.3 kg)   SpO2 99%   BMI 28.21 kg/m  BMI: Estimated body mass index is 28.21 kg/m as calculated from the following:   Height as of this encounter: 6' (1.829 m).   Weight as of this encounter: 208 lb (94.3 kg). Ideal: Ideal body weight: 77.6 kg (171 lb 1.2 oz) Adjusted ideal body weight: 84.3 kg (185 lb 13.5 oz)  Lumbar Spine Area Exam  Skin & Axial Inspection: No masses, redness, or swelling Alignment: Symmetrical Functional ROM: Unrestricted ROM       Stability: No instability detected Muscle Tone/Strength:  Functionally intact. No obvious neuro-muscular anomalies detected. Sensory (Neurological): Musculoskeletal pain pattern pain with facet loading, Left > right   5 out of 5 strength bilateral lower extremity: Plantar flexion, dorsiflexion, knee flexion, knee extension.    Assessment   Diagnosis  1. Chronic pain syndrome   2. Chronic SI joint pain   3. Lumbar facet arthropathy   4. Encounter for long-term opiate analgesic use   5. Lumbar degenerative disc disease       Plan of Care   [name] has a current medication list which includes the following long-term medication(s): amlodipine, citalopram, and trazodone.  Pharmacotherapy (Medications Ordered): Meds ordered this encounter  Medications   oxyCODONE-acetaminophen (PERCOCET) 5-325 MG tablet    Sig: Take 1 tablet by mouth every 8 (eight) hours as needed for severe pain. Must last 30 days.    Dispense:  90 tablet    Refill:  0    Chronic Pain: STOP Act (Not applicable) Fill 1 day early if closed on refill date. Avoid benzodiazepines within 8 hours of opioids   oxyCODONE-acetaminophen (PERCOCET) 5-325 MG tablet    Sig: Take 1 tablet by mouth every 8 (eight) hours as needed for severe pain. Must last 30 days.    Dispense:  90 tablet    Refill:  0    Chronic Pain: STOP Act (Not applicable) Fill 1 day early if closed on refill date. Avoid benzodiazepines within 8 hours of opioids   oxyCODONE-acetaminophen (PERCOCET) 5-325 MG tablet    Sig: Take 1 tablet by mouth every 8 (eight) hours as needed for severe pain. Must last 30 days.    Dispense:  90 tablet    Refill:  0    Chronic Pain: STOP Act (Not applicable) Fill 1 day early if closed on refill date. Avoid benzodiazepines within 8 hours of opioids   cyclobenzaprine (FLEXERIL) 10 MG tablet    Sig: Take 1 tablet (10 mg total) by mouth at bedtime.    Dispense:  90 tablet    Refill:  1    Do not place this medication, or any other prescription from our practice, on  "Automatic Refill". Patient may have prescription filled one day early if pharmacy is closed on scheduled refill date.   baclofen (LIORESAL) 10 MG tablet    Sig: Take 1 tablet (10 mg total) by mouth at bedtime as needed for muscle spasms. Do not take same day as flexeril    Dispense:  90 tablet    Refill:  0   Not to take Baclofen with Flexeril or on same day as Flexeril  Orders Placed This Encounter  Procedures   [employer] Select 13 (MW), Urine    Volume: 30 ml(s). Minimum 3 ml of urine is needed. Document temperature of fresh sample. Indications: Long term (current) use of opiate analgesic [phone])    Order Specific Question:   Release to patient    Answer:   Immediate    Follow-up plan:   Return in about 3 months (around 09/06/2022) for Medication Management, in person.     Status post bilateral L3, L4, L5, S1 facet  medial branch nerve blocks #1 on 06/01/2019 50% pain relief for 3 days, consider repeating in future or consider sprint peripheral nerve stimulation of medial branch at L4 , s/p right glenohumeral joint injection 09/14/2020.      Recent Visits No visits were found meeting these conditions. Showing recent visits within past 90 days and meeting all other requirements Today's Visits Date Type Provider Dept  06/05/22 Office Visit Edward Jolly, MD [facility]  Showing today's visits and meeting all other requirements Future Appointments No visits were found meeting these conditions. Showing future appointments within next 90 days and meeting all other requirements  I discussed the assessment and treatment plan with the patient. The patient was provided an opportunity to ask questions and all were answered. The patient agreed with the plan and demonstrated an understanding of the instructions.  Patient advised to call back or seek an in-person evaluation if the symptoms or condition worsens.  Duration of encounter: 30 minutes.  Note by:  Edward Jolly, MD Date:  06/05/2022; Time: 11:19 AM

## 2022-06-05 NOTE — Progress Notes (Signed)
Nursing Pain Medication Assessment:  Safety precautions to be maintained throughout the outpatient stay will include: orient to surroundings, keep bed in low position, maintain call bell within reach at all times, provide assistance with transfer out of bed and ambulation.  Medication Inspection Compliance: Pill count conducted under aseptic conditions, in front of the patient. Neither the pills nor the bottle was removed from the patient's sight at any time. Once count was completed pills were immediately returned to the patient in their original bottle.  Medication: Oxycodone/APAP Pill/Patch Count:  14 of 90 pills remain Pill/Patch Appearance: Markings consistent with prescribed medication Bottle Appearance: Standard pharmacy container. Clearly labeled. Filled Date: 03 / 19 / 2024 Last Medication intake:  TodaySafety precautions to be maintained throughout the outpatient stay will include: orient to surroundings, keep bed in low position, maintain call bell within reach at all times, provide assistance with transfer out of bed and ambulation.

## 2022-06-11 LAB — TOXASSURE SELECT 13 (MW), URINE

## 2022-07-06 ENCOUNTER — Other Ambulatory Visit: Payer: Self-pay | Admitting: Student in an Organized Health Care Education/Training Program

## 2022-08-26 ENCOUNTER — Other Ambulatory Visit: Payer: Self-pay

## 2022-08-26 ENCOUNTER — Emergency Department: Payer: Medicare Other

## 2022-08-26 ENCOUNTER — Emergency Department
Admission: EM | Admit: 2022-08-26 | Discharge: 2022-08-26 | Disposition: A | Discharge status: Home / Self Care | Payer: Medicare Other | Attending: Emergency Medicine | Admitting: Emergency Medicine

## 2022-08-26 DIAGNOSIS — S2232XA Fracture of one rib, left side, initial encounter for closed fracture: Secondary | ICD-10-CM | POA: Diagnosis not present

## 2022-08-26 DIAGNOSIS — R0789 Other chest pain: Secondary | ICD-10-CM | POA: Diagnosis present

## 2022-08-26 DIAGNOSIS — S270XXA Traumatic pneumothorax, initial encounter: Secondary | ICD-10-CM | POA: Diagnosis not present

## 2022-08-26 DIAGNOSIS — W06XXXA Fall from bed, initial encounter: Secondary | ICD-10-CM | POA: Diagnosis not present

## 2022-08-26 MED ORDER — OXYCODONE-ACETAMINOPHEN 5-325 MG PO TABS
1.0000 | ORAL_TABLET | Freq: Once | ORAL | Status: AC
  Administered 2022-08-26: 1 via ORAL
  Filled 2022-08-26: qty 1

## 2022-08-26 NOTE — ED Notes (Signed)
See triage note  Presents s/p fall  States he rolled and hit a concrete floor   Having pain to left side of chest   Felt a pop after stretching

## 2022-08-26 NOTE — ED Notes (Signed)
Dr Derrill Kay in with pt  Placed n O2    Resting at present

## 2022-08-26 NOTE — ED Triage Notes (Signed)
Pt to ED for fall from bed to concrete floor on Thursday night. Was asleep dreaming that he was rolling under a car and somehow "catapulted" self out of bed during the dream. Pain got worse yesterday to L rib area. Worse with deep breath. Wants to make sure no rib fx. States heard a "pop" yesterday when he was stretching.  Respirations unlabored, skin dry. Quit smoking 2 weeks ago.

## 2022-08-26 NOTE — ED Notes (Signed)
Pt verbalizes understanding of discharge instructions. Opportunity for questioning and answers were provided. Pt discharged from ED to home with wife.    

## 2022-08-26 NOTE — ED Provider Notes (Signed)
[facility] Provider Note    Event Date/Time   First MD Initiated Contact with Patient 08/26/22 1153     (approximate)   History   Fall   HPI  Charles Mooney is a 68 y.o. male who presents to the emergency department today because of concerns for left chest pain after a fall.  The patient fell 3 nights ago.  He says that he had a very vivid dream and rolled out of bed onto his concrete floor with his arm under his left chest wall.  He did have some pain at that time.  However yesterday he was doing more activity when he felt it increased and changes of the pain as well as a pop.  The pain is worse with deep breaths although the patient denies shortness of breath.  No significant cough.     Physical Exam   Triage Vital Signs: ED Triage Vitals  Enc Vitals Group     BP 08/26/22 0925 (!) 143/87     Pulse Rate 08/26/22 0925 72     Resp 08/26/22 0925 16     Temp 08/26/22 0925 97.6 F (36.4 C)     Temp Source 08/26/22 0925 Oral     SpO2 08/26/22 0925 96 %     Weight 08/26/22 0922 214 lb (97.1 kg)     Height 08/26/22 0922 6' (1.829 m)     Head Circumference --      Peak Flow --      Pain Score 08/26/22 0921 4     Pain Loc --      Pain Edu? --      Excl. in GC? --     Most recent vital signs: Vitals:   08/26/22 1100 08/26/22 1200  BP: 136/86 (!) 141/83  Pulse: 71 63  Resp: 14 15  Temp:    SpO2: 97% 100%   General: Awake, alert, oriented. CV:  Good peripheral perfusion. Regular rate and rhythm. Resp:  Normal effort. Lungs clear. Abd:  No distention.     ED Results / Procedures / Treatments   Labs (all labs ordered are listed, but only abnormal results are displayed) Labs Reviewed - No data to display   EKG  None   RADIOLOGY I independently interpreted and visualized the CXR. My interpretation: small left sided pneumothorax. Radiology interpretation:  IMPRESSION:  1. Small left pneumothorax. Nondisplaced fracture of the   anterolateral left fifth rib. No other acute abnormality.  2. Mild left lung base atelectasis.   PROCEDURES:  Critical Care performed: No   MEDICATIONS ORDERED IN ED: Medications - No data to display   IMPRESSION / MDM / ASSESSMENT AND PLAN / ED COURSE  I reviewed the triage vital signs and the nursing notes.                              Differential diagnosis includes, but is not limited to, rib fracture, pneumothorax, contusion.  Patient's presentation is most consistent with acute presentation with potential threat to life or bodily function.  Patient presented to the emergency department today because of concerns for left chest pain.  X-ray is concerning for left rib fracture and small pneumothorax.  I did have a discussion with the patient.  Did offer admission for observation.  At this time do not think patient necessitates chest tube.  Also discussed possibility of repeating x-ray in 6 hours and if stable think  patient would be reasonable to be treated as an outpatient.  Did discuss risk if things were to get worse as an outpatient versus being admitted however patient stated that he would prefer the 6-hour x-ray and discharge if possible.   FINAL CLINICAL IMPRESSION(S) / ED DIAGNOSES   Final diagnoses:  Traumatic pneumothorax, initial encounter  Closed fracture of one rib of left side, initial encounter     Note:  This document was prepared using Dragon voice recognition software and may include unintentional dictation errors.    Phineas Semen, MD 08/26/22 [phone]

## 2022-08-26 NOTE — ED Provider Notes (Signed)
-----------------------------------------   4:16 PM on 08/26/2022 -----------------------------------------  I took over care of this patient from Dr. Derrill Kay.  Repeat x-ray after 6 hours shows no worsening.  O2 saturation remains 100% on room air.  The patient is stable for discharge home.  I have counseled him on the results of the workup.  I gave strict return precautions and he expresses understanding.   [name], MD 08/26/22 1616

## 2022-09-04 ENCOUNTER — Encounter: Payer: Self-pay | Admitting: Student in an Organized Health Care Education/Training Program

## 2022-09-04 ENCOUNTER — Ambulatory Visit
Payer: Medicare Other | Attending: Student in an Organized Health Care Education/Training Program | Admitting: Student in an Organized Health Care Education/Training Program

## 2022-09-04 VITALS — BP 140/96 | HR 86 | Temp 97.1°F | Resp 16 | Ht 72.0 in | Wt 210.0 lb

## 2022-09-04 DIAGNOSIS — S2232XD Fracture of one rib, left side, subsequent encounter for fracture with routine healing: Secondary | ICD-10-CM | POA: Diagnosis not present

## 2022-09-04 DIAGNOSIS — G894 Chronic pain syndrome: Secondary | ICD-10-CM | POA: Insufficient documentation

## 2022-09-04 DIAGNOSIS — G588 Other specified mononeuropathies: Secondary | ICD-10-CM | POA: Insufficient documentation

## 2022-09-04 DIAGNOSIS — Z79891 Long term (current) use of opiate analgesic: Secondary | ICD-10-CM | POA: Diagnosis not present

## 2022-09-04 IMAGING — MR MR SHOULDER*R* W/O CM
5 series · 40 of 40 positions shown · non-contrast
Comparison: None.

CLINICAL DATA: Right shoulder pain

EXAM:
MRI OF THE RIGHT SHOULDER WITHOUT CONTRAST
TECHNIQUE: Multiplanar, multisequence MR imaging of the shoulder was performed.
No intravenous contrast was administered.

[Series 4: T2 fat-sat · axial · 4.0mm · 0.59mm/px · z∈[-38,+76]mm · 8 of 27 slices shown (1 of 3)]
[im 1/27]
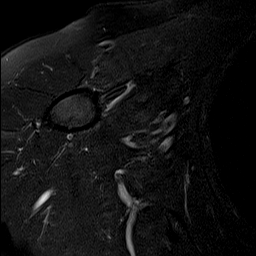
[im 4/27]
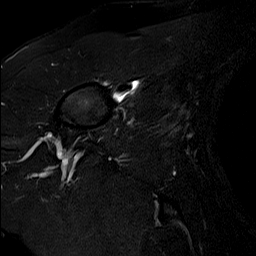
[im 8/27]
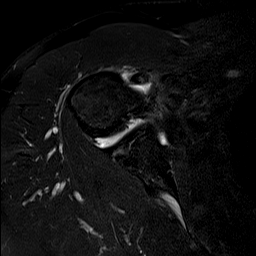
[im 12/27]
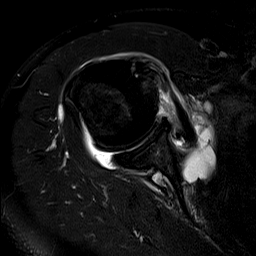
[im 15/27]
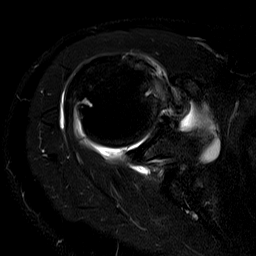
[im 19/27]
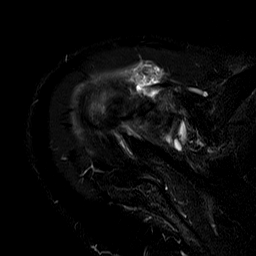
[im 23/27]
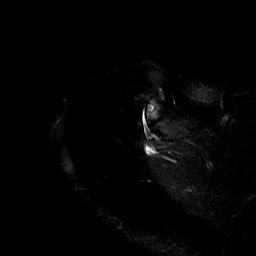
[im 27/27]
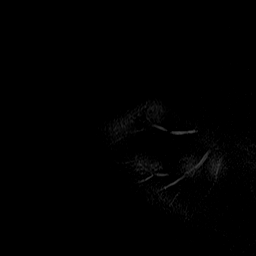

[Series 5: T2 fat-sat · oblique · 4.0mm · 0.59mm/px · 8 of 29 slices shown (2 of 3)]
[im 1/29]
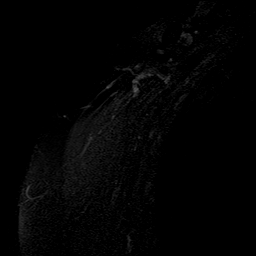
[im 5/29]
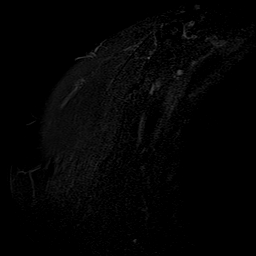
[im 9/29]
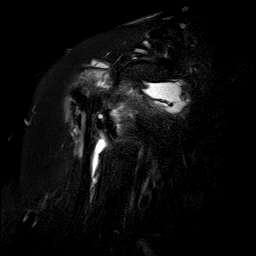
[im 13/29]
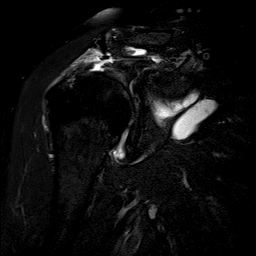
[im 17/29]
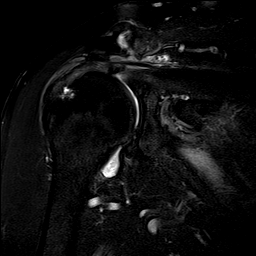
[im 21/29]
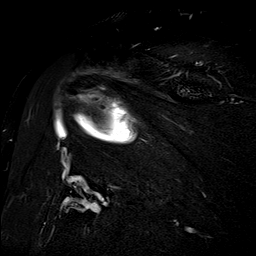
[im 25/29]
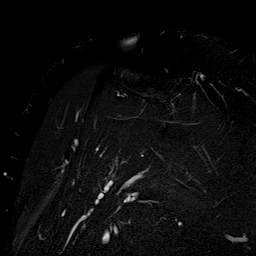
[im 29/29]
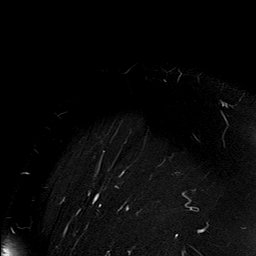

[Series 6: PD · oblique · 4.0mm · 0.59mm/px · 8 of 29 slices shown]
[im 1/29]
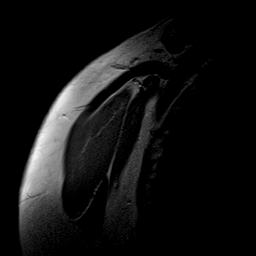
[im 5/29]
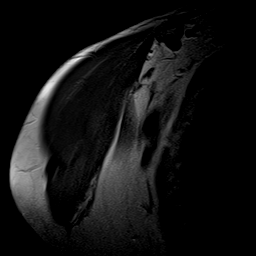
[im 9/29]
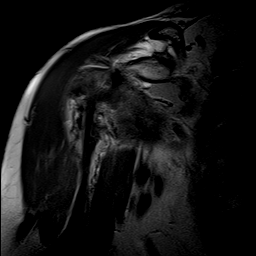
[im 13/29]
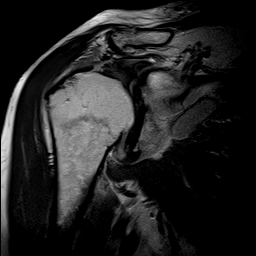
[im 17/29]
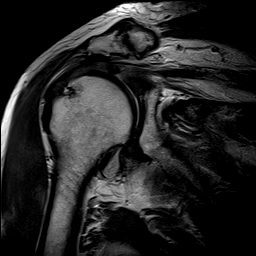
[im 21/29]
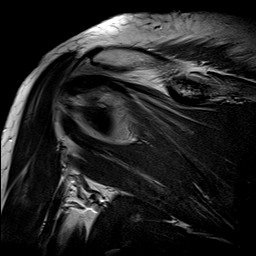
[im 25/29]
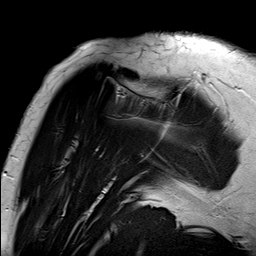
[im 29/29]
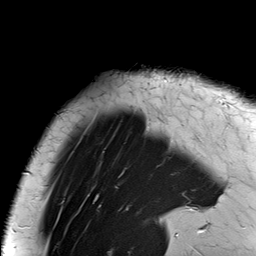

[Series 7: T1 · oblique · 4.0mm · 0.59mm/px · 8 of 29 slices shown]
[im 1/29]
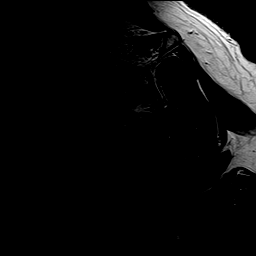
[im 5/29]
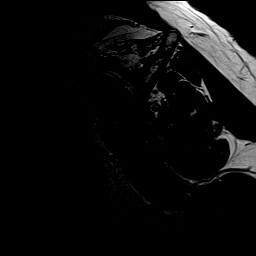
[im 9/29]
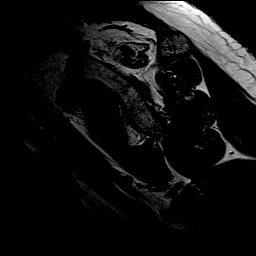
[im 13/29]
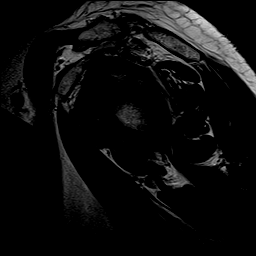
[im 17/29]
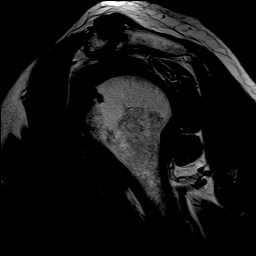
[im 21/29]
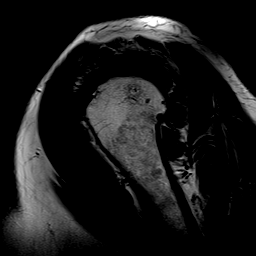
[im 25/29]
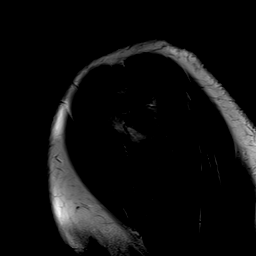
[im 29/29]
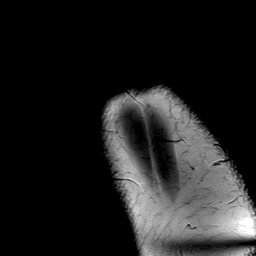

[Series 8: T2 fat-sat · oblique · 4.0mm · 0.59mm/px · 8 of 29 slices shown (3 of 3)]
[im 1/29]
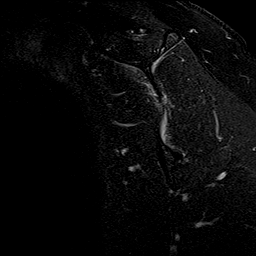
[im 5/29]
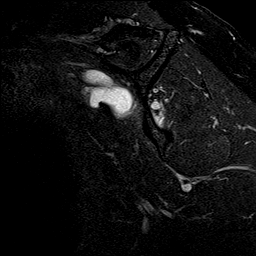
[im 9/29]
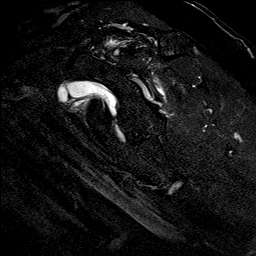
[im 13/29]
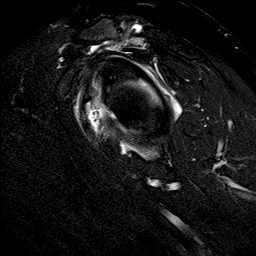
[im 17/29]
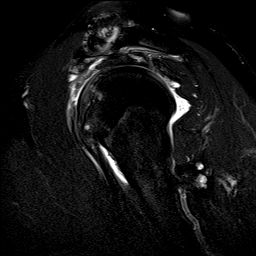
[im 21/29]
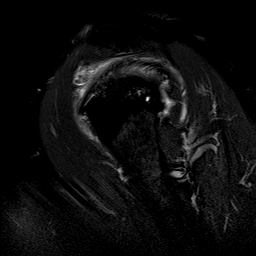
[im 25/29]
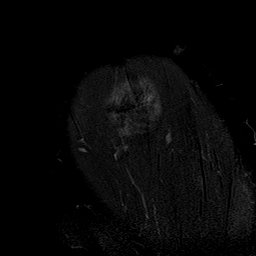
[im 29/29]
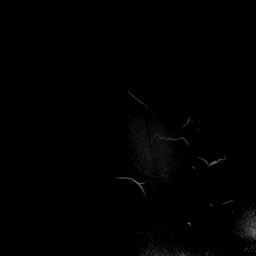

[40 of 40 positions shown; findings below may reference images not displayed]

FINDINGS: Rotator cuff: There is a full-thickness, essentially full width tear
of the supraspinatus tendon proximal to the footprint at the level
of the sub acromial space, with approximately 1.0 cm fluid gap.
There is severe tendinosis of the infraspinatus tendon, with
articular and bursal sided fraying. Teres minor is intact. Severe
distal subscapularis tendinosis with intermediate grade articular
sided tearing near the footprint and medialization of the long head
biceps tendon at the top of the bicipital groove.

Muscles: Grade 4 muscle atrophy of the supraspinatus. No other
significant muscle atrophy.

Biceps Long Head: Severe tendinosis of the intra-articular long head
biceps tendon, with likely partial tearing, and medialization at the
top of the bicipital groove.

Acromioclavicular Joint: Severe arthropathy of the acromioclavicular
joint. Significant amount of subacromial/subdeltoid bursal fluid
with synovitis, related to the cuff tear.

Glenohumeral Joint: Moderate size joint effusion with synovitis.
There is fluid collecting along the subscapularis recess and
subcoracoid bursa. Moderate chondrosis.

Labrum: Diffuse degenerative labral fraying with superior labral
tearing at and anterior to the biceps labral anchor.

Bones: No fracture or dislocation. No aggressive osseous lesion. No
additional findings.

Other: No fluid collection or hematoma.
IMPRESSION: Full-thickness, essentially full width tear of the supraspinatus
tendon proximal to the footprint at the level of the subacromial
space, with approximally 1.0 cm fluid gap. Grade 4 muscle atrophy of
the supraspinatus.

Severe tendinosis of the infraspinatus tendon with articular and
bursal sided fraying.

Severe distal subscapularis tendinosis with intermediate grade
articular sided tearing at the footprint and mild medialization of
the long head biceps tendon at the top of the bicipital groove.
Severe tendinosis of the intra-articular LHBT with likely partial
tearing.

Glenohumeral osteoarthritis with moderate chondrosis, diffuse
degenerative labral fraying, and degenerative superior labral
tearing at and anterior to the biceps labral anchor.

Moderate-sized joint effusion with synovitis and
subacromial-subdeltoid bursitis.

Severe AC joint arthropathy.

## 2022-09-04 MED ORDER — OXYCODONE-ACETAMINOPHEN 5-325 MG PO TABS
1.0000 | ORAL_TABLET | Freq: Three times a day (TID) | ORAL | 0 refills | Status: DC | PRN
Start: 2022-11-03 — End: 2022-11-27

## 2022-09-04 MED ORDER — OXYCODONE-ACETAMINOPHEN 5-325 MG PO TABS
1.0000 | ORAL_TABLET | Freq: Four times a day (QID) | ORAL | 0 refills | Status: AC | PRN
Start: 2022-09-04 — End: 2022-10-04

## 2022-09-04 MED ORDER — OXYCODONE-ACETAMINOPHEN 5-325 MG PO TABS
1.0000 | ORAL_TABLET | Freq: Three times a day (TID) | ORAL | 0 refills | Status: AC | PRN
Start: 2022-10-04 — End: 2022-11-03

## 2022-09-04 NOTE — Progress Notes (Signed)
Nursing Pain Medication Assessment:  Safety precautions to be maintained throughout the outpatient stay will include: orient to surroundings, keep bed in low position, maintain call bell within reach at all times, provide assistance with transfer out of bed and ambulation.  Medication Inspection Compliance: Pill count conducted under aseptic conditions, in front of the patient. Neither the pills nor the bottle was removed from the patient's sight at any time. Once count was completed pills were immediately returned to the patient in their original bottle.  Medication: Oxycodone/APAP Pill/Patch Count:  0 of 90 pills remain Pill/Patch Appearance: Markings consistent with prescribed medication Bottle Appearance: Standard pharmacy container. Clearly labeled. Filled Date: 06 / 18 / 2024 Last Medication intake:  Today

## 2022-09-04 NOTE — Progress Notes (Signed)
PROVIDER NOTE: Information contained herein reflects review and annotations entered in association with encounter. Interpretation of such information and data should be left to medically-trained personnel. Information provided to patient can be located elsewhere in the medical record under "Patient Instructions". Document created using STT-dictation technology, any transcriptional errors that may result from process are unintentional.    Patient: Charles Mooney  Service Category: E/M  Provider: Edward Jolly, MD  DOB: [birth year]  DOS: 09/04/2022  Specialty: Interventional Pain Management  MRN: [id number]  Setting: Ambulatory outpatient  PCP: Charles Baas, MD  Type: Established Patient    Referring Provider: Enid Baas, MD  Location: Office  Delivery: Face-to-face     HPI  [name], a 67 y.o. 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 [name]'s primary complain today is Back Pain (Lumbar left is worse ), Leg Pain (Bilateral worse on the left ), Shoulder Pain (Bilateral, s/p surgery getting better ), and Rib Injury (5th intercostal fx from a fall )  Last encounter: My last encounter with him was on 06/05/2022  Pertinent problems: [name] has Chronic bilateral low back pain with bilateral sciatica; Acute bursitis of right shoulder; Nonallopathic lesion of sacral region; Lumbar facet arthropathy; Lumbar degenerative disc disease; Localized osteoarthritis of shoulder regions, bilateral; Encounter for long-term opiate analgesic use; Chronic SI joint pain; and Chronic pain syndrome on their pertinent problem list. Pain Assessment: Severity of Chronic pain is reported as a 7 /10. Location: Back (see visit infor for additional pain sites) Lower, Left, Right/back pain into both legs, left is worse. Onset: More than a month ago. Quality: Discomfort, Dull, Constant, Aching, Sharp, Numbness, Tingling. Timing: Constant. Modifying factor(s): stretching and  exercise.  has had to let up since the intercostal fx.. Vitals:  height is 6' (1.829 m) and weight is 210 lb (95.3 kg). His temporal temperature is 97.1 F (36.2 C) (abnormal). His blood pressure is 140/96 (abnormal) and his pulse is 86. His respiration is 16 and oxygen saturation is 97%.   Reason for encounter: medication management.    Charles Mooney presents today for medication management as well as increased left sided rib pain secondary to a rib fracture that he sustained following off of his bed.  He did present to the emergency department and was told that he had a pneumothorax however this was managed conservatively without a chest tube.  He does have pain with inspiration.  He states that he has had rib fractures in the past but this was taking somewhat longer to heal.  He is out of his medications 2 days early given acute on chronic pain related to his intercostal neuralgia.  We discussed left intercostal nerve block and a temporary increase in his analgesics to help optimize his pain relief until his heals.  Otherwise no change in his medical history.  Pharmacotherapy Assessment  Analgesic:  Percocet 5 mg TID prn   Monitoring: [place] PMP: PDMP reviewed during this encounter.       Pharmacotherapy: No side-effects or adverse reactions reported. Compliance: No problems identified. Effectiveness: Clinically acceptable.  UDS:  Summary  Date Value Ref Range Status  06/05/2022 Note  Final    Comment:    ==================================================================== [employer] [facility] (MW) ==================================================================== Test                             Result       Flag  Units  Drug Present and Declared for Prescription Verification   Oxycodone                      717          EXPECTED   ng/mg creat   Oxymorphone                    700          EXPECTED   ng/mg creat   Noroxycodone                   2135         EXPECTED   ng/mg creat    Noroxymorphone                 348          EXPECTED   ng/mg creat    Sources of oxycodone are scheduled prescription medications.    Oxymorphone, noroxycodone, and noroxymorphone are expected    metabolites of oxycodone. Oxymorphone is also available as a    scheduled prescription medication.  ==================================================================== Test                      Result    Flag   Units      Ref Range   Creatinine              23               mg/dL      >=91 ==================================================================== Declared Medications:  The flagging and interpretation on this report are based on the  following declared medications.  Unexpected results may arise from  inaccuracies in the declared medications.   **Note: The testing scope of this panel includes these medications:   Oxycodone (Percocet)   **Note: The testing scope of this panel does not include the  following reported medications:   Acetaminophen (Percocet)  Amlodipine (Norvasc)  Baclofen (Lioresal)  Cannabidiol  Citalopram (Celexa)  Cyclobenzaprine (Flexeril)  Meloxicam (Mobic)  Trazodone (Desyrel) ==================================================================== For clinical consultation, please call [phone]. ====================================================================       ROS  Constitutional: Denies any fever or chills Gastrointestinal: No reported hemesis, hematochezia, vomiting, or acute GI distress Musculoskeletal:  Left chest wall, rib pain Neurological: No reported episodes of acute onset apraxia, aphasia, dysarthria, agnosia, amnesia, paralysis, loss of coordination, or loss of consciousness  Medication Review  NON FORMULARY, amLODipine, baclofen, citalopram, cyclobenzaprine, meloxicam, oxyCODONE-acetaminophen, and traZODone  History Review  Allergy: [name] is allergic to sulfa antibiotics. Drug: [name]  reports that he does not  currently use drugs. Alcohol:  reports current alcohol use of about 18.0 standard drinks of alcohol per week. Tobacco:  reports that he quit smoking about 3 weeks ago. His smoking use included cigarettes. He has never used smokeless tobacco. Social: Mr. Mathieu  reports that he quit smoking about 3 weeks ago. His smoking use included cigarettes. He has never used smokeless tobacco. He reports current alcohol use of about 18.0 standard drinks of alcohol per week. He reports that he does not currently use drugs. Medical:  has a past medical history of Arthritis, Hepatitis, Hypertension, Subarachnoid hemorrhage (HCC) (2017), and Wears hearing aid in both ears. Surgical: Mr. Buckhalter  has a past surgical history that includes Brain surgery; Joint replacement (Bilateral); Shoulder arthroscopy with rotator cuff repair and open biceps tenodesis (Left, 09/08/2018); and Hernia repair (1963). Family: family history is not on  file.  Laboratory Chemistry Profile   Renal Lab Results  Component Value Date   BUN 18 09/04/2018   CREATININE 0.81 09/04/2018   GFRAA >60 09/04/2018   GFRNONAA >60 09/04/2018     Hepatic Lab Results  Component Value Date   AST 18 09/04/2018   ALT 20 09/04/2018   ALBUMIN 4.4 09/04/2018   ALKPHOS 39 09/04/2018     Electrolytes Lab Results  Component Value Date   NA 138 09/04/2018   K 3.9 09/04/2018   CL 105 09/04/2018   CALCIUM 9.2 09/04/2018     Bone No results found for: "VD25OH", "VD125OH2TOT", "BU0370DU4", "RC3818MC3", "25OHVITD1", "25OHVITD2", "25OHVITD3", "TESTOFREE", "TESTOSTERONE"   Inflammation (CRP: Acute Phase) (ESR: Chronic Phase) No results found for: "CRP", "ESRSEDRATE", "LATICACIDVEN"     Note: Above Lab results reviewed.  Recent Imaging Review  DG Chest Port 1 View CLINICAL DATA:  Larey Seat out of bed onto a concrete floor 4 days ago. Left chest wall pain.  EXAM: PORTABLE CHEST 1 VIEW  COMPARISON:  08/26/2022 at 12:51 p.m.  FINDINGS: Small  left apical pneumothorax, somewhat less defined than on the earlier exam, but overall unchanged.  Basilar lung opacities, left greater than right are stable consistent with atelectasis. Trace pleural effusions are stable. Remainder of the lungs is clear.  No visualized rib fracture.  IMPRESSION: 1. No change from the exam obtained earlier today. 2. Small persistent left apical pneumothorax. Mild, left greater than right, basilar atelectasis with suspected small effusions.  Electronically Signed   By: Amie Portland M.D.   On: 08/26/2022 15:58 DG Chest 1 View CLINICAL DATA:  Pneumothorax.  EXAM: CHEST  1 VIEW  COMPARISON:  08/26/2022  FINDINGS: Stable to minimally decreased left apical pneumothorax. Bibasilar atelectasis, left greater than right, similar to prior. Probable trace bilateral pleural effusions. Cardiopericardial silhouette is at upper limits of normal for size. Telemetry leads overlie the chest.  IMPRESSION: 1. Stable to minimally decreased left apical pneumothorax. 2. Persistent bibasilar atelectasis with probable trace bilateral pleural effusions.  Electronically Signed   By: Kennith Center M.D.   On: 08/26/2022 13:15 DG Chest 2 View CLINICAL DATA:  Larey Seat from a bed onto a concrete floor 4 days ago. Left-sided chest wall pain.  EXAM: CHEST - 2 VIEW  COMPARISON:  None Available.  FINDINGS: Small left apical pneumothorax.  No right pneumothorax.  Linear opacity at the left lung base consistent with atelectasis. Remainder of the lungs is clear.  Cardiac silhouette normal in size. No mediastinal or hilar masses or evidence of adenopathy.  Nondisplaced fracture of the anterolateral left fifth rib.  IMPRESSION: 1. Small left pneumothorax. Nondisplaced fracture of the anterolateral left fifth rib. No other acute abnormality. 2. Mild left lung base atelectasis.  Electronically Signed   By: Amie Portland M.D.   On: 08/26/2022 09:55  Note:  Reviewed        CLINICAL DATA:  Right shoulder pain and limited range of motion for 6 months. No known injury.   EXAM: MRI OF THE RIGHT SHOULDER WITHOUT CONTRAST   TECHNIQUE: Multiplanar, multisequence MR imaging of the shoulder was performed. No intravenous contrast was administered.   COMPARISON:  None.   FINDINGS: Rotator cuff: There is extensive heterogeneous increased T2 signal and thickening of the rotator cuff tendons consistent with severe tendinopathy. Tendinopathy is worst in supraspinatus where virtually no normal-appearing tendon is seen.   Muscles: Intermediate intensity edema is seen in the supraspinatus muscle belly and there is very mild atrophy of the  supraspinatus.   Biceps long head: There is severe tendinopathy of both the intra and extra-articular segments, worse in the intra-articular segment.   Acromioclavicular Joint: Moderately severe degenerative change is present. Type 1 acromion. There is subacromial spurring. A large volume of fluid is seen in the subacromial/subdeltoid bursa   Glenohumeral Joint: Mild degenerative change is seen. Prominent fluid is noted in the subscapularis recess.   Labrum: The superior labrum is degenerated but no tear is identified.   Bones:  No fracture or focal lesion.   Other: None.   IMPRESSION: Severe rotator cuff tendinopathy is worst in the supraspinatus where virtually no normal-appearing tendon is identified but no focal tear is seen. There is mild atrophy of the supraspinatus muscle belly.   Tendinopathy of the intra and extra-articular long head of biceps is severe in the intra-articular segment.   Moderately severe acromioclavicular osteoarthritis. Subacromial spurring is noted.   Large volume of subacromial/subdeltoid fluid consistent with bursitis.     Electronically Signed   By: Drusilla Kanner M.D.   On: 11/21/2017 09:08    Physical Exam  General appearance: Well nourished, well developed,  and well hydrated. In no apparent acute distress Mental status: Alert, oriented x 3 (person, place, & time)       Respiratory: No evidence of acute respiratory distress Eyes: PERLA Vitals: BP (!) 140/96 (BP Location: Left Arm, Patient Position: Sitting, Cuff Size: Large)   Pulse 86   Temp (!) 97.1 F (36.2 C) (Temporal)   Resp 16   Ht 6' (1.829 m)   Wt 210 lb (95.3 kg)   SpO2 97%   BMI 28.48 kg/m  BMI: Estimated body mass index is 28.48 kg/m as calculated from the following:   Height as of this encounter: 6' (1.829 m).   Weight as of this encounter: 210 lb (95.3 kg). Ideal: Ideal body weight: 77.6 kg (171 lb 1.2 oz) Adjusted ideal body weight: 84.7 kg (186 lb 10.3 oz)  Left intercostal pain worse with inspiration  Lumbar Spine Area Exam  Skin & Axial Inspection: No masses, redness, or swelling Alignment: Symmetrical Functional ROM: Unrestricted ROM       Stability: No instability detected Muscle Tone/Strength: Functionally intact. No obvious neuro-muscular anomalies detected. Sensory (Neurological): Musculoskeletal pain pattern pain with facet loading, Left > right   5 out of 5 strength bilateral lower extremity: Plantar flexion, dorsiflexion, knee flexion, knee extension.    Assessment   Diagnosis  1. Intercostal neuralgia   2. Closed fracture of one rib of left side with routine healing, subsequent encounter   3. Encounter for long-term opiate analgesic use   4. Chronic pain syndrome       Plan of Care   Mr. LAYMOND POSTLE has a current medication list which includes the following long-term medication(s): amlodipine, citalopram, and trazodone.   Pharmacotherapy (Medications Ordered): Meds ordered this encounter  Medications   oxyCODONE-acetaminophen (PERCOCET) 5-325 MG tablet    Sig: Take 1 tablet by mouth every 6 (six) hours as needed for severe pain. Must last 30 days.    Dispense:  120 tablet    Refill:  0    Chronic Pain: STOP Act (Not applicable) Fill  1 day early if closed on refill date. Avoid benzodiazepines within 8 hours of opioids   oxyCODONE-acetaminophen (PERCOCET) 5-325 MG tablet    Sig: Take 1 tablet by mouth every 8 (eight) hours as needed for severe pain. Must last 30 days.    Dispense:  90 tablet  Refill:  0    Chronic Pain: STOP Act (Not applicable) Fill 1 day early if closed on refill date. Avoid benzodiazepines within 8 hours of opioids   oxyCODONE-acetaminophen (PERCOCET) 5-325 MG tablet    Sig: Take 1 tablet by mouth every 8 (eight) hours as needed for severe pain. Must last 30 days.    Dispense:  90 tablet    Refill:  0    Chronic Pain: STOP Act (Not applicable) Fill 1 day early if closed on refill date. Avoid benzodiazepines within 8 hours of opioids    Plan for intercostal nerve block for intercostal neuralgia secondary to rib fracture. Orders Placed This Encounter  Procedures   INTERCOSTAL NERVE BLOCK    Standing Status:   Future    Standing Expiration Date:   12/05/2022    Scheduling Instructions:     Side: LEFT      Sedation: local only     Timeframe: ASAA    Order Specific Question:   Where will this procedure be performed?    Answer:   [facility]    Follow-up plan:   Return in about 13 days (around 09/17/2022) for Left T4, 5, 6 ICNB, in clinic NS (monday afternoon at 2 pm).     Status post bilateral L3, L4, L5, S1 facet  medial branch nerve blocks #1 on 06/01/2019 50% pain relief for 3 days, consider repeating in future or consider sprint peripheral nerve stimulation of medial branch at L4 , s/p right glenohumeral joint injection 09/14/2020.      Recent Visits No visits were found meeting these conditions. Showing recent visits within past 90 days and meeting all other requirements Today's Visits Date Type Provider Dept  09/04/22 Office Visit Charles Jolly, MD [facility]  Showing today's visits and meeting all other requirements Future Appointments No visits were found meeting  these conditions. Showing future appointments within next 90 days and meeting all other requirements  I discussed the assessment and treatment plan with the patient. The patient was provided an opportunity to ask questions and all were answered. The patient agreed with the plan and demonstrated an understanding of the instructions.  Patient advised to call back or seek an in-person evaluation if the symptoms or condition worsens.  Duration of encounter: 30 minutes.  Note by: Charles Jolly, MD Date: 09/04/2022; Time: 9:12 AM

## 2022-09-05 ENCOUNTER — Telehealth: Payer: Self-pay | Admitting: Student in an Organized Health Care Education/Training Program

## 2022-09-05 ENCOUNTER — Encounter: Payer: Self-pay | Admitting: Student in an Organized Health Care Education/Training Program

## 2022-09-05 NOTE — Telephone Encounter (Signed)
Patient says he received a phone call from a live person telling him his appt is scheduled for 09-10-22.  He is in the computer for 09-17-22 at 2pm. I checked this has not been changed.  He wants to speak with a nurse or Dr Cherylann Ratel.

## 2022-09-05 NOTE — Telephone Encounter (Signed)
Confirmed dates with patient

## 2022-09-05 NOTE — Telephone Encounter (Signed)
I spoke with [name], he told me that you told him his procedure would be done at 1400 on 09-10-22. Your return instructions specifically say 13 days on 09-17-22.  He wants it done on 7/22, will you do this?

## 2022-09-07 ENCOUNTER — Ambulatory Visit (INDEPENDENT_AMBULATORY_CARE_PROVIDER_SITE_OTHER): Payer: Medicare Other | Admitting: Podiatry

## 2022-09-07 ENCOUNTER — Encounter: Payer: Self-pay | Admitting: Podiatry

## 2022-09-07 DIAGNOSIS — M7671 Peroneal tendinitis, right leg: Secondary | ICD-10-CM | POA: Diagnosis not present

## 2022-09-07 DIAGNOSIS — M722 Plantar fascial fibromatosis: Secondary | ICD-10-CM | POA: Diagnosis not present

## 2022-09-07 MED ORDER — BETAMETHASONE SOD PHOS & ACET 6 (3-3) MG/ML IJ SUSP
3.0000 mg | Freq: Once | INTRAMUSCULAR | Status: AC
Start: 2022-09-07 — End: 2022-09-07
  Administered 2022-09-07: 3 mg via INTRA_ARTICULAR

## 2022-09-07 MED ORDER — MELOXICAM 15 MG PO TABS: 15.0000 mg | ORAL_TABLET | Freq: Every day | ORAL | 1 refills | Status: DC

## 2022-09-07 NOTE — Progress Notes (Signed)
   Chief Complaint  Patient presents with   Foot Pain    "They're okay."    Subjective: 67 y.o. male presenting today for f/u evaluation of bilateral foot pain. Pt states injections help significantly. He has been trying to wear good shoes   Past Medical History:  Diagnosis Date   Arthritis    Hepatitis    c 2000   Hypertension    Subarachnoid hemorrhage (HCC) 2017   Wears hearing aid in both ears    Past Surgical History:  Procedure Laterality Date   BRAIN SURGERY     [facility] COILING 2017   HERNIA REPAIR  1963   double hernia repair   JOINT REPLACEMENT Bilateral    TKR  L-2012, R-2015   SHOULDER ARTHROSCOPY WITH ROTATOR CUFF REPAIR AND OPEN BICEPS TENODESIS Left 09/08/2018   Procedure: LEFT SHOULDER ARTHROSCOPY,SUBSACAPULARIS REPAIR, SUBACROMIAL DECOMP, DISTAL CLAVICLE EXCISION,BICEP TENODESIS, MINI OPEN REGENTEN PATCH APPLICATION;  Surgeon: Signa Kell, MD;  Location: [facility];  Service: Orthopedics;  Laterality: Left;   Allergies  Allergen Reactions   Sulfa Antibiotics Hives   Objective: Physical Exam General: The patient is alert and oriented x3 in no acute distress.  Dermatology: Skin is warm, dry and supple bilateral lower extremities. Negative for open lesions or macerations bilateral.   Vascular: Dorsalis Pedis and Posterior Tibial pulses palpable bilateral.  Capillary fill time is immediate to all digits.  Neurological: Grossly intact via light touch  Musculoskeletal: Tenderness to palpation to the plantar aspect of the left heel along the plantar fascia and fifth metatarsal tubercle area of the right foot. All other joints range of motion within normal limits bilateral. Strength 5/5 in all groups bilateral.   Radiographic exam RT foot 04/16/2022: Normal osseous mineralization.  Degenerative changes noted with varus particular spurring throughout the foot.  No acute fractures identified  Assessment: 1. Plantar fasciitis left foot; recurrent 2.  Insertional  peroneal tendinitis right  Plan of Care:  - Patient evaluated.  - Injection of 0.5cc Celestone soluspan injected into the left plantar fascia and peroneal tendon sheath right.  - refillRx for Meloxicam ordered for patient. - Patient is currently on pain management and taking prescribed hydrocodone.   - Return to clinic as needed.    Felecia Shelling, DPM [facility]  Dr. Felecia Shelling, DPM    [address] [place], [place] [address]                Office [phone]  Fax [phone]

## 2022-09-12 ENCOUNTER — Ambulatory Visit
Admission: RE | Admit: 2022-09-12 | Discharge: 2022-09-12 | Disposition: A | Discharge status: Home / Self Care | Payer: Medicare Other | Source: Ambulatory Visit | Attending: Student in an Organized Health Care Education/Training Program | Admitting: Student in an Organized Health Care Education/Training Program

## 2022-09-12 ENCOUNTER — Ambulatory Visit
Payer: Medicare Other | Attending: Student in an Organized Health Care Education/Training Program | Admitting: Student in an Organized Health Care Education/Training Program

## 2022-09-12 ENCOUNTER — Encounter: Payer: Self-pay | Admitting: Student in an Organized Health Care Education/Training Program

## 2022-09-12 VITALS — BP 141/98 | HR 81 | Temp 98.1°F | Resp 20 | Ht 72.0 in | Wt 210.0 lb

## 2022-09-12 DIAGNOSIS — S2232XD Fracture of one rib, left side, subsequent encounter for fracture with routine healing: Secondary | ICD-10-CM | POA: Diagnosis not present

## 2022-09-12 DIAGNOSIS — G588 Other specified mononeuropathies: Secondary | ICD-10-CM | POA: Diagnosis not present

## 2022-09-12 MED ORDER — ROPIVACAINE HCL 2 MG/ML IJ SOLN
9.0000 mL | Freq: Once | INTRAMUSCULAR | Status: AC
  Administered 2022-09-12: 9 mL via PERINEURAL
  Filled 2022-09-12: qty 20

## 2022-09-12 MED ORDER — IOHEXOL 180 MG/ML  SOLN
10.0000 mL | Freq: Once | INTRAMUSCULAR | Status: AC
  Administered 2022-09-12: 10 mL via INTRA_ARTICULAR
  Filled 2022-09-12: qty 20

## 2022-09-12 MED ORDER — LIDOCAINE HCL 2 % IJ SOLN
20.0000 mL | Freq: Once | INTRAMUSCULAR | Status: AC
  Administered 2022-09-12: 200 mg
  Filled 2022-09-12: qty 20

## 2022-09-12 MED ORDER — DEXAMETHASONE SODIUM PHOSPHATE 10 MG/ML IJ SOLN
10.0000 mg | Freq: Once | INTRAMUSCULAR | Status: AC
  Administered 2022-09-12: 10 mg
  Filled 2022-09-12: qty 1

## 2022-09-12 NOTE — Patient Instructions (Signed)

## 2022-09-12 NOTE — Progress Notes (Signed)
Safety precautions to be maintained throughout the outpatient stay will include: orient to surroundings, keep bed in low position, maintain call bell within reach at all times, provide assistance with transfer out of bed and ambulation.  

## 2022-09-12 NOTE — Progress Notes (Signed)
PROVIDER NOTE: Interpretation of information contained herein should be left to medically-trained personnel. Specific patient instructions are provided elsewhere under "Patient Instructions" section of medical record. This document was created in part using STT-dictation technology, any transcriptional errors that may result from this process are unintentional.  Patient: Charles Mooney Type: Established DOB: [birth year] MRN: [id number] PCP: Enid Baas, MD  Service: Procedure DOS: 09/12/2022 Setting: Ambulatory Location: Ambulatory outpatient facility Delivery: Face-to-face Provider: Edward Jolly, MD Specialty: Interventional Pain Management Specialty designation: 09 Location: Outpatient facility Ref. Prov.: Enid Baas, MD       Interventional Therapy   Primary Reason for Visit: Interventional Pain Management Treatment. CC: Chest Pain (Left side)    Procedure:          Anesthesia, Analgesia, Anxiolysis:  Type: Diagnostic Posterolateral Intercostal  Nerve Block           Region: Posterolateral  Thoracic Area Level: Left 4,5,6  Ribs Laterality: Left-Sided  Anesthesia: Local (1-2% Lidocaine)  Guidance: Fluoroscopy           Position: Prone   1. Intercostal neuralgia   2. Closed fracture of one rib of left side with routine healing, subsequent encounter    NAS-11 Pain score:   Pre-procedure: 5 /10   Post-procedure: 3 /10     H&P (Pre-op Assessment):  [name] is a 67 y.o. (year old), male patient, seen today for interventional treatment. He  has a past surgical history that includes Brain surgery; Joint replacement (Bilateral); Shoulder arthroscopy with rotator cuff repair and open biceps tenodesis (Left, 09/08/2018); and Hernia repair (1963). [name] has a current medication list which includes the following prescription(s): amlodipine, baclofen, citalopram, cyclobenzaprine, meloxicam, NON FORMULARY, oxycodone-acetaminophen, [START ON 10/04/2022]  oxycodone-acetaminophen, [START ON 11/03/2022] oxycodone-acetaminophen, and trazodone. His primarily concern today is the Chest Pain (Left side)  Initial Vital Signs:  Pulse/HCG Rate: 81ECG Heart Rate: 78 Temp: 98.1 F (36.7 C) Resp: 16 BP: 104/71 SpO2: 100 %  BMI: Estimated body mass index is 28.48 kg/m as calculated from the following:   Height as of this encounter: 6' (1.829 m).   Weight as of this encounter: 210 lb (95.3 kg).  Risk Assessment: Allergies: Reviewed. He is allergic to sulfa antibiotics.  Allergy Precautions: None required Coagulopathies: Reviewed. None identified.  Blood-thinner therapy: None at this time Active Infection(s): Reviewed. None identified. [name] is afebrile  Site Confirmation: [name] was asked to confirm the procedure and laterality before marking the site Procedure checklist: Completed Consent: Before the procedure and under the influence of no sedative(s), amnesic(s), or anxiolytics, the patient was informed of the treatment options, risks and possible complications. To fulfill our ethical and legal obligations, as recommended by the American Medical Association's Code of Ethics, I have informed the patient of my clinical impression; the nature and purpose of the treatment or procedure; the risks, benefits, and possible complications of the intervention; the alternatives, including doing nothing; the risk(s) and benefit(s) of the alternative treatment(s) or procedure(s); and the risk(s) and benefit(s) of doing nothing. The patient was provided information about the general risks and possible complications associated with the procedure. These may include, but are not limited to: failure to achieve desired goals, infection, bleeding, organ or nerve damage, allergic reactions, paralysis, and death. In addition, the patient was informed of those risks and complications associated to the procedure, such as failure to decrease pain; infection; bleeding;  organ or nerve damage with subsequent damage to sensory, motor, and/or autonomic systems, resulting in permanent  pain, numbness, and/or weakness of one or several areas of the body; allergic reactions; (i.e.: anaphylactic reaction); and/or death. Furthermore, the patient was informed of those risks and complications associated with the medications. These include, but are not limited to: allergic reactions (i.e.: anaphylactic or anaphylactoid reaction(s)); adrenal axis suppression; blood sugar elevation that in diabetics may result in ketoacidosis or comma; water retention that in patients with history of congestive heart failure may result in shortness of breath, pulmonary edema, and decompensation with resultant heart failure; weight gain; swelling or edema; medication-induced neural toxicity; particulate matter embolism and blood vessel occlusion with resultant organ, and/or nervous system infarction; and/or aseptic necrosis of one or more joints. Finally, the patient was informed that Medicine is not an exact science; therefore, there is also the possibility of unforeseen or unpredictable risks and/or possible complications that may result in a catastrophic outcome. The patient indicated having understood very clearly. We have given the patient no guarantees and we have made no promises. Enough time was given to the patient to ask questions, all of which were answered to the patient's satisfaction. [name] has indicated that he wanted to continue with the procedure. Attestation: I, the ordering provider, attest that I have discussed with the patient the benefits, risks, side-effects, alternatives, likelihood of achieving goals, and potential problems during recovery for the procedure that I have provided informed consent. Date  Time: 09/12/2022  1:51 PM  Pre-Procedure Preparation:  Monitoring: As per clinic protocol. Respiration, ETCO2, SpO2, BP, heart rate and rhythm monitor placed and checked for  adequate function Safety Precautions: Patient was assessed for positional comfort and pressure points before starting the procedure. Time-out: I initiated and conducted the "Time-out" before starting the procedure, as per protocol. The patient was asked to participate by confirming the accuracy of the "Time Out" information. Verification of the correct person, site, and procedure were performed and confirmed by me, the nursing staff, and the patient. "Time-out" conducted as per Joint Commission's Universal Protocol (UP.01.01.01). Time: 1438 Start Time: 1438 hrs.  Description of Procedure:          Target Area: The sub-costal neurovascular bundle area Approach: Sub-costal approach Area Prepped: Entire Posterolateral  Thoracic Region DuraPrep (Iodine Povacrylex [0.7% available iodine] and Isopropyl Alcohol, 74% w/w) Safety Precautions: Aspiration looking for blood return was conducted prior to all injections. At no point did we inject any substances, as a needle was being advanced. No attempts were made at seeking any paresthesias. Safe injection practices and needle disposal techniques used. Medications properly checked for expiration dates. SDV (single dose vial) medications used. Description of the Procedure: Protocol guidelines were followed. The patient was placed in position over the procedure table. The target area was identified and the area prepped in the usual manner. Skin & deeper tissues infiltrated with local anesthetic. After cleaning the skin with an antiseptic solution, 1-2 mL of dilute local anesthetic was infiltrated subcutaneously at the planned injection site. The fingers of the palpating hand were used to straddle the insertion site at the inferior border of the rib and fix the skin to avoid unwanted skin movement. Appropriate amount of time allowed to pass for local anesthetics to take effect. The procedure needles were then advanced to the target area at an angle of approximately 20  cephalad to the skin. Contact with the rib was made. While maintaining the same angle of insertion, the needle was walked off the inferior border of the rib as the skin was allowed to return to  its initial position. Then the needle was advanced no more than 3 mm below the inferior margin of the rib. Proper needle placement was secured. Negative aspiration confirmed. Following negative aspiration for blood or air, 3-5 mL of local anesthetic was injected. The solution injected in intermittent fashion, asking for systemic symptoms every 0.5cc of injectate. The needle was then removed and the area cleansed, making sure to leave some of the prepping solution back to take advantage of its long term bactericidal properties. Vitals:   09/12/22 1434 09/12/22 1439 09/12/22 1444 09/12/22 1447  BP: (!) 136/95 (!) 143/97 (!) 147/98 (!) 141/98  Pulse:      Resp: (!) 8 16 16 20   Temp:      SpO2: 94% 96% 97% 96%  Weight:      Height:        Start Time: 1438 hrs. End Time: 1447 hrs.   Materials:  Needle(s) Type: Regular needle Gauge: 22G Length: 3.5-in Medication(s): Please see orders for medications and dosing details.  9 cc solution made of 8 cc of 0.2% ropivacaine, 1 cc of Decadron 10 mg/cc.  3 cc injected for the left fourth, fifth and sixth intercostal nerves under live fluoroscopy.  Imaging Guidance (Non-Spinal):          Type of Imaging Technique: Fluoroscopy Guidance (Non-Spinal) Indication(s): Assistance in needle guidance and placement for procedures requiring needle placement in or near specific anatomical locations not easily accessible without such assistance. Exposure Time: Please see nurses notes. Contrast: Before injecting any contrast, we confirmed that the patient did not have an allergy to iodine, shellfish, or radiological contrast. Once satisfactory needle placement was completed at the desired level, radiological contrast was injected. Contrast injected under live fluoroscopy. No  contrast complications. See chart for type and volume of contrast used. Fluoroscopic Guidance: I was personally present during the use of fluoroscopy. "Tunnel Vision Technique" used to obtain the best possible view of the target area. Parallax error corrected before commencing the procedure. "Direction-depth-direction" technique used to introduce the needle under continuous pulsed fluoroscopy. Once target was reached, antero-posterior, oblique, and lateral fluoroscopic projection used confirm needle placement in all planes. Images permanently stored in EMR. Interpretation: I personally interpreted the imaging intraoperatively. Adequate needle placement confirmed in multiple planes. Appropriate spread of contrast into desired area was observed. No evidence of afferent or efferent intravascular uptake. Permanent images saved into the patient's record.  Antibiotic Prophylaxis:   Anti-infectives (From admission, onward)    None      Indication(s): None identified  Post-operative Assessment:  Post-procedure Vital Signs:  Pulse/HCG Rate: 8177 Temp: 98.1 F (36.7 C) Resp: 20 BP: (!) 141/98 SpO2: 96 %  EBL: None  Complications: No immediate post-treatment complications observed by team, or reported by patient.  Note: The patient tolerated the entire procedure well. A repeat set of vitals were taken after the procedure and the patient was kept under observation following institutional policy, for this type of procedure. Post-procedural neurological assessment was performed, showing return to baseline, prior to discharge. The patient was provided with post-procedure discharge instructions, including a section on how to identify potential problems. Should any problems arise concerning this procedure, the patient was given instructions to immediately contact us, at any time, without hesitation. In any case, we plan to contact the patient by telephone for a follow-up status report regarding this  interventional procedure.  Comments:  No additional relevant information.  Plan of Care (POC)  Orders:  Orders Placed This Encounter  Procedures  DG PAIN CLIN[facility] C-ARM 1-60 MIN NO REPORT    Intraoperative interpretation by procedural physician at [facility].    Standing Status:   Standing    Number of Occurrences:   1    Order Specific Question:   Reason for exam:    Answer:   Assistance in needle guidance and placement for procedures requiring needle placement in or near specific anatomical locations not easily accessible without such assistance.   Chronic Opioid Analgesic:   Percocet 5 mg TID prn   Medications ordered for procedure: Meds ordered this encounter  Medications   iohexol (OMNIPAQUE) 180 MG/ML injection 10 mL    Must be Myelogram-compatible. If not available, you may substitute with a water-soluble, non-ionic, hypoallergenic, myelogram-compatible radiological contrast medium.   lidocaine (XYLOCAINE) 2 % (with pres) injection 400 mg   dexamethasone (DECADRON) injection 10 mg   ropivacaine (PF) 2 mg/mL (0.2%) (NAROPIN) injection 9 mL   Medications administered: We administered iohexol, lidocaine, dexamethasone, and ropivacaine (PF) 2 mg/mL (0.2%).  See the medical record for exact dosing, route, and time of administration.  Follow-up plan:   Return in about 3 weeks (around 10/03/2022) for PPE VV.       Status post bilateral L3, L4, L5, S1 facet  medial branch nerve blocks #1 on 06/01/2019 50% pain relief for 3 days, consider repeating in future or consider sprint peripheral nerve stimulation of medial branch at L4 , s/p right glenohumeral joint injection 09/14/2020.        Recent Visits Date Type Provider Dept  09/04/22 Office Visit Edward Jolly, MD [facility]  Showing recent visits within past 90 days and meeting all other requirements Today's Visits Date Type Provider Dept  09/12/22 Procedure visit Edward Jolly, MD [facility]   Showing today's visits and meeting all other requirements Future Appointments Date Type Provider Dept  10/03/22 Appointment Edward Jolly, MD [facility]  11/27/22 Appointment Edward Jolly, MD [facility]  Showing future appointments within next 90 days and meeting all other requirements  Disposition: Discharge home  Discharge (Date  Time): 09/12/2022; 1455 hrs.   Primary Care Physician: Enid Baas, MD Location: [facility] Note by: Edward Jolly, MD (TTS technology used. I apologize for any typographical errors that were not detected and corrected.) Date: 09/12/2022; Time: 3:49 PM  Disclaimer:  Medicine is not an Visual merchandiser. The only guarantee in medicine is that nothing is guaranteed. It is important to note that the decision to proceed with this intervention was based on the information collected from the patient. The Data and conclusions were drawn from the patient's questionnaire, the interview, and the physical examination. Because the information was provided in large part by the patient, it cannot be guaranteed that it has not been purposely or unconsciously manipulated. Every effort has been made to obtain as much relevant data as possible for this evaluation. It is important to note that the conclusions that lead to this procedure are derived in large part from the available data. Always take into account that the treatment will also be dependent on availability of resources and existing treatment guidelines, considered by other Pain Management Practitioners as being common knowledge and practice, at the time of the intervention. For Medico-Legal purposes, it is also important to point out that variation in procedural techniques and pharmacological choices are the acceptable norm. The indications, contraindications, technique, and results of the above procedure should only be interpreted and judged by a Board-Certified  Interventional Pain Specialist with extensive familiarity and expertise in the same exact procedure and technique.

## 2022-09-13 ENCOUNTER — Telehealth: Payer: Self-pay

## 2022-09-13 NOTE — Telephone Encounter (Signed)
Post procedure follow up..  Patient states he is doing good 

## 2022-09-17 ENCOUNTER — Ambulatory Visit: Payer: Medicare Other | Admitting: Student in an Organized Health Care Education/Training Program

## 2022-10-03 ENCOUNTER — Encounter: Payer: Self-pay | Admitting: Student in an Organized Health Care Education/Training Program

## 2022-10-03 ENCOUNTER — Ambulatory Visit
Payer: Medicare Other | Attending: Student in an Organized Health Care Education/Training Program | Admitting: Student in an Organized Health Care Education/Training Program

## 2022-10-03 DIAGNOSIS — G588 Other specified mononeuropathies: Secondary | ICD-10-CM

## 2022-10-03 DIAGNOSIS — S2232XD Fracture of one rib, left side, subsequent encounter for fracture with routine healing: Secondary | ICD-10-CM | POA: Diagnosis not present

## 2022-10-03 DIAGNOSIS — G894 Chronic pain syndrome: Secondary | ICD-10-CM

## 2022-10-03 MED ORDER — BACLOFEN 10 MG PO TABS
10.0000 mg | ORAL_TABLET | Freq: Every evening | ORAL | 0 refills | Status: DC | PRN
Start: 2022-10-03 — End: 2023-05-23

## 2022-10-03 NOTE — Progress Notes (Signed)
Patient: Charles Mooney  Service Category: E/M  Provider: Edward Jolly, MD  DOB: [birth year]  DOS: 10/03/2022  Location: Office  MRN: [id number]  Setting: Ambulatory outpatient  Referring Provider: Enid Baas, MD  Type: Established Patient  Specialty: Interventional Pain Management  PCP: Enid Baas, MD  Location: Remote location  Delivery: TeleHealth     Virtual Encounter - Pain Management PROVIDER NOTE: Information contained herein reflects review and annotations entered in association with encounter. Interpretation of such information and data should be left to medically-trained personnel. Information provided to patient can be located elsewhere in the medical record under "Patient Instructions". Document created using STT-dictation technology, any transcriptional errors that may result from process are unintentional.    Contact & Pharmacy Preferred: [phone] Home: [phone] (home) Mobile: [phone] (mobile) E-mail: [email or web address]  [facility] - [place], [place] - [address] [place] [address] Phone: [phone] Fax: [phone]   Pre-screening  Mr. Streater offered "in-person" vs "virtual" encounter. He indicated preferring virtual for this encounter.   Reason COVID-19*  Social distancing based on CDC and AMA recommendations.   I contacted [name] on 10/03/2022 via telephone.      I clearly identified myself as Edward Jolly, MD. I verified that I was speaking with the correct person using two identifiers (Name: CALUB BAILIN, and date of birth: May 13, 1955).  Consent I sought verbal advanced consent from [name] for virtual visit interactions. I informed Mr. Hasbrook of possible security and privacy concerns, risks, and limitations associated with providing "not-in-person" medical evaluation and management services. I also informed Mr. Feinberg of the availability of "in-person" appointments. Finally, I informed him that  there would be a charge for the virtual visit and that he could be  personally, fully or partially, financially responsible for it. Mr. Mccowan expressed understanding and agreed to proceed.   Historic Elements   Mr. KAHMANI PACIFIC is a 67 y.o. year old, male patient evaluated today after our last contact on 09/12/2022. Mr. Crofts  has a past medical history of Arthritis, Hepatitis, Hypertension, Subarachnoid hemorrhage (HCC) (2017), and Wears hearing aid in both ears. He also  has a past surgical history that includes Brain surgery; Joint replacement (Bilateral); Shoulder arthroscopy with rotator cuff repair and open biceps tenodesis (Left, 09/08/2018); and Hernia repair (1963). Mr. Lowery has a current medication list which includes the following prescription(s): amlodipine, citalopram, cyclobenzaprine, meloxicam, NON FORMULARY, oxycodone-acetaminophen, [START ON 10/04/2022] oxycodone-acetaminophen, [START ON 11/03/2022] oxycodone-acetaminophen, and baclofen. He  reports that he quit smoking about 7 weeks ago. His smoking use included cigarettes. He has never used smokeless tobacco. He reports current alcohol use of about 18.0 standard drinks of alcohol per week. He reports that he does not currently use drugs. Mr. Wiencek is allergic to sulfa antibiotics.  BMI: Estimated body mass index is 28.48 kg/m as calculated from the following:   Height as of 09/12/22: 6' (1.829 m).   Weight as of 09/12/22: 210 lb (95.3 kg). Last encounter: 09/04/2022. Last procedure: 09/12/2022.  HPI  Today, he is being contacted for a post-procedure assessment.   Post-procedure evaluation    Procedure:          Anesthesia, Analgesia, Anxiolysis:  Type: Diagnostic Posterolateral Intercostal  Nerve Block           Region: Posterolateral  Thoracic Area Level: Left 4,5,6  Ribs Laterality: Left-Sided  Anesthesia: Local (1-2% Lidocaine)  Guidance: Fluoroscopy  Position: Prone   1. Intercostal neuralgia   2.  Closed fracture of one rib of left side with routine healing, subsequent encounter    NAS-11 Pain score:   Pre-procedure: 5 /10   Post-procedure: 3 /10      Effectiveness:  Initial hour after procedure: 100 %  Subsequent 4-6 hours post-procedure: 100 %  Analgesia past initial 6 hours: 100 % (procedure seemed to help for a day or two, got him over the hump. was numb x 24 hours but the  pain stayed at bay for longer.  intercostal are much improved.)  Ongoing improvement:  Analgesic: 40%    Pharmacotherapy Assessment   Opioid Analgesic:  Percocet 5 mg TID prn   Monitoring: [place] PMP: PDMP reviewed during this encounter.       Pharmacotherapy: No side-effects or adverse reactions reported. Compliance: No problems identified. Effectiveness: Clinically acceptable. Plan: Refer to "POC". UDS:  Summary  Date Value Ref Range Status  06/05/2022 Note  Final    Comment:    ==================================================================== [employer] 13 (MW) ==================================================================== Test                             Result       Flag       Units  Drug Present and Declared for Prescription Verification   Oxycodone                      717          EXPECTED   ng/mg creat   Oxymorphone                    700          EXPECTED   ng/mg creat   Noroxycodone                   2135         EXPECTED   ng/mg creat   Noroxymorphone                 348          EXPECTED   ng/mg creat    Sources of oxycodone are scheduled prescription medications.    Oxymorphone, noroxycodone, and noroxymorphone are expected    metabolites of oxycodone. Oxymorphone is also available as a    scheduled prescription medication.  ==================================================================== Test                      Result    Flag   Units      Ref Range   Creatinine              23               mg/dL       >=82 ==================================================================== Declared Medications:  The flagging and interpretation on this report are based on the  following declared medications.  Unexpected results may arise from  inaccuracies in the declared medications.   **Note: The testing scope of this panel includes these medications:   Oxycodone (Percocet)   **Note: The testing scope of this panel does not include the  following reported medications:   Acetaminophen (Percocet)  Amlodipine (Norvasc)  Baclofen (Lioresal)  Cannabidiol  Citalopram (Celexa)  Cyclobenzaprine (Flexeril)  Meloxicam (Mobic)  Trazodone (Desyrel) ==================================================================== For clinical consultation, please call [phone]. ====================================================================    No results found for: "CBDTHCR", "  D8THCCBX", "D9THCCBX"   Laboratory Chemistry Profile   Renal Lab Results  Component Value Date   BUN 18 09/04/2018   CREATININE 0.81 09/04/2018   GFRAA >60 09/04/2018   GFRNONAA >60 09/04/2018    Hepatic Lab Results  Component Value Date   AST 18 09/04/2018   ALT 20 09/04/2018   ALBUMIN 4.4 09/04/2018   ALKPHOS 39 09/04/2018    Electrolytes Lab Results  Component Value Date   NA 138 09/04/2018   K 3.9 09/04/2018   CL 105 09/04/2018   CALCIUM 9.2 09/04/2018    Bone No results found for: "VD25OH", "VD125OH2TOT", "ZO1096EA5", "WU9811BJ4", "25OHVITD1", "25OHVITD2", "25OHVITD3", "TESTOFREE", "TESTOSTERONE"  Inflammation (CRP: Acute Phase) (ESR: Chronic Phase) No results found for: "CRP", "ESRSEDRATE", "LATICACIDVEN"       Note: Above Lab results reviewed.   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 Diagnoses of Closed fracture of one rib of left side with routine healing, subsequent encounter and Chronic pain syndrome were also pertinent to this visit.  Plan of Care    Mr. WINSLOW ARTIAGA has a current medication list which includes the following long-term medication(s): amlodipine and citalopram.  Pharmacotherapy (Medications Ordered): Meds ordered this encounter  Medications   baclofen (LIORESAL) 10 MG tablet    Sig: Take 1 tablet (10 mg total) by mouth at bedtime as needed for muscle spasms. Do not take same day as flexeril    Dispense:  90 tablet    Refill:  0     Follow-up plan:   Return for Keep sch. appt.      Status post bilateral L3, L4, L5, S1 facet  medial branch nerve blocks #1 on 06/01/2019 50% pain relief for 3 days, consider repeating in future or consider sprint peripheral nerve stimulation of medial branch at L4 , s/p right glenohumeral joint injection 09/14/2020.         Recent Visits Date Type Provider Dept  09/12/22 Procedure visit Edward Jolly, MD [facility]  09/04/22 Office Visit Edward Jolly, MD [facility]  Showing recent visits within past 90 days and meeting all other requirements Today's Visits Date Type Provider Dept  10/03/22 Office Visit Edward Jolly, MD [facility]  Showing today's visits and meeting all other requirements Future Appointments Date Type Provider Dept  11/27/22 Appointment Edward Jolly, MD [facility]  Showing future appointments within next 90 days and meeting all other requirements  I discussed the assessment and treatment plan with the patient. The patient was provided an opportunity to ask questions and all were answered. The patient agreed with the plan and demonstrated an understanding of the instructions.  Patient advised to call back or seek an in-person evaluation if the symptoms or condition worsens.  Duration of encounter: 15 minutes.  Note by: Edward Jolly, MD Date: 10/03/2022; Time: 3:46 PM

## 2022-11-02 ENCOUNTER — Other Ambulatory Visit: Payer: Self-pay | Admitting: Student in an Organized Health Care Education/Training Program

## 2022-11-02 DIAGNOSIS — G588 Other specified mononeuropathies: Secondary | ICD-10-CM

## 2022-11-02 DIAGNOSIS — G894 Chronic pain syndrome: Secondary | ICD-10-CM

## 2022-11-02 DIAGNOSIS — Z79891 Long term (current) use of opiate analgesic: Secondary | ICD-10-CM

## 2022-11-27 ENCOUNTER — Encounter: Payer: Self-pay | Admitting: Student in an Organized Health Care Education/Training Program

## 2022-11-27 ENCOUNTER — Ambulatory Visit
Payer: Medicare Other | Attending: Student in an Organized Health Care Education/Training Program | Admitting: Student in an Organized Health Care Education/Training Program

## 2022-11-27 DIAGNOSIS — G894 Chronic pain syndrome: Secondary | ICD-10-CM | POA: Insufficient documentation

## 2022-11-27 DIAGNOSIS — Z79891 Long term (current) use of opiate analgesic: Secondary | ICD-10-CM | POA: Diagnosis not present

## 2022-11-27 DIAGNOSIS — G588 Other specified mononeuropathies: Secondary | ICD-10-CM | POA: Diagnosis present

## 2022-11-27 MED ORDER — OXYCODONE-ACETAMINOPHEN 5-325 MG PO TABS
1.0000 | ORAL_TABLET | Freq: Three times a day (TID) | ORAL | 0 refills | Status: AC | PRN
Start: 2023-01-26 — End: 2023-02-25

## 2022-11-27 MED ORDER — OXYCODONE-ACETAMINOPHEN 5-325 MG PO TABS
1.0000 | ORAL_TABLET | Freq: Three times a day (TID) | ORAL | 0 refills | Status: AC | PRN
Start: 2022-12-27 — End: 2023-01-26

## 2022-11-27 MED ORDER — OXYCODONE-ACETAMINOPHEN 5-325 MG PO TABS
1.0000 | ORAL_TABLET | Freq: Four times a day (QID) | ORAL | 0 refills | Status: AC | PRN
Start: 2022-11-28 — End: 2022-12-28

## 2022-11-27 NOTE — Progress Notes (Signed)
PROVIDER NOTE: Information contained herein reflects review and annotations entered in association with encounter. Interpretation of such information and data should be left to medically-trained personnel. Information provided to patient can be located elsewhere in the medical record under "Patient Instructions". Document created using STT-dictation technology, any transcriptional errors that may result from process are unintentional.    Patient: Charles Mooney  Service Category: E/M  Provider: Edward Jolly, MD  DOB: Jun 22, 1955  DOS: 11/27/2022  Specialty: Interventional Pain Management  MRN: [id number]  Setting: Ambulatory outpatient  PCP: Enid Baas, MD  Type: Established Patient    Referring Provider: Enid Baas, MD  Location: Office  Delivery: Face-to-face     HPI  [name], a 67 y.o. year old male, is here today because of his No primary diagnosis found.. Charles Mooney's primary complain today is Back Pain (Right hip and bilateral feet and ankles)  Last encounter: My last encounter with him was on 10/03/2022  Pertinent problems: Charles Mooney has Chronic bilateral low back pain with bilateral sciatica; Acute bursitis of right shoulder; Nonallopathic lesion of sacral region; Lumbar facet arthropathy; Lumbar degenerative disc disease; Localized osteoarthritis of shoulder regions, bilateral; Encounter for long-term opiate analgesic use; Chronic SI joint pain; and Chronic pain syndrome on their pertinent problem list. Pain Assessment: Severity of Chronic pain is reported as a 6 /10. Location: Back Lower/radiates down left leg to knee. Onset: More than a month ago. Quality: Constant, Aching, Burning, Throbbing. Timing: Constant. Modifying factor(s): sgtretching. Vitals:  height is 6' (1.829 m) and weight is 220 lb (99.8 kg). His temporal temperature is 97.2 F (36.2 C) (abnormal). His blood pressure is 152/91 (abnormal) and his pulse is 72. His oxygen saturation is 97%.   Reason  for encounter: medication management.    Charles Mooney presents today for medication management.  He is experiencing increased right hip and bilateral foot and ankle pain.  He is going out of town this Thursday.  He states that he is having to take an extra Percocet for increased pain that he is experiencing.  He is requesting a temporary increase to every 6 hours for the next month and then he is willing to reduce back down to his previous dose of every 8 hours as needed.  Pharmacotherapy Assessment  Analgesic:  Percocet 5 mg TID-QID prn   Monitoring: [place] PMP: PDMP reviewed during this encounter.       Pharmacotherapy: No side-effects or adverse reactions reported. Compliance: No problems identified. Effectiveness: Clinically acceptable.  UDS:  Summary  Date Value Ref Range Status  06/05/2022 Note  Final    Comment:    ==================================================================== [employer] (MW) ==================================================================== Test                             Result       Flag       Units  Drug Present and Declared for Prescription Verification   Oxycodone                      717          EXPECTED   ng/mg creat   Oxymorphone                    700          EXPECTED   ng/mg creat   Noroxycodone  2135         EXPECTED   ng/mg creat   Noroxymorphone                 348          EXPECTED   ng/mg creat    Sources of oxycodone are scheduled prescription medications.    Oxymorphone, noroxycodone, and noroxymorphone are expected    metabolites of oxycodone. Oxymorphone is also available as a    scheduled prescription medication.  ==================================================================== Test                      Result    Flag   Units      Ref Range   Creatinine              23               mg/dL      >=34 ==================================================================== Declared Medications:  The flagging and  interpretation on this report are based on the  following declared medications.  Unexpected results may arise from  inaccuracies in the declared medications.   **Note: The testing scope of this panel includes these medications:   Oxycodone (Percocet)   **Note: The testing scope of this panel does not include the  following reported medications:   Acetaminophen (Percocet)  Amlodipine (Norvasc)  Baclofen (Lioresal)  Cannabidiol  Citalopram (Celexa)  Cyclobenzaprine (Flexeril)  Meloxicam (Mobic)  Trazodone (Desyrel) ==================================================================== For clinical consultation, please call [phone]. ====================================================================       ROS  Constitutional: Denies any fever or chills Gastrointestinal: No reported hemesis, hematochezia, vomiting, or acute GI distress Musculoskeletal:  Right hip pain Neurological: No reported episodes of acute onset apraxia, aphasia, dysarthria, agnosia, amnesia, paralysis, loss of coordination, or loss of consciousness  Medication Review  NON FORMULARY, amLODipine, baclofen, citalopram, cyclobenzaprine, meloxicam, and oxyCODONE-acetaminophen  History Review  Allergy: Charles Mooney is allergic to sulfa antibiotics. Drug: Charles Mooney  reports that he does not currently use drugs. Alcohol:  reports current alcohol use of about 18.0 standard drinks of alcohol per week. Tobacco:  reports that he quit smoking about 3 months ago. His smoking use included cigarettes. He has never used smokeless tobacco. Social: Charles Mooney  reports that he quit smoking about 3 months ago. His smoking use included cigarettes. He has never used smokeless tobacco. He reports current alcohol use of about 18.0 standard drinks of alcohol per week. He reports that he does not currently use drugs. Medical:  has a past medical history of Arthritis, Hepatitis, Hypertension, Subarachnoid hemorrhage (HCC)  (2017), and Wears hearing aid in both ears. Surgical: Charles Mooney  has a past surgical history that includes Brain surgery; Joint replacement (Bilateral); Shoulder arthroscopy with rotator cuff repair and open biceps tenodesis (Left, 09/08/2018); and Hernia repair (1963). Family: family history is not on file.  Laboratory Chemistry Profile   Renal Lab Results  Component Value Date   BUN 18 09/04/2018   CREATININE 0.81 09/04/2018   GFRAA >60 09/04/2018   GFRNONAA >60 09/04/2018     Hepatic Lab Results  Component Value Date   AST 18 09/04/2018   ALT 20 09/04/2018   ALBUMIN 4.4 09/04/2018   ALKPHOS 39 09/04/2018     Electrolytes Lab Results  Component Value Date   NA 138 09/04/2018   K 3.9 09/04/2018   CL 105 09/04/2018   CALCIUM 9.2 09/04/2018     Bone No results found for: "VD25OH", "VD125OH2TOT", "FI4332RJ1", "OA4166AY3", "  25OHVITD1", "25OHVITD2", "25OHVITD3", "TESTOFREE", "TESTOSTERONE"   Inflammation (CRP: Acute Phase) (ESR: Chronic Phase) No results found for: "CRP", "ESRSEDRATE", "LATICACIDVEN"     Note: Above Lab results reviewed.  Recent Imaging Review  DG PAIN CLINIC C-ARM 1-60 MIN NO REPORT Fluoro was used, but no Radiologist interpretation will be provided.  Please refer to "NOTES" tab for provider progress note.  Note: Reviewed        CLINICAL DATA:  Right shoulder pain and limited range of motion for 6 months. No known injury.   EXAM: MRI OF THE RIGHT SHOULDER WITHOUT CONTRAST   TECHNIQUE: Multiplanar, multisequence MR imaging of the shoulder was performed. No intravenous contrast was administered.   COMPARISON:  None.   FINDINGS: Rotator cuff: There is extensive heterogeneous increased T2 signal and thickening of the rotator cuff tendons consistent with severe tendinopathy. Tendinopathy is worst in supraspinatus where virtually no normal-appearing tendon is seen.   Muscles: Intermediate intensity edema is seen in the supraspinatus muscle  belly and there is very mild atrophy of the supraspinatus.   Biceps long head: There is severe tendinopathy of both the intra and extra-articular segments, worse in the intra-articular segment.   Acromioclavicular Joint: Moderately severe degenerative change is present. Type 1 acromion. There is subacromial spurring. A large volume of fluid is seen in the subacromial/subdeltoid bursa   Glenohumeral Joint: Mild degenerative change is seen. Prominent fluid is noted in the subscapularis recess.   Labrum: The superior labrum is degenerated but no tear is identified.   Bones:  No fracture or focal lesion.   Other: None.   IMPRESSION: Severe rotator cuff tendinopathy is worst in the supraspinatus where virtually no normal-appearing tendon is identified but no focal tear is seen. There is mild atrophy of the supraspinatus muscle belly.   Tendinopathy of the intra and extra-articular long head of biceps is severe in the intra-articular segment.   Moderately severe acromioclavicular osteoarthritis. Subacromial spurring is noted.   Large volume of subacromial/subdeltoid fluid consistent with bursitis.     Electronically Signed   By: Drusilla Kanner M.D.   On: 11/21/2017 09:08    Physical Exam  General appearance: Well nourished, well developed, and well hydrated. In no apparent acute distress Mental status: Alert, oriented x 3 (person, place, & time)       Respiratory: No evidence of acute respiratory distress Eyes: PERLA Vitals: BP (!) 152/91   Pulse 72   Temp (!) 97.2 F (36.2 C) (Temporal)   Ht 6' (1.829 m)   Wt 220 lb (99.8 kg)   SpO2 97%   BMI 29.84 kg/m  BMI: Estimated body mass index is 29.84 kg/m as calculated from the following:   Height as of this encounter: 6' (1.829 m).   Weight as of this encounter: 220 lb (99.8 kg). Ideal: Ideal body weight: 77.6 kg (171 lb 1.2 oz) Adjusted ideal body weight: 86.5 kg (190 lb 10.3 oz)    Lumbar Spine Area Exam  Skin  & Axial Inspection: No masses, redness, or swelling Alignment: Symmetrical Functional ROM: Unrestricted ROM       Stability: No instability detected Muscle Tone/Strength: Functionally intact. No obvious neuro-muscular anomalies detected. Sensory (Neurological): Musculoskeletal pain pattern pain with facet loading, right greater than left   Right hip pain  5 out of 5 strength bilateral lower extremity: Plantar flexion, dorsiflexion, knee flexion, knee extension.    Assessment   Diagnosis  1. Intercostal neuralgia   2. Encounter for long-term opiate analgesic use  3. Chronic pain syndrome        Plan of Care   Charles Mooney has a current medication list which includes the following long-term medication(s): amlodipine and citalopram.   Pharmacotherapy (Medications Ordered): Meds ordered this encounter  Medications   oxyCODONE-acetaminophen (PERCOCET) 5-325 MG tablet    Sig: Take 1 tablet by mouth every 6 (six) hours as needed for severe pain. Must last 30 days.    Dispense:  120 tablet    Refill:  0    Chronic Pain: STOP Act (Not applicable) Fill 1 day early if closed on refill date. Avoid benzodiazepines within 8 hours of opioids   oxyCODONE-acetaminophen (PERCOCET) 5-325 MG tablet    Sig: Take 1 tablet by mouth every 8 (eight) hours as needed for severe pain. Must last 30 days.    Dispense:  90 tablet    Refill:  0    Chronic Pain: STOP Act (Not applicable) Fill 1 day early if closed on refill date. Avoid benzodiazepines within 8 hours of opioids   oxyCODONE-acetaminophen (PERCOCET) 5-325 MG tablet    Sig: Take 1 tablet by mouth every 8 (eight) hours as needed for severe pain. Must last 30 days.    Dispense:  90 tablet    Refill:  0    Chronic Pain: STOP Act (Not applicable) Fill 1 day early if closed on refill date. Avoid benzodiazepines within 8 hours of opioids    No orders of the defined types were placed in this encounter.   Follow-up plan:   Return in  about 3 months (around 02/28/2023) for MM, F2F.     Status post bilateral L3, L4, L5, S1 facet  medial branch nerve blocks #1 on 06/01/2019 50% pain relief for 3 days, consider repeating in future or consider sprint peripheral nerve stimulation of medial branch at L4 , s/p right glenohumeral joint injection 09/14/2020.      Recent Visits Date Type Provider Dept  10/03/22 Office Visit Edward Jolly, MD [facility]  09/12/22 Procedure visit Edward Jolly, MD [facility]  09/04/22 Office Visit Edward Jolly, MD [facility]  Showing recent visits within past 90 days and meeting all other requirements Today's Visits Date Type Provider Dept  11/27/22 Office Visit Edward Jolly, MD [facility]  Showing today's visits and meeting all other requirements Future Appointments No visits were found meeting these conditions. Showing future appointments within next 90 days and meeting all other requirements  I discussed the assessment and treatment plan with the patient. The patient was provided an opportunity to ask questions and all were answered. The patient agreed with the plan and demonstrated an understanding of the instructions.  Patient advised to call back or seek an in-person evaluation if the symptoms or condition worsens.  Duration of encounter: 30 minutes.  Note by: Edward Jolly, MD Date: 11/27/2022; Time: 9:38 AM

## 2022-11-27 NOTE — Progress Notes (Signed)
Safety precautions to be maintained throughout the outpatient stay will include: orient to surroundings, keep bed in low position, maintain call bell within reach at all times, provide assistance with transfer out of bed and ambulation.   Nursing Pain Medication Assessment:  Safety precautions to be maintained throughout the outpatient stay will include: orient to surroundings, keep bed in low position, maintain call bell within reach at all times, provide assistance with transfer out of bed and ambulation.  Medication Inspection Compliance: Pill count conducted under aseptic conditions, in front of the patient. Neither the pills nor the bottle was removed from the patient's sight at any time. Once count was completed pills were immediately returned to the patient in their original bottle.  Medication: Oxycodone/APAP Pill/Patch Count:  14 of 90 pills remain Pill/Patch Appearance: Markings consistent with prescribed medication Bottle Appearance: Standard pharmacy container. Clearly labeled. Filled Date: 67 / 14 / 2024 Last Medication intake:  Yesterday

## 2022-11-28 ENCOUNTER — Telehealth: Payer: Self-pay | Admitting: Student in an Organized Health Care Education/Training Program

## 2022-11-28 NOTE — Telephone Encounter (Signed)
Patient needs to pick up script for Oxycodone today, he is leaving town tomorrow. The pharmacy says he cannot pick up until Saturday.

## 2022-11-28 NOTE — Telephone Encounter (Signed)
Prescription says it may be filled today, but he filled it late last month, so 30 days will be Saturday.  May he fill it today, 3 days early?

## 2022-11-28 NOTE — Telephone Encounter (Signed)
Prescription says it may be filled today, but he filled it late last month, so 30 days will be Saturday.  May he fill it today, 3 days early?      Note

## 2022-11-28 NOTE — Telephone Encounter (Signed)
Pharmacy and patient informed

## 2023-01-26 ENCOUNTER — Other Ambulatory Visit: Payer: Self-pay | Admitting: Student in an Organized Health Care Education/Training Program

## 2023-02-26 ENCOUNTER — Encounter: Payer: Self-pay | Admitting: Student in an Organized Health Care Education/Training Program

## 2023-02-26 ENCOUNTER — Ambulatory Visit
Payer: Medicare Other | Attending: Student in an Organized Health Care Education/Training Program | Admitting: Student in an Organized Health Care Education/Training Program

## 2023-02-26 VITALS — BP 159/97 | HR 82 | Temp 97.0°F | Resp 16 | Ht 72.0 in | Wt 225.0 lb

## 2023-02-26 DIAGNOSIS — Z79891 Long term (current) use of opiate analgesic: Secondary | ICD-10-CM | POA: Diagnosis present

## 2023-02-26 DIAGNOSIS — G894 Chronic pain syndrome: Secondary | ICD-10-CM | POA: Insufficient documentation

## 2023-02-26 DIAGNOSIS — M7918 Myalgia, other site: Secondary | ICD-10-CM | POA: Insufficient documentation

## 2023-02-26 DIAGNOSIS — M19011 Primary osteoarthritis, right shoulder: Secondary | ICD-10-CM | POA: Insufficient documentation

## 2023-02-26 DIAGNOSIS — M47816 Spondylosis without myelopathy or radiculopathy, lumbar region: Secondary | ICD-10-CM | POA: Diagnosis present

## 2023-02-26 MED ORDER — OXYCODONE-ACETAMINOPHEN 5-325 MG PO TABS
1.0000 | ORAL_TABLET | Freq: Four times a day (QID) | ORAL | 0 refills | Status: DC | PRN
Start: 2023-04-27 — End: 2023-05-23

## 2023-02-26 MED ORDER — OXYCODONE-ACETAMINOPHEN 5-325 MG PO TABS
1.0000 | ORAL_TABLET | Freq: Four times a day (QID) | ORAL | 0 refills | Status: AC | PRN
Start: 2023-02-26 — End: 2023-03-28

## 2023-02-26 MED ORDER — OXYCODONE-ACETAMINOPHEN 5-325 MG PO TABS
1.0000 | ORAL_TABLET | Freq: Four times a day (QID) | ORAL | 0 refills | Status: AC | PRN
Start: 2023-03-28 — End: 2023-04-27

## 2023-02-26 NOTE — Progress Notes (Signed)
 Safety precautions to be maintained throughout the outpatient stay will include: orient to surroundings, keep bed in low position, maintain call bell within reach at all times, provide assistance with transfer out of bed and ambulation. Nursing Pain Medication Assessment:  Safety precautions to be maintained throughout the outpatient stay will include: orient to surroundings, keep bed in low position, maintain call bell within reach at all times, provide assistance with transfer out of bed and ambulation.  Medication Inspection Compliance: Pill count conducted under aseptic conditions, in front of the patient. Neither the pills nor the bottle was removed from the patient's sight at any time. Once count was completed pills were immediately returned to the patient in their original bottle.  Medication: Oxycodone /APAP Pill/Patch Count:  0 of 90 pills remain Pill/Patch Appearance: Markings consistent with prescribed medication Bottle Appearance: Standard pharmacy container. Clearly labeled. Filled Date: 42 / 07 / 2024 Last Medication intake:  Yesterday

## 2023-02-26 NOTE — Progress Notes (Signed)
 PROVIDER NOTE: Information contained herein reflects review and annotations entered in association with encounter. Interpretation of such information and data should be left to medically-trained personnel. Information provided to patient can be located elsewhere in the medical record under Patient Instructions. Document created using STT-dictation technology, any transcriptional errors that may result from process are unintentional.    Patient: Charles Mooney  Service Category: E/M  Provider: Wallie Sherry, MD  DOB: 27-Sep-1955  DOS: 02/26/2023  Specialty: Interventional Pain Management  MRN: [id number]  Setting: Ambulatory outpatient  PCP: Sherial Bail, MD  Type: Established Patient    Referring Provider: Sherial Bail, MD  Location: Office  Delivery: Face-to-face     HPI  [name], a 68 y.o. year old male, is here today because of his Chronic pain syndrome [G89.4]. [name]'s primary complain today is Back Pain (Posterior below right shoulder, since Thanksgiving 2024, worse in the past 2 weeks;/Also lower left back which radiates down left leg to knee)  Last encounter: My last encounter with him was on 11/27/22  Pertinent problems: [name] has Chronic bilateral low back pain with bilateral sciatica; Acute bursitis of right shoulder; Nonallopathic lesion of sacral region; Lumbar facet arthropathy; Lumbar degenerative disc disease; Localized osteoarthritis of shoulder regions, bilateral; Encounter for long-term opiate analgesic use; Chronic SI joint pain; and Chronic pain syndrome on their pertinent problem list. Pain Assessment: Severity of Chronic pain is reported as a 7 /10. Location: Back Posterior, Upper, Right/up into right shoulder, c/o numbness, tingling, vibrating sensation in right fingers. Onset: More than a month ago. Quality: Tingling, Numbness, Aching, Sharp. Timing: Constant. Modifying factor(s): meds helping somewhat; heat, ice, TENS, stretching. Vitals:   height is 6' (1.829 m) and weight is 225 lb (102.1 kg). His temperature is 97 F (36.1 C) (abnormal). His blood pressure is 159/97 (abnormal) and his pulse is 82. His respiration is 16 and oxygen saturation is 99%.   Reason for encounter: medication management.    Discussed the use of AI scribe software for clinical note transcription with the patient, who gave verbal consent to proceed.  History of Present Illness   The patient presents with a chronic issue of pain in the right shoulder, which has been radiating into the right thoracic region. The pain began approximately a couple of months ago as a knot in the muscle, noticeable particularly during morning computer use. The patient attempted to manage the discomfort by switching mouse hands, which initially seemed to alleviate the symptoms. However, the pain has since escalated to a constant, pinching sensation, likened to a 'Vulcan nerve pinch.'  Despite attempts at relief through chiropractic adjustments and massages, the pain has persisted and even increased. The patient has been managing the pain with medication, but recently the dosage has had to be increased to six pills a day on particularly painful days. The patient acknowledges that this is not a daily requirement, with some days being more manageable than others.       Pharmacotherapy Assessment  Analgesic:  Percocet 5 mg QID prn   Monitoring: [place] PMP: PDMP reviewed during this encounter.       Pharmacotherapy: No side-effects or adverse reactions reported. Compliance: No problems identified. Effectiveness: Clinically acceptable.  UDS:  Summary  Date Value Ref Range Status  06/05/2022 Note  Final    Comment:    ==================================================================== [employer] [facility] (MW) ==================================================================== Test  Result       Flag       Units  Drug Present and Declared for  Prescription Verification   Oxycodone                       717          EXPECTED   ng/mg creat   Oxymorphone                    700          EXPECTED   ng/mg creat   Noroxycodone                   2135         EXPECTED   ng/mg creat   Noroxymorphone                 348          EXPECTED   ng/mg creat    Sources of oxycodone  are scheduled prescription medications.    Oxymorphone, noroxycodone, and noroxymorphone are expected    metabolites of oxycodone . Oxymorphone is also available as a    scheduled prescription medication.  ==================================================================== Test                      Result    Flag   Units      Ref Range   Creatinine              23               mg/dL      >=79 ==================================================================== Declared Medications:  The flagging and interpretation on this report are based on the  following declared medications.  Unexpected results may arise from  inaccuracies in the declared medications.   **Note: The testing scope of this panel includes these medications:   Oxycodone  (Percocet)   **Note: The testing scope of this panel does not include the  following reported medications:   Acetaminophen  (Percocet)  Amlodipine (Norvasc)  Baclofen  (Lioresal )  Cannabidiol  Citalopram (Celexa)  Cyclobenzaprine  (Flexeril )  Meloxicam  (Mobic )  Trazodone (Desyrel) ==================================================================== For clinical consultation, please call [phone]. ====================================================================       ROS  Constitutional: Denies any fever or chills Gastrointestinal: No reported hemesis, hematochezia, vomiting, or acute GI distress Musculoskeletal:  Right shoulder and thoracic pain Neurological: No reported episodes of acute onset apraxia, aphasia, dysarthria, agnosia, amnesia, paralysis, loss of coordination, or loss of consciousness  Medication  Review  NON FORMULARY, amLODipine, baclofen , citalopram, cyclobenzaprine , meloxicam , and oxyCODONE -acetaminophen   History Review  Allergy: [name] is allergic to sulfa antibiotics. Drug: [name]  reports that he does not currently use drugs. Alcohol:  reports current alcohol use of about 18.0 standard drinks of alcohol per week. Tobacco:  reports that he quit smoking about 6 months ago. His smoking use included cigarettes. He has never used smokeless tobacco. Social: Mr. Hoffmeier  reports that he quit smoking about 6 months ago. His smoking use included cigarettes. He has never used smokeless tobacco. He reports current alcohol use of about 18.0 standard drinks of alcohol per week. He reports that he does not currently use drugs. Medical:  has a past medical history of Arthritis, Hepatitis, Hypertension, Subarachnoid hemorrhage (HCC) (2017), and Wears hearing aid in both ears. Surgical: Mr. Gorter  has a past surgical history that includes Brain surgery; Joint replacement (Bilateral); Shoulder arthroscopy with rotator cuff repair and open biceps  tenodesis (Left, 09/08/2018); and Hernia repair [phone]). Family: family history is not on file.  Laboratory Chemistry Profile   Renal Lab Results  Component Value Date   BUN 18 09/04/2018   CREATININE 0.81 09/04/2018   GFRAA >60 09/04/2018   GFRNONAA >60 09/04/2018     Hepatic Lab Results  Component Value Date   AST 18 09/04/2018   ALT 20 09/04/2018   ALBUMIN 4.4 09/04/2018   ALKPHOS 39 09/04/2018     Electrolytes Lab Results  Component Value Date   NA 138 09/04/2018   K 3.9 09/04/2018   CL 105 09/04/2018   CALCIUM 9.2 09/04/2018     Bone No results found for: VD25OH, VD125OH2TOT, CI6874NY7, CI7874NY7, 25OHVITD1, 25OHVITD2, 25OHVITD3, TESTOFREE, TESTOSTERONE   Inflammation (CRP: Acute Phase) (ESR: Chronic Phase) No results found for: CRP, ESRSEDRATE, LATICACIDVEN     Note: Above Lab results  reviewed.  Recent Imaging Review  DG PAIN CLINIC C-ARM 1-60 MIN NO REPORT Fluoro was used, but no Radiologist interpretation will be provided.  Please refer to NOTES tab for provider progress note.  Note: Reviewed        CLINICAL DATA:  Right shoulder pain and limited range of motion for 6 months. No known injury.   EXAM: MRI OF THE RIGHT SHOULDER WITHOUT CONTRAST   TECHNIQUE: Multiplanar, multisequence MR imaging of the shoulder was performed. No intravenous contrast was administered.   COMPARISON:  None.   FINDINGS: Rotator cuff: There is extensive heterogeneous increased T2 signal and thickening of the rotator cuff tendons consistent with severe tendinopathy. Tendinopathy is worst in supraspinatus where virtually no normal-appearing tendon is seen.   Muscles: Intermediate intensity edema is seen in the supraspinatus muscle belly and there is very mild atrophy of the supraspinatus.   Biceps long head: There is severe tendinopathy of both the intra and extra-articular segments, worse in the intra-articular segment.   Acromioclavicular Joint: Moderately severe degenerative change is present. Type 1 acromion. There is subacromial spurring. A large volume of fluid is seen in the subacromial/subdeltoid bursa   Glenohumeral Joint: Mild degenerative change is seen. Prominent fluid is noted in the subscapularis recess.   Labrum: The superior labrum is degenerated but no tear is identified.   Bones:  No fracture or focal lesion.   Other: None.   IMPRESSION: Severe rotator cuff tendinopathy is worst in the supraspinatus where virtually no normal-appearing tendon is identified but no focal tear is seen. There is mild atrophy of the supraspinatus muscle belly.   Tendinopathy of the intra and extra-articular long head of biceps is severe in the intra-articular segment.   Moderately severe acromioclavicular osteoarthritis. Subacromial spurring is noted.   Large volume  of subacromial/subdeltoid fluid consistent with bursitis.     Electronically Signed   By: Debby Prader M.D.   On: 11/21/2017 09:08    Physical Exam  General appearance: Well nourished, well developed, and well hydrated. In no apparent acute distress Mental status: Alert, oriented x 3 (person, place, & time)       Respiratory: No evidence of acute respiratory distress Eyes: PERLA Vitals: BP (!) 159/97   Pulse 82   Temp (!) 97 F (36.1 C)   Resp 16   Ht 6' (1.829 m)   Wt 225 lb (102.1 kg)   SpO2 99%   BMI 30.52 kg/m  BMI: Estimated body mass index is 30.52 kg/m as calculated from the following:   Height as of this encounter: 6' (1.829 m).   Weight as  of this encounter: 225 lb (102.1 kg). Ideal: Ideal body weight: 77.6 kg (171 lb 1.2 oz) Adjusted ideal body weight: 87.4 kg (192 lb 10.3 oz)  3 thoracic tp   Lumbar Spine Area Exam  Skin & Axial Inspection: No masses, redness, or swelling Alignment: Symmetrical Functional ROM: Unrestricted ROM       Stability: No instability detected Muscle Tone/Strength: Functionally intact. No obvious neuro-muscular anomalies detected. Sensory (Neurological): Musculoskeletal pain pattern pain with facet loading, right greater than left       Assessment   Diagnosis  1. Chronic pain syndrome   2. Myofascial pain syndrome of thoracic spine   3. Encounter for long-term opiate analgesic use   4. Lumbar facet arthropathy   5. Arthritis of right shoulder region        Plan of Care   Mr. TRIGG DELAROCHA has a current medication list which includes the following long-term medication(s): amlodipine and citalopram.   Pharmacotherapy (Medications Ordered): Meds ordered this encounter  Medications   oxyCODONE -acetaminophen  (PERCOCET/ROXICET) 5-325 MG tablet    Sig: Take 1 tablet by mouth every 6 (six) hours as needed for severe pain (pain score 7-10).    Dispense:  120 tablet    Refill:  0   oxyCODONE -acetaminophen   (PERCOCET/ROXICET) 5-325 MG tablet    Sig: Take 1 tablet by mouth every 6 (six) hours as needed for severe pain (pain score 7-10).    Dispense:  120 tablet    Refill:  0   oxyCODONE -acetaminophen  (PERCOCET/ROXICET) 5-325 MG tablet    Sig: Take 1 tablet by mouth every 6 (six) hours as needed for severe pain (pain score 7-10).    Dispense:  120 tablet    Refill:  0    Orders Placed This Encounter  Procedures   TRIGGER POINT INJECTION    Standing Status:   Future    Expiration Date:   05/27/2023    Scheduling Instructions:     Right mid thoracic    Where will this procedure be performed?:   [facility]    Follow-up plan:   Return in about 6 days (around 03/04/2023) for Right mid thoracic TPI .     Status post bilateral L3, L4, L5, S1 facet  medial branch nerve blocks #1 on 06/01/2019 50% pain relief for 3 days, consider repeating in future or consider sprint peripheral nerve stimulation of medial branch at L4 , s/p right glenohumeral joint injection 09/14/2020.      Recent Visits No visits were found meeting these conditions. Showing recent visits within past 90 days and meeting all other requirements Today's Visits Date Type Provider Dept  02/26/23 Office Visit Marcelino Nurse, MD [facility]  Showing today's visits and meeting all other requirements Future Appointments Date Type Provider Dept  03/04/23 Appointment Marcelino Nurse, MD [facility]  05/23/23 Appointment Marcelino Nurse, MD [facility]  Showing future appointments within next 90 days and meeting all other requirements  I discussed the assessment and treatment plan with the patient. The patient was provided an opportunity to ask questions and all were answered. The patient agreed with the plan and demonstrated an understanding of the instructions.  Patient advised to call back or seek an in-person evaluation if the symptoms or condition worsens.  Duration of encounter: 30 minutes.  Note  by: Nurse Marcelino, MD Date: 02/26/2023; Time: 8:44 AM

## 2023-02-26 NOTE — Patient Instructions (Signed)
 GENERAL RISKS AND COMPLICATIONS  What are the risk, side effects and possible complications? Generally speaking, most procedures are safe.  However, with any procedure there are risks, side effects, and the possibility of complications.  The risks and complications are dependent upon the sites that are lesioned, or the type of nerve block to be performed.  The closer the procedure is to the spine, the more serious the risks are.  Great care is taken when placing the radio frequency needles, block needles or lesioning probes, but sometimes complications can occur. Infection: Any time there is an injection through the skin, there is a risk of infection.  This is why sterile conditions are used for these blocks.  There are four possible types of infection. Localized skin infection. Central Nervous System Infection-This can be in the form of Meningitis, which can be deadly. Epidural Infections-This can be in the form of an epidural abscess, which can cause pressure inside of the spine, causing compression of the spinal cord with subsequent paralysis. This would require an emergency surgery to decompress, and there are no guarantees that the patient would recover from the paralysis. Discitis-This is an infection of the intervertebral discs.  It occurs in about 1% of discography procedures.  It is difficult to treat and it may lead to surgery.        2. Pain: the needles have to go through skin and soft tissues, will cause soreness.       3. Damage to internal structures:  The nerves to be lesioned may be near blood vessels or    other nerves which can be potentially damaged.       4. Bleeding: Bleeding is more common if the patient is taking blood thinners such as  aspirin, Coumadin, Ticiid, Plavix, etc., or if he/she have some genetic predisposition  such as hemophilia. Bleeding into the spinal canal can cause compression of the spinal  cord with subsequent paralysis.  This would require an emergency  surgery to  decompress and there are no guarantees that the patient would recover from the  paralysis.       5. Pneumothorax:  Puncturing of a lung is a possibility, every time a needle is introduced in  the area of the chest or upper back.  Pneumothorax refers to free air around the  collapsed lung(s), inside of the thoracic cavity (chest cavity).  Another two possible  complications related to a similar event would include: Hemothorax and Chylothorax.   These are variations of the Pneumothorax, where instead of air around the collapsed  lung(s), you may have blood or chyle, respectively.       6. Spinal headaches: They may occur with any procedures in the area of the spine.       7. Persistent CSF (Cerebro-Spinal Fluid) leakage: This is a rare problem, but may occur  with prolonged intrathecal or epidural catheters either due to the formation of a fistulous  track or a dural tear.       8. Nerve damage: By working so close to the spinal cord, there is always a possibility of  nerve damage, which could be as serious as a permanent spinal cord injury with  paralysis.       9. Death:  Although rare, severe deadly allergic reactions known as "Anaphylactic  reaction" can occur to any of the medications used.      10. Worsening of the symptoms:  We can always make thing worse.  What are the chances  of something like this happening? Chances of any of this occuring are extremely low.  By statistics, you have more of a chance of getting killed in a motor vehicle accident: while driving to the hospital than any of the above occurring .  Nevertheless, you should be aware that they are possibilities.  In general, it is similar to taking a shower.  Everybody knows that you can slip, hit your head and get killed.  Does that mean that you should not shower again?  Nevertheless always keep in mind that statistics do not mean anything if you happen to be on the wrong side of them.  Even if a procedure has a 1 (one) in a  1,000,000 (million) chance of going wrong, it you happen to be that one..Also, keep in mind that by statistics, you have more of a chance of having something go wrong when taking medications.  Who should not have this procedure? If you are on a blood thinning medication (e.g. Coumadin, Plavix, see list of "Blood Thinners"), or if you have an active infection going on, you should not have the procedure.  If you are taking any blood thinners, please inform your physician.  How should I prepare for this procedure? Do not eat or drink anything at least six hours prior to the procedure. Bring a driver with you .  It cannot be a taxi. Come accompanied by an adult that can drive you back, and that is strong enough to help you if your legs get weak or numb from the local anesthetic. Take all of your medicines the morning of the procedure with just enough water to swallow them. If you have diabetes, make sure that you are scheduled to have your procedure done first thing in the morning, whenever possible. If you have diabetes, take only half of your insulin dose and notify our nurse that you have done so as soon as you arrive at the clinic. If you are diabetic, but only take blood sugar pills (oral hypoglycemic), then do not take them on the morning of your procedure.  You may take them after you have had the procedure. Do not take aspirin or any aspirin-containing medications, at least eleven (11) days prior to the procedure.  They may prolong bleeding. Wear loose fitting clothing that may be easy to take off and that you would not mind if it got stained with Betadine or blood. Do not wear any jewelry or perfume Remove any nail coloring.  It will interfere with some of our monitoring equipment.  NOTE: Remember that this is not meant to be interpreted as a complete list of all possible complications.  Unforeseen problems may occur.  BLOOD THINNERS The following drugs contain aspirin or other products,  which can cause increased bleeding during surgery and should not be taken for 2 weeks prior to and 1 week after surgery.  If you should need take something for relief of minor pain, you may take acetaminophen  which is found in Tylenol ,m Datril, Anacin-3 and Panadol. It is not blood thinner. The products listed below are.  Do not take any of the products listed below in addition to any listed on your instruction sheet.  A.P.C or A.P.C with Codeine Codeine Phosphate Capsules #3 Ibuprofen Ridaura  ABC compound Congesprin Imuran rimadil  Advil Cope Indocin Robaxisal  Alka-Seltzer Effervescent Pain Reliever and Antacid Coricidin or Coricidin-D  Indomethacin Rufen  Alka-Seltzer plus Cold Medicine Cosprin Ketoprofen S-A-C Tablets  Anacin Analgesic Tablets or Capsules Coumadin  Korlgesic Salflex  Anacin Extra Strength Analgesic tablets or capsules CP-2 Tablets Lanoril Salicylate  Anaprox Cuprimine Capsules Levenox Salocol  Anexsia-D Dalteparin Magan Salsalate  Anodynos Darvon compound Magnesium Salicylate Sine-off  Ansaid Dasin Capsules Magsal Sodium Salicylate  Anturane Depen Capsules Marnal Soma  APF Arthritis pain formula Dewitt's Pills Measurin Stanback  Argesic Dia-Gesic Meclofenamic Sulfinpyrazone  Arthritis Bayer Timed Release Aspirin Diclofenac Meclomen Sulindac  Arthritis pain formula Anacin Dicumarol Medipren Supac  Analgesic (Safety coated) Arthralgen Diffunasal Mefanamic Suprofen  Arthritis Strength Bufferin Dihydrocodeine Mepro Compound Suprol  Arthropan liquid Dopirydamole Methcarbomol with Aspirin Synalgos  ASA tablets/Enseals Disalcid Micrainin Tagament  Ascriptin Doan's Midol Talwin  Ascriptin A/D Dolene Mobidin Tanderil  Ascriptin Extra Strength Dolobid Moblgesic Ticlid  Ascriptin with Codeine Doloprin or Doloprin with Codeine Momentum Tolectin  Asperbuf Duoprin Mono-gesic Trendar  Aspergum Duradyne Motrin or Motrin IB Triminicin  Aspirin plain, buffered or enteric coated  Durasal Myochrisine Trigesic  Aspirin Suppositories Easprin Nalfon Trillsate  Aspirin with Codeine Ecotrin Regular or Extra Strength Naprosyn Uracel  Atromid-S Efficin Naproxen Ursinus  Auranofin Capsules Elmiron Neocylate Vanquish  Axotal Emagrin Norgesic Verin  Azathioprine Empirin or Empirin with Codeine Normiflo Vitamin E  Azolid Emprazil Nuprin Voltaren  Bayer Aspirin plain, buffered or children's or timed BC Tablets or powders Encaprin Orgaran Warfarin Sodium  Buff-a-Comp Enoxaparin Orudis Zorpin  Buff-a-Comp with Codeine Equegesic Os-Cal-Gesic   Buffaprin Excedrin plain, buffered or Extra Strength Oxalid   Bufferin Arthritis Strength Feldene Oxphenbutazone   Bufferin plain or Extra Strength Feldene Capsules Oxycodone with Aspirin   Bufferin with Codeine Fenoprofen Fenoprofen Pabalate or Pabalate-SF   Buffets II Flogesic Panagesic   Buffinol plain or Extra Strength Florinal or Florinal with Codeine Panwarfarin   Buf-Tabs Flurbiprofen Penicillamine   Butalbital Compound Four-way cold tablets Penicillin   Butazolidin Fragmin Pepto-Bismol   Carbenicillin Geminisyn Percodan   Carna Arthritis Reliever Geopen Persantine   Carprofen Gold's salt Persistin   Chloramphenicol Goody's Phenylbutazone   Chloromycetin Haltrain Piroxlcam   Clmetidine heparin Plaquenil   Cllnoril Hyco-pap Ponstel   Clofibrate Hydroxy chloroquine Propoxyphen         Before stopping any of these medications, be sure to consult the physician who ordered them.  Some, such as Coumadin (Warfarin) are ordered to prevent or treat serious conditions such as "deep thrombosis", "pumonary embolisms", and other heart problems.  The amount of time that you may need off of the medication may also vary with the medication and the reason for which you were taking it.  If you are taking any of these medications, please make sure you notify your pain physician before you undergo any procedures.         Trigger Point  Injection Trigger points are areas where you have pain. A trigger point injection is a shot given in the trigger point to help relieve pain for a few days to a few months. Common places for trigger points include the neck, shoulders, upper back, or lower back. A trigger point injection will not cure long-term (chronic) pain permanently. These injections do not always work for every person. For some people, they can help to relieve pain for a few days to a few months. Tell a health care provider about: Any allergies you have. All medicines you are taking, including vitamins, herbs, eye drops, creams, and over-the-counter medicines. Any problems you or family members have had with anesthetic medicines. Any bleeding problems you have. Any surgeries you have had. Any medical conditions you have. Whether you are  pregnant or may be pregnant. What are the risks? Generally, this is a safe procedure. However, problems may occur, including: Infection. Bleeding or bruising. Allergic reaction to the injected medicine. Irritation of the skin around the injection site. What happens before the procedure? Ask your health care provider about: Changing or stopping your regular medicines. This is especially important if you are taking diabetes medicines or blood thinners. Taking medicines such as aspirin and ibuprofen. These medicines can thin your blood. Do not take these medicines unless your health care provider tells you to take them. Taking over-the-counter medicines, vitamins, herbs, and supplements. What happens during the procedure?  Your health care provider will feel for trigger points. A marker may be used to circle the area for the injection. The skin over the trigger point will be washed with a germ-killing soap. You may be given a medicine to help you relax (sedative). A thin needle is used for the injection. You may feel pain or a twitching feeling when the needle enters your skin. A numbing  solution may be injected into the trigger point. Sometimes a medicine to keep down inflammation is also injected. Your health care provider may move the needle around the area where the trigger point is located until the tightness and twitching goes away. After the injection, your health care provider may put gentle pressure over the injection site. The injection site will be covered with a bandage (dressing). The procedure may vary among health care providers and hospitals. What can I expect after treatment? After treatment, you may have soreness and stiffness for 1-2 days. Follow these instructions at home: Injection site care Remove your dressing in a few hours, or as told by your health care provider. Check your injection site every day for signs of infection. Check for: Redness, swelling, or pain. Fluid or blood. Warmth. Pus or a bad smell. Managing pain, stiffness, and swelling If directed, put ice on the affected area. To do this: Put ice in a plastic bag. Place a towel between your skin and the bag. Leave the ice on for 20 minutes, 2-3 times a day. Remove the ice if your skin turns bright red. This is very important. If you cannot feel pain, heat, or cold, you have a greater risk of damage to the area. Activity If you were given a sedative during the procedure, it can affect you for several hours. Do not drive or operate machinery until your health care provider says that it is safe. Do not take baths, swim, or use a hot tub until your health care provider approves. Return to your normal activities as told by your health care provider. Ask your health care provider what activities are safe for you. General instructions If you were asked to stop your regular medicines, ask your health care provider when you may start taking them again. You may be asked to see an occupational or physical therapist for exercises that reduce muscle strain and stretch the area of the trigger  point. Keep all follow-up visits. This is important. Contact a health care provider if: Your pain comes back, and it is worse than before the injection. You may need more injections. You have chills or a fever. The injection site becomes more painful, red, swollen, or warm to the touch. Summary A trigger point injection is a shot given in the trigger point to help relieve pain. Common places for trigger point injections are the neck, shoulders, upper back, and lower back. These injections do not  always work for every person, but for some people, the injections can help to relieve pain for a few days to a few months. Contact a health care provider if symptoms come back or if they are worse than before treatment. Also, get help if the injection site becomes more painful, red, swollen, or warm to the touch. This information is not intended to replace advice given to you by your health care provider. Make sure you discuss any questions you have with your health care provider. Document Revised: 05/17/2020 Document Reviewed: 05/17/2020 [employer]vier Patient Education  2024 [employer].

## 2023-03-04 ENCOUNTER — Encounter: Payer: Self-pay | Admitting: Student in an Organized Health Care Education/Training Program

## 2023-03-04 ENCOUNTER — Ambulatory Visit
Payer: Medicare Other | Attending: Student in an Organized Health Care Education/Training Program | Admitting: Student in an Organized Health Care Education/Training Program

## 2023-03-04 VITALS — BP 162/96 | HR 72 | Temp 97.4°F | Ht 72.0 in | Wt 225.0 lb

## 2023-03-04 DIAGNOSIS — M7918 Myalgia, other site: Secondary | ICD-10-CM | POA: Insufficient documentation

## 2023-03-04 MED ORDER — ROPIVACAINE HCL 2 MG/ML IJ SOLN
INTRAMUSCULAR | Status: AC
  Filled 2023-03-04: qty 20

## 2023-03-04 MED ORDER — DEXAMETHASONE SODIUM PHOSPHATE 10 MG/ML IJ SOLN
10.0000 mg | Freq: Once | INTRAMUSCULAR | Status: AC
  Administered 2023-03-04: 10 mg

## 2023-03-04 MED ORDER — DEXAMETHASONE SODIUM PHOSPHATE 10 MG/ML IJ SOLN
INTRAMUSCULAR | Status: AC
  Filled 2023-03-04: qty 1

## 2023-03-04 MED ORDER — ROPIVACAINE HCL 2 MG/ML IJ SOLN
9.0000 mL | Freq: Once | INTRAMUSCULAR | Status: AC
  Administered 2023-03-04: 9 mL via PERINEURAL

## 2023-03-04 NOTE — Patient Instructions (Signed)

## 2023-03-04 NOTE — Progress Notes (Signed)
 Safety precautions to be maintained throughout the outpatient stay will include: orient to surroundings, keep bed in low position, maintain call bell within reach at all times, provide assistance with transfer out of bed and ambulation.

## 2023-03-04 NOTE — Progress Notes (Signed)
 PROVIDER NOTE: Interpretation of information contained herein should be left to medically-trained personnel. Specific patient instructions are provided elsewhere under Patient Instructions section of medical record. This document was created in part using STT-dictation technology, any transcriptional errors that may result from this process are unintentional.  Patient: Charles Mooney Type: Established DOB: September 19, 1955 MRN: [id number] PCP: Sherial Bail, MD  Service: Procedure DOS: 03/04/2023 Setting: Ambulatory Location: Ambulatory outpatient facility Delivery: Face-to-face Provider: Wallie Sherry, MD Specialty: Interventional Pain Management Specialty designation: 09 Location: Outpatient facility Ref. Prov.: Sherial Bail, MD       Interventional Therapy   Type:  Right thoracic Trigger Point Injection (Myoneural Block) (1-2 muscle groups)  #1 (w/ steroids)  CPT: 20552 Laterality: Right (-RT)   Imaging: N/A. Landmark-guided,           Anesthesia: Local anesthesia (1-2% Lidocaine ) DOS: 03/04/2023  Performed by: Wallie Sherry, MD  Medical Necessity (reasoning)  Purpose: Diagnostic/Therapeutic Rationale (medical necessity): procedure needed and proper for the diagnosis and/or treatment of Charles Mooney's medical symptoms and needs. 1. Myofascial pain syndrome of thoracic spine    NAS-11 Pain score:   Pre-procedure: 7 /10   Post-procedure: 7 /10       Muscle: Thoracic rhomboids, lat dorsi,  paraspinals Target: Myoneural mass of trigger point.   Approach: Percutaneous  Type of procedure: Myoneural injection   Position  Prep  Materials  Position: Sitting. Patient assisted into a comfortable position. Pressure points checked.  Prep solution: ChloraPrep (2% chlorhexidine  gluconate and 70% isopropyl alcohol) The target area was identified and the area prepped in the usual manner.  Materials:   Tray: Block Needle(s):  Type: Regular  Gauge (G): 22  Length: 1.5-in   Qty: 1  H&P (Pre-op Assessment):  Charles Mooney is a 68 y.o. (year old), male patient, seen today for interventional treatment. He  has a past surgical history that includes Brain surgery; Joint replacement (Bilateral); Shoulder arthroscopy with rotator cuff repair and open biceps tenodesis (Left, 09/08/2018); and Hernia repair (1963). Charles Mooney has a current medication list which includes the following prescription(s): amlodipine, baclofen , citalopram, cyclobenzaprine , meloxicam , NON FORMULARY, oxycodone -acetaminophen , [START ON 03/28/2023] oxycodone -acetaminophen , [START ON 04/27/2023] oxycodone -acetaminophen , and trazodone. His primarily concern today is the Back Pain  Initial Vital Signs:  Pulse/HCG Rate: 72  Temp: (!) 97.4 F (36.3 C) Resp:   BP: (!) 162/96 (Dr latee notified; pt state he did not take BP med this am) SpO2: 99 %  BMI: Estimated body mass index is 30.52 kg/m as calculated from the following:   Height as of this encounter: 6' (1.829 m).   Weight as of this encounter: 225 lb (102.1 kg).  Risk Assessment: Allergies: Reviewed. He is allergic to sulfa antibiotics.  Allergy Precautions: None required Coagulopathies: Reviewed. None identified.  Blood-thinner therapy: None at this time Active Infection(s): Reviewed. None identified. Charles Mooney is afebrile  Site Confirmation: Charles Mooney was asked to confirm the procedure and laterality before marking the site Procedure checklist: Completed Consent: Before the procedure and under the influence of no sedative(s), amnesic(s), or anxiolytics, the patient was informed of the treatment options, risks and possible complications. To fulfill our ethical and legal obligations, as recommended by the American Medical Association's Code of Ethics, I have informed the patient of my clinical impression; the nature and purpose of the treatment or procedure; the risks, benefits, and possible complications of the intervention; the alternatives,  including doing nothing; the risk(s) and benefit(s) of the alternative treatment(s) or procedure(s); and the risk(s)  and benefit(s) of doing nothing. The patient was provided information about the general risks and possible complications associated with the procedure. These may include, but are not limited to: failure to achieve desired goals, infection, bleeding, organ or nerve damage, allergic reactions, paralysis, and death. In addition, the patient was informed of those risks and complications associated to the procedure, such as failure to decrease pain; infection; bleeding; organ or nerve damage with subsequent damage to sensory, motor, and/or autonomic systems, resulting in permanent pain, numbness, and/or weakness of one or several areas of the body; allergic reactions; (i.e.: anaphylactic reaction); and/or death. Furthermore, the patient was informed of those risks and complications associated with the medications. These include, but are not limited to: allergic reactions (i.e.: anaphylactic or anaphylactoid reaction(s)); adrenal axis suppression; blood sugar elevation that in diabetics may result in ketoacidosis or comma; water retention that in patients with history of congestive heart failure may result in shortness of breath, pulmonary edema, and decompensation with resultant heart failure; weight gain; swelling or edema; medication-induced neural toxicity; particulate matter embolism and blood vessel occlusion with resultant organ, and/or nervous system infarction; and/or aseptic necrosis of one or more joints. Finally, the patient was informed that Medicine is not an exact science; therefore, there is also the possibility of unforeseen or unpredictable risks and/or possible complications that may result in a catastrophic outcome. The patient indicated having understood very clearly. We have given the patient no guarantees and we have made no promises. Enough time was given to the patient to ask  questions, all of which were answered to the patient's satisfaction. Charles Mooney has indicated that he wanted to continue with the procedure. Attestation: I, the ordering provider, attest that I have discussed with the patient the benefits, risks, side-effects, alternatives, likelihood of achieving goals, and potential problems during recovery for the procedure that I have provided informed consent. Date  Time: 03/04/2023 10:33 AM   Pre-Procedure Preparation:  Monitoring: As per clinic protocol. Respiration, ETCO2, SpO2, BP, heart rate and rhythm monitor placed and checked for adequate function Safety Precautions: Patient was assessed for positional comfort and pressure points before starting the procedure. Time-out: I initiated and conducted the Time-out before starting the procedure, as per protocol. The patient was asked to participate by confirming the accuracy of the Time Out information. Verification of the correct person, site, and procedure were performed and confirmed by me, the nursing staff, and the patient. Time-out conducted as per Joint Commission's Universal Protocol ([email or web address].01.01.01). Time: 1055 Start Time: 1055 hrs.   Narrative                Start Time: 1055 hrs.  Standard Safety Precautions: Protocol guidelines were followed. Aspiration was conducted prior to injection. At no point did I inject any substances, as a needle was being advanced. No attempts were made at seeking a paresthesia. Safe injection practices and needle disposal techniques used. Medications properly checked for expiration dates. SDV (single dose vial) medications used.  Local Anesthesia: Skin & deeper tissues infiltrated with local anesthetic. Appropriate amount of time allowed for local anesthetics to take effect.   Technical description:  The target area was identified and the area prepped in the usual manner. The procedure needles were then advanced to the target area. Proper needle placement secured.  Negative aspiration confirmed. Solution injected in intermittent fashion, asking for systemic symptoms every 0.5cc of injectate. The needles were then removed and the area cleansed, making sure to leave some of the prepping solution back  to take advantage of its long term bactericidal properties.  Approximately 10 trigger points injected with 0.5 to 1 cc of a solution containing 9 cc of 0.2% ropivacaine ,  1 cc of Decadron  10 mg/cc.  Vitals:   03/04/23 1036  BP: (!) 162/96  Pulse: 72  Temp: (!) 97.4 F (36.3 C)  SpO2: 99%  Weight: 225 lb (102.1 kg)  Height: 6' (1.829 m)     End Time: 1056 hrs.  Imaging Guidance                Type of Imaging Technique: None used Indication(s): N/A Exposure Time: No patient exposure Contrast: None used. Fluoroscopic Guidance: N/A Ultrasound Guidance: N/A Interpretation: N/A   Post-operative Assessment:  Post-procedure Vital Signs:  Pulse/HCG Rate: 72  Temp: (!) 97.4 F (36.3 C) Resp:   BP: (!) 162/96 (Dr latee notified; pt state he did not take BP med this am) SpO2: 99 %  EBL: None  Complications: No immediate post-treatment complications observed by team, or reported by patient.  Note: The patient tolerated the entire procedure well. A repeat set of vitals were taken after the procedure and the patient was kept under observation following institutional policy, for this type of procedure. Post-procedural neurological assessment was performed, showing return to baseline, prior to discharge. The patient was provided with post-procedure discharge instructions, including a section on how to identify potential problems. Should any problems arise concerning this procedure, the patient was given instructions to immediately contact us , at any time, without hesitation. In any case, we plan to contact the patient by telephone for a follow-up status report regarding this interventional procedure.  Comments:  No additional relevant information.   Plan  of Care (POC)  Orders:  No orders of the defined types were placed in this encounter.  Chronic Opioid Analgesic:   Percocet 5 mg QID prn   Medications ordered for procedure: Meds ordered this encounter  Medications   dexamethasone  (DECADRON ) injection 10 mg   ropivacaine  (PF) 2 mg/mL (0.2%) (NAROPIN ) injection 9 mL   Medications administered: We administered dexamethasone  and ropivacaine  (PF) 2 mg/mL (0.2%).  See the medical record for exact dosing, route, and time of administration.  Follow-up plan:   Return for Keep sch. appt.       Status post bilateral L3, L4, L5, S1 facet  medial branch nerve blocks #1 on 06/01/2019 50% pain relief for 3 days, consider repeating in future or consider sprint peripheral nerve stimulation of medial branch at L4 , s/p right glenohumeral joint injection 09/14/2020.        Recent Visits Date Type Provider Dept  02/26/23 Office Visit Marcelino Nurse, MD [facility]  Showing recent visits within past 90 days and meeting all other requirements Today's Visits Date Type Provider Dept  03/04/23 Procedure visit Marcelino Nurse, MD [facility]  Showing today's visits and meeting all other requirements Future Appointments Date Type Provider Dept  05/23/23 Appointment Marcelino Nurse, MD [facility]  Showing future appointments within next 90 days and meeting all other requirements  Disposition: Discharge home  Discharge (Date  Time): 03/04/2023; 1059 hrs.   Primary Care Physician: Sherial Bail, MD Location: [facility] Note by: Nurse Marcelino, MD (TTS technology used. I apologize for any typographical errors that were not detected and corrected.) Date: 03/04/2023; Time: 11:13 AM  Disclaimer:  Medicine is not an visual merchandiser. The only guarantee in medicine is that nothing is guaranteed. It is important to note  that the decision to proceed with this intervention was based on the information  collected from the patient. The Data and conclusions were drawn from the patient's questionnaire, the interview, and the physical examination. Because the information was provided in large part by the patient, it cannot be guaranteed that it has not been purposely or unconsciously manipulated. Every effort has been made to obtain as much relevant data as possible for this evaluation. It is important to note that the conclusions that lead to this procedure are derived in large part from the available data. Always take into account that the treatment will also be dependent on availability of resources and existing treatment guidelines, considered by other Pain Management Practitioners as being common knowledge and practice, at the time of the intervention. For Medico-Legal purposes, it is also important to point out that variation in procedural techniques and pharmacological choices are the acceptable norm. The indications, contraindications, technique, and results of the above procedure should only be interpreted and judged by a Board-Certified Interventional Pain Specialist with extensive familiarity and expertise in the same exact procedure and technique.

## 2023-03-04 NOTE — Addendum Note (Signed)
 Addended by: Edward Jolly on: 03/04/2023 01:46 PM   Modules accepted: Orders

## 2023-03-05 ENCOUNTER — Telehealth: Payer: Self-pay | Admitting: *Deleted

## 2023-03-05 NOTE — Telephone Encounter (Signed)
 No problems post procedure.

## 2023-03-06 ENCOUNTER — Ambulatory Visit
Payer: Medicare Other | Attending: Student in an Organized Health Care Education/Training Program | Admitting: Physical Therapy

## 2023-03-06 ENCOUNTER — Encounter: Payer: Self-pay | Admitting: Physical Therapy

## 2023-03-06 DIAGNOSIS — M546 Pain in thoracic spine: Secondary | ICD-10-CM | POA: Insufficient documentation

## 2023-03-06 DIAGNOSIS — M6281 Muscle weakness (generalized): Secondary | ICD-10-CM | POA: Insufficient documentation

## 2023-03-06 DIAGNOSIS — M7918 Myalgia, other site: Secondary | ICD-10-CM | POA: Insufficient documentation

## 2023-03-06 NOTE — Therapy (Signed)
OUTPATIENT PHYSICAL THERAPY EVALUATION   Patient Name: Charles Mooney MRN: [id number] DOB:1955-12-10, 68 y.o., male Today's Date: 03/06/2023  END OF SESSION:  PT End of Session - 03/06/23 1603     Visit Number 1    Number of Visits 17    Date for PT Re-Evaluation 05/29/23    Authorization Type MEDICARE PART B reporting period from 03/06/23    Progress Note Due on Visit 10    PT Start Time 1035    PT Stop Time 1115    PT Time Calculation (min) 40 min    Activity Tolerance Patient tolerated treatment well    Behavior During Therapy [facility] for tasks assessed/performed             Past Medical History:  Diagnosis Date   Arthritis    Hepatitis    c 2000   Hypertension    Subarachnoid hemorrhage (HCC) 2017   Wears hearing aid in both ears    Past Surgical History:  Procedure Laterality Date   BRAIN SURGERY     [facility] COILING 2017   HERNIA REPAIR  1963   double hernia repair   JOINT REPLACEMENT Bilateral    TKR  L-2012, R-2015   SHOULDER ARTHROSCOPY WITH ROTATOR CUFF REPAIR AND OPEN BICEPS TENODESIS Left 09/08/2018   Procedure: LEFT SHOULDER ARTHROSCOPY,SUBSACAPULARIS REPAIR, SUBACROMIAL DECOMP, DISTAL CLAVICLE EXCISION,BICEP TENODESIS, MINI OPEN REGENTEN PATCH APPLICATION;  Surgeon: Signa Kell, MD;  Location: [facility] ORS;  Service: Orthopedics;  Laterality: Left;   Patient Active Problem List   Diagnosis Date Noted   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   Closed fracture of rib of left side with routine healing 09/04/2022   Chronic pain of left heel 08/31/2021   Arthropathy of left wrist 10/12/2020   Viral hepatitis C 11/02/2019   Lumbar facet arthropathy 04/27/2019   Lumbar degenerative disc disease 04/27/2019   Localized osteoarthritis of shoulder regions, bilateral 04/27/2019   Encounter for long-term opiate analgesic use 04/27/2019   Chronic SI joint pain 04/27/2019   Chronic pain syndrome 04/27/2019   Pain in joint of left shoulder 06/03/2018   Abnormal gait 06/18/2017    Anatomical narrow angle 06/18/2017   Derangement of posterior horn of medial meniscus 06/18/2017   Edema 06/18/2017   Hip pain 06/18/2017   History of total knee arthroplasty 06/18/2017   Localized, primary osteoarthritis 06/18/2017   Muscle weakness 06/18/2017   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   Acute bursitis of right shoulder 06/14/2017   Nonallopathic lesion of sacral region 06/14/2017   Nonallopathic lesion of thoracic region 06/14/2017   Nonallopathic lesion of lumbosacral region 06/14/2017   Biomechanical lesion 06/14/2017   Chronic bilateral low back pain with bilateral sciatica 05/23/2017   Arthritis 04/19/2013   Cataract nuclear 06/16/2012   Glaucoma suspect of both eyes 06/16/2012    PCP: Enid Baas, MD  REFERRING PROVIDER: Edward Jolly, MD  REFERRING DIAG: myofascial pain syndrome of thoracic spine  Rationale for Evaluation and Treatment: Rehabilitation  THERAPY DIAG:  Pain in thoracic spine  Muscle weakness (generalized)  ONSET DATE: October 2024 (approximately 3 months after rib fracture in July 2024)  SUBJECTIVE:  SUBJECTIVE STATEMENT: Patient reports his thoracic spine pain started 15 years ago when he had his first acute episode. The most recent episode began in October 2024, approximately 3 months following a fall and subsequent rib fracture on his left side. He stopped activity after the rib fracture, the pain and muscle knots in his thoracic spine returned, and he has not been able to return to strengthening activities due to the pain. He also reports experiencing right shoulder pain since his rotator cuff repair surgery in November 2022. His shoulder pain is constant and worsens when his thoracic pain increases with occasional numbness to his right hand.  Since the most recent onset, he has been applying pressure to tender spots in his periscapular region, performing gentle stretching, and receiving chiropractic care for symptom management. He received a trigger point injection with steroids to the right thoracic rhomboids, lat dorsi, and paraspinals on 03/04/2023. He states his condition has been getting worse since onset with his pain worsening throughout the day regardless of activity level. He reports having difficulty finding a comfortable sleeping position due to pain in multiple regions and that his pain wakes him from a deep sleep. He would like to get a new routine for stretching and exercises for his back and shoulder to see if he can settle his pain down.    PERTINENT HISTORY:  Patient is a 68 y.o. male who presents to outpatient physical therapy with a referral for medical diagnosis of myofascial pain syndrome of thoracic spine. This patient's chief complaints consist of chronic right sided thoracic spine pain, right shoulder pain, and occasional paresthesia to the right UE, leading to the following functional deficits: limits him with everyday activities/tasks to some amount, working on projects, working on the computer, walking for exercise, sleeping, doing as much as he wants to.  Relevant past medical history and comorbidities include bilateral shoulder surgery (RTC repair, R most recent 12/2020) and subarachnoid hemorrhage in 2017. Patient denies hx of cancer, seizures, unexplained weight loss, unexplained changes in bowel or bladder problems, unexplained stumbling or dropping things, back surgery.   PAIN: Are you having pain? Yes NPRS: Current: 4-5/10,  Best: 3/10, Worst: 8/10. Pain location: medial border of right scapula, level of T5-T6 Pain description: constant, cramping/pinching pain. Area of pain gets bigger and primary location of pain amplifies as pain gets worse.  Aggravating factors: any use of right UE, using mouse at the  computer (switched to using mouse with left hand due to pain), sweeping, washing dishes, repetitive motions  Relieving factors: oxycodone, use of trigger point release ball that suctions to the wall  Irritability: pain increases immediately with activity; reduces once he stops activity, but takes a while to return to baseline 3/10 pain  FUNCTIONAL LIMITATIONS: limits him with everyday activities and tasks, working on projects, working on the computer, walking for exercise, sleeping, doing as much as he wants to  [employer]: working in the shop, building and fixing things  PRECAUTIONS: None  RED FLAGS: Patient denies hx of cancer, seizures, unexplained weight loss, unexplained changes in bowel or bladder problems, unexplained stumbling or dropping things, and spinal surgery. Per patient chart, he does not have hx of lung problems, heart problems, diabetes, or osteoporosis. Patient has hx of a subarachnoid hemorrhage in 2017.     WEIGHT BEARING RESTRICTIONS: No  FALLS:  Has patient fallen in last 6 months? Yes. Number of falls 2 and both falls occurred during the summer of 2024. One fall occurred in the woods  where he was pulled over by his dog who was running. The second fall occurred in July 2024 while sleep walking and stumbling out of bed. This fall resulted in rib fracture on the left side.   LIVING ENVIRONMENT: Lives with: lives with their spouse Lives in: House/apartment Stairs: No Has following equipment at home: None  OCCUPATION: Retired. Works and does projects around [employer].  PLOF: Independent  PATIENT GOALS: to get ride of the pain, to get on an exercise and stretching regimen for his overall health  NEXT MD VISIT:   OBJECTIVE  DIAGNOSTIC FINDINGS:  No recent relevant imaging in chart. Pt denies recent imaging.   SELF- REPORTED FUNCTION FOTO score: 48/100 (thoracic spine questionnaire)  OBSERVATION/INSPECTION Posture Posture (seated): forward head, mildly rounded  shoulders. Sharp kyphosis at CT junction.  No pain with deep breath Anthropometrics Tremor: none Body composition: BMI: 30.5 Muscle bulk: WFL Functional Mobility Transfers: sit <> stand WFL for basic mobility Gait: grossly WFL for household and short community ambulation. More detailed gait analysis deferred to later date as needed.   NEUROLOGICAL Dermatomes C2-T1 appears equal and intact to light touch  SPINE MOTION  CERVICAL SPINE AROM *Indicates pain Flexion: 46 increased right UT pain Extension: 44 Side Flexion:   R 20  L 20 felt tight  Rotation:  R 72 feels tight L 47 feels tight Protraction: no pain  Retrusion: 1.5 inches, no pain Asterisk sign: flexion and left sidebending (rotation unafffected).   SEATED THORACIC SPINE AROM *Indicates pain Flexion: 40 Extension: 12 Side Flexion:   R 35 concordant pulling R side  L 45 pulling on "same spot" on L side Rotation:  R 60 L 55 pulling on "same spot" on L side OP with all movements did not cause pain except L rotation again reproduced similar pain to CC but on L instead of R side   PERIPHERAL JOINT MOTION (in degrees) ACTIVE RANGE OF MOTION (AROM) B UE grossly WFL for basic function.    MUSCLE PERFORMANCE (MMT):  *Indicates pain 03/06/23 Date Date  Joint/Motion R/L R/L R/L  Shoulder     Flexion 4-/5 / /  Abduction (C5) 4/5 / /  External rotation 5/5 / /  Internal rotation 4+*/4+ / /  Elbow     Flexion (C6) 5/5 / /  Extension (C7) 5/5 / /  Hand     Thumb extension (C8) B WFL / /  Finger abduction (T1) B WLF / /  Comments:   SPECIAL TESTS:  CERVICAL SPINE Cervical spine axial compression: negative Spurling's part B:  R = negative, L = reporduces right sided thoracic pain Cervical spine axial distraction: negative   UPPER LIMB NEURODYNAMIC TESTS (all AAROM) Upper Limb Tension Test 1 (ULTT1, Median nerve bias, Magee-ULTT1):  R = negative L  = negative  Upper Limb Tension Test 2B (ULTT2B, Radial  nerve bias, Magee-ULTT3):  R  = negative  L = negative  Upper Limb Tension Test 3 (ULTT3, Ulnar nerve bias, Magee-ULTT4):  R = negative  L = negative  ACCESSORY MOTION: CPA to T4, T5, and T6 reproduced concordant pain with T4 being the most provocative.  All levels of thoracic spine hypomobile to CPA   PALPATION: TTP at bilateral UT, right rhomboids   TREATMENT  Education   PATIENT EDUCATION:  Education details: Education on diagnosis, prognosis, POC, anatomy and physiology of current condition.  Person educated: Patient Education method: Explanation Education comprehension: verbalized understanding and needs further education  HOME EXERCISE PROGRAM: TBA  ASSESSMENT:  CLINICAL IMPRESSION: Patient is a 68 y.o. male referred to outpatient physical therapy with a medical diagnosis of myofascial pain syndrome of thoracic spine who presents with signs and symptoms consistent with chronic thoracic spine pain R > L with reproduction of symptoms with CPA to T4, T5, T6 (worst at T4). Patient has historically had improved pain when he is consistent with a strengthening program, which he was unable to perform for a few months prior to his most recent pain episode due to a fall out of bed that resulted in left rib fractures. No directional preference was found during today's session, but he may benefit from repeated motions testing in the future which could lead to an exercise or manual therapy technique to control pain. Also plan to use graded and pain tolerated exercise to help him return to regular strengthening program. Patient presents with significant pain, posture, ROM, joint stiffness, muscle stiffness, muscle performance (strength/power/endurance) and activity tolerance impairments that are limiting ability to complete everyday activities and tasks, working on projects,  working on the computer, walking for exercise, sleeping, doing as much as he wants to without difficulty. Patient will benefit from skilled physical therapy intervention to address current body structure impairments and activity limitations to improve function and work towards goals set in current POC in order to return to prior level of function or maximal functional improvement.    OBJECTIVE IMPAIRMENTS: decreased activity tolerance, decreased endurance, decreased knowledge of condition, decreased mobility, difficulty walking, decreased ROM, decreased strength, hypomobility, impaired perceived functional ability, increased muscle spasms, impaired flexibility, impaired UE functional use, postural dysfunction, and pain.   ACTIVITY LIMITATIONS: carrying, lifting, bending, sitting, standing, squatting, sleeping, transfers, bed mobility, bathing, dressing, hygiene/grooming, locomotion level, and caring for others  PARTICIPATION LIMITATIONS: meal prep, cleaning, laundry, interpersonal relationship, driving, shopping, community activity, yard work, and everyday activities and tasks, working on projects, working on [employer], walking for exercise, sleeping, doing as much as he wants to   PERSONAL FACTORS: Age, Past/current experiences, Time since onset of injury/illness/exacerbation, and 3+ comorbidities: bilateral shoulder surgery (RTC repair, R most recent 12/2020), bilateral TKA, subarachnoid hemorrhage 2017 with SAH coiling, HTN, hx of hepatitis C (cured), double hernia repair, plantar facial fibromatosis, chronic pain syndrome, former smoker, Chronic bilateral low back pain with bilateral sciatica; Acute bursitis of right shoulder; Lumbar facet arthropathy; Lumbar degenerative disc disease; Localized osteoarthritis of shoulder regions, bilateral; Chronic SI joint pain; Chronic pain syndrome; Pain in joint of left shoulder; Abnormal gait; Anatomical narrow angle; Biomechanical lesion; Cataract nuclear;  Derangement of posterior horn of medial meniscus; Edema; Glaucoma suspect of both eyes; Hip pain; History of total knee arthroplasty; Localized, primary osteoarthritis; Hx of subarachnoid hemophage, Muscle weakness; Arthritis; Plantar fascial fibromatosis; Viral hepatitis C; Arthropathy of left wrist; Chronic pain of left heel; Intercostal neuralgia; and Closed fracture of rib of left side with routine healing,  has a past medical history of Arthritis, Hepatitis, Hypertension, Subarachnoid hemorrhage (HCC) (2017), and Wears hearing aid in both ears.  has a past surgical history that includes Brain surgery; Joint replacement (Bilateral); Shoulder arthroscopy with rotator cuff repair and open biceps tenodesis (Left, 09/08/2018); and Hernia repair (1963). Also had surgery on right shoulder are also affecting patient's functional outcome.   REHAB POTENTIAL:  Good  CLINICAL DECISION MAKING: Evolving/moderate complexity  EVALUATION COMPLEXITY: Moderate   GOALS: Goals reviewed with patient? No  SHORT TERM GOALS: Target date: 03/20/2023  Patient will be independent with initial home exercise program for self-management of symptoms. Baseline: Initial HEP to be provided at visit 2 as appropriate (03/06/23); Goal status: INITIAL   LONG TERM GOALS: Target date: 05/29/2023  Patient will be independent with a long-term home exercise program for self-management of symptoms.  Baseline: Initial HEP to be provided at visit 2 as appropriate (03/06/23); Goal status: INITIAL  2.  Patient will demonstrate improved FOTO to equal or greater than 56 by visit #13 to demonstrate improvement in overall condition and self-reported functional ability.  Baseline: 48/100 (03/06/23); Goal status: INITIAL  3.  Patient will demonstrate full thoracic AROM without provocation of thoracic spine pain and tightness to improve his ability to complete tasks such as walking, sweeping, and washing dishes with less  difficulty. Baseline: Limitations with extension/flexion and concordant pulling sensation with lateral flexion (03/06/23); Goal status: INITIAL  4.  Patient will demonstrate full cervical AROM without provocation of UT pain and thoracic spine tightness to improve ability to complete ADLs with less difficulty.  Baseline: Limited flexion and left side bending causing UT pain (03/06/23); Goal status: INITIAL  5.  Patient will demonstrate improvement in Patient Specific Functional Scale (PSFS) of equal or greater than 3 points to reflect clinically significant improvement in patient's most valued functional activities. Baseline: To be tested at visit 2 as appropriate (03/06/23); Goal status: INITIAL   PLAN:  PT FREQUENCY: 1-2x/week  PT DURATION: 8-12 weeks  PLANNED INTERVENTIONS: 97164- PT Re-evaluation, 97110-Therapeutic exercises, 97530- Therapeutic activity, 97112- Neuromuscular re-education, 97535- Self Care, 16109- Manual therapy, 97014- Electrical stimulation (unattended), Patient/Family education, Dry Needling, Joint mobilization, Spinal mobilization, Cryotherapy, and Moist heat.  PLAN FOR NEXT SESSION: update HEP as appropriate, PSFS, assess for directional preference, establish initial HEP,  progressive postural/UE/functional strengthening, manual therapy as needed, education.   Ramonita Koenig [place], SPT [employer] DPTE   Huntley Dec R. Ilsa Iha, PT, DPT 03/06/23, 4:05 PM  [facility] [address] [place], [place] [address] P: [phone] I F: [phone]

## 2023-03-06 NOTE — Therapy (Deleted)
 OUTPATIENT PHYSICAL THERAPY EVALUATION   Patient Name: Charles Mooney MRN: [id number] DOB:07-30-1955, 68 y.o., male Today's Date: 03/06/2023  END OF SESSION:   Past Medical History:  Diagnosis Date   Arthritis    Hepatitis    c 2000   Hypertension    Subarachnoid hemorrhage (HCC) 2017   Wears hearing aid in both ears    Past Surgical History:  Procedure Laterality Date   BRAIN SURGERY     [facility] COILING 2017   HERNIA REPAIR  1963   double hernia repair   JOINT REPLACEMENT Bilateral    TKR  L-2012, R-2015   SHOULDER ARTHROSCOPY WITH ROTATOR CUFF REPAIR AND OPEN BICEPS TENODESIS Left 09/08/2018   Procedure: LEFT SHOULDER ARTHROSCOPY,SUBSACAPULARIS REPAIR, SUBACROMIAL DECOMP, DISTAL CLAVICLE EXCISION,BICEP TENODESIS, MINI OPEN REGENTEN PATCH APPLICATION;  Surgeon: Lorri Rota, MD;  Location: [facility] ORS;  Service: Orthopedics;  Laterality: Left;   Patient Active Problem List   Diagnosis Date Noted   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   Closed fracture of rib of left side with routine healing 09/04/2022   Chronic pain of left heel 08/31/2021   Arthropathy of left wrist 10/12/2020   Viral hepatitis C 11/02/2019   Lumbar facet arthropathy 04/27/2019   Lumbar degenerative disc disease 04/27/2019   Localized osteoarthritis of shoulder regions, bilateral 04/27/2019   Encounter for long-term opiate analgesic use 04/27/2019   Chronic SI joint pain 04/27/2019   Chronic pain syndrome 04/27/2019   Pain in joint of left shoulder 06/03/2018   Abnormal gait 06/18/2017   Anatomical narrow angle 06/18/2017   Derangement of posterior horn of medial meniscus 06/18/2017   Edema 06/18/2017   Hip pain 06/18/2017   History of total knee arthroplasty 06/18/2017   Localized, primary osteoarthritis 06/18/2017   Muscle weakness 06/18/2017   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   Acute bursitis of right shoulder 06/14/2017   Nonallopathic lesion of sacral region 06/14/2017   Nonallopathic lesion  of thoracic region 06/14/2017   Nonallopathic lesion of lumbosacral region 06/14/2017   Biomechanical lesion 06/14/2017   Chronic bilateral low back pain with bilateral sciatica 05/23/2017   Arthritis 04/19/2013   Cataract nuclear 06/16/2012   Glaucoma suspect of both eyes 06/16/2012    PCP: Rex Castor, MD  REFERRING PROVIDER: Cephus Collin, MD  REFERRING DIAG: myofascial pain syndrome of thoracic spine  Rationale for Evaluation and Treatment: Rehabilitation  THERAPY DIAG:  No diagnosis found.  ONSET DATE: most recent episode October/November 2024  SUBJECTIVE:  SUBJECTIVE STATEMENT: Patient states he has a muscle knot in his right upper back. He states he first had that problem about 15 years ago. It went away, then it came back and was really severe. He had shoulder surgery (but not for that pain) and the back pain went away. Last July he fell and broke a rib on the let side. He got really sedentary at that point. About 3-4 months after that he noticed that knot was coming back. At first it was mainly when using the computer (right handed). He has gotten massages, seen a chiropractor, gotten shockwave therapy, been rolling his back on a ball  he has at home, and stretching it. He doesn't feel like anything is really improving it substantially. He got trigger point injections two days ago and saw his chiropractor yesterday. It is feeling a little better today, but it is "by no means gone." He feels it is getting worse over time. He has switched to using his left hand for mousing. He states there is a band of knots there  Has he started back to exercises? No because of his current pain.   Had a right thoracic trigger point injection (myoneural bock) with steroids for thoracic rhomboids, lat dorsi,  paraspinals on 03/04/2023.   T1: immediately T3: starts to settle as soon as he stops, but it is a very long time for it to go back to how it was before the activity.   He states he is getting intermittent paresthesia in the left 4th/5th fingers (occasionally the whole hand)  Pain worsens as the day goes on, even if he is not doing much  PERTINENT HISTORY:  Patient is a 68 y.o. male who presents to outpatient physical therapy with a referral for medical diagnosis myofascial pain syndrome of thoracic spine. This patient's chief complaints consist of ***, leading to the following functional deficits: ***. Relevant past medical history and comorbidities include bilateral shoulder surgery (RTC repair, R most recent 12/2020), bilateral TKA, subarachnoid hemorrhage 2017 with SAH coiling, HTN, hx of hepatitis C (cured), double hernia repair, plantar facial fibromatosis, chronic pain syndrome, former smoker, Chronic bilateral low back pain with bilateral sciatica; Acute bursitis of right shoulder; Lumbar facet arthropathy; Lumbar degenerative disc disease; Localized osteoarthritis of shoulder regions, bilateral; Chronic SI joint pain; Chronic pain syndrome; Pain in joint of left shoulder; Abnormal gait; Anatomical narrow angle; Biomechanical lesion; Cataract nuclear; Derangement of posterior horn of medial meniscus; Edema; Glaucoma suspect of both eyes; Hip pain; History of total knee arthroplasty; Localized, primary osteoarthritis; Hx of subarachnoid hemophage, Muscle weakness; Arthritis; Plantar fascial fibromatosis; Viral hepatitis C; Arthropathy of left wrist; Chronic pain of left heel; Intercostal neuralgia; and Closed fracture of rib of left side with routine healing,  has a past medical history of Arthritis, Hepatitis, Hypertension, Subarachnoid hemorrhage (HCC) (2017), and Wears hearing aid in both ears.  has a past surgical history that includes Brain surgery; Joint replacement (Bilateral); Shoulder  arthroscopy with rotator cuff repair and open biceps tenodesis (Left, 09/08/2018); and Hernia repair (1963). Also had surgery on right shoulder.    .  Patient denies hx of {redflags:27294}  PAIN: Are you having pain? Yes NPRS: Current: 4-5/10,  Best: 3/10, Worst: 8/10. Pain location: "band of knots" right just lateral to T6 level and medial to scapula Pain description: like someone is pinching it or holding it, like a cramp. intermittent paresthesia in the left 4th/5th fingers (occasionally the whole hand) and a little up the arm.  Aggravating  factors: any use of right UE (especially if it is precision movements), sweeping, washing dishes Relieving factors: mornings, oxycodone , self massage with ball that sticks to the wall and pointy end that he can press into the muscles help temporarily, sleeping on back (but cannot sleep there because of his low back issues).   FUNCTIONAL LIMITATIONS: It makes everything harder. Not working, walking, or completing his projects like he was. It "absorbs my life" and makes it feel like his entire shoulder is achy and it is hard for him to concentrate on anything else, using R UE for anything especially precision movements, repetitive movements, such as sweeping, or washing dishes, sleeping.   LEISURE: fixing things, working in the shop  PRECAUTIONS: {Therapy precautions:24002}  WEIGHT BEARING RESTRICTIONS: {Yes ***/No:24003}  FALLS:  Has patient fallen in last 6 months? Yes. Number of falls 2 stumbled out of of bed while dreaming, walking in the woods and dog was pulling him.  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   PLOF: {PLOF:24004}  PATIENT GOALS: "to get rid of this pain and  to get on a regiment of stretches and exercises to improve my overall health"  NEXT MD VISIT: ***  OBJECTIVE  DIAGNOSTIC FINDINGS:  No recent relevant imaging in chart. Pt denies recent imaging.   SELF- REPORTED FUNCTION FOTO score: 48/100 (thoracic spine questionnaire)  OBSERVATION/INSPECTION Posture Posture (seated): forward head, mildly rounded shoulders. Sharp kyphosis at CT junction.  No pain with deep breath Anthropometrics Tremor: none Body composition: BMI: 30.5 Muscle bulk: WFL Functional Mobility Transfers: sit <> stand WFL for basic mobility Gait: grossly WFL for household and short community ambulation. More detailed gait analysis deferred to later date as needed.   NEUROLOGICAL Dermatomes C2-T1 appears equal and intact to light touch  SPINE MOTION  CERVICAL SPINE AROM *Indicates pain Flexion: 46 increased right UT pain Extension: 44 Side Flexion:   R 20  L 20 felt tight  Rotation:  R 72 feels tight L 47 feels tight Protraction: no pain  Retrusion: 1.5 inches, no pain Asterisk sign: flexion and left sidebending (rotation unafffected).   SEATED THORACIC SPINE AROM *Indicates pain Flexion: 40 Extension: 12 Side Flexion:   R 35 concordant pulling R side  L 45 pulling on "same spot" on L side Rotation:  R 60 L 55 pulling on "same spot" on L side OP with all movements did not cause pain except L rotation again reproduced similar pain to CC but on L instead of R side   PERIPHERAL JOINT MOTION (in degrees) ACTIVE RANGE OF MOTION (AROM) B UE grossly WFL for basic function.    MUSCLE PERFORMANCE (MMT):  *Indicates pain 03/06/23 Date Date  Joint/Motion R/L R/L R/L  Shoulder     Flexion 4-/5 / /  Abduction (C5) 4/5 / /  External rotation 5/5 / /  Internal rotation 4+*/4+ / /  Elbow     Flexion (C6) 5/5 / /  Extension (C7) 5/5 / /  Hand     Thumb extension (C8) B WFL / /  Finger abduction (T1) B WLF / /  Comments:   SPECIAL  TESTS:  CERVICAL SPINE Cervical spine axial compression: negative Spurling's part B:  R = negative, L = reporduces right sided thoracic pain Cervical spine axial  distraction: negative   UPPER LIMB NEURODYNAMIC TESTS (all AAROM) Upper Limb Tension Test 1 (ULTT1, Median nerve bias, Magee-ULTT1):  R = negative L  = negative  Upper Limb Tension Test 2B (ULTT2B, Radial nerve bias, Magee-ULTT3):  R  = negative  L = negative  Upper Limb Tension Test 3 (ULTT3, Ulnar nerve bias, Magee-ULTT4):  R = negative  L = negative  ACCESSORY MOTION: CPA to T4, T5, and T6 reproduced concordant pain with T4 being the most provocative.  All levels of thoracic spine hypomobile to CPA   PALPATION: TTP at bilateral UT, right rhomboids   TREATMENT                                                                                                                          Education   PATIENT EDUCATION:  Education details: Education on diagnosis, prognosis, POC, anatomy and physiology of current condition.  Person educated: Patient Education method: Explanation Education comprehension: verbalized understanding and needs further education  HOME EXERCISE PROGRAM: TBA  ASSESSMENT:  CLINICAL IMPRESSION: Patient is a 68 y.o. male referred to outpatient physical therapy with a medical diagnosis of myofascial pain syndrome of thoracic spine who presents with signs and symptoms consistent with chronic thoracic spine pain R > L with reproduction of symptoms with CPA to T4, T5, T6 (worst at T4). Patient has historically had improved pain when he is consistent with a strengthening program, which he was unable to perform for a few months prior to his most recent pain episode due to a fall out of bed that resulted in left rib fractures. No directional preference was found during today's session, but he may benefit from repeated motions testing in the future which could lead to an exercise or manual therapy technique  to control pain. Also plan to use graded and pain tolerated exercise to help him return to regular strengthening program. Patient presents with significant pain, posture, ROM, joint stiffness, muscle stiffness, muscle performance (strength/power/endurance) and activity tolerance impairments that are limiting ability to complete everyday activities and tasks, working on projects, working on the computer, walking for exercise, sleeping, doing as much as he wants to without difficulty. Patient will benefit from skilled physical therapy intervention to address current body structure impairments and activity limitations to improve function and work towards goals set in current POC in order to return to prior level of function or maximal functional improvement.    OBJECTIVE IMPAIRMENTS: decreased activity tolerance, decreased endurance, decreased knowledge of condition, decreased mobility, difficulty walking, decreased ROM, decreased strength, hypomobility, impaired perceived functional ability, increased muscle spasms, impaired flexibility, impaired UE functional use, postural dysfunction, and pain.   ACTIVITY LIMITATIONS: carrying, lifting, bending, sitting, standing, squatting, sleeping, transfers, bed mobility, bathing, dressing, hygiene/grooming, locomotion level, and caring for others  PARTICIPATION LIMITATIONS: meal prep, cleaning, laundry, interpersonal relationship, driving, shopping, community activity, yard work, and   everyday activities and tasks, working on projects, working on [employer], walking for exercise, sleeping,  doing as much as he wants to   PERSONAL FACTORS: Age, Past/current experiences, Time since onset of injury/illness/exacerbation, and 3+ comorbidities:   bilateral shoulder surgery (RTC repair, R most recent 12/2020), bilateral TKA, subarachnoid hemorrhage 2017 with SAH coiling, HTN, hx of hepatitis C (cured), double hernia repair, plantar facial fibromatosis, chronic pain  syndrome, former smoker, Chronic bilateral low back pain with bilateral sciatica; Acute bursitis of right shoulder; Lumbar facet arthropathy; Lumbar degenerative disc disease; Localized osteoarthritis of shoulder regions, bilateral; Chronic SI joint pain; Chronic pain syndrome; Pain in joint of left shoulder; Abnormal gait; Anatomical narrow angle; Biomechanical lesion; Cataract nuclear; Derangement of posterior horn of medial meniscus; Edema; Glaucoma suspect of both eyes; Hip pain; History of total knee arthroplasty; Localized, primary osteoarthritis; Hx of subarachnoid hemophage, Muscle weakness; Arthritis; Plantar fascial fibromatosis; Viral hepatitis C; Arthropathy of left wrist; Chronic pain of left heel; Intercostal neuralgia; and Closed fracture of rib of left side with routine healing,  has a past medical history of Arthritis, Hepatitis, Hypertension, Subarachnoid hemorrhage (HCC) (2017), and Wears hearing aid in both ears.  has a past surgical history that includes Brain surgery; Joint replacement (Bilateral); Shoulder arthroscopy with rotator cuff repair and open biceps tenodesis (Left, 09/08/2018); and Hernia repair (1963). Also had surgery on right shoulder are also affecting patient's functional outcome.   REHAB POTENTIAL: Good  CLINICAL DECISION MAKING: Evolving/moderate complexity  EVALUATION COMPLEXITY: Moderate   GOALS: Goals reviewed with patient? No  SHORT TERM GOALS: Target date: 03/20/2023  Patient will be independent with initial home exercise program for self-management of symptoms. Baseline: Initial HEP to be provided at visit 2 as appropriate (03/06/23); Goal status: INITIAL   LONG TERM GOALS: Target date: 05/29/2023  Patient will be independent with a long-term home exercise program for self-management of symptoms.  Baseline: Initial HEP to be provided at visit 2 as appropriate (03/06/23); Goal status: INITIAL  2.  Patient will demonstrate improved FOTO to equal or  greater than *** by visit #*** to demonstrate improvement in overall condition and self-reported functional ability.  Baseline: *** (03/06/23); Goal status: INITIAL  3.  *** Baseline: *** (03/06/23); Goal status: INITIAL  4.  *** Baseline: *** (03/06/23); Goal status: INITIAL  5.  Patient will demonstrate improvement in Patient Specific Functional Scale (PSFS) of equal or greater than 3 points to reflect clinically significant improvement in patient's most valued functional activities. Baseline: *** (03/06/23); Goal status: INITIAL  6.  Patient will complete community, work and/or recreational activities without limitation due to current condition.  Baseline: *** (03/06/23); Goal status: INITIAL   PLAN:  PT FREQUENCY: {rehab frequency:25116}  PT DURATION: {rehab duration:25117}  PLANNED INTERVENTIONS: {rehab planned interventions:25118::"97110-Therapeutic exercises","97530- Therapeutic [phone]- Neuromuscular re-education","97535- Self JXBJ","47829- Manual therapy"}.  PLAN FOR NEXT SESSION: ***   Carilyn Charles. Artemio Larry, PT, DPT 03/06/23, 8:40 AM  [facility] [address] [place], [place] [address] P: [phone] I F: [phone]

## 2023-03-11 ENCOUNTER — Encounter: Payer: Self-pay | Admitting: Student in an Organized Health Care Education/Training Program

## 2023-03-12 NOTE — Therapy (Unsigned)
OUTPATIENT PHYSICAL THERAPY TREATMENT   Patient Name: Charles Mooney, 68 y.o., male Today's Date: 03/12/2023  END OF SESSION:    Past Medical History:  Diagnosis Date   Arthritis    Hepatitis    c 2000   Hypertension    Subarachnoid hemorrhage (HCC) 2017   Wears hearing aid in both ears    Past Surgical History:  Procedure Laterality Date   BRAIN SURGERY     [facility] COILING 2017   HERNIA REPAIR  1963   double hernia repair   JOINT REPLACEMENT Bilateral    TKR  L-2012, R-2015   SHOULDER ARTHROSCOPY WITH ROTATOR CUFF REPAIR AND OPEN BICEPS TENODESIS Left 09/08/2018   Procedure: LEFT SHOULDER ARTHROSCOPY,SUBSACAPULARIS REPAIR, SUBACROMIAL DECOMP, DISTAL CLAVICLE EXCISION,BICEP TENODESIS, MINI OPEN REGENTEN PATCH APPLICATION;  Surgeon: Signa Kell, MD;  Location: [facility] ORS;  Service: Orthopedics;  Laterality: Left;   Patient Active Problem List   Diagnosis Date Noted   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   Closed fracture of rib of left side with routine healing 09/04/2022   Chronic pain of left heel 08/31/2021   Arthropathy of left wrist 10/12/2020   Viral hepatitis C 11/02/2019   Lumbar facet arthropathy 04/27/2019   Lumbar degenerative disc disease 04/27/2019   Localized osteoarthritis of shoulder regions, bilateral 04/27/2019   Encounter for long-term opiate analgesic use 04/27/2019   Chronic SI joint pain 04/27/2019   Chronic pain syndrome 04/27/2019   Pain in joint of left shoulder 06/03/2018   Abnormal gait 06/18/2017   Anatomical narrow angle 06/18/2017   Derangement of posterior horn of medial meniscus 06/18/2017   Edema 06/18/2017   Hip pain 06/18/2017   History of total knee arthroplasty 06/18/2017   Localized, primary osteoarthritis 06/18/2017   Muscle weakness 06/18/2017   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   Acute bursitis of right shoulder 06/14/2017   Nonallopathic lesion of sacral region 06/14/2017   Nonallopathic  lesion of thoracic region 06/14/2017   Nonallopathic lesion of lumbosacral region 06/14/2017   Biomechanical lesion 06/14/2017   Chronic bilateral low back pain with bilateral sciatica 05/23/2017   Arthritis 04/19/2013   Cataract nuclear 06/16/2012   Glaucoma suspect of both eyes 06/16/2012    PCP: Enid Baas, MD  REFERRING PROVIDER: Edward Jolly, MD  REFERRING DIAG: myofascial pain syndrome of thoracic spine  Rationale for Evaluation and Treatment: Rehabilitation  THERAPY DIAG:  No diagnosis found.  ONSET DATE: October 2024 (approximately 3 months after rib fracture in July 2024)  SUBJECTIVE:  PERTINENT HISTORY:  Patient is a 68 y.o. male who presents to outpatient physical therapy with a referral for medical diagnosis of myofascial pain syndrome of thoracic spine. This patient's chief complaints consist of chronic right sided thoracic spine pain, right shoulder pain, and occasional paresthesia to the right UE, leading to the following functional deficits: limits him with everyday activities/tasks to some amount, working on projects, working on the computer, walking for exercise, sleeping, doing as much as he wants to.  Relevant past medical history and comorbidities include bilateral shoulder surgery (RTC repair, R most recent 12/2020) and subarachnoid hemorrhage in 2017. Patient denies hx of cancer, seizures, unexplained weight loss, unexplained changes in bowel or bladder problems, unexplained stumbling or dropping things, back surgery.   SUBJECTIVE STATEMENT: ***   PAIN: NPRS: *** Pain location: medial border of right scapula, level of T5-T6  PRECAUTIONS: None  RED FLAGS: Patient denies hx of cancer, seizures, unexplained weight loss, unexplained changes in bowel or bladder  problems, unexplained stumbling or dropping things, and spinal surgery. Per patient chart, he does not have hx of lung problems, heart problems, diabetes, or osteoporosis. Patient has hx of a subarachnoid hemorrhage in 2017.      PATIENT GOALS: to get ride of the pain, to get on an exercise and stretching regimen for his overall health  NEXT MD VISIT:   OBJECTIVE  There were no vitals filed for this visit.  SELF-REPORTED FUNCTION Patient Specific Functional Scale (PSFS)  ***: *** ***: *** ***: *** Average: ***  REPEATED MOTIONS TESTING   TREATMENT                                                                                                                          Education   PATIENT EDUCATION:  Education details: Education on diagnosis, prognosis, POC, anatomy and physiology of current condition.  Person educated: Patient Education method: Explanation Education comprehension: verbalized understanding and needs further education  HOME EXERCISE PROGRAM: TBA  ASSESSMENT:  CLINICAL IMPRESSION: ***  From initial PT evaluation on 03/06/2023:  Patient is a 68 y.o. male referred to outpatient physical therapy with a medical diagnosis of myofascial pain syndrome of thoracic spine who presents with signs and symptoms consistent with chronic thoracic spine pain R > L with reproduction of symptoms with CPA to T4, T5, T6 (worst at T4). Patient has historically had improved pain when he is consistent with a strengthening program, which he was unable to perform for a few months prior to his most recent pain episode due to a fall out of bed that resulted in left rib fractures. No directional preference was found during today's session, but he may benefit from repeated motions testing in the future which could lead to an exercise or manual therapy technique to control pain. Also plan to use graded and pain tolerated exercise to help him return to regular strengthening program. Patient  presents with significant pain, posture, ROM, joint stiffness, muscle stiffness, muscle performance (strength/power/endurance)  and activity tolerance impairments that are limiting ability to complete everyday activities and tasks, working on projects, working on the computer, walking for exercise, sleeping, doing as much as he wants to without difficulty. Patient will benefit from skilled physical therapy intervention to address current body structure impairments and activity limitations to improve function and work towards goals set in current POC in order to return to prior level of function or maximal functional improvement.    OBJECTIVE IMPAIRMENTS: decreased activity tolerance, decreased endurance, decreased knowledge of condition, decreased mobility, difficulty walking, decreased ROM, decreased strength, hypomobility, impaired perceived functional ability, increased muscle spasms, impaired flexibility, impaired UE functional use, postural dysfunction, and pain.   ACTIVITY LIMITATIONS: carrying, lifting, bending, sitting, standing, squatting, sleeping, transfers, bed mobility, bathing, dressing, hygiene/grooming, locomotion level, and caring for others  PARTICIPATION LIMITATIONS: meal prep, cleaning, laundry, interpersonal relationship, driving, shopping, community activity, yard work, and everyday activities and tasks, working on projects, working on [employer], walking for exercise, sleeping, doing as much as he wants to   PERSONAL FACTORS: Age, Past/current experiences, Time since onset of injury/illness/exacerbation, and 3+ comorbidities: bilateral shoulder surgery (RTC repair, R most recent 12/2020), bilateral TKA, subarachnoid hemorrhage 2017 with SAH coiling, HTN, hx of hepatitis C (cured), double hernia repair, plantar facial fibromatosis, chronic pain syndrome, former smoker, Chronic bilateral low back pain with bilateral sciatica; Acute bursitis of right shoulder; Lumbar facet arthropathy;  Lumbar degenerative disc disease; Localized osteoarthritis of shoulder regions, bilateral; Chronic SI joint pain; Chronic pain syndrome; Pain in joint of left shoulder; Abnormal gait; Anatomical narrow angle; Biomechanical lesion; Cataract nuclear; Derangement of posterior horn of medial meniscus; Edema; Glaucoma suspect of both eyes; Hip pain; History of total knee arthroplasty; Localized, primary osteoarthritis; Hx of subarachnoid hemophage, Muscle weakness; Arthritis; Plantar fascial fibromatosis; Viral hepatitis C; Arthropathy of left wrist; Chronic pain of left heel; Intercostal neuralgia; and Closed fracture of rib of left side with routine healing,  has a past medical history of Arthritis, Hepatitis, Hypertension, Subarachnoid hemorrhage (HCC) (2017), and Wears hearing aid in both ears.  has a past surgical history that includes Brain surgery; Joint replacement (Bilateral); Shoulder arthroscopy with rotator cuff repair and open biceps tenodesis (Left, 09/08/2018); and Hernia repair (1963). Also had surgery on right shoulder are also affecting patient's functional outcome.   REHAB POTENTIAL: Good  CLINICAL DECISION MAKING: Evolving/moderate complexity  EVALUATION COMPLEXITY: Moderate   GOALS: Goals reviewed with patient? No  SHORT TERM GOALS: Target date: 03/20/2023  Patient will be independent with initial home exercise program for self-management of symptoms. Baseline: Initial HEP to be provided at visit 2 as appropriate (03/06/23); Goal status: In progress   LONG TERM GOALS: Target date: 05/29/2023  Patient will be independent with a long-term home exercise program for self-management of symptoms.  Baseline: Initial HEP to be provided at visit 2 as appropriate (03/06/23); Goal status: In progress  2.  Patient will demonstrate improved FOTO to equal or greater than 56 by visit #13 to demonstrate improvement in overall condition and self-reported functional ability.  Baseline: 48/100  (03/06/23); Goal status: In progress  3.  Patient will demonstrate full thoracic AROM without provocation of thoracic spine pain and tightness to improve his ability to complete tasks such as walking, sweeping, and washing dishes with less difficulty. Baseline: Limitations with extension/flexion and concordant pulling sensation with lateral flexion (03/06/23); Goal status: In progress  4.  Patient will demonstrate full cervical AROM without provocation of UT pain and thoracic spine tightness to improve  ability to complete ADLs with less difficulty.  Baseline: Limited flexion and left side bending causing UT pain (03/06/23); Goal status: In progress  5.  Patient will demonstrate improvement in Patient Specific Functional Scale (PSFS) of equal or greater than 3 points to reflect clinically significant improvement in patient's most valued functional activities. Baseline: To be tested at visit 2 as appropriate (03/06/23); Goal status: In progress   PLAN:  PT FREQUENCY: 1-2x/week  PT DURATION: 8-12 weeks  PLANNED INTERVENTIONS: 97164- PT Re-evaluation, 97110-Therapeutic exercises, 97530- Therapeutic activity, 97112- Neuromuscular re-education, 97535- Self Care, 16109- Manual therapy, 97014- Electrical stimulation (unattended), Patient/Family education, Dry Needling, Joint mobilization, Spinal mobilization, Cryotherapy, and Moist heat.  PLAN FOR NEXT SESSION: update HEP as appropriate, PSFS, assess for directional preference, establish initial HEP,  progressive postural/UE/functional strengthening, manual therapy as needed, education.   Luretha Murphy. Ilsa Iha, PT, DPT 03/12/23, 1:41 PM  [facility] [address] [place], [place] [address] P: [phone] I F: [phone]

## 2023-03-13 ENCOUNTER — Ambulatory Visit: Payer: Medicare Other | Admitting: Physical Therapy

## 2023-03-13 ENCOUNTER — Encounter: Payer: Self-pay | Admitting: Physical Therapy

## 2023-03-13 VITALS — BP 131/80 | HR 78

## 2023-03-13 DIAGNOSIS — M6281 Muscle weakness (generalized): Secondary | ICD-10-CM

## 2023-03-13 DIAGNOSIS — M546 Pain in thoracic spine: Secondary | ICD-10-CM

## 2023-03-13 NOTE — Therapy (Signed)
OUTPATIENT PHYSICAL THERAPY TREATMENT   Patient Name: Charles Mooney MRN: [id number] DOB:26-Dec-1955, 68 y.o., male Today's Date: 03/13/2023  END OF SESSION:  PT End of Session - 03/13/23 1441     Visit Number 2    Number of Visits 17    Date for PT Re-Evaluation 05/29/23    Authorization Type MEDICARE PART B reporting period from 03/06/23    Progress Note Due on Visit 10    PT Start Time 1306    PT Stop Time 1349    PT Time Calculation (min) 43 min    Activity Tolerance Patient tolerated treatment well    Behavior During Therapy [facility] for tasks assessed/performed              Past Medical History:  Diagnosis Date   Arthritis    Hepatitis    c 2000   Hypertension    Subarachnoid hemorrhage (HCC) 2017   Wears hearing aid in both ears    Past Surgical History:  Procedure Laterality Date   BRAIN SURGERY     [facility] COILING 2017   HERNIA REPAIR  1963   double hernia repair   JOINT REPLACEMENT Bilateral    TKR  L-2012, R-2015   SHOULDER ARTHROSCOPY WITH ROTATOR CUFF REPAIR AND OPEN BICEPS TENODESIS Left 09/08/2018   Procedure: LEFT SHOULDER ARTHROSCOPY,SUBSACAPULARIS REPAIR, SUBACROMIAL DECOMP, DISTAL CLAVICLE EXCISION,BICEP TENODESIS, MINI OPEN REGENTEN PATCH APPLICATION;  Surgeon: Signa Kell, MD;  Location: [facility] ORS;  Service: Orthopedics;  Laterality: Left;   Patient Active Problem List   Diagnosis Date Noted   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   Closed fracture of rib of left side with routine healing 09/04/2022   Chronic pain of left heel 08/31/2021   Arthropathy of left wrist 10/12/2020   Viral hepatitis C 11/02/2019   Lumbar facet arthropathy 04/27/2019   Lumbar degenerative disc disease 04/27/2019   Localized osteoarthritis of shoulder regions, bilateral 04/27/2019   Encounter for long-term opiate analgesic use 04/27/2019   Chronic SI joint pain 04/27/2019   Chronic pain syndrome 04/27/2019   Pain in joint of left shoulder 06/03/2018   Abnormal gait  06/18/2017   Anatomical narrow angle 06/18/2017   Derangement of posterior horn of medial meniscus 06/18/2017   Edema 06/18/2017   Hip pain 06/18/2017   History of total knee arthroplasty 06/18/2017   Localized, primary osteoarthritis 06/18/2017   Muscle weakness 06/18/2017   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   Acute bursitis of right shoulder 06/14/2017   Nonallopathic lesion of sacral region 06/14/2017   Nonallopathic lesion of thoracic region 06/14/2017   Nonallopathic lesion of lumbosacral region 06/14/2017   Biomechanical lesion 06/14/2017   Chronic bilateral low back pain with bilateral sciatica 05/23/2017   Arthritis 04/19/2013   Cataract nuclear 06/16/2012   Glaucoma suspect of both eyes 06/16/2012    PCP: Enid Baas, MD  REFERRING PROVIDER: Edward Jolly, MD  REFERRING DIAG: myofascial pain syndrome of thoracic spine  Rationale for Evaluation and Treatment: Rehabilitation  THERAPY DIAG:  Pain in thoracic spine  Muscle weakness (generalized)  ONSET DATE: October 2024 (approximately 3 months after rib fracture in July 2024)  SUBJECTIVE:  PERTINENT HISTORY:  Patient is a 68 y.o. male who presents to outpatient physical therapy with a referral for medical diagnosis of myofascial pain syndrome of thoracic spine. This patient's chief complaints consist of chronic right sided thoracic spine pain, right shoulder pain, and occasional paresthesia to the right UE, leading to the following functional deficits: limits him with everyday activities/tasks to some amount, working on projects, working on the computer, walking for exercise, sleeping, doing as much as he wants to.  Relevant past medical history and comorbidities include bilateral shoulder surgery (RTC repair, R most recent  12/2020) and subarachnoid hemorrhage in 2017. Patient denies hx of cancer, seizures, unexplained weight loss, unexplained changes in bowel or bladder problems, unexplained stumbling or dropping things, back surgery.    SUBJECTIVE STATEMENT: Patient arrives to session feeling more shoulder pain involvement since last week (posterior acromion near joint line). He has an appointment scheduled with his shoulder surgeon to check out his right shoulder. He states feeling stiffness in his thoracic spine and 6/10 pain between his shoulder blades on the right side. He reports his awareness of his pain is contributing to his limitations to participate in functional activities due to wanting to avoid more pain.       PAIN: Are you having pain? Yes NPRS: 6/10 near right scapula, 4-5/10 in right shoulder (posterior acromion near joint line)    FUNCTIONAL LIMITATIONS: limits him with everyday activities and tasks, working on projects, working on the computer, walking for exercise, sleeping, doing as much as he wants to  [employer]: working in the shop, building and fixing things  PRECAUTIONS: None  RED FLAGS: Patient denies hx of cancer, seizures, unexplained weight loss, unexplained changes in bowel or bladder problems, unexplained stumbling or dropping things, and spinal surgery. Per patient chart, he does not have hx of lung problems, heart problems, diabetes, or osteoporosis. Patient has hx of a subarachnoid hemorrhage in 2017.     WEIGHT BEARING RESTRICTIONS: No  FALLS:  Has patient fallen in last 6 months? Yes. Number of falls 2 and both falls occurred during the summer of 2024. One fall occurred in the woods where he was pulled over by his dog who was running. The second fall occurred in July 2024 while sleep walking and stumbling out of bed. This fall resulted in rib fracture on the left side.    PLOF: Independent  PATIENT GOALS: to get ride of the pain, to get on an exercise and stretching  regimen for his overall health  NEXT MD VISIT:   OBJECTIVE PATIENT SPECIFIC FUNCTIONAL SCALE  1. Exercise: 4  2. Building/Using Tools: 5  3. Walking: 6  Average: 5  Vitals:   03/13/23 1309  BP: 131/80  Pulse: 78  SpO2: 99%    TREATMENT                                                                                                                           Therapeutic exercise:  to centralize symptoms and improve ROM, strength, muscular endurance, and activity tolerance required for successful completion of functional activities.     Seated Thoracic Extensions - 5 second hold x 20    Open book - 1x10 each side  Slight ache in left upper trap during cervical rotation while laying on his right side   Staggered Stance Scapular Retraction and Depression Resistance Band 2x20 with Green band   Education on HEP including handout    Therapeutic activities: dynamic activities for functional strengthening and improved functional activity tolerance.     Squats to green chair with 8# DB over each shoulder - 2x20     Pt required multimodal cuing for proper technique and to facilitate improved neuromuscular control, strength, range of motion, and functional ability resulting in improved performance and form.    PATIENT EDUCATION:  Education details: Exercise purpose and form. Education on HEP including handout.  Person educated: Patient Education method: Explanation, Demonstration, Tactile Cues, Verbal Cues, and Handouts.  Education comprehension: verbalized understanding and needs further education  HOME EXERCISE PROGRAM: Access Code: [email or web address] URL: [email or web address] Date: 03/13/2023 Prepared by: Renetta Suman [place]  Exercises - Dumbbell Squat at Shoulders  - 1 x daily - 2-3 x weekly - 2 sets - 20 reps - Staggered Stance Scapular Retraction and Depression with Resistance  - 2-3 x weekly - 2 sets - 20 reps - Sidelying Thoracic Rotation with Open Book  - 1 x daily -  2 sets - 10 reps - Seated Thoracic Lumbar Extension  - 1 x daily - 1 sets - 20 reps - 5 seconds hold  ASSESSMENT:  CLINICAL IMPRESSION: Patient arrives to first session following initial evaluation with continued pain in his right scapular region and an increase in right shoulder pain since last week. The focus of today's session was to develop an exercise program to improve postural strength/endurance and functional strength to improve functional ability and activity tolerance. Patient tolerated treatment interventions well and was able to complete all exercises with minimal to no increase in pain or discomfort. Patient reports feeling muscle fatigue at the end of the session but no change in his pain (6/10 right scapular region and 4/10 in right shoulder). Patient was provided a handout with his HEP. Patient would benefit from continued management of limiting condition by skilled physical therapist to address remaining impairments and functional limitations to work towards stated goals and return to PLOF or maximal functional independence.     OBJECTIVE IMPAIRMENTS: decreased activity tolerance, decreased endurance, decreased knowledge of condition, decreased mobility, difficulty walking, decreased ROM, decreased strength, hypomobility, impaired perceived functional ability, increased muscle spasms, impaired flexibility, impaired UE functional use, postural dysfunction, and pain.   ACTIVITY LIMITATIONS: carrying, lifting, bending, sitting, standing, squatting, sleeping, transfers, bed mobility, bathing, dressing, hygiene/grooming, locomotion level, and caring for others  PARTICIPATION LIMITATIONS: meal prep, cleaning, laundry, interpersonal relationship, driving, shopping, community activity, yard work, and everyday activities and tasks, working on projects, working on [employer], walking for exercise, sleeping, doing as much as he wants to   PERSONAL FACTORS: Age, Past/current experiences, Time  since onset of injury/illness/exacerbation, and 3+ comorbidities: bilateral shoulder surgery (RTC repair, R most recent 12/2020), bilateral TKA, subarachnoid hemorrhage 2017 with SAH coiling, HTN, hx of hepatitis C (cured), double hernia repair, plantar facial fibromatosis, chronic pain syndrome, former smoker, Chronic bilateral low back pain with bilateral sciatica; Acute bursitis of right shoulder; Lumbar facet arthropathy; Lumbar degenerative disc disease; Localized osteoarthritis of shoulder regions, bilateral; Chronic SI joint  pain; Chronic pain syndrome; Pain in joint of left shoulder; Abnormal gait; Anatomical narrow angle; Biomechanical lesion; Cataract nuclear; Derangement of posterior horn of medial meniscus; Edema; Glaucoma suspect of both eyes; Hip pain; History of total knee arthroplasty; Localized, primary osteoarthritis; Hx of subarachnoid hemophage, Muscle weakness; Arthritis; Plantar fascial fibromatosis; Viral hepatitis C; Arthropathy of left wrist; Chronic pain of left heel; Intercostal neuralgia; and Closed fracture of rib of left side with routine healing,  has a past medical history of Arthritis, Hepatitis, Hypertension, Subarachnoid hemorrhage (HCC) (2017), and Wears hearing aid in both ears.  has a past surgical history that includes Brain surgery; Joint replacement (Bilateral); Shoulder arthroscopy with rotator cuff repair and open biceps tenodesis (Left, 09/08/2018); and Hernia repair (1963). Also had surgery on right shoulder are also affecting patient's functional outcome.   REHAB POTENTIAL: Good  CLINICAL DECISION MAKING: Evolving/moderate complexity  EVALUATION COMPLEXITY: Moderate   GOALS: Goals reviewed with patient? No  SHORT TERM GOALS: Target date: 03/20/2023  Patient will be independent with initial home exercise program for self-management of symptoms. Baseline: Initial HEP to be provided at visit 2 as appropriate (03/06/23); Goal status: In progress   LONG TERM  GOALS: Target date: 05/29/2023  Patient will be independent with a long-term home exercise program for self-management of symptoms.  Baseline: Initial HEP to be provided at visit 2 as appropriate (03/06/23); Goal status: In progress  2.  Patient will demonstrate improved FOTO to equal or greater than 56 by visit #13 to demonstrate improvement in overall condition and self-reported functional ability.  Baseline: 48/100 (03/06/23); Goal status: In progress  3.  Patient will demonstrate full thoracic AROM without provocation of thoracic spine pain and tightness to improve his ability to complete tasks such as walking, sweeping, and washing dishes with less difficulty. Baseline: Limitations with extension/flexion and concordant pulling sensation with lateral flexion (03/06/23); Goal status: In progress  4.  Patient will demonstrate full cervical AROM without provocation of UT pain and thoracic spine tightness to improve ability to complete ADLs with less difficulty.  Baseline: Limited flexion and left side bending causing UT pain (03/06/23); Goal status: In progress  5.  Patient will demonstrate improvement in Patient Specific Functional Scale (PSFS) of equal or greater than 3 points to reflect clinically significant improvement in patient's most valued functional activities. Baseline: To be tested at visit 2 as appropriate (03/06/23); 5/10 at visit #2 (03/13/2023);  Goal status: In progress   PLAN:  PT FREQUENCY: 1-2x/week  PT DURATION: 8-12 weeks  PLANNED INTERVENTIONS: 97164- PT Re-evaluation, 97110-Therapeutic exercises, 97530- Therapeutic activity, 97112- Neuromuscular re-education, 97535- Self Care, 16109- Manual therapy, 97014- Electrical stimulation (unattended), Patient/Family education, Dry Needling, Joint mobilization, Spinal mobilization, Cryotherapy, and Moist heat.  PLAN FOR NEXT SESSION: update HEP as appropriate, establish initial HEP,  progressive postural/UE/functional  strengthening, manual therapy as needed, education.   Arlenis Blaydes [place], SPT [employer] DPTE   Huntley Dec R. Ilsa Iha, PT, DPT 03/13/23, 4:49 PM  [facility] [address] [place], [place] [address] P: [phone] I F: [phone]

## 2023-03-19 ENCOUNTER — Ambulatory Visit
Payer: Medicare Other | Attending: Student in an Organized Health Care Education/Training Program | Admitting: Student in an Organized Health Care Education/Training Program

## 2023-03-19 ENCOUNTER — Ambulatory Visit: Payer: Medicare Other | Admitting: Physical Therapy

## 2023-03-19 ENCOUNTER — Encounter: Payer: Self-pay | Admitting: Physical Therapy

## 2023-03-19 VITALS — BP 138/85 | HR 89 | Temp 97.3°F | Resp 16 | Ht 72.0 in | Wt 225.0 lb

## 2023-03-19 DIAGNOSIS — M6281 Muscle weakness (generalized): Secondary | ICD-10-CM

## 2023-03-19 DIAGNOSIS — M546 Pain in thoracic spine: Secondary | ICD-10-CM

## 2023-03-19 DIAGNOSIS — M47816 Spondylosis without myelopathy or radiculopathy, lumbar region: Secondary | ICD-10-CM | POA: Insufficient documentation

## 2023-03-19 DIAGNOSIS — M7918 Myalgia, other site: Secondary | ICD-10-CM | POA: Diagnosis present

## 2023-03-19 DIAGNOSIS — G894 Chronic pain syndrome: Secondary | ICD-10-CM | POA: Diagnosis present

## 2023-03-19 MED ORDER — METHOCARBAMOL 1000 MG/10ML IJ SOLN
INTRAMUSCULAR | Status: AC
  Filled 2023-03-19: qty 10

## 2023-03-19 MED ORDER — METHOCARBAMOL 1000 MG/10ML IJ SOLN
200.0000 mg | Freq: Once | INTRAMUSCULAR | Status: AC
  Administered 2023-03-19: 200 mg via INTRAMUSCULAR

## 2023-03-19 MED ORDER — KETOROLAC TROMETHAMINE 30 MG/ML IJ SOLN
30.0000 mg | Freq: Once | INTRAMUSCULAR | Status: AC
  Administered 2023-03-19: 30 mg via INTRAMUSCULAR
  Filled 2023-03-19: qty 1

## 2023-03-19 NOTE — Progress Notes (Addendum)
Safety precautions to be maintained throughout the outpatient stay will include: orient to surroundings, keep bed in low position, maintain call bell within reach at all times, provide assistance with transfer out of bed and ambulation.   Patient here for nurse visit for Toradol and Robaxin IM injections for increased pain. Education given and questions answered. Patient tolerated injections well.

## 2023-03-19 NOTE — Therapy (Signed)
OUTPATIENT PHYSICAL THERAPY TREATMENT   Patient Name: Charles Mooney MRN: [id number] DOB:1955/03/15, 68 y.o., male Today's Date: 03/19/2023  END OF SESSION:  PT End of Session - 03/19/23 1726     Visit Number 3    Number of Visits 17    Date for PT Re-Evaluation 05/29/23    Authorization Type MEDICARE PART B reporting period from 03/06/23    Progress Note Due on Visit 10    PT Start Time 1601    PT Stop Time 1650    PT Time Calculation (min) 49 min    Activity Tolerance Patient tolerated treatment well    Behavior During Therapy [facility] for tasks assessed/performed               Past Medical History:  Diagnosis Date   Arthritis    Hepatitis    c 2000   Hypertension    Subarachnoid hemorrhage (HCC) 2017   Wears hearing aid in both ears    Past Surgical History:  Procedure Laterality Date   BRAIN SURGERY     [facility] COILING 2017   HERNIA REPAIR  1963   double hernia repair   JOINT REPLACEMENT Bilateral    TKR  L-2012, R-2015   SHOULDER ARTHROSCOPY WITH ROTATOR CUFF REPAIR AND OPEN BICEPS TENODESIS Left 09/08/2018   Procedure: LEFT SHOULDER ARTHROSCOPY,SUBSACAPULARIS REPAIR, SUBACROMIAL DECOMP, DISTAL CLAVICLE EXCISION,BICEP TENODESIS, MINI OPEN REGENTEN PATCH APPLICATION;  Surgeon: Signa Kell, MD;  Location: [facility] ORS;  Service: Orthopedics;  Laterality: Left;   Patient Active Problem List   Diagnosis Date Noted   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   Closed fracture of rib of left side with routine healing 09/04/2022   Chronic pain of left heel 08/31/2021   Arthropathy of left wrist 10/12/2020   Viral hepatitis C 11/02/2019   Lumbar facet arthropathy 04/27/2019   Lumbar degenerative disc disease 04/27/2019   Localized osteoarthritis of shoulder regions, bilateral 04/27/2019   Encounter for long-term opiate analgesic use 04/27/2019   Chronic SI joint pain 04/27/2019   Chronic pain syndrome 04/27/2019   Pain in joint of left shoulder 06/03/2018   Abnormal gait  06/18/2017   Anatomical narrow angle 06/18/2017   Derangement of posterior horn of medial meniscus 06/18/2017   Edema 06/18/2017   Hip pain 06/18/2017   History of total knee arthroplasty 06/18/2017   Localized, primary osteoarthritis 06/18/2017   Muscle weakness 06/18/2017   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   Acute bursitis of right shoulder 06/14/2017   Nonallopathic lesion of sacral region 06/14/2017   Nonallopathic lesion of thoracic region 06/14/2017   Nonallopathic lesion of lumbosacral region 06/14/2017   Biomechanical lesion 06/14/2017   Chronic bilateral low back pain with bilateral sciatica 05/23/2017   Arthritis 04/19/2013   Cataract nuclear 06/16/2012   Glaucoma suspect of both eyes 06/16/2012    PCP: Enid Baas, MD  REFERRING PROVIDER: Edward Jolly, MD  REFERRING DIAG: myofascial pain syndrome of thoracic spine  Rationale for Evaluation and Treatment: Rehabilitation  THERAPY DIAG:  Pain in thoracic spine  Muscle weakness (generalized)  ONSET DATE: October 2024 (approximately 3 months after rib fracture in July 2024)  SUBJECTIVE:  PERTINENT HISTORY:  Patient is a 68 y.o. male who presents to outpatient physical therapy with a referral for medical diagnosis of myofascial pain syndrome of thoracic spine. This patient's chief complaints consist of chronic right sided thoracic spine pain, right shoulder pain, and occasional paresthesia to the right UE, leading to the following functional deficits: limits him with everyday activities/tasks to some amount, working on projects, working on the computer, walking for exercise, sleeping, doing as much as he wants to.  Relevant past medical history and comorbidities include bilateral shoulder surgery (RTC repair, R most recent  12/2020) and subarachnoid hemorrhage in 2017. Patient denies hx of cancer, seizures, unexplained weight loss, unexplained changes in bowel or bladder problems, unexplained stumbling or dropping things, back surgery.    SUBJECTIVE STATEMENT: Patient arrives to session after receiving Toradol and Robaxin IM injections today for increased pain (injections in bilateral low back for non-specific pain management for the shoulder). Patient reports no change in symptoms since injections but had experienced improved right shoulder pain prior to receiving injections. Patient reports feeling good after leaving after last session but felt severe muscle soreness for a couple days following and could hardly walk. Patient states his right scapular region pain is currently 5/10 NPRS and his right shoulder has calmed down significantly since last session (1/10). Patient states he has had difficulty with sleeping due to discomfort in his back.   PAIN: Are you having pain? Yes NPRS: 5/10 near right scapula, 1/10 in right shoulder (posterior acromion near joint line)    FUNCTIONAL LIMITATIONS: limits him with everyday activities and tasks, working on projects, working on the computer, walking for exercise, sleeping, doing as much as he wants to  [employer]: working in the shop, building and fixing things  PRECAUTIONS: None   WEIGHT BEARING RESTRICTIONS: No  FALLS:  Has patient fallen in last 6 months? Yes. Number of falls 2 and both falls occurred during the summer of 2024. One fall occurred in the woods where he was pulled over by his dog who was running. The second fall occurred in July 2024 while sleep walking and stumbling out of bed. This fall resulted in rib fracture on the left side.    PLOF: Independent  PATIENT GOALS: to get ride of the pain, to get on an exercise and stretching regimen for his overall health  NEXT MD VISIT:   OBJECTIVE PATIENT SPECIFIC FUNCTIONAL SCALE (last measured 03/13/23)  1.  Exercise: 4  2. Building/Using Tools: 5  3. Walking: 6  Average: 5   TREATMENT                                                                                                                           Therapeutic exercise: to centralize symptoms and improve ROM, strength, muscular endurance, and activity tolerance required for successful completion of functional activities.      Sidelying Thoracic Rotation with Open book - 1x5 Discontinued due to painful ache in his neck  when doing exercise. Additional head support with pillows did not help.   Cat/Cow in Quadruped - 2x10   Child's Pose (hands to left and to right) - 30 seconds x 2 each way  Rows with 25# - 2x20   Seated Chest Press with 8.8# Bar (sled bar), 2x15   Discomfort in right shoulder at end range of chest press.  Seated Modified Chest Press at ~50 degrees of shoulder flexion with 5# DB in each hand  2x15   Better tolerance to modification than with sled bar.   Education on HEP including handout    Therapeutic activities: dynamic activities for functional strengthening and improved functional activity tolerance.     Squats to grey chair (17 inches) with airex pad with 8# DB over each shoulder - 2x15   Resistance Pull down (B shoulder ext) with Marches   Blue resistance band, 2x15   Suitcase Carry with 15# DB   4 x 30 feet (each arm)    Pt required multimodal cuing for proper technique and to facilitate improved neuromuscular control, strength, range of motion, and functional ability resulting in improved performance and form.    PATIENT EDUCATION:  Education details: Exercise purpose and form. Education on HEP including handout.  Person educated: Patient Education method: Explanation, Demonstration, Tactile Cues, Verbal Cues, and Handouts.  Education comprehension: verbalized understanding and needs further education  HOME EXERCISE PROGRAM: Access Code: [email or web address] URL: [email or web address] Date:  03/19/2023 Prepared by: Ahonesty Woodfin [place]  Exercises - Dumbbell Squat at Shoulders  - 1 x daily - 2-3 x weekly - 2 sets - 20 reps - Staggered Stance Scapular Retraction and Depression with Resistance  - 2-3 x weekly - 2 sets - 20 reps - Seated Thoracic Lumbar Extension  - 1 x daily - 1 sets - 20 reps - 5 seconds hold - Standing Row with Anchored Resistance  - 3 x weekly - 2 sets - 20 reps - Resistance Pulldown with March  - 3 x weekly - 2 sets - 20 reps - Curator  - 3 x weekly - 2 sets - 1 min each arm carry time - Sitting Chest Press With Kettlebell  - 3 x weekly - 2 sets - 15 reps - Quadruped Cat Cow  - 1 x daily - 2 sets - 10 reps - Child's Pose with Sidebending  - 1 x daily - 2 sets - 30 hold  ASSESSMENT:  CLINICAL IMPRESSION: Patient arrives to session reporting improvement in right shoulder pain and slight improvement in right scapular pain. Patient reports good participation in HEP. The focus of today's session was to continue progressing exercises to increase postural strength/endurance and thoracic spine range of motion to improve functional ability and activity tolerance. Sidelying thoracic rotation with open book was discontinued due to increase in neck pain that did not respond to modifications or increased head support with pillows. Patient had improved tolerance and less shoulder discomfort during chest press exercise after modification to press forward and angle downward with shoulders in neutral position. Patient tolerated all other treatment interventions well and was able to complete all exercises with no increase in pain. Patient provided handout of updated HEP to include well tolerated exercises. Patient reports feeling improvement in symptoms and less pain at end of session. Patient would benefit from continued management of limiting condition by skilled physical therapist to address remaining impairments and functional limitations to work towards stated goals and  return to PLOF or maximal functional independence.   OBJECTIVE  IMPAIRMENTS: decreased activity tolerance, decreased endurance, decreased knowledge of condition, decreased mobility, difficulty walking, decreased ROM, decreased strength, hypomobility, impaired perceived functional ability, increased muscle spasms, impaired flexibility, impaired UE functional use, postural dysfunction, and pain.   ACTIVITY LIMITATIONS: carrying, lifting, bending, sitting, standing, squatting, sleeping, transfers, bed mobility, bathing, dressing, hygiene/grooming, locomotion level, and caring for others  PARTICIPATION LIMITATIONS: meal prep, cleaning, laundry, interpersonal relationship, driving, shopping, community activity, yard work, and everyday activities and tasks, working on projects, working on [employer], walking for exercise, sleeping, doing as much as he wants to   PERSONAL FACTORS: Age, Past/current experiences, Time since onset of injury/illness/exacerbation, and 3+ comorbidities: bilateral shoulder surgery (RTC repair, R most recent 12/2020), bilateral TKA, subarachnoid hemorrhage 2017 with SAH coiling, HTN, hx of hepatitis C (cured), double hernia repair, plantar facial fibromatosis, chronic pain syndrome, former smoker, Chronic bilateral low back pain with bilateral sciatica; Acute bursitis of right shoulder; Lumbar facet arthropathy; Lumbar degenerative disc disease; Localized osteoarthritis of shoulder regions, bilateral; Chronic SI joint pain; Chronic pain syndrome; Pain in joint of left shoulder; Abnormal gait; Anatomical narrow angle; Biomechanical lesion; Cataract nuclear; Derangement of posterior horn of medial meniscus; Edema; Glaucoma suspect of both eyes; Hip pain; History of total knee arthroplasty; Localized, primary osteoarthritis; Hx of subarachnoid hemophage, Muscle weakness; Arthritis; Plantar fascial fibromatosis; Viral hepatitis C; Arthropathy of left wrist; Chronic pain of left heel;  Intercostal neuralgia; and Closed fracture of rib of left side with routine healing,  has a past medical history of Arthritis, Hepatitis, Hypertension, Subarachnoid hemorrhage (HCC) (2017), and Wears hearing aid in both ears.  has a past surgical history that includes Brain surgery; Joint replacement (Bilateral); Shoulder arthroscopy with rotator cuff repair and open biceps tenodesis (Left, 09/08/2018); and Hernia repair (1963). Also had surgery on right shoulder are also affecting patient's functional outcome.   REHAB POTENTIAL: Good  CLINICAL DECISION MAKING: Evolving/moderate complexity  EVALUATION COMPLEXITY: Moderate   GOALS: Goals reviewed with patient? No  SHORT TERM GOALS: Target date: 03/20/2023  Patient will be independent with initial home exercise program for self-management of symptoms. Baseline: Initial HEP to be provided at visit 2 as appropriate (03/06/23); Goal status: In progress   LONG TERM GOALS: Target date: 05/29/2023  Patient will be independent with a long-term home exercise program for self-management of symptoms.  Baseline: Initial HEP to be provided at visit 2 as appropriate (03/06/23); Goal status: In progress  2.  Patient will demonstrate improved FOTO to equal or greater than 56 by visit #13 to demonstrate improvement in overall condition and self-reported functional ability.  Baseline: 48/100 (03/06/23); Goal status: In progress  3.  Patient will demonstrate full thoracic AROM without provocation of thoracic spine pain and tightness to improve his ability to complete tasks such as walking, sweeping, and washing dishes with less difficulty. Baseline: Limitations with extension/flexion and concordant pulling sensation with lateral flexion (03/06/23); Goal status: In progress  4.  Patient will demonstrate full cervical AROM without provocation of UT pain and thoracic spine tightness to improve ability to complete ADLs with less difficulty.  Baseline: Limited  flexion and left side bending causing UT pain (03/06/23); Goal status: In progress  5.  Patient will demonstrate improvement in Patient Specific Functional Scale (PSFS) of equal or greater than 3 points to reflect clinically significant improvement in patient's most valued functional activities. Baseline: To be tested at visit 2 as appropriate (03/06/23); 5/10 at visit #2 (03/13/2023);  Goal status: In progress   PLAN:  PT  FREQUENCY: 1-2x/week  PT DURATION: 8-12 weeks  PLANNED INTERVENTIONS: 97164- PT Re-evaluation, 97110-Therapeutic exercises, 97530- Therapeutic activity, 97112- Neuromuscular re-education, 97535- Self Care, 16109- Manual therapy, 97014- Electrical stimulation (unattended), Patient/Family education, Dry Needling, Joint mobilization, Spinal mobilization, Cryotherapy, and Moist heat.  PLAN FOR NEXT SESSION: update HEP as appropriate, progressive postural/UE/functional strengthening, manual therapy as needed, education.   Adalind Weitz [place], SPT [employer] DPTE   Huntley Dec R. Ilsa Iha, PT, DPT 03/19/23, 6:33 PM  [facility] [address] [place], [place] [address] P: [phone] I F: [phone]

## 2023-03-19 NOTE — Progress Notes (Signed)
PROVIDER NOTE: Information contained herein reflects review and annotations entered in association with encounter. Interpretation of such information and data should be left to medically-trained personnel. Information provided to patient can be located elsewhere in the medical record under "Patient Instructions". Document created using STT-dictation technology, any transcriptional errors that may result from process are unintentional.    Patient: Charles Mooney  Service Category: E/M  Provider: Edward Jolly, MD  DOB: [birth year]  DOS: 03/19/2023  Referring Provider: Enid Baas, MD  MRN: [id number]  Specialty: Interventional Pain Management  PCP: Enid Baas, MD  Type: Established Patient  Setting: Ambulatory outpatient    Location: Office  Delivery: Face-to-face     HPI  [name], a 68 y.o. 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 Mr. Grieshaber's primary complain today is Shoulder Pain and Back Pain  Pertinent problems: [name] has Chronic bilateral low back pain with bilateral sciatica; Acute bursitis of right shoulder; Nonallopathic lesion of sacral region; Lumbar facet arthropathy; Lumbar degenerative disc disease; Localized osteoarthritis of shoulder regions, bilateral; Encounter for long-term opiate analgesic use; Chronic SI joint pain; and Chronic pain syndrome on their pertinent problem list. Pain Assessment: Severity of Chronic pain is reported as a 6 /10. Location: Shoulder Right/Denies. Onset: More than a month ago. Quality: Constant, Dull, Throbbing. Timing: Constant. Modifying factor(s): Ince, heat, stretching. Vitals:  height is 6' (1.829 m) and weight is 225 lb (102.1 kg). His temporal temperature is 97.3 F (36.3 C) (abnormal). His blood pressure is 138/85 and his pulse is 89. His respiration is 16 and oxygen saturation is 97%.  BMI: Estimated body mass index is 30.52 kg/m as calculated from the  following:   Height as of this encounter: 6' (1.829 m).   Weight as of this encounter: 225 lb (102.1 kg). Last encounter: 02/26/2023. Last procedure: 03/04/2023.  Reason for encounter:  nursing visit: IM Robaxin and Toradol .       Pharmacotherapy Assessment  Analgesic:  Percocet 5 mg QID prn   Monitoring: [place] PMP: PDMP not reviewed this encounter.       Pharmacotherapy: No side-effects or adverse reactions reported. Compliance: No problems identified. Effectiveness: Clinically acceptable.  Earlyne Iba, RN  03/19/2023  2:19 PM  Signed Safety precautions to be maintained throughout the outpatient stay will include: orient to surroundings, keep bed in low position, maintain call bell within reach at all times, provide assistance with transfer out of bed and ambulation.   Patient here for nurse visit for Toradol and Robaxin IM injections for increased pain. Education given and questions answered.     No results found for: "CBDTHCR" No results found for: "D8THCCBX" No results found for: "D9THCCBX"  UDS:  Summary  Date Value Ref Range Status  06/05/2022 Note  Final    Comment:    ==================================================================== [facility] (MW) ==================================================================== Test                             Result       Flag       Units  Drug Present and Declared for Prescription Verification   Oxycodone                      717          EXPECTED   ng/mg creat   Oxymorphone  700          EXPECTED   ng/mg creat   Noroxycodone                   2135         EXPECTED   ng/mg creat   Noroxymorphone                 348          EXPECTED   ng/mg creat    Sources of oxycodone are scheduled prescription medications.    Oxymorphone, noroxycodone, and noroxymorphone are expected    metabolites of oxycodone. Oxymorphone is also available as a    scheduled prescription  medication.  ==================================================================== Test                      Result    Flag   Units      Ref Range   Creatinine              23               mg/dL      >=40 ==================================================================== Declared Medications:  The flagging and interpretation on this report are based on the  following declared medications.  Unexpected results may arise from  inaccuracies in the declared medications.   **Note: The testing scope of this panel includes these medications:   Oxycodone (Percocet)   **Note: The testing scope of this panel does not include the  following reported medications:   Acetaminophen (Percocet)  Amlodipine (Norvasc)  Baclofen (Lioresal)  Cannabidiol  Citalopram (Celexa)  Cyclobenzaprine (Flexeril)  Meloxicam (Mobic)  Trazodone (Desyrel) ==================================================================== For clinical consultation, please call [phone]. ====================================================================       ROS  Constitutional: Denies any fever or chills Gastrointestinal: No reported hemesis, hematochezia, vomiting, or acute GI distress Musculoskeletal: Denies any acute onset joint swelling, redness, loss of ROM, or weakness Neurological: No reported episodes of acute onset apraxia, aphasia, dysarthria, agnosia, amnesia, paralysis, loss of coordination, or loss of consciousness  Medication Review  NON FORMULARY, amLODipine, baclofen, citalopram, cyclobenzaprine, meloxicam, oxyCODONE-acetaminophen, and traZODone  History Review  Allergy: Mr. Shamblin is allergic to sulfa antibiotics. Drug: Mr. Bentz  reports that he does not currently use drugs. Alcohol:  reports current alcohol use of about 18.0 standard drinks of alcohol per week. Tobacco:  reports that he quit smoking about 7 months ago. His smoking use included cigarettes. He has never used smokeless  tobacco. Social: Mr. Volner  reports that he quit smoking about 7 months ago. His smoking use included cigarettes. He has never used smokeless tobacco. He reports current alcohol use of about 18.0 standard drinks of alcohol per week. He reports that he does not currently use drugs. Medical:  has a past medical history of Arthritis, Hepatitis, Hypertension, Subarachnoid hemorrhage (HCC) (2017), and Wears hearing aid in both ears. Surgical: Mr. Follett  has a past surgical history that includes Brain surgery; Joint replacement (Bilateral); Shoulder arthroscopy with rotator cuff repair and open biceps tenodesis (Left, 09/08/2018); and Hernia repair (1963). Family: family history is not on file.  Laboratory Chemistry Profile   Renal Lab Results  Component Value Date   BUN 18 09/04/2018   CREATININE 0.81 09/04/2018   GFRAA >60 09/04/2018   GFRNONAA >60 09/04/2018    Hepatic Lab Results  Component Value Date   AST 18 09/04/2018   ALT 20 09/04/2018   ALBUMIN 4.4 09/04/2018   ALKPHOS 39 09/04/2018  Electrolytes Lab Results  Component Value Date   NA 138 09/04/2018   K 3.9 09/04/2018   CL 105 09/04/2018   CALCIUM 9.2 09/04/2018    Bone No results found for: "VD25OH", "VD125OH2TOT", "QM5784ON6", "EX5284XL2", "25OHVITD1", "25OHVITD2", "25OHVITD3", "TESTOFREE", "TESTOSTERONE"  Inflammation (CRP: Acute Phase) (ESR: Chronic Phase) No results found for: "CRP", "ESRSEDRATE", "LATICACIDVEN"       Note: Above Lab results reviewed.    Assessment   Diagnosis  1. Myofascial pain syndrome of thoracic spine   2. Chronic pain syndrome   3. Lumbar facet arthropathy      Updated Problems: No problems updated.  Plan of Care   Mr. KENROY TIMBERMAN has a current medication list which includes the following long-term medication(s): amlodipine and citalopram.  Pharmacotherapy (Medications Ordered): Meds ordered this encounter  Medications   methocarbamol (ROBAXIN) injection 200 mg    ketorolac (TORADOL) 30 MG/ML injection 30 mg   Orders:  No orders of the defined types were placed in this encounter.  Follow-up plan:   Return for Keep sch. appt.      Status post bilateral L3, L4, L5, S1 facet  medial branch nerve blocks #1 on 06/01/2019 50% pain relief for 3 days, consider repeating in future or consider sprint peripheral nerve stimulation of medial branch at L4 , s/p right glenohumeral joint injection 09/14/2020.         Recent Visits Date Type Provider Dept  03/04/23 Procedure visit Edward Jolly, MD [facility]  02/26/23 Office Visit Edward Jolly, MD [facility]  Showing recent visits within past 90 days and meeting all other requirements Today's Visits Date Type Provider Dept  03/19/23 Office Visit Edward Jolly, MD [facility]  Showing today's visits and meeting all other requirements Future Appointments Date Type Provider Dept  05/23/23 Appointment Edward Jolly, MD [facility]  Showing future appointments within next 90 days and meeting all other requirements  I discussed the assessment and treatment plan with the patient. The patient was provided an opportunity to ask questions and all were answered. The patient agreed with the plan and demonstrated an understanding of the instructions.  Patient advised to call back or seek an in-person evaluation if the symptoms or condition worsens.  Duration of encounter: 15 minutes.  Total time on encounter, as per AMA guidelines included both the face-to-face and non-face-to-face time personally spent by the physician and/or other qualified health care professional(s) on the day of the encounter (includes time in activities that require the physician or other qualified health care professional and does not include time in activities normally performed by clinical staff). Physician's time may include the following activities when performed: Preparing to see the patient (e.g.,  pre-charting review of records, searching for previously ordered imaging, lab work, and nerve conduction tests) Review of prior analgesic pharmacotherapies. Reviewing PMP Interpreting ordered tests (e.g., lab work, imaging, nerve conduction tests) Performing post-procedure evaluations, including interpretation of diagnostic procedures Obtaining and/or reviewing separately obtained history Performing a medically appropriate examination and/or evaluation Counseling and educating the patient/family/caregiver Ordering medications, tests, or procedures Referring and communicating with other health care professionals (when not separately reported) Documenting clinical information in the electronic or other health record Independently interpreting results (not separately reported) and communicating results to the patient/ family/caregiver Care coordination (not separately reported)  Note by: Edward Jolly, MD Date: 03/19/2023; Time: 2:26 PM

## 2023-03-21 ENCOUNTER — Other Ambulatory Visit: Payer: Self-pay | Admitting: Medical Genetics

## 2023-03-21 ENCOUNTER — Ambulatory Visit: Payer: Medicare Other | Admitting: Physical Therapy

## 2023-03-21 ENCOUNTER — Encounter: Payer: Self-pay | Admitting: Physical Therapy

## 2023-03-21 DIAGNOSIS — M546 Pain in thoracic spine: Secondary | ICD-10-CM | POA: Diagnosis not present

## 2023-03-21 DIAGNOSIS — M6281 Muscle weakness (generalized): Secondary | ICD-10-CM

## 2023-03-21 NOTE — Therapy (Signed)
OUTPATIENT PHYSICAL THERAPY TREATMENT   Patient Name: DARRIK RICHMAN MRN: [id number] DOB:12-13-1955, 68 y.o., male Today's Date: 03/21/2023  END OF SESSION:  PT End of Session - 03/21/23 1756     Visit Number 4    Number of Visits 17    Date for PT Re-Evaluation 05/29/23    Authorization Type MEDICARE PART B reporting period from 03/06/23    Progress Note Due on Visit 10    PT Start Time 1645    PT Stop Time 1733    PT Time Calculation (min) 48 min    Activity Tolerance Patient tolerated treatment well    Behavior During Therapy [facility] for tasks assessed/performed                Past Medical History:  Diagnosis Date   Arthritis    Hepatitis    c 2000   Hypertension    Subarachnoid hemorrhage (HCC) 2017   Wears hearing aid in both ears    Past Surgical History:  Procedure Laterality Date   BRAIN SURGERY     [facility] COILING 2017   HERNIA REPAIR  1963   double hernia repair   JOINT REPLACEMENT Bilateral    TKR  L-2012, R-2015   SHOULDER ARTHROSCOPY WITH ROTATOR CUFF REPAIR AND OPEN BICEPS TENODESIS Left 09/08/2018   Procedure: LEFT SHOULDER ARTHROSCOPY,SUBSACAPULARIS REPAIR, SUBACROMIAL DECOMP, DISTAL CLAVICLE EXCISION,BICEP TENODESIS, MINI OPEN REGENTEN PATCH APPLICATION;  Surgeon: Signa Kell, MD;  Location: [facility] ORS;  Service: Orthopedics;  Laterality: Left;   Patient Active Problem List   Diagnosis Date Noted   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   Closed fracture of rib of left side with routine healing 09/04/2022   Chronic pain of left heel 08/31/2021   Arthropathy of left wrist 10/12/2020   Viral hepatitis C 11/02/2019   Lumbar facet arthropathy 04/27/2019   Lumbar degenerative disc disease 04/27/2019   Localized osteoarthritis of shoulder regions, bilateral 04/27/2019   Encounter for long-term opiate analgesic use 04/27/2019   Chronic SI joint pain 04/27/2019   Chronic pain syndrome 04/27/2019   Pain in joint of left shoulder 06/03/2018   Abnormal gait  06/18/2017   Anatomical narrow angle 06/18/2017   Derangement of posterior horn of medial meniscus 06/18/2017   Edema 06/18/2017   Hip pain 06/18/2017   History of total knee arthroplasty 06/18/2017   Localized, primary osteoarthritis 06/18/2017   Muscle weakness 06/18/2017   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   Acute bursitis of right shoulder 06/14/2017   Nonallopathic lesion of sacral region 06/14/2017   Nonallopathic lesion of thoracic region 06/14/2017   Nonallopathic lesion of lumbosacral region 06/14/2017   Biomechanical lesion 06/14/2017   Chronic bilateral low back pain with bilateral sciatica 05/23/2017   Arthritis 04/19/2013   Cataract nuclear 06/16/2012   Glaucoma suspect of both eyes 06/16/2012    PCP: Enid Baas, MD  REFERRING PROVIDER: Edward Jolly, MD  REFERRING DIAG: myofascial pain syndrome of thoracic spine  Rationale for Evaluation and Treatment: Rehabilitation  THERAPY DIAG:  Pain in thoracic spine  Muscle weakness (generalized)  ONSET DATE: October 2024 (approximately 3 months after rib fracture in July 2024)  SUBJECTIVE:  PERTINENT HISTORY:  Patient is a 68 y.o. male who presents to outpatient physical therapy with a referral for medical diagnosis of myofascial pain syndrome of thoracic spine. This patient's chief complaints consist of chronic right sided thoracic spine pain, right shoulder pain, and occasional paresthesia to the right UE, leading to the following functional deficits: limits him with everyday activities/tasks to some amount, working on projects, working on the computer, walking for exercise, sleeping, doing as much as he wants to.  Relevant past medical history and comorbidities include bilateral shoulder surgery (RTC repair, R most recent  12/2020) and subarachnoid hemorrhage in 2017. Patient denies hx of cancer, seizures, unexplained weight loss, unexplained changes in bowel or bladder problems, unexplained stumbling or dropping things, back surgery.    SUBJECTIVE STATEMENT: Patient arrives to session feeling about the same as last session. Patient reports no change in symptoms after pain injections. Patient reports pain predominantly in right shoulder blade region and no more pain in right shoulder currently. He states his HEP has been going well and all exercises are feeling good. Patient reports no soreness after last session.   PAIN: Are you having pain? Yes NPRS: 6/10 near right scapula, 0/10 in right shoulder (posterior acromion near joint line)    FUNCTIONAL LIMITATIONS: limits him with everyday activities and tasks, working on projects, working on the computer, walking for exercise, sleeping, doing as much as he wants to  [employer]: working in the shop, building and fixing things  PRECAUTIONS: None   WEIGHT BEARING RESTRICTIONS: No  FALLS:  Has patient fallen in last 6 months? Yes. Number of falls 2 and both falls occurred during the summer of 2024. One fall occurred in the woods where he was pulled over by his dog who was running. The second fall occurred in July 2024 while sleep walking and stumbling out of bed. This fall resulted in rib fracture on the left side.    PLOF: Independent  PATIENT GOALS: to get ride of the pain, to get on an exercise and stretching regimen for his overall health, sleep with less difficulty from pain   NEXT MD VISIT:   OBJECTIVE PATIENT SPECIFIC FUNCTIONAL SCALE (last measured 03/13/23)  1. Exercise: 4  2. Building/Using Tools: 5  3. Walking: 6  Average: 5   TREATMENT                                                                                                                           Therapeutic exercise: to centralize symptoms and improve ROM, strength, muscular  endurance, and activity tolerance required for successful completion of functional activities.      Cat/Cow in Quadruped - 2x10   Child's Pose (hands to left and to right) - 30 seconds x 2 each way  Child's Pose to Upward Dog - 1x10 with ~5 second hold   Prone Press Up - 1x10   Rows with 25# - 2x20   Shoulder Extensions with 15# - 2x20  Education on HEP including handout    Therapeutic activities: dynamic activities for functional strengthening and improved functional activity tolerance.     Squats to grey chair (17 inches) with 8# DB over each shoulder - 2x15    Manual therapy: to reduce pain and tissue tension, improve range of motion, neuromodulation, in order to promote improved ability to complete functional activities.   CPAs T3-T9 (in prone)   Briefly done at each segment to identify segments of pain   3 x 30 seconds, Grade III at T6-T8  Concordant pain reproduced at T4-T9   Right UPAs/Right Rib Springing T4-T8 (in prone)  Reproduction of concordant pain T5-T7 Grade IV, 1-3 bouts of 10-30 seconds each level  Scapular Mobs - Inferior and Superior (in sidelying)  1 x 30 seconds   Scapular Distraction (in prone)   2 x 30 seconds   Palpation: no tenderness to palpation along right paraspinals of thoracic region   Pt required multimodal cuing for proper technique and to facilitate improved neuromuscular control, strength, range of motion, and functional ability resulting in improved performance and form.     PATIENT EDUCATION:  Education details: Exercise purpose and form. Education on HEP including handout.  Person educated: Patient Education method: Explanation, Demonstration, Tactile Cues, Verbal Cues, and Handouts.  Education comprehension: verbalized understanding and needs further education  HOME EXERCISE PROGRAM: Access Code: [email or web address] URL: [email or web address] Date: 03/21/2023 Prepared by: Merit Maybee [place]  Exercises - Dumbbell Squat at  Shoulders  - 1 x daily - 2-3 x weekly - 2 sets - 20 reps - Seated Thoracic Lumbar Extension  - 1 x daily - 1 sets - 20 reps - 5 seconds hold - Standing Row with Anchored Resistance  - 3 x weekly - 2 sets - 20 reps - Resistance Pulldown with March  - 3 x weekly - 2 sets - 20 reps - Curator  - 3 x weekly - 2 sets - 1 min each arm carry time - Sitting Chest Press With Kettlebell  - 3 x weekly - 2 sets - 15 reps - Quadruped Cat Cow  - 1 x daily - 2 sets - 10 reps - Child's Pose with Sidebending  - 1 x daily - 2 sets - 30 hold - Quadruped Rock Back into [employer] Up  - 1 x daily - 1 sets - 10 reps - Prone Press Up  - 1 x daily - 2 sets - 10 reps  ASSESSMENT:  CLINICAL IMPRESSION: Patient arrives to session reporting improvement in right shoulder pain and no change in right scapular pain. Patient reports continued good tolerance and participation with HEP. The focus of today's session was to continue progressing exercises for postural strength/endurance and thoracic spine/scapular range of motion to improve functional ability and activity tolerance. CPAs and mobilizations to the costotransverse joints of T4-T8 done to increase range of motion of the thoracic spine and right transverse processes/ribs. Patient tolerated all other treatment interventions well and was able to complete all exercises with no increase in pain. Patient provided handout of updated HEP to include well tolerated exercises to increase mobility of the spine. Patient reports feeling slight improvement in symptoms and decrease in pain to 5/10 in right scapular region at end of session. Patient would benefit from continued management of limiting condition by skilled physical therapist to address remaining impairments and functional limitations to work towards stated goals and return to PLOF or maximal functional independence.   OBJECTIVE IMPAIRMENTS: decreased activity tolerance, decreased  endurance, decreased  knowledge of condition, decreased mobility, difficulty walking, decreased ROM, decreased strength, hypomobility, impaired perceived functional ability, increased muscle spasms, impaired flexibility, impaired UE functional use, postural dysfunction, and pain.   ACTIVITY LIMITATIONS: carrying, lifting, bending, sitting, standing, squatting, sleeping, transfers, bed mobility, bathing, dressing, hygiene/grooming, locomotion level, and caring for others  PARTICIPATION LIMITATIONS: meal prep, cleaning, laundry, interpersonal relationship, driving, shopping, community activity, yard work, and everyday activities and tasks, working on projects, working on [employer], walking for exercise, sleeping, doing as much as he wants to   PERSONAL FACTORS: Age, Past/current experiences, Time since onset of injury/illness/exacerbation, and 3+ comorbidities: bilateral shoulder surgery (RTC repair, R most recent 12/2020), bilateral TKA, subarachnoid hemorrhage 2017 with SAH coiling, HTN, hx of hepatitis C (cured), double hernia repair, plantar facial fibromatosis, chronic pain syndrome, former smoker, Chronic bilateral low back pain with bilateral sciatica; Acute bursitis of right shoulder; Lumbar facet arthropathy; Lumbar degenerative disc disease; Localized osteoarthritis of shoulder regions, bilateral; Chronic SI joint pain; Chronic pain syndrome; Pain in joint of left shoulder; Abnormal gait; Anatomical narrow angle; Biomechanical lesion; Cataract nuclear; Derangement of posterior horn of medial meniscus; Edema; Glaucoma suspect of both eyes; Hip pain; History of total knee arthroplasty; Localized, primary osteoarthritis; Hx of subarachnoid hemophage, Muscle weakness; Arthritis; Plantar fascial fibromatosis; Viral hepatitis C; Arthropathy of left wrist; Chronic pain of left heel; Intercostal neuralgia; and Closed fracture of rib of left side with routine healing,  has a past medical history of Arthritis, Hepatitis,  Hypertension, Subarachnoid hemorrhage (HCC) (2017), and Wears hearing aid in both ears.  has a past surgical history that includes Brain surgery; Joint replacement (Bilateral); Shoulder arthroscopy with rotator cuff repair and open biceps tenodesis (Left, 09/08/2018); and Hernia repair (1963). Also had surgery on right shoulder are also affecting patient's functional outcome.   REHAB POTENTIAL: Good  CLINICAL DECISION MAKING: Evolving/moderate complexity  EVALUATION COMPLEXITY: Moderate   GOALS: Goals reviewed with patient? No  SHORT TERM GOALS: Target date: 03/20/2023  Patient will be independent with initial home exercise program for self-management of symptoms. Baseline: Initial HEP to be provided at visit 2 as appropriate (03/06/23); Goal status: In progress   LONG TERM GOALS: Target date: 05/29/2023  Patient will be independent with a long-term home exercise program for self-management of symptoms.  Baseline: Initial HEP to be provided at visit 2 as appropriate (03/06/23); Goal status: In progress  2.  Patient will demonstrate improved FOTO to equal or greater than 56 by visit #13 to demonstrate improvement in overall condition and self-reported functional ability.  Baseline: 48/100 (03/06/23); Goal status: In progress  3.  Patient will demonstrate full thoracic AROM without provocation of thoracic spine pain and tightness to improve his ability to complete tasks such as walking, sweeping, and washing dishes with less difficulty. Baseline: Limitations with extension/flexion and concordant pulling sensation with lateral flexion (03/06/23); Goal status: In progress  4.  Patient will demonstrate full cervical AROM without provocation of UT pain and thoracic spine tightness to improve ability to complete ADLs with less difficulty.  Baseline: Limited flexion and left side bending causing UT pain (03/06/23); Goal status: In progress  5.  Patient will demonstrate improvement in  Patient Specific Functional Scale (PSFS) of equal or greater than 3 points to reflect clinically significant improvement in patient's most valued functional activities. Baseline: To be tested at visit 2 as appropriate (03/06/23); 5/10 at visit #2 (03/13/2023);  Goal status: In progress   PLAN:  PT FREQUENCY: 1-2x/week  PT  DURATION: 8-12 weeks  PLANNED INTERVENTIONS: 97164- PT Re-evaluation, 97110-Therapeutic exercises, 97530- Therapeutic activity, 97112- Neuromuscular re-education, 97535- Self Care, 82956- Manual therapy, 97014- Electrical stimulation (unattended), Patient/Family education, Dry Needling, Joint mobilization, Spinal mobilization, Cryotherapy, and Moist heat.  PLAN FOR NEXT SESSION: update HEP as appropriate, progressive postural/UE/functional strengthening, manual therapy as needed, education.   Burr Soffer [place], SPT [employer] DPTE   Huntley Dec R. Ilsa Iha, PT, DPT 03/21/23, 8:15 PM  [facility] [address] [place], [place] [address] P: [phone] I F: [phone]

## 2023-03-27 ENCOUNTER — Ambulatory Visit
Payer: Medicare Other | Attending: Student in an Organized Health Care Education/Training Program | Admitting: Physical Therapy

## 2023-03-27 ENCOUNTER — Other Ambulatory Visit: Payer: Self-pay | Admitting: Student in an Organized Health Care Education/Training Program

## 2023-03-27 ENCOUNTER — Encounter: Payer: Self-pay | Admitting: Physical Therapy

## 2023-03-27 DIAGNOSIS — M6281 Muscle weakness (generalized): Secondary | ICD-10-CM

## 2023-03-27 DIAGNOSIS — M546 Pain in thoracic spine: Secondary | ICD-10-CM | POA: Diagnosis present

## 2023-03-27 DIAGNOSIS — G894 Chronic pain syndrome: Secondary | ICD-10-CM

## 2023-03-27 NOTE — Therapy (Signed)
 OUTPATIENT PHYSICAL THERAPY TREATMENT   Patient Name: Charles Mooney MRN: [id number] DOB:November 06, 1955, 68 y.o., male Today's Date: 03/27/2023  END OF SESSION:  PT End of Session - 03/27/23 1415     Visit Number 5    Number of Visits 17    Date for PT Re-Evaluation 05/29/23    Authorization Type MEDICARE PART B reporting period from 03/06/23    Progress Note Due on Visit 10    PT Start Time 1304    PT Stop Time 1353    PT Time Calculation (min) 49 min    Activity Tolerance Patient tolerated treatment well    Behavior During Therapy [facility] for tasks assessed/performed              Past Medical History:  Diagnosis Date   Arthritis    Hepatitis    c 2000   Hypertension    Subarachnoid hemorrhage (HCC) 2017   Wears hearing aid in both ears    Past Surgical History:  Procedure Laterality Date   BRAIN SURGERY     [facility] COILING 2017   HERNIA REPAIR  1963   double hernia repair   JOINT REPLACEMENT Bilateral    TKR  L-2012, R-2015   SHOULDER ARTHROSCOPY WITH ROTATOR CUFF REPAIR AND OPEN BICEPS TENODESIS Left 09/08/2018   Procedure: LEFT SHOULDER ARTHROSCOPY,SUBSACAPULARIS REPAIR, SUBACROMIAL DECOMP, DISTAL CLAVICLE EXCISION,BICEP TENODESIS, MINI OPEN REGENTEN PATCH APPLICATION;  Surgeon: Tobie Priest, MD;  Location: [facility] ORS;  Service: Orthopedics;  Laterality: Left;   Patient Active Problem List   Diagnosis Date Noted   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   Closed fracture of rib of left side with routine healing 09/04/2022   Chronic pain of left heel 08/31/2021   Arthropathy of left wrist 10/12/2020   Viral hepatitis C 11/02/2019   Lumbar facet arthropathy 04/27/2019   Lumbar degenerative disc disease 04/27/2019   Localized osteoarthritis of shoulder regions, bilateral 04/27/2019   Encounter for long-term opiate analgesic use 04/27/2019   Chronic SI joint pain 04/27/2019   Chronic pain syndrome 04/27/2019   Pain in joint of left shoulder 06/03/2018   Abnormal gait 06/18/2017    Anatomical narrow angle 06/18/2017   Derangement of posterior horn of medial meniscus 06/18/2017   Edema 06/18/2017   Hip pain 06/18/2017   History of total knee arthroplasty 06/18/2017   Localized, primary osteoarthritis 06/18/2017   Muscle weakness 06/18/2017   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   Acute bursitis of right shoulder 06/14/2017   Nonallopathic lesion of sacral region 06/14/2017   Nonallopathic lesion of thoracic region 06/14/2017   Nonallopathic lesion of lumbosacral region 06/14/2017   Biomechanical lesion 06/14/2017   Chronic bilateral low back pain with bilateral sciatica 05/23/2017   Arthritis 04/19/2013   Cataract nuclear 06/16/2012   Glaucoma suspect of both eyes 06/16/2012    PCP: Sherial Bail, MD  REFERRING PROVIDER: Marcelino Nurse, MD  REFERRING DIAG: myofascial pain syndrome of thoracic spine  Rationale for Evaluation and Treatment: Rehabilitation  THERAPY DIAG:  Pain in thoracic spine  Muscle weakness (generalized)  ONSET DATE: October 2024 (approximately 3 months after rib fracture in July 2024)  SUBJECTIVE:  PERTINENT HISTORY:  Patient is a 68 y.o. male who presents to outpatient physical therapy with a referral for medical diagnosis of myofascial pain syndrome of thoracic spine. This patient's chief complaints consist of chronic right sided thoracic spine pain, right shoulder pain, and occasional paresthesia to the right UE, leading to the following functional deficits: limits him with everyday activities/tasks to some amount, working on projects, working on the computer, walking for exercise, sleeping, doing as much as he wants to.  Relevant past medical history and comorbidities include bilateral shoulder surgery (RTC repair, R most recent 12/2020) and  subarachnoid hemorrhage in 2017. Patient denies hx of cancer, seizures, unexplained weight loss, unexplained changes in bowel or bladder problems, unexplained stumbling or dropping things, back surgery.    SUBJECTIVE STATEMENT: Patient arrives to session with resistance bands looped around his shoulder and mid back to allow postural muscles to relax and open his chest. He reports feeling 4/10 in right scapular region and 3/10 in right shoulder. Patient reports the manual therapy from last session did not have much of an impact. He states he has continued to participate in HEP and all exercises are feeling good. Patient reports no soreness following last session.   PAIN: Are you having pain? Yes NPRS: 4/10 near right scapula, 3/10 in right shoulder (posterior acromion near joint line)    FUNCTIONAL LIMITATIONS: limits him with everyday activities and tasks, working on projects, working on the computer, walking for exercise, sleeping, doing as much as he wants to  [employer]: working in the shop, building and fixing things  PRECAUTIONS: None   WEIGHT BEARING RESTRICTIONS: No  FALLS:  Has patient fallen in last 6 months? Yes. Number of falls 2 and both falls occurred during the summer of 2024. One fall occurred in the woods where he was pulled over by his dog who was running. The second fall occurred in July 2024 while sleep walking and stumbling out of bed. This fall resulted in rib fracture on the left side.    PLOF: Independent  PATIENT GOALS: to get ride of the pain, to get on an exercise and stretching regimen for his overall health, sleep with less difficulty from pain   NEXT MD VISIT:   OBJECTIVE PATIENT SPECIFIC FUNCTIONAL SCALE (last measured 03/13/23)  1. Exercise: 4  2. Building/Using Tools: 5  3. Walking: 6  Average: 5  1RM TESTING Bent Over Row with contralateral UE braced (last tested 03/27/2023): Trial 1: 35# R: 1x10  L: 1x10   Trial 2: 65#   R: 1x5 (pt unable to do  anymore)   L: 1x7 (pt unable to do anymore)  1 RM for Right Arm: 75.8 (~50% = 35#) 1 RM for Left Arm: 80.2 (~50% = 40#)   Bent Over Shoulder Scaption (Y):  (last tested 03/27/2023): Trial 1: 5# DB   R: 1x10    L: 1x10   Trial 2: 7# DB   R: 1x8 (pt unable to do anymore)   L: 1x9 (pt unable to do anymore)  1 RM for Right Arm: 8.9 (~50% = 4#) 1 RM for Left Arm: 9.1 (~50% = 4#)   TREATMENT  Therapeutic exercise: to centralize symptoms and improve ROM, strength, muscular endurance, and activity tolerance required for successful completion of functional activities.      Pec Stretch in Doorway - 6 slow breaths x 2  Cat/Cow in Quadruped - 2x10   Child's Pose (hands to left and to right) - 30 seconds x 2 each way  Prone Press Ups - 2x10     Therapeutic activities: dynamic activities for functional strengthening and improved functional activity tolerance.     Bent over row on plinth   1x20 with 10# DB  Bent over row with cable (to improve upper extremity and periscapular strength/endurance to improve upright posture in standing and increase participation in building/working activities)  R: 1x15 with 35# (~ 50% of 1RM)  L: 1x15 with 40# (~ 50% of 1RM)  Bent over shoulder scaption (Y) on plinth (to improve scapular strength/endurance to improve upright posture in standing)   1x3 with 5# DB   Discontinued due to weight being too heavy   1x15 with 3# DB   1RM Testing for bent over rows and bent over scaption (see above)    Squats to green chair with 8# DB over each shoulder - 2x15    Education on HEP including handout   Pt required multimodal cuing for proper technique and to facilitate improved neuromuscular control, strength, range of motion, and functional ability resulting in improved performance and form.     PATIENT EDUCATION:  Education details:  Exercise purpose and form. Education on HEP including handout.  Person educated: Patient Education method: Explanation, Demonstration, Tactile Cues, Verbal Cues, and Handouts.  Education comprehension: verbalized understanding and needs further education  HOME EXERCISE PROGRAM: Access Code: [email or web address] URL: [email or web address] Date: 03/27/2023 Prepared by: Jasmeet Manton  Exercises - Dumbbell Squat at Shoulders  - 1 x daily - 2-3 x weekly - 2 sets - 20 reps - Seated Thoracic Lumbar Extension  - 1 x daily - 1 sets - 20 reps - 5 seconds hold - Standing Row with Anchored Resistance  - 3 x weekly - 2 sets - 20 reps - Resistance Pulldown with March  - 3 x weekly - 2 sets - 20 reps - Curator  - 3 x weekly - 2 sets - 1 min each arm carry time - Sitting Chest Press With Kettlebell  - 3 x weekly - 2 sets - 15 reps - Quadruped Cat Cow  - 1 x daily - 2 sets - 10 reps - Child's Pose with Sidebending  - 1 x daily - 2 sets - 30 hold - Quadruped Rock Back into [employer] Up  - 1 x daily - 1 sets - 10 reps - Prone Press Up  - 1 x daily - 2 sets - 10 reps - Standing Bent Over Single Arm Scapular Row with Table Support with PLB  - 3 x weekly - 2 sets - 15-20 reps - Single Arm Bent Over Shoulder Scaption with Dumbbell  - 3 x weekly - 2 sets - 15-20 reps  ASSESSMENT:  CLINICAL IMPRESSION: Patient arrives to session reporting slight improvement in scapular pain since last session. Patient reports continued participation with HEP and good tolerance. The focus of today's session was to progress exercises for postural strength/endurance and thoracic spine/scapular range of motion to improve functional ability and activity tolerance and determine 1RM for bent over shoulder rows and bent over shoulder scaption to determine optimal weight usage for exercises. Patient tolerated treatment interventions well and was able  to complete all exercises with minimal change in pain. Patient  provided handout of updated HEP to include well tolerated exercises to improve periscapular strength utilizing weight determined to be ~50% of 1RM for respective exercises. Patient reports feeling muscle fatigue like his shoulder/right scapular region were woken up and slight increase in pain to 5/10 at end of session. Patient would benefit from continued management of limiting condition by skilled physical therapist to address remaining impairments and functional limitations to work towards stated goals and return to PLOF or maximal functional independence.   OBJECTIVE IMPAIRMENTS: decreased activity tolerance, decreased endurance, decreased knowledge of condition, decreased mobility, difficulty walking, decreased ROM, decreased strength, hypomobility, impaired perceived functional ability, increased muscle spasms, impaired flexibility, impaired UE functional use, postural dysfunction, and pain.   ACTIVITY LIMITATIONS: carrying, lifting, bending, sitting, standing, squatting, sleeping, transfers, bed mobility, bathing, dressing, hygiene/grooming, locomotion level, and caring for others  PARTICIPATION LIMITATIONS: meal prep, cleaning, laundry, interpersonal relationship, driving, shopping, community activity, yard work, and everyday activities and tasks, working on projects, working on [employer], walking for exercise, sleeping, doing as much as he wants to   PERSONAL FACTORS: Age, Past/current experiences, Time since onset of injury/illness/exacerbation, and 3+ comorbidities: bilateral shoulder surgery (RTC repair, R most recent 12/2020), bilateral TKA, subarachnoid hemorrhage 2017 with SAH coiling, HTN, hx of hepatitis C (cured), double hernia repair, plantar facial fibromatosis, chronic pain syndrome, former smoker, Chronic bilateral low back pain with bilateral sciatica; Acute bursitis of right shoulder; Lumbar facet arthropathy; Lumbar degenerative disc disease; Localized osteoarthritis of shoulder  regions, bilateral; Chronic SI joint pain; Chronic pain syndrome; Pain in joint of left shoulder; Abnormal gait; Anatomical narrow angle; Biomechanical lesion; Cataract nuclear; Derangement of posterior horn of medial meniscus; Edema; Glaucoma suspect of both eyes; Hip pain; History of total knee arthroplasty; Localized, primary osteoarthritis; Hx of subarachnoid hemophage, Muscle weakness; Arthritis; Plantar fascial fibromatosis; Viral hepatitis C; Arthropathy of left wrist; Chronic pain of left heel; Intercostal neuralgia; and Closed fracture of rib of left side with routine healing,  has a past medical history of Arthritis, Hepatitis, Hypertension, Subarachnoid hemorrhage (HCC) (2017), and Wears hearing aid in both ears.  has a past surgical history that includes Brain surgery; Joint replacement (Bilateral); Shoulder arthroscopy with rotator cuff repair and open biceps tenodesis (Left, 09/08/2018); and Hernia repair (1963). Also had surgery on right shoulder are also affecting patient's functional outcome.   REHAB POTENTIAL: Good  CLINICAL DECISION MAKING: Evolving/moderate complexity  EVALUATION COMPLEXITY: Moderate   GOALS: Goals reviewed with patient? No  SHORT TERM GOALS: Target date: 03/20/2023  Patient will be independent with initial home exercise program for self-management of symptoms. Baseline: Initial HEP to be provided at visit 2 as appropriate (03/06/23); Goal status: In progress   LONG TERM GOALS: Target date: 05/29/2023  Patient will be independent with a long-term home exercise program for self-management of symptoms.  Baseline: Initial HEP to be provided at visit 2 as appropriate (03/06/23); Goal status: In progress  2.  Patient will demonstrate improved FOTO to equal or greater than 56 by visit #13 to demonstrate improvement in overall condition and self-reported functional ability.  Baseline: 48/100 (03/06/23); Goal status: In progress  3.  Patient will demonstrate  full thoracic AROM without provocation of thoracic spine pain and tightness to improve his ability to complete tasks such as walking, sweeping, and washing dishes with less difficulty. Baseline: Limitations with extension/flexion and concordant pulling sensation with lateral flexion (03/06/23); Goal status: In progress  4.  Patient will demonstrate full cervical AROM without provocation of UT pain and thoracic spine tightness to improve ability to complete ADLs with less difficulty.  Baseline: Limited flexion and left side bending causing UT pain (03/06/23); Goal status: In progress  5.  Patient will demonstrate improvement in Patient Specific Functional Scale (PSFS) of equal or greater than 3 points to reflect clinically significant improvement in patient's most valued functional activities. Baseline: To be tested at visit 2 as appropriate (03/06/23); 5/10 at visit #2 (03/13/2023);  Goal status: In progress   PLAN:  PT FREQUENCY: 1-2x/week  PT DURATION: 8-12 weeks  PLANNED INTERVENTIONS: 97164- PT Re-evaluation, 97110-Therapeutic exercises, 97530- Therapeutic activity, 97112- Neuromuscular re-education, 97535- Self Care, 02859- Manual therapy, 97014- Electrical stimulation (unattended), Patient/Family education, Dry Needling, Joint mobilization, Spinal mobilization, Cryotherapy, and Moist heat.  PLAN FOR NEXT SESSION: update HEP as appropriate, progressive postural/UE/functional strengthening, manual therapy as needed, education.   Nanie Dunkleberger, SPT [employer] DPTE   Camie R. Juli, PT, DPT 03/27/23, 5:11 PM  [facility] [address] [place], [place] [address] P: [phone] I F: [phone]

## 2023-04-03 ENCOUNTER — Encounter: Payer: Self-pay | Admitting: Physical Therapy

## 2023-04-03 ENCOUNTER — Ambulatory Visit: Payer: Medicare Other | Admitting: Physical Therapy

## 2023-04-03 DIAGNOSIS — M546 Pain in thoracic spine: Secondary | ICD-10-CM

## 2023-04-03 DIAGNOSIS — M6281 Muscle weakness (generalized): Secondary | ICD-10-CM

## 2023-04-03 NOTE — Patient Instructions (Signed)
HOME EXERCISE PROGRAM [[email or web address]] View at [email or web address]" using code: [email or web address] Prayer Stretch -  Repeat 10 Repetitions, Hold 2 Seconds, Complete 2 Sets, Perform 3 Times a Day

## 2023-04-03 NOTE — Therapy (Signed)
OUTPATIENT PHYSICAL THERAPY TREATMENT   Patient Name: KAYVEN ALDACO MRN: [id number] DOB:January 30, 1956, 68 y.o., male Today's Date: 04/03/2023  END OF SESSION:  PT End of Session - 04/03/23 1503     Visit Number 6    Number of Visits 17    Date for PT Re-Evaluation 05/29/23    Authorization Type MEDICARE PART B reporting period from 03/06/23    Progress Note Due on Visit 10    PT Start Time 1036    PT Stop Time 1124    PT Time Calculation (min) 48 min    Activity Tolerance Patient tolerated treatment well    Behavior During Therapy [facility] for tasks assessed/performed               Past Medical History:  Diagnosis Date   Arthritis    Hepatitis    c 2000   Hypertension    Subarachnoid hemorrhage (HCC) 2017   Wears hearing aid in both ears    Past Surgical History:  Procedure Laterality Date   BRAIN SURGERY     [facility] COILING 2017   HERNIA REPAIR  1963   double hernia repair   JOINT REPLACEMENT Bilateral    TKR  L-2012, R-2015   SHOULDER ARTHROSCOPY WITH ROTATOR CUFF REPAIR AND OPEN BICEPS TENODESIS Left 09/08/2018   Procedure: LEFT SHOULDER ARTHROSCOPY,SUBSACAPULARIS REPAIR, SUBACROMIAL DECOMP, DISTAL CLAVICLE EXCISION,BICEP TENODESIS, MINI OPEN REGENTEN PATCH APPLICATION;  Surgeon: Signa Kell, MD;  Location: [facility] ORS;  Service: Orthopedics;  Laterality: Left;   Patient Active Problem List   Diagnosis Date Noted   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   Closed fracture of rib of left side with routine healing 09/04/2022   Chronic pain of left heel 08/31/2021   Arthropathy of left wrist 10/12/2020   Viral hepatitis C 11/02/2019   Lumbar facet arthropathy 04/27/2019   Lumbar degenerative disc disease 04/27/2019   Localized osteoarthritis of shoulder regions, bilateral 04/27/2019   Encounter for long-term opiate analgesic use 04/27/2019   Chronic SI joint pain 04/27/2019   Chronic pain syndrome 04/27/2019   Pain in joint of left shoulder 06/03/2018   Abnormal gait  06/18/2017   Anatomical narrow angle 06/18/2017   Derangement of posterior horn of medial meniscus 06/18/2017   Edema 06/18/2017   Hip pain 06/18/2017   History of total knee arthroplasty 06/18/2017   Localized, primary osteoarthritis 06/18/2017   Muscle weakness 06/18/2017   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   Acute bursitis of right shoulder 06/14/2017   Nonallopathic lesion of sacral region 06/14/2017   Nonallopathic lesion of thoracic region 06/14/2017   Nonallopathic lesion of lumbosacral region 06/14/2017   Biomechanical lesion 06/14/2017   Chronic bilateral low back pain with bilateral sciatica 05/23/2017   Arthritis 04/19/2013   Cataract nuclear 06/16/2012   Glaucoma suspect of both eyes 06/16/2012    PCP: Enid Baas, MD  REFERRING PROVIDER: Edward Jolly, MD  REFERRING DIAG: myofascial pain syndrome of thoracic spine  Rationale for Evaluation and Treatment: Rehabilitation  THERAPY DIAG:  Pain in thoracic spine  Muscle weakness (generalized)  ONSET DATE: October 2024 (approximately 3 months after rib fracture in July 2024)  SUBJECTIVE:  PERTINENT HISTORY:  Patient is a 68 y.o. male who presents to outpatient physical therapy with a referral for medical diagnosis of myofascial pain syndrome of thoracic spine. This patient's chief complaints consist of chronic right sided thoracic spine pain, right shoulder pain, and occasional paresthesia to the right UE, leading to the following functional deficits: limits him with everyday activities/tasks to some amount, working on projects, working on the computer, walking for exercise, sleeping, doing as much as he wants to.  Relevant past medical history and comorbidities include bilateral shoulder surgery (RTC repair, R most recent  12/2020) and subarachnoid hemorrhage in 2017. Patient denies hx of cancer, seizures, unexplained weight loss, unexplained changes in bowel or bladder problems, unexplained stumbling or dropping things, back surgery.    SUBJECTIVE STATEMENT: Bad day today  Some soreness after last session but nothing out of the ordinary  Had acupuncture treatment on Friday - right shoulder/scapular pain improved  Saturday felt a bad muscle ache in left rib area - similar spot where he broke a rib several months ago Rib pain came on suddenly while doing a supine scorpion stretch with his left leg crossed over his trunk  Didn't sleep at all last night - right shoulder blade pain is bad again and left rib pain is also bad  No pain in rib with deep breath but it hurts with movement  Applying direct pressure and rotating to the right hurts the ribs   PAIN: Are you having pain? Yes NPRS: 6/10 near right scapula, 0/10 in right shoulder (posterior acromion near joint line), 7/10 left rib pain     FUNCTIONAL LIMITATIONS: limits him with everyday activities and tasks, working on projects, working on the computer, walking for exercise, sleeping, doing as much as he wants to  [employer]: working in the shop, building and fixing things  PRECAUTIONS: None   WEIGHT BEARING RESTRICTIONS: No  FALLS:  Has patient fallen in last 6 months? Yes. Number of falls 2 and both falls occurred during the summer of 2024. One fall occurred in the woods where he was pulled over by his dog who was running. The second fall occurred in July 2024 while sleep walking and stumbling out of bed. This fall resulted in rib fracture on the left side.    PLOF: Independent  PATIENT GOALS: to get ride of the pain, to get on an exercise and stretching regimen for his overall health, sleep with less difficulty from pain   NEXT MD VISIT:   OBJECTIVE PATIENT SPECIFIC FUNCTIONAL SCALE (last measured 03/13/23)  1. Exercise: 4  2. Building/Using  Tools: 5  3. Walking: 6  Average: 5  1RM TESTING Bent Over Row with contralateral UE braced (last tested 03/27/2023): Trial 1: 35# R: 1x10  L: 1x10   Trial 2: 65#   R: 1x5 (pt unable to do anymore)   L: 1x7 (pt unable to do anymore)  1 RM for Right Arm: 75.8 (~50% = 35#) 1 RM for Left Arm: 80.2 (~50% = 40#)   Bent Over Shoulder Scaption ("Y"):  (last tested 03/27/2023): Trial 1: 5# DB   R: 1x10    L: 1x10   Trial 2: 7# DB   R: 1x8 (pt unable to do anymore)   L: 1x9 (pt unable to do anymore)  1 RM for Right Arm: 8.9 (~50% = 4#) 1 RM for Left Arm: 9.1 (~50% = 4#)   TREATMENT  Therapeutic exercise: to centralize symptoms and improve ROM, strength, muscular endurance, and activity tolerance required for successful completion of functional activities.      AROM seated thoracic rotation to determine asterisk sign    L: no concordant pain    R: no longer experiencing concordant rib pain with right rotation   Cervical retraction seated with lumbar roll at lumbar spine  AROM: 1x10   No change in pain.    With self OP: 1x10  Pain in right scapular region got worse.   Prayer Stretch kneeling on airex pad with elbows on plinth  2 second hold 2x 10/4 repetitions  Felt like a good stretch where his pain is in the right scapular region and shoulders.  Increased pain in low back and provided cuing to bring his knees in closer to reduce lumbar extension during stretch. Pt able to minimize low back discomfort by completing actively held mild posterior pelvic tilt during second set. Added to HEP      Manual therapy: to reduce pain and tissue tension, improve range of motion, neuromodulation, in order to promote improved ability to complete functional activities.   Seated cervical retraction with clinician overpressure:  2x6  Pain in right scapular region  improved.  Seated thoracic extension with clinician OP  Patient with hands at neck and elbows under chin, clinician stabilizing at thoracic spine with one hand and lifting elbows towards celiing with the other.  2x6 Pain in right scapular region improved  Patient seated towards front edge of chair with one leg crossed in figure four position. Patient leaning back of chair (with towel on it) with hands on bilateral shoulders and elbows as close together as possible. Clinician pulling elbows towards celing while stabilizing chair to keep it from tipping over.   2x6  Pain in right scapular region improved.    CPAs at C4-T10: brief bouts of Grade III-IV at each segment to determine if pain could be reproduced    Rib Sprining at level of T5-T7 on left side: brief bouts to determine if concordant rib pain could be reproduced  UPAs at T5-T8: brief bouts of Grade III-IV at each segment to determine if pain could be reproduced   No reproduction of concordant pain throughout intervention   Pt required multimodal cuing for proper technique and to facilitate improved neuromuscular control, strength, range of motion, and functional ability resulting in improved performance and form.   PATIENT EDUCATION:  Education details: Exercise purpose and form. Education on HEP including handout.  Person educated: Patient Education method: Explanation, Demonstration, Tactile Cues, Verbal Cues, and Handouts.  Education comprehension: verbalized understanding and needs further education  HOME EXERCISE PROGRAM: Access Code: [email or web address] URL: [email or web address] Date: 03/27/2023 Prepared by: Archimedes Harold [place]  Exercises - Dumbbell Squat at Shoulders  - 1 x daily - 2-3 x weekly - 2 sets - 20 reps - Seated Thoracic Lumbar Extension  - 1 x daily - 1 sets - 20 reps - 5 seconds hold - Standing Row with Anchored Resistance  - 3 x weekly - 2 sets - 20 reps - Resistance Pulldown with March  - 3 x weekly - 2  sets - 20 reps - Curator  - 3 x weekly - 2 sets - 1 min each arm carry time - Sitting Chest Press With Kettlebell  - 3 x weekly - 2 sets - 15 reps - Quadruped Cat Cow  - 1 x daily - 2 sets - 10 reps - Child's Pose with  Sidebending  - 1 x daily - 2 sets - 30 hold - Quadruped Rock Back into [employer] Up  - 1 x daily - 1 sets - 10 reps - Prone Press Up  - 1 x daily - 2 sets - 10 reps - Standing Bent Over Single Arm Scapular Row with Table Support with PLB  - 3 x weekly - 2 sets - 15-20 reps - Single Arm Bent Over Shoulder Scaption with Dumbbell  - 3 x weekly - 2 sets - 15-20 reps  HOME EXERCISE PROGRAM [[email or web address]] View at "[email or web address]" using code: [email or web address] Prayer Stretch -  Repeat 10 Repetitions, Hold 2 Seconds, Complete 2 Sets, Perform 3 Times a Day   ASSESSMENT:  CLINICAL IMPRESSION: Patient arrives to session having a "bad day" due to increased right scapular pain and a new pain in the left rib region. The focus of today's session was to determine motions are positions that are contributing to his right scapular pain/left rib pain and introduce exercises to improve cervical and thoracic range of motion. AROM cervical retractions, cervical retractions with self-overpressure, and cervical retractions with clinician overpressure done to assess changes in pain and to improve spinal alignment. Patient experienced worsening pain with self-overpressure but improved pain with clinician overpressure. Seated thoracic extensions with clinician OP done to improve thoracic range of motion and resulted in improved symptoms after the second set. Patient reported pain in his low back during prayer stretch that improved after cuing to bring his knees forward to reduce the degree of lumbar extension. Patient provided an update HEP to include kneeling prayer stretch for continued self-management. Patient reports his low back is bother him a bit more but his right scapula feel slightly better  and his ribs felt fine at the end of session. Patient would benefit from continued management of limiting condition by skilled physical therapist to address remaining impairments and functional limitations to work towards stated goals and return to PLOF or maximal functional independence.   OBJECTIVE IMPAIRMENTS: decreased activity tolerance, decreased endurance, decreased knowledge of condition, decreased mobility, difficulty walking, decreased ROM, decreased strength, hypomobility, impaired perceived functional ability, increased muscle spasms, impaired flexibility, impaired UE functional use, postural dysfunction, and pain.   ACTIVITY LIMITATIONS: carrying, lifting, bending, sitting, standing, squatting, sleeping, transfers, bed mobility, bathing, dressing, hygiene/grooming, locomotion level, and caring for others  PARTICIPATION LIMITATIONS: meal prep, cleaning, laundry, interpersonal relationship, driving, shopping, community activity, yard work, and everyday activities and tasks, working on projects, working on [employer], walking for exercise, sleeping, doing as much as he wants to   PERSONAL FACTORS: Age, Past/current experiences, Time since onset of injury/illness/exacerbation, and 3+ comorbidities: bilateral shoulder surgery (RTC repair, R most recent 12/2020), bilateral TKA, subarachnoid hemorrhage 2017 with SAH coiling, HTN, hx of hepatitis C (cured), double hernia repair, plantar facial fibromatosis, chronic pain syndrome, former smoker, Chronic bilateral low back pain with bilateral sciatica; Acute bursitis of right shoulder; Lumbar facet arthropathy; Lumbar degenerative disc disease; Localized osteoarthritis of shoulder regions, bilateral; Chronic SI joint pain; Chronic pain syndrome; Pain in joint of left shoulder; Abnormal gait; Anatomical narrow angle; Biomechanical lesion; Cataract nuclear; Derangement of posterior horn of medial meniscus; Edema; Glaucoma suspect of both eyes; Hip pain;  History of total knee arthroplasty; Localized, primary osteoarthritis; Hx of subarachnoid hemophage, Muscle weakness; Arthritis; Plantar fascial fibromatosis; Viral hepatitis C; Arthropathy of left wrist; Chronic pain of left heel; Intercostal neuralgia; and Closed fracture of rib of left side with routine healing,  has a past medical  history of Arthritis, Hepatitis, Hypertension, Subarachnoid hemorrhage (HCC) (2017), and Wears hearing aid in both ears.  has a past surgical history that includes Brain surgery; Joint replacement (Bilateral); Shoulder arthroscopy with rotator cuff repair and open biceps tenodesis (Left, 09/08/2018); and Hernia repair (1963). Also had surgery on right shoulder are also affecting patient's functional outcome.   REHAB POTENTIAL: Good  CLINICAL DECISION MAKING: Evolving/moderate complexity  EVALUATION COMPLEXITY: Moderate   GOALS: Goals reviewed with patient? No  SHORT TERM GOALS: Target date: 03/20/2023  Patient will be independent with initial home exercise program for self-management of symptoms. Baseline: Initial HEP to be provided at visit 2 as appropriate (03/06/23); Goal status: In progress   LONG TERM GOALS: Target date: 05/29/2023  Patient will be independent with a long-term home exercise program for self-management of symptoms.  Baseline: Initial HEP to be provided at visit 2 as appropriate (03/06/23); Goal status: In progress  2.  Patient will demonstrate improved FOTO to equal or greater than 56 by visit #13 to demonstrate improvement in overall condition and self-reported functional ability.  Baseline: 48/100 (03/06/23); Goal status: In progress  3.  Patient will demonstrate full thoracic AROM without provocation of thoracic spine pain and tightness to improve his ability to complete tasks such as walking, sweeping, and washing dishes with less difficulty. Baseline: Limitations with extension/flexion and concordant pulling sensation with lateral  flexion (03/06/23); Goal status: In progress  4.  Patient will demonstrate full cervical AROM without provocation of UT pain and thoracic spine tightness to improve ability to complete ADLs with less difficulty.  Baseline: Limited flexion and left side bending causing UT pain (03/06/23); Goal status: In progress  5.  Patient will demonstrate improvement in Patient Specific Functional Scale (PSFS) of equal or greater than 3 points to reflect clinically significant improvement in patient's most valued functional activities. Baseline: To be tested at visit 2 as appropriate (03/06/23); 5/10 at visit #2 (03/13/2023);  Goal status: In progress   PLAN:  PT FREQUENCY: 1-2x/week  PT DURATION: 8-12 weeks  PLANNED INTERVENTIONS: 97164- PT Re-evaluation, 97110-Therapeutic exercises, 97530- Therapeutic activity, 97112- Neuromuscular re-education, 97535- Self Care, 40981- Manual therapy, 97014- Electrical stimulation (unattended), Patient/Family education, Dry Needling, Joint mobilization, Spinal mobilization, Cryotherapy, and Moist heat.  PLAN FOR NEXT SESSION: update HEP as appropriate, progressive postural/UE/functional strengthening, manual therapy as needed, education.   Malayiah Mcbrayer [place], SPT [employer] DPTE   Huntley Dec R. Ilsa Iha, PT, DPT 04/03/23, 7:28 PM  [facility] [address] [place], [place] [address] P: [phone] I F: [phone]

## 2023-04-09 ENCOUNTER — Ambulatory Visit: Payer: Medicare Other | Admitting: Physical Therapy

## 2023-04-09 ENCOUNTER — Encounter: Payer: Self-pay | Admitting: Physical Therapy

## 2023-04-09 DIAGNOSIS — M6281 Muscle weakness (generalized): Secondary | ICD-10-CM

## 2023-04-09 DIAGNOSIS — M546 Pain in thoracic spine: Secondary | ICD-10-CM

## 2023-04-09 NOTE — Therapy (Signed)
OUTPATIENT PHYSICAL THERAPY TREATMENT   Patient Name: Charles Mooney MRN: [id number] DOB:1955/09/15, 68 y.o., male Today's Date: 04/09/2023  END OF SESSION:  PT End of Session - 04/09/23 1641     Visit Number 7    Number of Visits 17    Date for PT Re-Evaluation 05/29/23    Authorization Type MEDICARE PART B reporting period from 03/06/23    Progress Note Due on Visit 10    PT Start Time 1303    PT Stop Time 1353    PT Time Calculation (min) 50 min    Activity Tolerance Patient tolerated treatment well    Behavior During Therapy [facility] for tasks assessed/performed                Past Medical History:  Diagnosis Date   Arthritis    Hepatitis    c 2000   Hypertension    Subarachnoid hemorrhage (HCC) 2017   Wears hearing aid in both ears    Past Surgical History:  Procedure Laterality Date   BRAIN SURGERY     [facility] COILING 2017   HERNIA REPAIR  1963   double hernia repair   JOINT REPLACEMENT Bilateral    TKR  L-2012, R-2015   SHOULDER ARTHROSCOPY WITH ROTATOR CUFF REPAIR AND OPEN BICEPS TENODESIS Left 09/08/2018   Procedure: LEFT SHOULDER ARTHROSCOPY,SUBSACAPULARIS REPAIR, SUBACROMIAL DECOMP, DISTAL CLAVICLE EXCISION,BICEP TENODESIS, MINI OPEN REGENTEN PATCH APPLICATION;  Surgeon: Signa Kell, MD;  Location: [facility] ORS;  Service: Orthopedics;  Laterality: Left;   Patient Active Problem List   Diagnosis Date Noted   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   Closed fracture of rib of left side with routine healing 09/04/2022   Chronic pain of left heel 08/31/2021   Arthropathy of left wrist 10/12/2020   Viral hepatitis C 11/02/2019   Lumbar facet arthropathy 04/27/2019   Lumbar degenerative disc disease 04/27/2019   Localized osteoarthritis of shoulder regions, bilateral 04/27/2019   Encounter for long-term opiate analgesic use 04/27/2019   Chronic SI joint pain 04/27/2019   Chronic pain syndrome 04/27/2019   Pain in joint of left shoulder 06/03/2018   Abnormal gait  06/18/2017   Anatomical narrow angle 06/18/2017   Derangement of posterior horn of medial meniscus 06/18/2017   Edema 06/18/2017   Hip pain 06/18/2017   History of total knee arthroplasty 06/18/2017   Localized, primary osteoarthritis 06/18/2017   Muscle weakness 06/18/2017   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   Acute bursitis of right shoulder 06/14/2017   Nonallopathic lesion of sacral region 06/14/2017   Nonallopathic lesion of thoracic region 06/14/2017   Nonallopathic lesion of lumbosacral region 06/14/2017   Biomechanical lesion 06/14/2017   Chronic bilateral low back pain with bilateral sciatica 05/23/2017   Arthritis 04/19/2013   Cataract nuclear 06/16/2012   Glaucoma suspect of both eyes 06/16/2012    PCP: Enid Baas, MD  REFERRING PROVIDER: Edward Jolly, MD  REFERRING DIAG: myofascial pain syndrome of thoracic spine  Rationale for Evaluation and Treatment: Rehabilitation  THERAPY DIAG:  Pain in thoracic spine  Muscle weakness (generalized)  ONSET DATE: October 2024 (approximately 3 months after rib fracture in July 2024)  SUBJECTIVE:  PERTINENT HISTORY:  Patient is a 68 y.o. male who presents to outpatient physical therapy with a referral for medical diagnosis of myofascial pain syndrome of thoracic spine. This patient's chief complaints consist of chronic right sided thoracic spine pain, right shoulder pain, and occasional paresthesia to the right UE, leading to the following functional deficits: limits him with everyday activities/tasks to some amount, working on projects, working on the computer, walking for exercise, sleeping, doing as much as he wants to.  Relevant past medical history and comorbidities include bilateral shoulder surgery (RTC repair, R most recent  12/2020) and subarachnoid hemorrhage in 2017. Patient denies hx of cancer, seizures, unexplained weight loss, unexplained changes in bowel or bladder problems, unexplained stumbling or dropping things, back surgery.    SUBJECTIVE STATEMENT: Feeling "reasonable" today  Feeling a reoccurring "old" pain today - left heel pain  Left heel pain has been creeping back in over the last month Got worse today after a long walk  Left rib pain is pretty much gone - found the stretch that makes it worse and stopped doing that (scorpion stretch)  Acupuncturist has helped the right scapula pain significantly - pain goes away completely after acupuncture (for a couple days) Wants to get more flexible and stronger going forward  Wants a strength/flexibility routine  Thursday was last acupuncture session - went away for 2 days and slowly came back   Exercise-related information:  Overall goal: wants a training program/plan to help him get stronger and more flexible to support his pain reduction goals. Wants to know specifically what to do each session.  Available equipment:  Adjustable plate dumbbells (wide range of weight) Barbell + weights to load it to 400# Bench (adjustable so can do inclined) Preacher curl bench Curl bar (crooked not loopy kind) Inversion chair (likes this) Aerobic exercise: Walking in the woods with his 70# dog Patient's goal: 1 hour daily Recent success: 1 hour 7 out of 14 days Known barriers: cannot walk when he takes a pain pill due to safety concerns, so not able to perform if he is too painful.  Strength training:  Patient's goal: 1 in the mornings 7 days a week (with one day per week likely missed).  Recent success: 15-45 min more days than not (more often 15 min) Known barriers: boredom Other info: pt states he "has all day" when asked how much time he has to exercise.   PAIN: Are you having pain? Yes NPRS: 4/10 near right scapula, 0/10 in right shoulder (posterior  acromion near joint line), 1/10 left rib pain     FUNCTIONAL LIMITATIONS: limits him with everyday activities and tasks, working on projects, working on the computer, walking for exercise, sleeping, doing as much as he wants to  [employer]: working in the shop, building and fixing things  PRECAUTIONS: None   WEIGHT BEARING RESTRICTIONS: No  FALLS:  Has patient fallen in last 6 months? Yes. Number of falls 2 and both falls occurred during the summer of 2024. One fall occurred in the woods where he was pulled over by his dog who was running. The second fall occurred in July 2024 while sleep walking and stumbling out of bed. This fall resulted in rib fracture on the left side.    PLOF: Independent  PATIENT GOALS: to get rid of the pain, to get on an exercise and stretching regimen for his overall health, sleep with less difficulty from pain   NEXT MD VISIT:   OBJECTIVE PATIENT SPECIFIC FUNCTIONAL SCALE (  last measured 03/13/23)  1. Exercise: 4  2. Building/Using Tools: 5  3. Walking: 6  Average: 5  1RM TESTING Bent Over Row with contralateral UE braced (last tested 03/27/2023): Trial 1: 35# R: 1x10  L: 1x10   Trial 2: 65#   R: 1x5 (pt unable to do anymore)   L: 1x7 (pt unable to do anymore)  1 RM for Right Arm: 75.8 (~50% = 35#) 1 RM for Left Arm: 80.2 (~50% = 40#)   Bent Over Shoulder Scaption ("Y"):  (last tested 03/27/2023): Trial 1: 5# DB   R: 1x10    L: 1x10   Trial 2: 7# DB   R: 1x8 (pt unable to do anymore)   L: 1x9 (pt unable to do anymore)  1 RM for Right Arm: 8.9 (~50% = 4#) 1 RM for Left Arm: 9.1 (~50% = 4#)   TREATMENT                                                                                                                           Therapeutic exercise: to centralize symptoms and improve ROM, strength, muscular endurance, and activity tolerance required for successful completion of functional activities.      Modified prayer stretch standing at  wall   1x2   Discontinued due to feeling pain in his low back.    Demonstrated exercises he has been doing at home to check form/technique (approximately 2-3 reps of each).   Modified latissimus dorsi stretch Matrix machine with sustained squat    Crossbody stretched grasping Matrix machine    Seated thoracic extensions   Seated lumbar flexion ball roll outs    Pec/Abdominal stretch with snow angels laying on clear theraball     Side bending thoracic stretch on clear theraball    Standing shoulder extensions with Blue theraband    Standing "T" with Blue theraband (facing anchor)   Standing multi-angle "snow angel" with Blue theraband (facing away from anchor)      Therapeutic activities: dynamic therapeutic activities incorporating MULTIPLE parameters or areas of the body designed to achieve improved functional performance.       Half kneeling thoracic rotations with blue theraband around distal arm and hand behind head (to improve mobility in thoracic rotation and strengthen to improve pulling tasks) 1x10 each side  Also approx 2 reps per side in quadruped  Bent over row with 20# (weight too easy)   1x5   Bent over row (three point row) with 40# (to improve mobility in thoracic rotation and strengthen UE/periscapular muscles to improve pulling/lifting tasks) 1x6 L arm  1x8 R arm Cuing to reach for the floor to promote more thoracic rotation.    Verbally updated HEP to include half kneeling thoracic rotations and bent over rows  Pt required multimodal cuing for proper technique and to facilitate improved neuromuscular control, strength, range of motion, and functional ability resulting in improved performance and form.    PATIENT EDUCATION:  Education  details: Exercise purpose and form. Education on HEP including handout.  Person educated: Patient Education method: Explanation, Demonstration, Tactile Cues, Verbal Cues, and Handouts.  Education comprehension: verbalized  understanding and needs further education  HOME EXERCISE PROGRAM: Access Code: [email or web address] URL: [email or web address] Date: 03/27/2023 Prepared by: Deeanna Beightol [place]  Exercises - Dumbbell Squat at Shoulders  - 1 x daily - 2-3 x weekly - 2 sets - 20 reps - Seated Thoracic Lumbar Extension  - 1 x daily - 1 sets - 20 reps - 5 seconds hold - Standing Row with Anchored Resistance  - 3 x weekly - 2 sets - 20 reps - Resistance Pulldown with March  - 3 x weekly - 2 sets - 20 reps - Curator  - 3 x weekly - 2 sets - 1 min each arm carry time - Sitting Chest Press With Kettlebell  - 3 x weekly - 2 sets - 15 reps - Quadruped Cat Cow  - 1 x daily - 2 sets - 10 reps - Child's Pose with Sidebending  - 1 x daily - 2 sets - 30 hold - Quadruped Rock Back into [employer] Up  - 1 x daily - 1 sets - 10 reps - Prone Press Up  - 1 x daily - 2 sets - 10 reps - Standing Bent Over Single Arm Scapular Row with Table Support with PLB  - 3 x weekly - 2 sets - 15-20 reps - Single Arm Bent Over Shoulder Scaption with Dumbbell  - 3 x weekly - 2 sets - 15-20 reps  HOME EXERCISE PROGRAM [[email or web address]] View at [email or web address]" using code: [email or web address] Prayer Stretch -  Repeat 10 Repetitions, Hold 2 Seconds, Complete 2 Sets, Perform 3 Times a Day   ASSESSMENT:  CLINICAL IMPRESSION: Patient arrives reporting improved pain in right scapular region since last session. He reports having improved symptoms following acupuncture treatment and wants a training program to help him get stronger and more flexible. The focus of today's session was to review current HEP, determine patient strength/mobility goals, and incorporate thoracic spine/rotational/upper extremity strengthening exercises to incorporate into his exercise routine. Patient provided multimodal cuing for exercises he has implemented at home to improve form/technique. Patient provided education on therapeutic exercise/activity dosing for  hypertrophy vs. strength vs. endurance goals. Plan to design an exercise program following periodization phases to improve his overall strength and mobility to work towards his goals of pain reduction. Patient reports feeling out of breath at the end of session but no change in pain. Patient would benefit from continued management of limiting condition by skilled physical therapist to address remaining impairments and functional limitations to work towards stated goals and return to PLOF or maximal functional independence.   OBJECTIVE IMPAIRMENTS: decreased activity tolerance, decreased endurance, decreased knowledge of condition, decreased mobility, difficulty walking, decreased ROM, decreased strength, hypomobility, impaired perceived functional ability, increased muscle spasms, impaired flexibility, impaired UE functional use, postural dysfunction, and pain.   ACTIVITY LIMITATIONS: carrying, lifting, bending, sitting, standing, squatting, sleeping, transfers, bed mobility, bathing, dressing, hygiene/grooming, locomotion level, and caring for others  PARTICIPATION LIMITATIONS: meal prep, cleaning, laundry, interpersonal relationship, driving, shopping, community activity, yard work, and everyday activities and tasks, working on projects, working on [employer], walking for exercise, sleeping, doing as much as he wants to   PERSONAL FACTORS: Age, Past/current experiences, Time since onset of injury/illness/exacerbation, and 3+ comorbidities: bilateral shoulder surgery (RTC repair, R most recent 12/2020), bilateral TKA, subarachnoid hemorrhage 2017 with SAH coiling, HTN, hx  of hepatitis C (cured), double hernia repair, plantar facial fibromatosis, chronic pain syndrome, former smoker, Chronic bilateral low back pain with bilateral sciatica; Acute bursitis of right shoulder; Lumbar facet arthropathy; Lumbar degenerative disc disease; Localized osteoarthritis of shoulder regions, bilateral; Chronic SI joint  pain; Chronic pain syndrome; Pain in joint of left shoulder; Abnormal gait; Anatomical narrow angle; Biomechanical lesion; Cataract nuclear; Derangement of posterior horn of medial meniscus; Edema; Glaucoma suspect of both eyes; Hip pain; History of total knee arthroplasty; Localized, primary osteoarthritis; Hx of subarachnoid hemophage, Muscle weakness; Arthritis; Plantar fascial fibromatosis; Viral hepatitis C; Arthropathy of left wrist; Chronic pain of left heel; Intercostal neuralgia; and Closed fracture of rib of left side with routine healing,  has a past medical history of Arthritis, Hepatitis, Hypertension, Subarachnoid hemorrhage (HCC) (2017), and Wears hearing aid in both ears.  has a past surgical history that includes Brain surgery; Joint replacement (Bilateral); Shoulder arthroscopy with rotator cuff repair and open biceps tenodesis (Left, 09/08/2018); and Hernia repair (1963). Also had surgery on right shoulder are also affecting patient's functional outcome.   REHAB POTENTIAL: Good  CLINICAL DECISION MAKING: Evolving/moderate complexity  EVALUATION COMPLEXITY: Moderate   GOALS: Goals reviewed with patient? No  SHORT TERM GOALS: Target date: 03/20/2023  Patient will be independent with initial home exercise program for self-management of symptoms. Baseline: Initial HEP to be provided at visit 2 as appropriate (03/06/23); Goal status: In progress   LONG TERM GOALS: Target date: 05/29/2023  Patient will be independent with a long-term home exercise program for self-management of symptoms.  Baseline: Initial HEP to be provided at visit 2 as appropriate (03/06/23); Goal status: In progress  2.  Patient will demonstrate improved FOTO to equal or greater than 56 by visit #13 to demonstrate improvement in overall condition and self-reported functional ability.  Baseline: 48/100 (03/06/23); Goal status: In progress  3.  Patient will demonstrate full thoracic AROM without provocation  of thoracic spine pain and tightness to improve his ability to complete tasks such as walking, sweeping, and washing dishes with less difficulty. Baseline: Limitations with extension/flexion and concordant pulling sensation with lateral flexion (03/06/23); Goal status: In progress  4.  Patient will demonstrate full cervical AROM without provocation of UT pain and thoracic spine tightness to improve ability to complete ADLs with less difficulty.  Baseline: Limited flexion and left side bending causing UT pain (03/06/23); Goal status: In progress  5.  Patient will demonstrate improvement in Patient Specific Functional Scale (PSFS) of equal or greater than 3 points to reflect clinically significant improvement in patient's most valued functional activities. Baseline: To be tested at visit 2 as appropriate (03/06/23); 5/10 at visit #2 (03/13/2023);  Goal status: In progress   PLAN:  PT FREQUENCY: 1-2x/week  PT DURATION: 8-12 weeks  PLANNED INTERVENTIONS: 97164- PT Re-evaluation, 97110-Therapeutic exercises, 97530- Therapeutic activity, 97112- Neuromuscular re-education, 97535- Self Care, 16109- Manual therapy, 97014- Electrical stimulation (unattended), Patient/Family education, Dry Needling, Joint mobilization, Spinal mobilization, Cryotherapy, and Moist heat.  PLAN FOR NEXT SESSION: update HEP as appropriate, progressive postural/UE/functional strengthening, manual therapy as needed, education.   Avice Funchess [place], SPT [employer] DPTE   Huntley Dec R. Ilsa Iha, PT, DPT 04/09/23, 8:37 PM  [facility] [address] [place], [place] [address] P: [phone] I F: [phone]

## 2023-04-11 ENCOUNTER — Ambulatory Visit: Payer: Medicare Other | Admitting: Physical Therapy

## 2023-04-11 ENCOUNTER — Encounter: Payer: Self-pay | Admitting: Physical Therapy

## 2023-04-11 DIAGNOSIS — M546 Pain in thoracic spine: Secondary | ICD-10-CM

## 2023-04-11 DIAGNOSIS — M6281 Muscle weakness (generalized): Secondary | ICD-10-CM

## 2023-04-11 NOTE — Therapy (Signed)
OUTPATIENT PHYSICAL THERAPY TREATMENT   Patient Name: Charles Mooney MRN: [id number] DOB:30-Jul-1955, 68 y.o., male Today's Date: 04/11/2023  END OF SESSION:  PT End of Session - 04/11/23 1459     Visit Number 8    Number of Visits 17    Date for PT Re-Evaluation 05/29/23    Authorization Type MEDICARE PART B reporting period from 03/06/23    Progress Note Due on Visit 10    PT Start Time 1333    PT Stop Time 1435    PT Time Calculation (min) 62 min    Activity Tolerance Patient tolerated treatment well    Behavior During Therapy [facility] for tasks assessed/performed                 Past Medical History:  Diagnosis Date   Arthritis    Hepatitis    c 2000   Hypertension    Subarachnoid hemorrhage (HCC) 2017   Wears hearing aid in both ears    Past Surgical History:  Procedure Laterality Date   BRAIN SURGERY     [facility] COILING 2017   HERNIA REPAIR  1963   double hernia repair   JOINT REPLACEMENT Bilateral    TKR  L-2012, R-2015   SHOULDER ARTHROSCOPY WITH ROTATOR CUFF REPAIR AND OPEN BICEPS TENODESIS Left 09/08/2018   Procedure: LEFT SHOULDER ARTHROSCOPY,SUBSACAPULARIS REPAIR, SUBACROMIAL DECOMP, DISTAL CLAVICLE EXCISION,BICEP TENODESIS, MINI OPEN REGENTEN PATCH APPLICATION;  Surgeon: Signa Kell, MD;  Location: [facility] ORS;  Service: Orthopedics;  Laterality: Left;   Patient Active Problem List   Diagnosis Date Noted   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   Closed fracture of rib of left side with routine healing 09/04/2022   Chronic pain of left heel 08/31/2021   Arthropathy of left wrist 10/12/2020   Viral hepatitis C 11/02/2019   Lumbar facet arthropathy 04/27/2019   Lumbar degenerative disc disease 04/27/2019   Localized osteoarthritis of shoulder regions, bilateral 04/27/2019   Encounter for long-term opiate analgesic use 04/27/2019   Chronic SI joint pain 04/27/2019   Chronic pain syndrome 04/27/2019   Pain in joint of left shoulder 06/03/2018   Abnormal gait  06/18/2017   Anatomical narrow angle 06/18/2017   Derangement of posterior horn of medial meniscus 06/18/2017   Edema 06/18/2017   Hip pain 06/18/2017   History of total knee arthroplasty 06/18/2017   Localized, primary osteoarthritis 06/18/2017   Muscle weakness 06/18/2017   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   Acute bursitis of right shoulder 06/14/2017   Nonallopathic lesion of sacral region 06/14/2017   Nonallopathic lesion of thoracic region 06/14/2017   Nonallopathic lesion of lumbosacral region 06/14/2017   Biomechanical lesion 06/14/2017   Chronic bilateral low back pain with bilateral sciatica 05/23/2017   Arthritis 04/19/2013   Cataract nuclear 06/16/2012   Glaucoma suspect of both eyes 06/16/2012    PCP: Enid Baas, MD  REFERRING PROVIDER: Edward Jolly, MD  REFERRING DIAG: myofascial pain syndrome of thoracic spine  Rationale for Evaluation and Treatment: Rehabilitation  THERAPY DIAG:  Pain in thoracic spine  Muscle weakness (generalized)  ONSET DATE: October 2024 (approximately 3 months after rib fracture in July 2024)  SUBJECTIVE:  PERTINENT HISTORY:  Patient is a 68 y.o. male who presents to outpatient physical therapy with a referral for medical diagnosis of myofascial pain syndrome of thoracic spine. This patient's chief complaints consist of chronic right sided thoracic spine pain, right shoulder pain, and occasional paresthesia to the right UE, leading to the following functional deficits: limits him with everyday activities/tasks to some amount, working on projects, working on the computer, walking for exercise, sleeping, doing as much as he wants to.  Relevant past medical history and comorbidities include bilateral shoulder surgery (RTC repair, R most recent  12/2020) and subarachnoid hemorrhage in 2017. Patient denies hx of cancer, seizures, unexplained weight loss, unexplained changes in bowel or bladder problems, unexplained stumbling or dropping things, back surgery.    SUBJECTIVE STATEMENT: Feeling alright today  Right "knot" in right scapula is getting back to 4-5/10 pain level Did 45 min of stretches this morning  Went for a walk this morning as well  "Cramp" sensation going on today in left low back after walk but that is normal for him  No pain in the right shoulder today  Did stretches on the ball today and the rib pain "spoke up a bit" but not bad  Bilateral ribs are tender   Exercise-related information: (from 04/09/23) Overall goal: wants a training program/plan to help him get stronger and more flexible to support his pain reduction goals. Wants to know specifically what to do each session.  Available equipment:  Adjustable plate dumbbells (wide range of weight) Barbell + weights to load it to 400# Bench (adjustable so can do inclined) Preacher curl bench Curl bar (crooked not loopy kind) Inversion chair (likes this) Aerobic exercise: Walking in the woods with his 70# dog Patient's goal: 1 hour daily Recent success: 1 hour 7 out of 14 days Known barriers: cannot walk when he takes a pain pill due to safety concerns, so not able to perform if he is too painful.  Strength training:  Patient's goal: 1 in the mornings 7 days a week (with one day per week likely missed).  Recent success: 15-45 min more days than not (more often 15 min) Known barriers: boredom Other info: pt states he "has all day" when asked how much time he has to exercise.   PAIN: Are you having pain? Yes NPRS: 4-5/10 near right scapula, 0/10 in right shoulder (posterior acromion near joint line), tenderness to bilateral ribs      FUNCTIONAL LIMITATIONS: limits him with everyday activities and tasks, working on projects, working on the computer, walking for  exercise, sleeping, doing as much as he wants to  [employer]: working in the shop, building and fixing things  PRECAUTIONS: None   WEIGHT BEARING RESTRICTIONS: No  FALLS:  Has patient fallen in last 6 months? Yes. Number of falls 2 and both falls occurred during the summer of 2024. One fall occurred in the woods where he was pulled over by his dog who was running. The second fall occurred in July 2024 while sleep walking and stumbling out of bed. This fall resulted in rib fracture on the left side.    PLOF: Independent  PATIENT GOALS: to get rid of the pain, to get on an exercise and stretching regimen for his overall health, sleep with less difficulty from pain   NEXT MD VISIT:   OBJECTIVE PATIENT SPECIFIC FUNCTIONAL SCALE (last measured 03/13/23)  1. Exercise: 4  2. Building/Using Tools: 5  3. Walking: 6  Average: 5  1RM TESTING Bent  Over Row with contralateral UE braced (last tested 03/27/2023): Trial 1: 35# R: 1x10  L: 1x10   Trial 2: 65#   R: 1x5 (pt unable to do anymore)   L: 1x7 (pt unable to do anymore)  1 RM for Right Arm: 75.8 (~50% = 35#) 1 RM for Left Arm: 80.2 (~50% = 40#)   Bent Over Shoulder Scaption ("Y"):  (last tested 03/27/2023): Trial 1: 5# DB   R: 1x10    L: 1x10   Trial 2: 7# DB   R: 1x8 (pt unable to do anymore)   L: 1x9 (pt unable to do anymore)  1 RM for Right Arm: 8.9 (~50% = 4#) 1 RM for Left Arm: 9.1 (~50% = 4#)   TREATMENT                                                                                                                           Therapeutic exercise: to centralize symptoms and improve ROM, strength, muscular endurance, and activity tolerance required for successful completion of functional activities.      Quadruped Thread the Needle (upper thoracic rotation)  1x10 each side     Prone Y, T, A, Row at 90 degrees of shoulder ABD + external rotation on Clear Theraball   1x10 each motion (no weight)   Education on HEP  including handout    Therapeutic activities: dynamic therapeutic activities incorporating MULTIPLE parameters or areas of the body designed to achieve improved functional performance.       Squat - Bicep Curl - Overhead Press  1x10 with 5# DB  Walking Forward Lunges with Trunk Rotation   1x2 with 5# DB    Discontinued due to weight being too heavy  1x10 (each leg) with 3# DB  Standing "Chops"/Diagonals with Theraband (holding band with both hands)   High to low - 1x10 each side (Blue Theraband)   Low to high - 1x10 each side (Green Theraband)    Serratus Push Ups in forearm plank position (knees bent)   1x10   Waiter's Carry with shoulder abduction to 90 degrees and elbow flexion to 90 degrees with 5# DB    1x45 seconds each arm    Deadlift with 23# bar to two green chairs    1x10  Cramping sensation in left low back went away after 3 reps, then came back at rep 6-7.    1x5    Continued cramping sensation in left low back.    Education on HEP including handout  Pt required multimodal cuing for proper technique and to facilitate improved neuromuscular control, strength, range of motion, and functional ability resulting in improved performance and form.   PATIENT EDUCATION:  Education details: Exercise purpose and form. Education on HEP including handout.  Person educated: Patient Education method: Explanation, Demonstration, Tactile Cues, Verbal Cues, and Handouts.  Education comprehension: verbalized understanding and needs further education  HOME EXERCISE PROGRAM: Access Code: [email or web address] URL: [email or web address] Date: 03/27/2023 Prepared  by: Siyon Linck [place]  Exercises - Dumbbell Squat at Shoulders  - 1 x daily - 2-3 x weekly - 2 sets - 20 reps - Seated Thoracic Lumbar Extension  - 1 x daily - 1 sets - 20 reps - 5 seconds hold - Standing Row with Anchored Resistance  - 3 x weekly - 2 sets - 20 reps - Resistance Pulldown with March  - 3 x weekly - 2 sets -  20 reps - Curator  - 3 x weekly - 2 sets - 1 min each arm carry time - Sitting Chest Press With Kettlebell  - 3 x weekly - 2 sets - 15 reps - Quadruped Cat Cow  - 1 x daily - 2 sets - 10 reps - Child's Pose with Sidebending  - 1 x daily - 2 sets - 30 hold - Quadruped Rock Back into [employer] Up  - 1 x daily - 1 sets - 10 reps - Prone Press Up  - 1 x daily - 2 sets - 10 reps - Standing Bent Over Single Arm Scapular Row with Table Support with PLB  - 3 x weekly - 2 sets - 15-20 reps - Single Arm Bent Over Shoulder Scaption with Dumbbell  - 3 x weekly - 2 sets - 15-20 reps  HOME EXERCISE PROGRAM [[email or web address]] View at "[email or web address]" using code: [email or web address] Prayer Stretch -  Repeat 10 Repetitions, Hold 2 Seconds, Complete 2 Sets, Perform 3 Times a Day  6 Day Strength-Mobility Exercise Program  Determining how much weight to use with exercise:  If the goal of the exercise is strength and hypertrophy (muscle growth), choose a heavier weight that is very challenging by the end of 8-12 reps.   If the goal of the exercise is muscle endurance, choose a lighter weight that feels very challenging after 10-15 reps (up to 20 reps).  If you can do over 20-30 reps with a weight, you likely aren't challenging the muscles enough to make strength/endurance gains.   Repetition recommendations apply to reps that you can do with proper form. For example, if your form fails after 2-3 reps, only do 2-3 reps until you can do more with good form or reduce the weight you are using if possible.     Day 1  Circuits Exercise Sets Reps Rest Interval   Warm Up   Aerobic Warm Up: Walking   Quadruped Rock Back into [employer] Up 2 10    Cat/Cow 2 10    Seated Thoracic / Lumbar Extensions 1 20 (5 second hold)    Main Workout   A1 Squat with dumbbells at shoulders 2 15 1  min  A2 Chest fly with resistance band  (anchored behind you) 2 15-20 1 min  1-2-minute rest following completion of  circuit A  B1 Deadlifts with barbell 2 10-15 1 min  B2 Walking forward lunge with trunk rotation (holding dumbbell) 2 10  (each leg) 1 min  1-2-minute rest following completion of circuit B      Day 2  Circuits Exercise Sets Reps Rest Interval   Warm Up   Aerobic Warm Up: Walking   Crossbody Stretch holding onto railing 3 30 second hold  (each side)    Seated [employer] Rollouts 2 10    Supine Snowangels on Exercise [place]  2 10    Main Workout   A1 Prone Y, T, A, Field Goals on Exercise Ball 2 10-15 (each motion) 1 min  A2 Comptroller  with dumbbell in one hand 2 1 min (each arm) 1 min  1-2-minute rest following completion of circuit A  B1 Kneeling lunge with trunk rotation against resistance band 2 15 1  min  B2 Sustained resistance band pull down with standing marches 2 20  (each leg) 1 min  1-2-minute rest following completion of circuit B  Day 3  Circuits Exercise Sets Reps Rest Interval   Warm Up   Aerobic Warm Up: Walking   Modified Prayer Stretch 3 30 second hold    Child's Pose with Side Bending 2 30 second hold  (each way)    Quadruped Rock Back into [employer] Up 2 10    Main Workout   A1 Bent over single arm row  (heavy weight) 3 8-10 2 min  A2 Standing I, Y, T, A with resistance band  (facing anchor) 2 10-15 1 min  1-2-minute rest following completion of circuit A  B1 Squat - Bicep Curl - Overhead Press  with dumbbells  2 10-15 1 min  B2 Standing row with anchored resistance 2 20 1  min  1-2-minute rest following completion of circuit B   Day 4  Circuits Exercise Sets Reps Rest Interval   Warm Up   Aerobic Warm Up: Walking   Seated Thoracic / Lumbar Extensions 1 20 (5 second hold)    Cat/Cow 2 10    Quadruped Thread the Needle (upper thoracic rotation) 2 10    Main Workout   A1 Chest Press on bench with dumbbells 2 15 1  min  A2 Squats with dumbbells on shoulders 2 10-15 1 min  1-2-minute rest following completion of circuit A  B1  Serratus Push Ups on forearms  (just move shoulder blades) 2 15-20 1 min  B2 Standing "Chops" with resistance band  (high-low & low-high) 2 15-20 (each direction) 1 min  1-2-minute rest following completion of circuit B    Day 5  Circuits Exercise Sets Reps Rest Interval   Warm Up   Aerobic Warm Up: Walking   Modified Prayer Stretch 3 30 second hold    Seated 3-Way Ball Rollouts 2 10    Supine Snowangels on Exercise Ball  2 10    Main Workout   A1 Squat - Bicep Curl - Overhead Press  with dumbbells  2 10-15 1 min  A2 Bent over single arm row  (heavy weight) 3 8-10 2 min  1-2-minute rest following completion of circuit A  B1 Deadlifts with barbell (heavy weight) 3 8-10 2 min  B2 Prone Y, T, A, Field Goals on Exercise Ball 2 10-15 (each motion) 1 min  1-2-minute rest following completion of circuit B     Day 6  Circuits Exercise Sets Reps Rest Interval   Warm Up   Aerobic Warm Up: Walking   Crossbody Stretch holding onto railing 3 30 second hold  (each side)    Seated 3-Way Ball Rollouts 2 10    Quadruped Thread the Needle (upper thoracic rotation) 2 10    Main Workout    ASSESSMENT:  CLINICAL IMPRESSION: Patient arrives reporting his right scapular region pain has returned to its typical 4-5/10 pain level. The focus of today's session was to go through new home exercise/training program designed for global strength and mobility to assess patient tolerance and provide education on exercise purpose/form. Patient provided multimodal cuing for exercises he has implemented at home to improve form/technique. Patient provided handout including education on therapeutic exercise dosing for hypertrophy vs. strength  vs. endurance goals and how to select proper weight at home. Patient provided updated HEP/handout to for a 6-day exercise program focusing on UE/periscapular/trunk/LE strength, endurance, and mobility of the spine. Patient advised to hold off on dead lift exercise  at home until form/technique can be corrected at the clinic to ensure there is not a negative response to the exercise. Patient reports improved pain in his right scapular region and slightly worse pain in his left low back at end of session. Patient would benefit from continued management of limiting condition by skilled physical therapist to address remaining impairments and functional limitations to work towards stated goals and return to PLOF or maximal functional independence.   OBJECTIVE IMPAIRMENTS: decreased activity tolerance, decreased endurance, decreased knowledge of condition, decreased mobility, difficulty walking, decreased ROM, decreased strength, hypomobility, impaired perceived functional ability, increased muscle spasms, impaired flexibility, impaired UE functional use, postural dysfunction, and pain.   ACTIVITY LIMITATIONS: carrying, lifting, bending, sitting, standing, squatting, sleeping, transfers, bed mobility, bathing, dressing, hygiene/grooming, locomotion level, and caring for others  PARTICIPATION LIMITATIONS: meal prep, cleaning, laundry, interpersonal relationship, driving, shopping, community activity, yard work, and everyday activities and tasks, working on projects, working on [employer], walking for exercise, sleeping, doing as much as he wants to   PERSONAL FACTORS: Age, Past/current experiences, Time since onset of injury/illness/exacerbation, and 3+ comorbidities: bilateral shoulder surgery (RTC repair, R most recent 12/2020), bilateral TKA, subarachnoid hemorrhage 2017 with SAH coiling, HTN, hx of hepatitis C (cured), double hernia repair, plantar facial fibromatosis, chronic pain syndrome, former smoker, Chronic bilateral low back pain with bilateral sciatica; Acute bursitis of right shoulder; Lumbar facet arthropathy; Lumbar degenerative disc disease; Localized osteoarthritis of shoulder regions, bilateral; Chronic SI joint pain; Chronic pain syndrome; Pain in joint  of left shoulder; Abnormal gait; Anatomical narrow angle; Biomechanical lesion; Cataract nuclear; Derangement of posterior horn of medial meniscus; Edema; Glaucoma suspect of both eyes; Hip pain; History of total knee arthroplasty; Localized, primary osteoarthritis; Hx of subarachnoid hemophage, Muscle weakness; Arthritis; Plantar fascial fibromatosis; Viral hepatitis C; Arthropathy of left wrist; Chronic pain of left heel; Intercostal neuralgia; and Closed fracture of rib of left side with routine healing,  has a past medical history of Arthritis, Hepatitis, Hypertension, Subarachnoid hemorrhage (HCC) (2017), and Wears hearing aid in both ears.  has a past surgical history that includes Brain surgery; Joint replacement (Bilateral); Shoulder arthroscopy with rotator cuff repair and open biceps tenodesis (Left, 09/08/2018); and Hernia repair (1963). Also had surgery on right shoulder are also affecting patient's functional outcome.   REHAB POTENTIAL: Good  CLINICAL DECISION MAKING: Evolving/moderate complexity  EVALUATION COMPLEXITY: Moderate   GOALS: Goals reviewed with patient? No  SHORT TERM GOALS: Target date: 03/20/2023  Patient will be independent with initial home exercise program for self-management of symptoms. Baseline: Initial HEP to be provided at visit 2 as appropriate (03/06/23); Goal status: In progress   LONG TERM GOALS: Target date: 05/29/2023  Patient will be independent with a long-term home exercise program for self-management of symptoms.  Baseline: Initial HEP to be provided at visit 2 as appropriate (03/06/23); Goal status: In progress  2.  Patient will demonstrate improved FOTO to equal or greater than 56 by visit #13 to demonstrate improvement in overall condition and self-reported functional ability.  Baseline: 48/100 (03/06/23); Goal status: In progress  3.  Patient will demonstrate full thoracic AROM without provocation of thoracic spine pain and tightness to  improve his ability to complete tasks such as walking, sweeping, and  washing dishes with less difficulty. Baseline: Limitations with extension/flexion and concordant pulling sensation with lateral flexion (03/06/23); Goal status: In progress  4.  Patient will demonstrate full cervical AROM without provocation of UT pain and thoracic spine tightness to improve ability to complete ADLs with less difficulty.  Baseline: Limited flexion and left side bending causing UT pain (03/06/23); Goal status: In progress  5.  Patient will demonstrate improvement in Patient Specific Functional Scale (PSFS) of equal or greater than 3 points to reflect clinically significant improvement in patient's most valued functional activities. Baseline: To be tested at visit 2 as appropriate (03/06/23); 5/10 at visit #2 (03/13/2023);  Goal status: In progress   PLAN:  PT FREQUENCY: 1-2x/week  PT DURATION: 8-12 weeks  PLANNED INTERVENTIONS: 97164- PT Re-evaluation, 97110-Therapeutic exercises, 97530- Therapeutic activity, 97112- Neuromuscular re-education, 97535- Self Care, 16109- Manual therapy, 97014- Electrical stimulation (unattended), Patient/Family education, Dry Needling, Joint mobilization, Spinal mobilization, Cryotherapy, and Moist heat.  PLAN FOR NEXT SESSION: update HEP as appropriate, progressive postural/UE/functional strengthening, core strengthening, manual therapy as needed, education.   Storey Stangeland [place], SPT [employer] DPTE   Huntley Dec R. Ilsa Iha, PT, DPT 04/11/23, 3:12 PM  [facility] [address] [place], [place] [address] P: [phone] I F: [phone]

## 2023-04-11 NOTE — Patient Instructions (Signed)
6 Day Strength-Mobility Exercise Program  Determining how much weight to use with exercise:  If the goal of the exercise is strength and hypertrophy (muscle growth), choose a heavier weight that is very challenging by the end of 8-12 reps.   If the goal of the exercise is muscle endurance, choose a lighter weight that feels very challenging after 10-15 reps (up to 20 reps).  If you can do over 20-30 reps with a weight, you likely aren't challenging the muscles enough to make strength/endurance gains.   Repetition recommendations apply to reps that you can do with proper form. For example, if your form fails after 2-3 reps, only do 2-3 reps until you can do more with good form or reduce the weight you are using if possible.     Day 1  Circuits Exercise Sets Reps Rest Interval   Warm Up   Aerobic Warm Up: Walking   Quadruped Rock Back into [employer] Up 2 10    Cat/Cow 2 10    Seated Thoracic / Lumbar Extensions 1 20 (5 second hold)    Main Workout   A1 Squat with dumbbells at shoulders 2 15 1  min  A2 Chest fly with resistance band  (anchored behind you) 2 15-20 1 min  1-2-minute rest following completion of circuit A  B1 Deadlifts with barbell 2 10-15 1 min  B2 Walking forward lunge with trunk rotation (holding dumbbell) 2 10  (each leg) 1 min  1-2-minute rest following completion of circuit B      Day 2  Circuits Exercise Sets Reps Rest Interval   Warm Up   Aerobic Warm Up: Walking   Orthoptist onto railing 3 30 second hold  (each side)    Seated [employer] Rollouts 2 10    Supine Snowangels on Exercise [place]  2 10    Main Workout   A1 Prone Y, T, A, Field Goals on Exercise Ball 2 10-15 (each motion) 1 min  A2 Suitcase Carry with dumbbell in one hand 2 1 min (each arm) 1 min  1-2-minute rest following completion of circuit A  B1 Kneeling lunge with trunk rotation against resistance band 2 15 1  min  B2 Sustained resistance band pull down with  standing marches 2 20  (each leg) 1 min  1-2-minute rest following completion of circuit B  Day 3  Circuits Exercise Sets Reps Rest Interval   Warm Up   Aerobic Warm Up: Walking   Modified Prayer Stretch 3 30 second hold    Child's Pose with Side Bending 2 30 second hold  (each way)    Quadruped Rock Back into [employer] Up 2 10    Main Workout   A1 Bent over single arm row  (heavy weight) 3 8-10 2 min  A2 Standing I, Y, T, A with resistance band  (facing anchor) 2 10-15 1 min  1-2-minute rest following completion of circuit A  B1 Squat - Bicep Curl - Overhead Press  with dumbbells  2 10-15 1 min  B2 Standing row with anchored resistance 2 20 1  min  1-2-minute rest following completion of circuit B   Day 4  Circuits Exercise Sets Reps Rest Interval   Warm Up   Aerobic Warm Up: Walking   Seated Thoracic / Lumbar Extensions 1 20 (5 second hold)    Cat/Cow 2 10    Quadruped Thread the Needle (upper thoracic rotation) 2 10    Main Workout  A1 Chest Press on bench with dumbbells 2 15 1  min  A2 Squats with dumbbells on shoulders 2 10-15 1 min  1-2-minute rest following completion of circuit A  B1 Serratus Push Ups on forearms  (just move shoulder blades) 2 15-20 1 min  B2 Standing "Chops" with resistance band  (high-low & low-high) 2 15-20 (each direction) 1 min  1-2-minute rest following completion of circuit B    Day 5  Circuits Exercise Sets Reps Rest Interval   Warm Up   Aerobic Warm Up: Walking   Modified Prayer Stretch 3 30 second hold    Seated 3-Way Ball Rollouts 2 10    Supine Snowangels on Exercise Ball  2 10    Main Workout   A1 Squat - Bicep Curl - Overhead Press  with dumbbells  2 10-15 1 min  A2 Bent over single arm row  (heavy weight) 3 8-10 2 min  1-2-minute rest following completion of circuit A  B1 Deadlifts with barbell (heavy weight) 3 8-10 2 min  B2 Prone Y, T, A, Field Goals on Exercise Ball 2 10-15 (each motion) 1 min   1-2-minute rest following completion of circuit B     Day 6  Circuits Exercise Sets Reps Rest Interval   Warm Up   Aerobic Warm Up: Walking   Crossbody Stretch holding onto railing 3 30 second hold  (each side)    Seated 3-Way Ball Rollouts 2 10    Quadruped Thread the Needle (upper thoracic rotation) 2 10    Main Workout   A1 Squat with dumbbells at shoulders 2 15 1  min  A2 Kneeling lunge with trunk rotation against resistance band 2 15 1  min  1-2-minute rest following completion of circuit A  B1 Waiter's Carry with kettlebell (bottom side up) or dumbbell in one hand 2 1 min (each arm) 1 min  B2 Standing row with anchored resistance 2 20 1  min  1-2-minute rest following completion of circuit B

## 2023-04-12 ENCOUNTER — Encounter: Payer: Self-pay | Admitting: Podiatry

## 2023-04-12 ENCOUNTER — Other Ambulatory Visit
Admission: RE | Admit: 2023-04-12 | Discharge: 2023-04-12 | Disposition: A | Discharge status: Home / Self Care | Payer: Self-pay | Source: Ambulatory Visit | Attending: Medical Genetics | Admitting: Medical Genetics

## 2023-04-12 ENCOUNTER — Ambulatory Visit (INDEPENDENT_AMBULATORY_CARE_PROVIDER_SITE_OTHER): Payer: Medicare Other | Admitting: Podiatry

## 2023-04-12 DIAGNOSIS — M722 Plantar fascial fibromatosis: Secondary | ICD-10-CM

## 2023-04-12 NOTE — Progress Notes (Signed)
     Chief Complaint  Patient presents with   Plantar Fasciitis    "They're not too bad but the heels are starting to pain up.  So, I want to get a shot."    Subjective: 68 y.o. male presenting today for f/u evaluation of bilateral foot pain. Pt states injections help significantly in the past and he has significant relief for a long period of time.  He was last seen in the office about 8 months ago.   Past Medical History:  Diagnosis Date   Arthritis    Hepatitis    c 2000   Hypertension    Subarachnoid hemorrhage (HCC) 2017   Wears hearing aid in both ears    Past Surgical History:  Procedure Laterality Date   BRAIN SURGERY     [facility] COILING 2017   HERNIA REPAIR  1963   double hernia repair   JOINT REPLACEMENT Bilateral    TKR  L-2012, R-2015   SHOULDER ARTHROSCOPY WITH ROTATOR CUFF REPAIR AND OPEN BICEPS TENODESIS Left 09/08/2018   Procedure: LEFT SHOULDER ARTHROSCOPY,SUBSACAPULARIS REPAIR, SUBACROMIAL DECOMP, DISTAL CLAVICLE EXCISION,BICEP TENODESIS, MINI OPEN REGENTEN PATCH APPLICATION;  Surgeon: Signa Kell, MD;  Location: [facility] ORS;  Service: Orthopedics;  Laterality: Left;   Allergies  Allergen Reactions   Sulfa Antibiotics Hives   Objective: Physical Exam General: The patient is alert and oriented x3 in no acute distress.  Dermatology: Skin is warm, dry and supple bilateral lower extremities. Negative for open lesions or macerations bilateral.   Vascular: Dorsalis Pedis and Posterior Tibial pulses palpable bilateral.  Capillary fill time is immediate to all digits.  Neurological: Grossly intact via light touch  Musculoskeletal: Tenderness to palpation to the plantar aspect of the bilateral heels along the plantar fascia.  Today there is minimal tenderness with palpation along the peroneal tendon of the right lower extremity.  Radiographic exam RT foot 04/16/2022: Normal osseous mineralization.  Degenerative changes noted with varus particular spurring  throughout the foot.  No acute fractures identified  Assessment: 1. Plantar fasciitis bilateral 2.  History of peroneal tendinitis right; currently asymptomatic  Plan of Care:  - Patient evaluated.  - Injection of 0.5cc Celestone soluspan injected into the plantar fascia bilateral -Continue meloxicam 15 mg daily as needed - Patient is currently on pain management and taking prescribed hydrocodone.   - Return to clinic as needed.    Felecia Shelling, DPM [facility]  Dr. Felecia Shelling, DPM    [address] [place], [place] [address]                Office [phone]  Fax [phone]

## 2023-04-15 DIAGNOSIS — M722 Plantar fascial fibromatosis: Secondary | ICD-10-CM | POA: Diagnosis not present

## 2023-04-15 MED ORDER — BETAMETHASONE SOD PHOS & ACET 6 (3-3) MG/ML IJ SUSP
3.0000 mg | Freq: Once | INTRAMUSCULAR | Status: AC
  Administered 2023-04-15: 3 mg via INTRA_ARTICULAR

## 2023-04-16 ENCOUNTER — Ambulatory Visit: Payer: Medicare Other | Admitting: Physical Therapy

## 2023-04-16 ENCOUNTER — Encounter: Payer: Self-pay | Admitting: Physical Therapy

## 2023-04-16 DIAGNOSIS — M546 Pain in thoracic spine: Secondary | ICD-10-CM | POA: Diagnosis not present

## 2023-04-16 DIAGNOSIS — M6281 Muscle weakness (generalized): Secondary | ICD-10-CM

## 2023-04-16 NOTE — Therapy (Signed)
 OUTPATIENT PHYSICAL THERAPY TREATMENT   Patient Name: ORESTE MAJEED MRN: [id number] DOB:1955-10-27, 68 y.o., male Today's Date: 04/16/2023  END OF SESSION:  PT End of Session - 04/16/23 1254     Visit Number 9    Number of Visits 17    Date for PT Re-Evaluation 05/29/23    Authorization Type MEDICARE PART B reporting period from 03/06/23    Progress Note Due on Visit 10    PT Start Time 1123    PT Stop Time 1206    PT Time Calculation (min) 43 min    Activity Tolerance Patient tolerated treatment well    Behavior During Therapy [facility] for tasks assessed/performed               Past Medical History:  Diagnosis Date   Arthritis    Hepatitis    c 2000   Hypertension    Subarachnoid hemorrhage (HCC) 2017   Wears hearing aid in both ears    Past Surgical History:  Procedure Laterality Date   BRAIN SURGERY     [facility] 2017   HERNIA REPAIR  1963   double hernia repair   JOINT REPLACEMENT Bilateral    TKR  L-2012, R-2015   SHOULDER ARTHROSCOPY WITH ROTATOR CUFF REPAIR AND OPEN BICEPS TENODESIS Left 09/08/2018   Procedure: LEFT SHOULDER ARTHROSCOPY,SUBSACAPULARIS REPAIR, SUBACROMIAL DECOMP, DISTAL CLAVICLE EXCISION,BICEP TENODESIS, MINI OPEN REGENTEN PATCH APPLICATION;  Surgeon: Signa Kell, MD;  Location: [facility] ORS;  Service: Orthopedics;  Laterality: Left;   Patient Active Problem List   Diagnosis Date Noted   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   Closed fracture of rib of left side with routine healing 09/04/2022   Chronic pain of left heel 08/31/2021   Arthropathy of left wrist 10/12/2020   Viral hepatitis C 11/02/2019   Lumbar facet arthropathy 04/27/2019   Lumbar degenerative disc disease 04/27/2019   Localized osteoarthritis of shoulder regions, bilateral 04/27/2019   Encounter for long-term opiate analgesic use 04/27/2019   Chronic SI joint pain 04/27/2019   Chronic pain syndrome 04/27/2019   Pain in joint of left shoulder 06/03/2018   Abnormal gait  06/18/2017   Anatomical narrow angle 06/18/2017   Derangement of posterior horn of medial meniscus 06/18/2017   Edema 06/18/2017   Hip pain 06/18/2017   History of total knee arthroplasty 06/18/2017   Localized, primary osteoarthritis 06/18/2017   Muscle weakness 06/18/2017   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   Acute bursitis of right shoulder 06/14/2017   Nonallopathic lesion of sacral region 06/14/2017   Nonallopathic lesion of thoracic region 06/14/2017   Nonallopathic lesion of lumbosacral region 06/14/2017   Biomechanical lesion 06/14/2017   Chronic bilateral low back pain with bilateral sciatica 05/23/2017   Arthritis 04/19/2013   Cataract nuclear 06/16/2012   Glaucoma suspect of both eyes 06/16/2012    PCP: Enid Baas, MD  REFERRING PROVIDER: Edward Jolly, MD  REFERRING DIAG: myofascial pain syndrome of thoracic spine  Rationale for Evaluation and Treatment: Rehabilitation  THERAPY DIAG:  Pain in thoracic spine  Muscle weakness (generalized)  ONSET DATE: October 2024 (approximately 3 months after rib fracture in July 2024)  SUBJECTIVE:  PERTINENT HISTORY:  Patient is a 68 y.o. male who presents to outpatient physical therapy with a referral for medical diagnosis of myofascial pain syndrome of thoracic spine. This patient's chief complaints consist of chronic right sided thoracic spine pain, right shoulder pain, and occasional paresthesia to the right UE, leading to the following functional deficits: limits him with everyday activities/tasks to some amount, working on projects, working on the computer, walking for exercise, sleeping, doing as much as he wants to.  Relevant past medical history and comorbidities include bilateral shoulder surgery (RTC repair, R most recent  12/2020) and subarachnoid hemorrhage in 2017. Patient denies hx of cancer, seizures, unexplained weight loss, unexplained changes in bowel or bladder problems, unexplained stumbling or dropping things, back surgery.    SUBJECTIVE STATEMENT: Feeling bad today  Neck is very bothersome today  Pain goes from mid back (about shoulder blade height up to base of his head) - the muscles on both sides of his spine but not along the spine itself Pain started yesterday afternoon  Can't think of anything in particular that caused pain - it just started randomly  Feels muscle related - very tight  Didn't do exercises today because of PT but has done them each day since last session  HEP program has been going well - all exercises are tolerable  Got injections in heels - feet are feeling a little bit better    Exercise-related information: (from 04/09/23) Overall goal: wants a training program/plan to help him get stronger and more flexible to support his pain reduction goals. Wants to know specifically what to do each session.  Available equipment:  Adjustable plate dumbbells (wide range of weight) Barbell + weights to load it to 400# Bench (adjustable so can do inclined) Preacher curl bench Curl bar (crooked not loopy kind) Inversion chair (likes this) Aerobic exercise: Walking in the woods with his 70# dog Patient's goal: 1 hour daily Recent success: 1 hour 7 out of 14 days Known barriers: cannot walk when he takes a pain pill due to safety concerns, so not able to perform if he is too painful.  Strength training:  Patient's goal: 1 in the mornings 7 days a week (with one day per week likely missed).  Recent success: 15-45 min more days than not (more often 15 min) Known barriers: boredom Other info: pt states he "has all day" when asked how much time he has to exercise.   PAIN: Are you having pain? Yes NPRS: bilateral neck pain: 7/10; 4/10 near right scapula, 1/10 in right shoulder  (posterior acromion near joint line), continued tenderness to bilateral ribs, but less noticeable    FUNCTIONAL LIMITATIONS: limits him with everyday activities and tasks, working on projects, working on the computer, walking for exercise, sleeping, doing as much as he wants to  [employer]: working in the shop, building and fixing things  PRECAUTIONS: None   WEIGHT BEARING RESTRICTIONS: No  FALLS:  Has patient fallen in last 6 months? Yes. Number of falls 2 and both falls occurred during the summer of 2024. One fall occurred in the woods where he was pulled over by his dog who was running. The second fall occurred in July 2024 while sleep walking and stumbling out of bed. This fall resulted in rib fracture on the left side.    PLOF: Independent  PATIENT GOALS: to get rid of the pain, to get on an exercise and stretching regimen for his overall health, sleep with less difficulty from pain  NEXT MD VISIT:   OBJECTIVE PATIENT SPECIFIC FUNCTIONAL SCALE (last measured 03/13/23)  1. Exercise: 4  2. Building/Using Tools: 5  3. Walking: 6  Average: 5  1RM TESTING Bent Over Row with contralateral UE braced (last tested 03/27/2023): Trial 1: 35# R: 1x10  L: 1x10   Trial 2: 65#   R: 1x5 (pt unable to do anymore)   L: 1x7 (pt unable to do anymore)  1 RM for Right Arm: 75.8 (~50% = 35#) 1 RM for Left Arm: 80.2 (~50% = 40#)   Bent Over Shoulder Scaption ("Y"):  (last tested 03/27/2023): Trial 1: 5# DB   R: 1x10    L: 1x10   Trial 2: 7# DB   R: 1x8 (pt unable to do anymore)   L: 1x9 (pt unable to do anymore)  1 RM for Right Arm: 8.9 (~50% = 4#) 1 RM for Left Arm: 9.1 (~50% = 4#)   TREATMENT                                                                                                                           Therapeutic exercise: to centralize symptoms and improve ROM, strength, muscular endurance, and activity tolerance required for successful completion of functional  activities.      Quadruped Thread the Needle (upper thoracic rotation)  1x10 each side     Cat/Cow  1x10   Cervical retraction seated with lumbar roll at lumbar spine            AROM: 1x10 with 2-3 second hold                        No change in pain.               With self OP: 1x10  No change in pain.   Seated Sustained Cervical Extension with small rotations   2x10 (each way) Minimal improvement in symptoms. Returned to starting level of pain when returned to neutral neck position.   Seated Suboccipital Stretch with self-overpressure   5 second hold x 10  Feels good stretch but his lower cervical spine "catches" when he returns to neutral position    5 second hold x 10 (after STM)  Improved pain and range of motion.   Education on HEP including handout (seated suboccipital stretch)   Manual therapy: to reduce pain and tissue tension, improve range of motion, neuromodulation, in order to promote improved ability to complete functional activities.    Seated cervical retraction with clinician overpressure:  1x6  No change in pain   Seated thoracic extension with clinician OP  Patient with hands at neck and elbows under chin, clinician stabilizing at thoracic spine with one hand and lifting elbows towards celiing with the other.  1x6 Improved pain in his thoracic spine but not his cervical spine.  Patient seated towards front edge of chair. Patient leaning back of chair (with towel on it) with  hands behind head and elbows as close together as possible. Clinician pulling elbows towards celing while stabilizing chair with knee to keep it from tipping over.             2x6  Improved pain in his thoracic spine but not his cervical spine.    STM to bilateral suboccipitals, cervical paraspinals, bilateral upper traps  Cervical distraction - 5 second hold x 6    Pt required multimodal cuing for proper technique and to facilitate improved neuromuscular control, strength, range  of motion, and functional ability resulting in improved performance and form.     PATIENT EDUCATION:  Education details: Exercise purpose and form. Education on HEP including handout.  Person educated: Patient Education method: Explanation, Demonstration, Tactile Cues, Verbal Cues, and Handouts.  Education comprehension: verbalized understanding and needs further education  HOME EXERCISE PROGRAM: Access Code: [email or web address] URL: [email or web address] Date: 04/16/2023 Prepared by: Anamarie Hunn [place]  Exercises - Dumbbell Squat at Shoulders  - 1 x daily - 2-3 x weekly - 2 sets - 20 reps - Seated Thoracic Lumbar Extension  - 1 x daily - 1 sets - 20 reps - 5 seconds hold - Standing Row with Anchored Resistance  - 3 x weekly - 2 sets - 20 reps - Resistance Pulldown with March  - 3 x weekly - 2 sets - 20 reps - Curator  - 3 x weekly - 2 sets - 1 min each arm carry time - Sitting Chest Press With Kettlebell  - 3 x weekly - 2 sets - 15 reps - Quadruped Cat Cow  - 1 x daily - 2 sets - 10 reps - Child's Pose with Sidebending  - 1 x daily - 2 sets - 30 hold - Quadruped Rock Back into [employer] Up  - 1 x daily - 1 sets - 10 reps - Prone Press Up  - 1 x daily - 2 sets - 10 reps - Standing Bent Over Single Arm Scapular Row with Table Support with PLB  - 3 x weekly - 2 sets - 15-20 reps - Single Arm Bent Over Shoulder Scaption with Dumbbell  - 3 x weekly - 2 sets - 15-20 reps - Sub-Occipital Cervical Stretch  - 1 x daily - 1 sets - 10 reps - 5 second hold  HOME EXERCISE PROGRAM [[email or web address]] View at [email or web address]" using code: [email or web address] Prayer Stretch -  Repeat 10 Repetitions, Hold 2 Seconds, Complete 2 Sets, Perform 3 Times a Day  6 Day Strength-Mobility Exercise Program  Determining how much weight to use with exercise:  If the goal of the exercise is strength and hypertrophy (muscle growth), choose a heavier weight that is very challenging by the end of 8-12 reps.    If the goal of the exercise is muscle endurance, choose a lighter weight that feels very challenging after 10-15 reps (up to 20 reps).  If you can do over 20-30 reps with a weight, you likely aren't challenging the muscles enough to make strength/endurance gains.   Repetition recommendations apply to reps that you can do with proper form. For example, if your form fails after 2-3 reps, only do 2-3 reps until you can do more with good form or reduce the weight you are using if possible.     Day 1  Circuits Exercise Sets Reps Rest Interval   Warm Up   Aerobic Warm Up: Walking   Quadruped Rock Back into [employer] Up 2 10    Cat/Cow 2  10    Seated Thoracic / Lumbar Extensions 1 20 (5 second hold)    Main Workout   A1 Squat with dumbbells at shoulders 2 15 1  min  A2 Chest fly with resistance band  (anchored behind you) 2 15-20 1 min  1-2-minute rest following completion of circuit A  B1 Deadlifts with barbell 2 10-15 1 min  B2 Walking forward lunge with trunk rotation (holding dumbbell) 2 10  (each leg) 1 min  1-2-minute rest following completion of circuit B      Day 2  Circuits Exercise Sets Reps Rest Interval   Warm Up   Aerobic Warm Up: Walking   Orthoptist onto railing 3 30 second hold  (each side)    Seated [employer] Rollouts 2 10    Supine Snowangels on Exercise [place]  2 10    Main Workout   A1 Prone Y, T, A, Field Goals on Exercise Ball 2 10-15 (each motion) 1 min  A2 Suitcase Carry with dumbbell in one hand 2 1 min (each arm) 1 min  1-2-minute rest following completion of circuit A  B1 Kneeling lunge with trunk rotation against resistance band 2 15 1  min  B2 Sustained resistance band pull down with standing marches 2 20  (each leg) 1 min  1-2-minute rest following completion of circuit B  Day 3  Circuits Exercise Sets Reps Rest Interval   Warm Up   Aerobic Warm Up: Walking   Modified Prayer Stretch 3 30 second hold    Child's Pose  with Side Bending 2 30 second hold  (each way)    Quadruped Rock Back into [employer] Up 2 10    Main Workout   A1 Bent over single arm row  (heavy weight) 3 8-10 2 min  A2 Standing I, Y, T, A with resistance band  (facing anchor) 2 10-15 1 min  1-2-minute rest following completion of circuit A  B1 Squat - Bicep Curl - Overhead Press  with dumbbells  2 10-15 1 min  B2 Standing row with anchored resistance 2 20 1  min  1-2-minute rest following completion of circuit B   Day 4  Circuits Exercise Sets Reps Rest Interval   Warm Up   Aerobic Warm Up: Walking   Seated Thoracic / Lumbar Extensions 1 20 (5 second hold)    Cat/Cow 2 10    Quadruped Thread the Needle (upper thoracic rotation) 2 10    Main Workout   A1 Chest Press on bench with dumbbells 2 15 1  min  A2 Squats with dumbbells on shoulders 2 10-15 1 min  1-2-minute rest following completion of circuit A  B1 Serratus Push Ups on forearms  (just move shoulder blades) 2 15-20 1 min  B2 Standing "Chops" with resistance band  (high-low & low-high) 2 15-20 (each direction) 1 min  1-2-minute rest following completion of circuit B    Day 5  Circuits Exercise Sets Reps Rest Interval   Warm Up   Aerobic Warm Up: Walking   Modified Prayer Stretch 3 30 second hold    Seated 3-Way Ball Rollouts 2 10    Supine Snowangels on Exercise Ball  2 10    Main Workout   A1 Squat - Bicep Curl - Overhead Press  with dumbbells  2 10-15 1 min  A2 Bent over single arm row  (heavy weight) 3 8-10 2 min  1-2-minute rest following completion of circuit A  B1 Deadlifts with barbell (  heavy weight) 3 8-10 2 min  B2 Prone Y, T, A, Field Goals on Exercise Ball 2 10-15 (each motion) 1 min  1-2-minute rest following completion of circuit B     Day 6  Circuits Exercise Sets Reps Rest Interval   Warm Up   Aerobic Warm Up: Walking   Crossbody Stretch holding onto railing 3 30 second hold  (each side)    Seated 3-Way Ball Rollouts 2  10    Quadruped Thread the Needle (upper thoracic rotation) 2 10    Main Workout    ASSESSMENT:  CLINICAL IMPRESSION: Patient arrives to session reporting new pain and stiffness in his cervical spine that radiates from his mid back (from approximately T7) up to the base of his skull bilaterally. The focus of today's session was to increase mobility of the cervical and thoracic spine and reduce pain/tissue tension. Throughout cervical AROM, patient experienced improvement when maintaining cervical flexion and extension, but felt a "catching" sensation when returning to neutral cervical spine position. AROM cervical retractions, cervical retractions with self-overpressure, and cervical retractions with clinical-overpressure resulted in no change in neck pain. Seated thoracic extensions with clinician-over pressure done to improve thoracic range of motion and resulted in improved symptoms in his thoracic spine, but not his cervical spine. STM to bilateral suboccipitals, upper cervical paraspinals, bilateral upper traps, and cervical distraction done to improve pain and tissue tension to the cervical spine. Patient described less pain and improved motion following manual therapy. Patient provided an update HEP to include seated suboccipital stretch with self-overpressure. Patient reports improvement in his neck pain to 5/10 and no change in his right scapula pain at the end of session. Patient would benefit from continued management of limiting condition by skilled physical therapist to address remaining impairments and functional limitations to work towards stated goals and return to PLOF or maximal functional independence.    OBJECTIVE IMPAIRMENTS: decreased activity tolerance, decreased endurance, decreased knowledge of condition, decreased mobility, difficulty walking, decreased ROM, decreased strength, hypomobility, impaired perceived functional ability, increased muscle spasms, impaired flexibility,  impaired UE functional use, postural dysfunction, and pain.   ACTIVITY LIMITATIONS: carrying, lifting, bending, sitting, standing, squatting, sleeping, transfers, bed mobility, bathing, dressing, hygiene/grooming, locomotion level, and caring for others  PARTICIPATION LIMITATIONS: meal prep, cleaning, laundry, interpersonal relationship, driving, shopping, community activity, yard work, and everyday activities and tasks, working on projects, working on [employer], walking for exercise, sleeping, doing as much as he wants to   PERSONAL FACTORS: Age, Past/current experiences, Time since onset of injury/illness/exacerbation, and 3+ comorbidities: bilateral shoulder surgery (RTC repair, R most recent 12/2020), bilateral TKA, subarachnoid hemorrhage 2017 with SAH coiling, HTN, hx of hepatitis C (cured), double hernia repair, plantar facial fibromatosis, chronic pain syndrome, former smoker, Chronic bilateral low back pain with bilateral sciatica; Acute bursitis of right shoulder; Lumbar facet arthropathy; Lumbar degenerative disc disease; Localized osteoarthritis of shoulder regions, bilateral; Chronic SI joint pain; Chronic pain syndrome; Pain in joint of left shoulder; Abnormal gait; Anatomical narrow angle; Biomechanical lesion; Cataract nuclear; Derangement of posterior horn of medial meniscus; Edema; Glaucoma suspect of both eyes; Hip pain; History of total knee arthroplasty; Localized, primary osteoarthritis; Hx of subarachnoid hemophage, Muscle weakness; Arthritis; Plantar fascial fibromatosis; Viral hepatitis C; Arthropathy of left wrist; Chronic pain of left heel; Intercostal neuralgia; and Closed fracture of rib of left side with routine healing,  has a past medical history of Arthritis, Hepatitis, Hypertension, Subarachnoid hemorrhage (HCC) (2017), and Wears hearing aid in both ears.  has a past surgical history that includes Brain surgery; Joint replacement (Bilateral); Shoulder arthroscopy with  rotator cuff repair and open biceps tenodesis (Left, 09/08/2018); and Hernia repair (1963). Also had surgery on right shoulder are also affecting patient's functional outcome.   REHAB POTENTIAL: Good  CLINICAL DECISION MAKING: Evolving/moderate complexity  EVALUATION COMPLEXITY: Moderate   GOALS: Goals reviewed with patient? No  SHORT TERM GOALS: Target date: 03/20/2023  Patient will be independent with initial home exercise program for self-management of symptoms. Baseline: Initial HEP to be provided at visit 2 as appropriate (03/06/23); Goal status: In progress   LONG TERM GOALS: Target date: 05/29/2023  Patient will be independent with a long-term home exercise program for self-management of symptoms.  Baseline: Initial HEP to be provided at visit 2 as appropriate (03/06/23); Goal status: In progress  2.  Patient will demonstrate improved FOTO to equal or greater than 56 by visit #13 to demonstrate improvement in overall condition and self-reported functional ability.  Baseline: 48/100 (03/06/23); Goal status: In progress  3.  Patient will demonstrate full thoracic AROM without provocation of thoracic spine pain and tightness to improve his ability to complete tasks such as walking, sweeping, and washing dishes with less difficulty. Baseline: Limitations with extension/flexion and concordant pulling sensation with lateral flexion (03/06/23); Goal status: In progress  4.  Patient will demonstrate full cervical AROM without provocation of UT pain and thoracic spine tightness to improve ability to complete ADLs with less difficulty.  Baseline: Limited flexion and left side bending causing UT pain (03/06/23); Goal status: In progress  5.  Patient will demonstrate improvement in Patient Specific Functional Scale (PSFS) of equal or greater than 3 points to reflect clinically significant improvement in patient's most valued functional activities. Baseline: To be tested at visit 2 as  appropriate (03/06/23); 5/10 at visit #2 (03/13/2023);  Goal status: In progress   PLAN:  PT FREQUENCY: 1-2x/week  PT DURATION: 8-12 weeks  PLANNED INTERVENTIONS: 97164- PT Re-evaluation, 97110-Therapeutic exercises, 97530- Therapeutic activity, 97112- Neuromuscular re-education, 97535- Self Care, 40981- Manual therapy, 97014- Electrical stimulation (unattended), Patient/Family education, Dry Needling, Joint mobilization, Spinal mobilization, Cryotherapy, and Moist heat.  PLAN FOR NEXT SESSION: update HEP as appropriate, progressive postural/UE/functional strengthening, core strengthening, manual therapy as needed, education.   Clorine Swing [place], SPT [employer] DPTE   Huntley Dec R. Ilsa Iha, PT, DPT 04/16/23, 7:24 PM  [facility] [address] [place], [place] [address] P: [phone] I F: [phone]

## 2023-04-18 ENCOUNTER — Ambulatory Visit: Payer: Medicare Other | Admitting: Physical Therapy

## 2023-04-18 ENCOUNTER — Encounter: Payer: Self-pay | Admitting: Physical Therapy

## 2023-04-18 DIAGNOSIS — M546 Pain in thoracic spine: Secondary | ICD-10-CM | POA: Diagnosis not present

## 2023-04-18 DIAGNOSIS — M6281 Muscle weakness (generalized): Secondary | ICD-10-CM

## 2023-04-18 NOTE — Therapy (Signed)
 OUTPATIENT PHYSICAL THERAPY PROGRESS NOTE / TREATMENT Reporting from 03/06/23 to 04/18/23   Patient Name: Charles Mooney MRN: [id number] DOB:[birth year], 68 y.o., male Today's Date: 04/18/2023  END OF SESSION:  PT End of Session - 04/18/23 1744     Visit Number 10    Number of Visits 17    Date for PT Re-Evaluation 05/29/23    Authorization Type MEDICARE PART B reporting period from 03/06/23    Progress Note Due on Visit 20    PT Start Time 1303    PT Stop Time 1354    PT Time Calculation (min) 51 min    Activity Tolerance Patient tolerated treatment well    Behavior During Therapy [facility] for tasks assessed/performed                Past Medical History:  Diagnosis Date   Arthritis    Hepatitis    c 2000   Hypertension    Subarachnoid hemorrhage (HCC) 2017   Wears hearing aid in both ears    Past Surgical History:  Procedure Laterality Date   BRAIN SURGERY     [facility] COILING 2017   HERNIA REPAIR  1963   double hernia repair   JOINT REPLACEMENT Bilateral    TKR  L-2012, R-2015   SHOULDER ARTHROSCOPY WITH ROTATOR CUFF REPAIR AND OPEN BICEPS TENODESIS Left 09/08/2018   Procedure: LEFT SHOULDER ARTHROSCOPY,SUBSACAPULARIS REPAIR, SUBACROMIAL DECOMP, DISTAL CLAVICLE EXCISION,BICEP TENODESIS, MINI OPEN REGENTEN PATCH APPLICATION;  Surgeon: Signa Kell, MD;  Location: [facility] ORS;  Service: Orthopedics;  Laterality: Left;   Patient Active Problem List   Diagnosis Date Noted   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   Closed fracture of rib of left side with routine healing 09/04/2022   Chronic pain of left heel 08/31/2021   Arthropathy of left wrist 10/12/2020   Viral hepatitis C 11/02/2019   Lumbar facet arthropathy 04/27/2019   Lumbar degenerative disc disease 04/27/2019   Localized osteoarthritis of shoulder regions, bilateral 04/27/2019   Encounter for long-term opiate analgesic use 04/27/2019   Chronic SI joint pain 04/27/2019   Chronic pain syndrome 04/27/2019   Pain in  joint of left shoulder 06/03/2018   Abnormal gait 06/18/2017   Anatomical narrow angle 06/18/2017   Derangement of posterior horn of medial meniscus 06/18/2017   Edema 06/18/2017   Hip pain 06/18/2017   History of total knee arthroplasty 06/18/2017   Localized, primary osteoarthritis 06/18/2017   Muscle weakness 06/18/2017   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   Acute bursitis of right shoulder 06/14/2017   Nonallopathic lesion of sacral region 06/14/2017   Nonallopathic lesion of thoracic region 06/14/2017   Nonallopathic lesion of lumbosacral region 06/14/2017   Biomechanical lesion 06/14/2017   Chronic bilateral low back pain with bilateral sciatica 05/23/2017   Arthritis 04/19/2013   Cataract nuclear 06/16/2012   Glaucoma suspect of both eyes 06/16/2012    PCP: Enid Baas, MD  REFERRING PROVIDER: Edward Jolly, MD  REFERRING DIAG: myofascial pain syndrome of thoracic spine  Rationale for Evaluation and Treatment: Rehabilitation  THERAPY DIAG:  Pain in thoracic spine  Muscle weakness (generalized)  ONSET DATE: October 2024 (approximately 3 months after rib fracture in July 2024)  SUBJECTIVE:  PERTINENT HISTORY:  Patient is a 68 y.o. male who presents to outpatient physical therapy with a referral for medical diagnosis of myofascial pain syndrome of thoracic spine. This patient's chief complaints consist of chronic right sided thoracic spine pain, right shoulder pain, and occasional paresthesia to the right UE, leading to the following functional deficits: limits him with everyday activities/tasks to some amount, working on projects, working on the computer, walking for exercise, sleeping, doing as much as he wants to.  Relevant past medical history and comorbidities include  bilateral shoulder surgery (RTC repair, R most recent 12/2020) and subarachnoid hemorrhage in 2017. Patient denies hx of cancer, seizures, unexplained weight loss, unexplained changes in bowel or bladder problems, unexplained stumbling or dropping things, back surgery.    SUBJECTIVE STATEMENT: Patient arrives to session for progress note to assess progress following 10 skilled physical therapy since initial evaluation on 03/06/2023.   Feeling a little bit better today but the neck is still very tight  The tightness in his neck doesn't go down as far  Felt better after leaving last session but got worse again as time went on  Sub-occipital stretch has been helping  Continued right shoulder blade pain  He feels like both painful spots are from muscle tightness  Feeling a little bit better overall since starting PT  Doesn't feel ready to be discharged yet  HEP has been going well  Still has difficulty with building/using tools (working with tools overhead and down low) and working on the computer     Exercise-related information: (from 04/09/23) Overall goal: wants a training program/plan to help him get stronger and more flexible to support his pain reduction goals. Wants to know specifically what to do each session.  Available equipment:  Adjustable plate dumbbells (wide range of weight) Barbell + weights to load it to 400# Bench (adjustable so can do inclined) Preacher curl bench Curl bar (crooked not loopy kind) Inversion chair (likes this) Aerobic exercise: Walking in the woods with his 70# dog Patient's goal: 1 hour daily Recent success: 1 hour 7 out of 14 days Known barriers: cannot walk when he takes a pain pill due to safety concerns, so not able to perform if he is too painful.  Strength training:  Patient's goal: 1 in the mornings 7 days a week (with one day per week likely missed).  Recent success: 15-45 min more days than not (more often 15 min) Known barriers:  boredom Other info: pt states he "has all day" when asked how much time he has to exercise.   PAIN: Are you having pain? Yes NPRS: bilateral neck pain: 5/10; 4/10 near right scapula, 0/10 in right shoulder (posterior acromion near joint line), continued tenderness to bilateral ribs, but less noticeable    FUNCTIONAL LIMITATIONS: limits him with everyday activities and tasks, working on projects, working on the computer, walking for exercise, sleeping, doing as much as he wants to  [employer]: working in the shop, building and fixing things  PRECAUTIONS: None   WEIGHT BEARING RESTRICTIONS: No  FALLS:  Has patient fallen in last 6 months? Yes. Number of falls 2 and both falls occurred during the summer of 2024. One fall occurred in the woods where he was pulled over by his dog who was running. The second fall occurred in July 2024 while sleep walking and stumbling out of bed. This fall resulted in rib fracture on the left side.    PLOF: Independent  PATIENT GOALS: to get rid of the  pain, to get on an exercise and stretching regimen for his overall health, sleep with less difficulty from pain   NEXT MD VISIT:   OBJECTIVE  SELF-REPORTED FUNCTION FOTO score: 47/100 (thoracic spine questionnaire)   PATIENT SPECIFIC FUNCTIONAL SCALE   1. Exercise: 7  2. Building/Using Tools: 6  3. Walking: 8  Average: 7/10  SPINE MOTION   CERVICAL SPINE AROM *Indicates pain Flexion: 44 (no pain in right shoulder blade, some pain in neck) Extension: 25 Side Flexion:       R 10 and pain in right rib       L  11 (no pain) Rotation:  R 64 deg L 44 deg (very tight)     SEATED THORACIC SPINE AROM *Indicates pain Flexion: 46 deg Extension: 15 deg Side Flexion:       R 28 deg      L 36 deg Rotation:  R 61 deg L 55 deg (tight but no pain)    1RM TESTING Bent Over Row with contralateral UE braced (last tested 03/27/2023): Trial 1: 35# R: 1x10  L: 1x10   Trial 2: 65#   R: 1x5 (pt  unable to do anymore)   L: 1x7 (pt unable to do anymore)  1 RM for Right Arm: 75.8 (~50% = 35#) 1 RM for Left Arm: 80.2 (~50% = 40#)   Bent Over Shoulder Scaption ("Y"):  (last tested 03/27/2023): Trial 1: 5# DB   R: 1x10    L: 1x10   Trial 2: 7# DB   R: 1x8 (pt unable to do anymore)   L: 1x9 (pt unable to do anymore)  1 RM for Right Arm: 8.9 (~50% = 4#) 1 RM for Left Arm: 9.1 (~50% = 4#)   TREATMENT                                                                                                                           Therapeutic exercise: to centralize symptoms and improve ROM, strength, muscular endurance, and activity tolerance required for successful completion of functional activities.      Cervical AROM assessment (see results above)   Thoracic AROM assessment (see results above)  Quadruped Thread the Needle (upper thoracic rotation)  1x10 each side     Quadruped Cat/Cow  1x10   Self-Suboccipital stretch and C1-C1 Mobilization   2 second hold x 10  Education on HEP including handout (Self-Suboccipital stretch and C1-C1 Mobilization)   Manual therapy: to reduce pain and tissue tension, improve range of motion, neuromodulation, in order to promote improved ability to complete functional activities.      STM to bilateral suboccipitals and cervical paraspinals  Cervical distraction - 5 second hold x 6   Suboccipital and C1-C1 Mobilization (Flexion - Left Rotation - Right Side Bending) to reduce pain/tissue tension on the left side of his neck and increase cervical ROM  2x30 seconds   Pt required multimodal cuing for proper technique and to facilitate improved  neuromuscular control, strength, range of motion, and functional ability resulting in improved performance and form.   PATIENT EDUCATION:  Education details: Exercise purpose and form. Education on HEP including handout.  Person educated: Patient Education method: Explanation, Demonstration, Tactile  Cues, Verbal Cues, and Handouts.  Education comprehension: verbalized understanding and needs further education  HOME EXERCISE PROGRAM: Access Code: [email or web address] URL: [email or web address] Date: 04/16/2023 Prepared by: Catalia Massett [place]  Exercises - Dumbbell Squat at Shoulders  - 1 x daily - 2-3 x weekly - 2 sets - 20 reps - Seated Thoracic Lumbar Extension  - 1 x daily - 1 sets - 20 reps - 5 seconds hold - Standing Row with Anchored Resistance  - 3 x weekly - 2 sets - 20 reps - Resistance Pulldown with March  - 3 x weekly - 2 sets - 20 reps - Curator  - 3 x weekly - 2 sets - 1 min each arm carry time - Sitting Chest Press With Kettlebell  - 3 x weekly - 2 sets - 15 reps - Quadruped Cat Cow  - 1 x daily - 2 sets - 10 reps - Child's Pose with Sidebending  - 1 x daily - 2 sets - 30 hold - Quadruped Rock Back into [employer] Up  - 1 x daily - 1 sets - 10 reps - Prone Press Up  - 1 x daily - 2 sets - 10 reps - Standing Bent Over Single Arm Scapular Row with Table Support with PLB  - 3 x weekly - 2 sets - 15-20 reps - Single Arm Bent Over Shoulder Scaption with Dumbbell  - 3 x weekly - 2 sets - 15-20 reps - Sub-Occipital Cervical Stretch  - 1 x daily - 1 sets - 10 reps - 5 second hold  HOME EXERCISE PROGRAM [[email or web address]] View at [email or web address]" using code: [email or web address] Prayer Stretch -  Repeat 10 Repetitions, Hold 2 Seconds, Complete 2 Sets, Perform 3 Times a Day  HOME EXERCISE PROGRAM [[id number]] View at [email or web address] using code: [email or web address] Suboccipital Stretch and C1-C2 Mobilization -  Repeat 20 Repetitions, Hold 2 Seconds, Perform 3 Times a Day  6 Day Strength-Mobility Exercise Program  Determining how much weight to use with exercise:  If the goal of the exercise is strength and hypertrophy (muscle growth), choose a heavier weight that is very challenging by the end of 8-12 reps.   If the goal of the exercise is muscle endurance, choose a lighter  weight that feels very challenging after 10-15 reps (up to 20 reps).  If you can do over 20-30 reps with a weight, you likely aren't challenging the muscles enough to make strength/endurance gains.   Repetition recommendations apply to reps that you can do with proper form. For example, if your form fails after 2-3 reps, only do 2-3 reps until you can do more with good form or reduce the weight you are using if possible.     Day 1  Circuits Exercise Sets Reps Rest Interval   Warm Up   Aerobic Warm Up: Walking   Quadruped Rock Back into [employer] Up 2 10    Cat/Cow 2 10    Seated Thoracic / Lumbar Extensions 1 20 (5 second hold)    Main Workout   A1 Squat with dumbbells at shoulders 2 15 1  min  A2 Chest fly with resistance band  (anchored behind you) 2 15-20 1 min  1-2-minute rest following completion of circuit A  B1 Deadlifts with barbell  2 10-15 1 min  B2 Walking forward lunge with trunk rotation (holding dumbbell) 2 10  (each leg) 1 min  1-2-minute rest following completion of circuit B      Day 2  Circuits Exercise Sets Reps Rest Interval   Warm Up   Aerobic Warm Up: Walking   Orthoptist onto railing 3 30 second hold  (each side)    Seated [employer] Rollouts 2 10    Supine Snowangels on Exercise [place]  2 10    Main Workout   A1 Prone Y, T, A, Field Goals on Exercise Ball 2 10-15 (each motion) 1 min  A2 Suitcase Carry with dumbbell in one hand 2 1 min (each arm) 1 min  1-2-minute rest following completion of circuit A  B1 Kneeling lunge with trunk rotation against resistance band 2 15 1  min  B2 Sustained resistance band pull down with standing marches 2 20  (each leg) 1 min  1-2-minute rest following completion of circuit B  Day 3  Circuits Exercise Sets Reps Rest Interval   Warm Up   Aerobic Warm Up: Walking   Modified Prayer Stretch 3 30 second hold    Child's Pose with Side Bending 2 30 second hold  (each way)    Quadruped Rock  Back into [employer] Up 2 10    Main Workout   A1 Bent over single arm row  (heavy weight) 3 8-10 2 min  A2 Standing I, Y, T, A with resistance band  (facing anchor) 2 10-15 1 min  1-2-minute rest following completion of circuit A  B1 Squat - Bicep Curl - Overhead Press  with dumbbells  2 10-15 1 min  B2 Standing row with anchored resistance 2 20 1  min  1-2-minute rest following completion of circuit B   Day 4  Circuits Exercise Sets Reps Rest Interval   Warm Up   Aerobic Warm Up: Walking   Seated Thoracic / Lumbar Extensions 1 20 (5 second hold)    Cat/Cow 2 10    Quadruped Thread the Needle (upper thoracic rotation) 2 10    Main Workout   A1 Chest Press on bench with dumbbells 2 15 1  min  A2 Squats with dumbbells on shoulders 2 10-15 1 min  1-2-minute rest following completion of circuit A  B1 Serratus Push Ups on forearms  (just move shoulder blades) 2 15-20 1 min  B2 Standing "Chops" with resistance band  (high-low & low-high) 2 15-20 (each direction) 1 min  1-2-minute rest following completion of circuit B    Day 5  Circuits Exercise Sets Reps Rest Interval   Warm Up   Aerobic Warm Up: Walking   Modified Prayer Stretch 3 30 second hold    Seated 3-Way Ball Rollouts 2 10    Supine Snowangels on Exercise Ball  2 10    Main Workout   A1 Squat - Bicep Curl - Overhead Press  with dumbbells  2 10-15 1 min  A2 Bent over single arm row  (heavy weight) 3 8-10 2 min  1-2-minute rest following completion of circuit A  B1 Deadlifts with barbell (heavy weight) 3 8-10 2 min  B2 Prone Y, T, A, Field Goals on Exercise Ball 2 10-15 (each motion) 1 min  1-2-minute rest following completion of circuit B     Day 6  Circuits Exercise Sets Reps Rest Interval   Warm Up   Aerobic Warm Up: Walking   Orthoptist  onto railing 3 30 second hold  (each side)    Seated 3-Way Ball Rollouts 2 10    Quadruped Thread the Needle (upper thoracic rotation) 2 10     Main Workout    ASSESSMENT:  CLINICAL IMPRESSION: Patient has attended 10 skilled physical therapy treatment sessions this episode of care and has overall made progress towards stated goals. The focus of current treatment sessions is to progress exercises for postural strength/endurance and thoracic spine/scapular range of motion to improve functional ability and activity tolerance. Patient demonstrates improvement in pain level, activity tolerance, strength, thoracic spine range of motion, neuromuscular control, and understanding of self-management techniques. Patient continues to present with fluctuation/inconsistency with level of pain and significant range of motion/muscular endurance impairments that are limiting his ability to complete tasks including building/using tools with arms in an overhead position, completing computer work, and exercising without difficulty. He reports reduction in concordant right scapular pain with cervical and thoracic range of motion but would benefit from improvement in available ROM. Patient provided updated HEP/handout including Self-Suboccipital stretch and C1-C1 Mobilization. Patient will benefit from skilled physical therapy intervention to address current body structure impairments and activity limitations to improve function and work towards goals set in current POC in order to return to prior level of function or maximal functional improvement.    OBJECTIVE IMPAIRMENTS: decreased activity tolerance, decreased endurance, decreased knowledge of condition, decreased mobility, difficulty walking, decreased ROM, decreased strength, hypomobility, impaired perceived functional ability, increased muscle spasms, impaired flexibility, impaired UE functional use, postural dysfunction, and pain.   ACTIVITY LIMITATIONS: carrying, lifting, bending, sitting, standing, squatting, sleeping, transfers, bed mobility, bathing, dressing, hygiene/grooming, locomotion level, and  caring for others  PARTICIPATION LIMITATIONS: meal prep, cleaning, laundry, interpersonal relationship, driving, shopping, community activity, yard work, and everyday activities and tasks, working on projects, working on [employer], walking for exercise, sleeping, doing as much as he wants to   PERSONAL FACTORS: Age, Past/current experiences, Time since onset of injury/illness/exacerbation, and 3+ comorbidities: bilateral shoulder surgery (RTC repair, R most recent 12/2020), bilateral TKA, subarachnoid hemorrhage 2017 with SAH coiling, HTN, hx of hepatitis C (cured), double hernia repair, plantar facial fibromatosis, chronic pain syndrome, former smoker, Chronic bilateral low back pain with bilateral sciatica; Acute bursitis of right shoulder; Lumbar facet arthropathy; Lumbar degenerative disc disease; Localized osteoarthritis of shoulder regions, bilateral; Chronic SI joint pain; Chronic pain syndrome; Pain in joint of left shoulder; Abnormal gait; Anatomical narrow angle; Biomechanical lesion; Cataract nuclear; Derangement of posterior horn of medial meniscus; Edema; Glaucoma suspect of both eyes; Hip pain; History of total knee arthroplasty; Localized, primary osteoarthritis; Hx of subarachnoid hemophage, Muscle weakness; Arthritis; Plantar fascial fibromatosis; Viral hepatitis C; Arthropathy of left wrist; Chronic pain of left heel; Intercostal neuralgia; and Closed fracture of rib of left side with routine healing,  has a past medical history of Arthritis, Hepatitis, Hypertension, Subarachnoid hemorrhage (HCC) (2017), and Wears hearing aid in both ears.  has a past surgical history that includes Brain surgery; Joint replacement (Bilateral); Shoulder arthroscopy with rotator cuff repair and open biceps tenodesis (Left, 09/08/2018); and Hernia repair (1963). Also had surgery on right shoulder are also affecting patient's functional outcome.   REHAB POTENTIAL: Good  CLINICAL DECISION MAKING:  Evolving/moderate complexity  EVALUATION COMPLEXITY: Moderate   GOALS: Goals reviewed with patient? No  SHORT TERM GOALS: Target date: 03/20/2023  Patient will be independent with initial home exercise program for self-management of symptoms. Baseline: Initial HEP to be provided at visit 2 as  appropriate (03/06/23); continued participation in HEP (04/18/23); Goal status: MET   LONG TERM GOALS: Target date: 05/29/2023  Patient will be independent with a long-term home exercise program for self-management of symptoms.  Baseline: Initial HEP to be provided at visit 2 as appropriate (03/06/23); continued participation in HEP (04/18/23); Goal status: In progress  2.  Patient will demonstrate improved FOTO to equal or greater than 56 by visit #13 to demonstrate improvement in overall condition and self-reported functional ability.  Baseline: 48/100 (03/06/23); 47/100 (04/18/23); Goal status: In progress  3.  Patient will demonstrate full thoracic AROM without provocation of thoracic spine pain and tightness to improve his ability to complete tasks such as walking, sweeping, and washing dishes with less difficulty. Baseline: Limitations with extension/flexion and concordant pulling sensation with lateral flexion (03/06/23); 46 degrees of flexion, 15 degrees of extension, continued limitations in ROM with later flexion but not painful with motion (04/18/23); Goal status: In progress  4.  Patient will demonstrate full cervical AROM without provocation of UT pain and thoracic spine tightness to improve ability to complete ADLs with less difficulty.  Baseline: Limited flexion and left side bending causing UT pain (03/06/23); 44 degrees and no pain with cervical flexion, 11 degrees of left side bending with tightness but no pain (04/18/23) Goal status: In progress  5.  Patient will demonstrate improvement in Patient Specific Functional Scale (PSFS) of equal or greater than 3 points to reflect clinically  significant improvement in patient's most valued functional activities. Baseline: To be tested at visit 2 as appropriate (03/06/23); 5/10 at visit #2 (03/13/2023); 7/10 at visit #10 (04/18/23); Goal status: In progress   PLAN:  PT FREQUENCY: 1-2x/week  PT DURATION: 8-12 weeks  PLANNED INTERVENTIONS: 97164- PT Re-evaluation, 97110-Therapeutic exercises, 97530- Therapeutic activity, 97112- Neuromuscular re-education, 97535- Self Care, 16109- Manual therapy, 97014- Electrical stimulation (unattended), Patient/Family education, Dry Needling, Joint mobilization, Spinal mobilization, Cryotherapy, and Moist heat.  PLAN FOR NEXT SESSION: update HEP as appropriate, progressive postural/UE/functional strengthening, core strengthening, manual therapy as needed, education.   Levander Katzenstein [place], SPT [employer] DPTE   Huntley Dec R. Ilsa Iha, PT, DPT 04/18/23, 7:48 PM  [facility] [address] [place], [place] [address] P: [phone] I F: [phone]

## 2023-04-23 ENCOUNTER — Encounter: Payer: Self-pay | Admitting: Physical Therapy

## 2023-04-23 ENCOUNTER — Ambulatory Visit
Payer: Medicare Other | Attending: Student in an Organized Health Care Education/Training Program | Admitting: Physical Therapy

## 2023-04-23 DIAGNOSIS — M5415 Radiculopathy, thoracolumbar region: Secondary | ICD-10-CM | POA: Diagnosis present

## 2023-04-23 DIAGNOSIS — M6281 Muscle weakness (generalized): Secondary | ICD-10-CM | POA: Insufficient documentation

## 2023-04-23 DIAGNOSIS — M79672 Pain in left foot: Secondary | ICD-10-CM | POA: Insufficient documentation

## 2023-04-23 DIAGNOSIS — M546 Pain in thoracic spine: Secondary | ICD-10-CM | POA: Insufficient documentation

## 2023-04-23 DIAGNOSIS — M5459 Other low back pain: Secondary | ICD-10-CM | POA: Insufficient documentation

## 2023-04-23 NOTE — Therapy (Unsigned)
 OUTPATIENT PHYSICAL THERAPY TREATMENT   Patient Name: Charles Mooney MRN: [id number] DOB:[birth year], 68 y.o., male Today's Date: 04/23/2023  END OF SESSION:  PT End of Session - 04/23/23 2048     Visit Number 11    Number of Visits 17    Date for PT Re-Evaluation 05/29/23    Authorization Type MEDICARE PART B reporting period from 03/06/23    Progress Note Due on Visit 20    PT Start Time 0903    PT Stop Time 0950    PT Time Calculation (min) 47 min    Activity Tolerance Patient tolerated treatment well    Behavior During Therapy [facility] for tasks assessed/performed                 Past Medical History:  Diagnosis Date   Arthritis    Hepatitis    c 2000   Hypertension    Subarachnoid hemorrhage (HCC) 2017   Wears hearing aid in both ears    Past Surgical History:  Procedure Laterality Date   BRAIN SURGERY     [facility] COILING 2017   HERNIA REPAIR  1963   double hernia repair   JOINT REPLACEMENT Bilateral    TKR  L-2012, R-2015   SHOULDER ARTHROSCOPY WITH ROTATOR CUFF REPAIR AND OPEN BICEPS TENODESIS Left 09/08/2018   Procedure: LEFT SHOULDER ARTHROSCOPY,SUBSACAPULARIS REPAIR, SUBACROMIAL DECOMP, DISTAL CLAVICLE EXCISION,BICEP TENODESIS, MINI OPEN REGENTEN PATCH APPLICATION;  Surgeon: Signa Kell, MD;  Location: [facility] ORS;  Service: Orthopedics;  Laterality: Left;   Patient Active Problem List   Diagnosis Date Noted   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   Closed fracture of rib of left side with routine healing 09/04/2022   Chronic pain of left heel 08/31/2021   Arthropathy of left wrist 10/12/2020   Viral hepatitis C 11/02/2019   Lumbar facet arthropathy 04/27/2019   Lumbar degenerative disc disease 04/27/2019   Localized osteoarthritis of shoulder regions, bilateral 04/27/2019   Encounter for long-term opiate analgesic use 04/27/2019   Chronic SI joint pain 04/27/2019   Chronic pain syndrome 04/27/2019   Pain in joint of left shoulder 06/03/2018   Abnormal gait  06/18/2017   Anatomical narrow angle 06/18/2017   Derangement of posterior horn of medial meniscus 06/18/2017   Edema 06/18/2017   Hip pain 06/18/2017   History of total knee arthroplasty 06/18/2017   Localized, primary osteoarthritis 06/18/2017   Muscle weakness 06/18/2017   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   Acute bursitis of right shoulder 06/14/2017   Nonallopathic lesion of sacral region 06/14/2017   Nonallopathic lesion of thoracic region 06/14/2017   Nonallopathic lesion of lumbosacral region 06/14/2017   Biomechanical lesion 06/14/2017   Chronic bilateral low back pain with bilateral sciatica 05/23/2017   Arthritis 04/19/2013   Cataract nuclear 06/16/2012   Glaucoma suspect of both eyes 06/16/2012    PCP: Enid Baas, MD  REFERRING PROVIDER: Edward Jolly, MD  REFERRING DIAG: myofascial pain syndrome of thoracic spine  Rationale for Evaluation and Treatment: Rehabilitation  THERAPY DIAG:  Pain in thoracic spine  Muscle weakness (generalized)  ONSET DATE: October 2024 (approximately 3 months after rib fracture in July 2024)  SUBJECTIVE:  PERTINENT HISTORY:  Patient is a 68 y.o. male who presents to outpatient physical therapy with a referral for medical diagnosis of myofascial pain syndrome of thoracic spine. This patient's chief complaints consist of chronic right sided thoracic spine pain, right shoulder pain, and occasional paresthesia to the right UE, leading to the following functional deficits: limits him with everyday activities/tasks to some amount, working on projects, working on the computer, walking for exercise, sleeping, doing as much as he wants to.  Relevant past medical history and comorbidities include bilateral shoulder surgery (RTC repair, R most recent  12/2020) and subarachnoid hemorrhage in 2017. Patient denies hx of cancer, seizures, unexplained weight loss, unexplained changes in bowel or bladder problems, unexplained stumbling or dropping things, back surgery.    SUBJECTIVE STATEMENT: Pateint states he is feeling reasonable today. He states his main goal of all of this is to get off of hydrocodone. He keep shaving various parts that hurt. He states he has made some progress here in the last month and he is encouraged. But he states we have some work to do. The original pain near the right posterior shoulder is better but still there, but the main problem he is having now is the neck and it is overshadowing the original pain. He is doing better than expected with the fitness program designed in PT. He has been trying to do some of the HEP for his neck, but he is confused about how to do it. He states his neck bothers him with all of the fitness program. He thinks the neck issue started before the shoulder issue but it faded away. In September when he was trying different levels of foam with his pillow to help it when he slept. He starts on his side and ends up on his back. He is having a hard time finding a neutral position for his neck to keep it from aching when sleeping. He does not recall having a neck injury. 2-3 visits ago when he was really complaining about pain, he had fallen asleep in a leaned back chair and his head fell back into extension. When he woke up he could not move his head. When he finally convinced his body to move it, it hurt like crazy. It felt like those muscles just contracted and got set and did not want to release. He has only been able to trust Dr. Mardelle Matte the chiropractor for his neck. He denies any neck surgery.   PAIN: Are you having pain? Yes NPRS: bilateral neck pain: 5/10 Neck pain location: worst on right. originates in mid cervical spine and up to suboccipitals, spreads to about T4.  Agg: most irritated with looking  down, or any position to long.   FUNCTIONAL LIMITATIONS: limits him with everyday activities and tasks, working on projects, working on the computer, walking for exercise, sleeping, doing as much as he wants to  [employer]: working in the shop, building and fixing things  PRECAUTIONS: None   WEIGHT BEARING RESTRICTIONS: No  FALLS:  Has patient fallen in last 6 months? Yes. Number of falls 2 and both falls occurred during the summer of 2024. One fall occurred in the woods where he was pulled over by his dog who was running. The second fall occurred in July 2024 while sleep walking and stumbling out of bed. This fall resulted in rib fracture on the left side.    PLOF: Independent  PATIENT GOALS: to get rid of the pain, to get on an exercise and  stretching regimen for his overall health, sleep with less difficulty from pain   NEXT MD VISIT:   OBJECTIVE  NECK AROM Flexion: 52 pulling towards right shoulder and in mid cervical spine  Extension: 35 pinch at CT junction, slight pain towards right shoulder  Rotation:  R: 70 limited and painful on both sides L: 45 more limited and painful mostly on right   Sidebending:  R: 15 very stiff and painful towards right posterior shoulder and a little at left suboccipital L: 15 stiff and painful on left suboccipital  Protrusion: 2 inches. slight pain/tightness right suboccipital region and towards right shoulder Retraction: 1.5 inches. tight in the lower back  NECK SPECIAL TESTS Compression: negative  Spurling's L: catch at left mid c-spine getting to the position, then pain to right posterior shoulder R: no pain (very stiff).  Distraction: no effect during, twinge of pain near mid cervical spine on left during release.    1RM TESTING Bent Over Row with contralateral UE braced (last tested 03/27/2023): Trial 1: 35# R: 1x10  L: 1x10   Trial 2: 65#   R: 1x5 (pt unable to do anymore)   L: 1x7 (pt unable to do anymore)  1 RM for Right  Arm: 75.8 (~50% = 35#) 1 RM for Left Arm: 80.2 (~50% = 40#)   Bent Over Shoulder Scaption ("Y"):  (last tested 03/27/2023): Trial 1: 5# DB   R: 1x10    L: 1x10   Trial 2: 7# DB   R: 1x8 (pt unable to do anymore)   L: 1x9 (pt unable to do anymore)  1 RM for Right Arm: 8.9 (~50% = 4#) 1 RM for Left Arm: 9.1 (~50% = 4#)   TREATMENT                                                                                                                           Therapeutic exercise: to centralize symptoms and improve ROM, strength, muscular endurance, and activity tolerance required for successful completion of functional activities.      Neck exam to help guide treatment (see above) Review of suboccipital stretch from HEP   Manual therapy: to reduce pain and tissue tension, improve range of motion, neuromodulation, in order to promote improved ability to complete functional activities.   PRONE STM to bilateral suboccipital muscles   Pt required multimodal cuing for proper technique and to facilitate improved neuromuscular control, strength, range of motion, and functional ability resulting in improved performance and form.    Trigger Point Dry Needling  Initial Treatment: Pt instructed on Dry Needling rational, procedures, and possible side effects. Pt instructed to expect mild to moderate muscle soreness later in the day and/or into the next day.  Pt instructed in methods to reduce muscle soreness. Pt instructed to continue prescribed HEP. Because Dry Needling was performed over or adjacent to a lung field, pt was educated on S/S of pneumothorax and to seek immediate medical attention should they  occur.  Patient was educated on signs and symptoms of infection and other risk factors and advised to seek medical attention should they occur.  Patient verbalized understanding of these instructions and education.   Patient Verbal Consent Given: Yes Education Handout Provided: Yes Muscles  Treated: 2 dry needle(s) .25mm x 40mm inserted with 1-2 stick into each side of the suboccipital muscles with patient in prone position to decrease pain and spasms along patient's neck region. Electrical Stimulation Performed: No Treatment Response/Outcome: Patient with twitch response bilaterally and no abnormal response to dry needling. He reported improved comfort with cervical AROM following needling.   PATIENT EDUCATION:  Education details: Exercise purpose and form. Education on HEP including handout. Dry needling.  Person educated: Patient Education method: Explanation, Demonstration, Tactile Cues, Verbal Cues, and Handouts.  Education comprehension: verbalized understanding and needs further education  HOME EXERCISE PROGRAM: Access Code: [email or web address] URL: [email or web address] Date: 04/16/2023 Prepared by: Sydney [place]  Exercises - Dumbbell Squat at Shoulders  - 1 x daily - 2-3 x weekly - 2 sets - 20 reps - Seated Thoracic Lumbar Extension  - 1 x daily - 1 sets - 20 reps - 5 seconds hold - Standing Row with Anchored Resistance  - 3 x weekly - 2 sets - 20 reps - Resistance Pulldown with March  - 3 x weekly - 2 sets - 20 reps - Curator  - 3 x weekly - 2 sets - 1 min each arm carry time - Sitting Chest Press With Kettlebell  - 3 x weekly - 2 sets - 15 reps - Quadruped Cat Cow  - 1 x daily - 2 sets - 10 reps - Child's Pose with Sidebending  - 1 x daily - 2 sets - 30 hold - Quadruped Rock Back into [employer] Up  - 1 x daily - 1 sets - 10 reps - Prone Press Up  - 1 x daily - 2 sets - 10 reps - Standing Bent Over Single Arm Scapular Row with Table Support with PLB  - 3 x weekly - 2 sets - 15-20 reps - Single Arm Bent Over Shoulder Scaption with Dumbbell  - 3 x weekly - 2 sets - 15-20 reps - Sub-Occipital Cervical Stretch  - 1 x daily - 1 sets - 10 reps - 5 second hold  HOME EXERCISE PROGRAM [[email or web address]] View at "[email or web address]" using code:  [email or web address] Prayer Stretch -  Repeat 10 Repetitions, Hold 2 Seconds, Complete 2 Sets, Perform 3 Times a Day  HOME EXERCISE PROGRAM [[id number]] View at [email or web address] using code: [email or web address] Suboccipital Stretch and C1-C2 Mobilization -  Repeat 20 Repetitions, Hold 2 Seconds, Perform 3 Times a Day  6 Day Strength-Mobility Exercise Program  Determining how much weight to use with exercise:  If the goal of the exercise is strength and hypertrophy (muscle growth), choose a heavier weight that is very challenging by the end of 8-12 reps.   If the goal of the exercise is muscle endurance, choose a lighter weight that feels very challenging after 10-15 reps (up to 20 reps).  If you can do over 20-30 reps with a weight, you likely aren't challenging the muscles enough to make strength/endurance gains.   Repetition recommendations apply to reps that you can do with proper form. For example, if your form fails after 2-3 reps, only do 2-3 reps until you can do more with good form or reduce the weight you are using if possible.     Day 1  Circuits Exercise Sets Reps Rest Interval   Warm Up   Aerobic Warm Up: Walking   Quadruped Rock Back into [employer] Up 2 10    Cat/Cow 2 10    Seated Thoracic / Lumbar Extensions 1 20 (5 second hold)    Main Workout   A1 Squat with dumbbells at shoulders 2 15 1  min  A2 Chest fly with resistance band  (anchored behind you) 2 15-20 1 min  1-2-minute rest following completion of circuit A  B1 Deadlifts with barbell 2 10-15 1 min  B2 Walking forward lunge with trunk rotation (holding dumbbell) 2 10  (each leg) 1 min  1-2-minute rest following completion of circuit B      Day 2  Circuits Exercise Sets Reps Rest Interval   Warm Up   Aerobic Warm Up: Walking   Orthoptist onto railing 3 30 second hold  (each side)    Seated [employer] Rollouts 2 10    Supine [employer]angels on Exercise [place]  2 10    Main Workout   A1 Prone Y, T, A,  Field Goals on Exercise Ball 2 10-15 (each motion) 1 min  A2 Suitcase Carry with dumbbell in one hand 2 1 min (each arm) 1 min  1-2-minute rest following completion of circuit A  B1 Kneeling lunge with trunk rotation against resistance band 2 15 1  min  B2 Sustained resistance band pull down with standing marches 2 20  (each leg) 1 min  1-2-minute rest following completion of circuit B  Day 3  Circuits Exercise Sets Reps Rest Interval   Warm Up   Aerobic Warm Up: Walking   Modified Prayer Stretch 3 30 second hold    Child's Pose with Side Bending 2 30 second hold  (each way)    Quadruped Rock Back into [employer] Up 2 10    Main Workout   A1 Bent over single arm row  (heavy weight) 3 8-10 2 min  A2 Standing I, Y, T, A with resistance band  (facing anchor) 2 10-15 1 min  1-2-minute rest following completion of circuit A  B1 Squat - Bicep Curl - Overhead Press  with dumbbells  2 10-15 1 min  B2 Standing row with anchored resistance 2 20 1  min  1-2-minute rest following completion of circuit B   Day 4  Circuits Exercise Sets Reps Rest Interval   Warm Up   Aerobic Warm Up: Walking   Seated Thoracic / Lumbar Extensions 1 20 (5 second hold)    Cat/Cow 2 10    Quadruped Thread the Needle (upper thoracic rotation) 2 10    Main Workout   A1 Chest Press on bench with dumbbells 2 15 1  min  A2 Squats with dumbbells on shoulders 2 10-15 1 min  1-2-minute rest following completion of circuit A  B1 Serratus Push Ups on forearms  (just move shoulder blades) 2 15-20 1 min  B2 Standing "Chops" with resistance band  (high-low & low-high) 2 15-20 (each direction) 1 min  1-2-minute rest following completion of circuit B    Day 5  Circuits Exercise Sets Reps Rest Interval   Warm Up   Aerobic Warm Up: Walking   Modified Prayer Stretch 3 30 second hold    Seated 3-Way Ball Rollouts 2 10    Supine [employer]angels on Exercise Ball  2 10    Main Workout   A1 Squat - Bicep Curl -  [employer]  with  dumbbells  2 10-15 1 min  A2 Bent over single arm row  (heavy weight) 3 8-10 2 min  1-2-minute rest following completion of circuit A  B1 Deadlifts with barbell (heavy weight) 3 8-10 2 min  B2 Prone Y, T, A, Field Goals on Exercise Ball 2 10-15 (each motion) 1 min  1-2-minute rest following completion of circuit B     Day 6  Circuits Exercise Sets Reps Rest Interval   Warm Up   Aerobic Warm Up: Walking   Crossbody Stretch holding onto railing 3 30 second hold  (each side)    Seated 3-Way Ball Rollouts 2 10    Quadruped Thread the Needle (upper thoracic rotation) 2 10    Main Workout    ASSESSMENT:  CLINICAL IMPRESSION: Patient arrived with continued concern about he neck pain, so focus of today's session was on the cervical spine. He continues to report increased right thoracic symptoms with movements and testing of the cervical spine and addressing the neck will likely contribute to improving right thoracic pain. Dry needling introduced today with initial treatment focusing on suboccipital region with improvement in comfort with movement following. Plan to consider dry needling the mid cervical multifidi and/or spleneus capitus/cervicis at next session with exercises for improved muscle function following. Patient would benefit from continued management of limiting condition by skilled physical therapist to address remaining impairments and functional limitations to work towards stated goals and return to PLOF or maximal functional independence.   OBJECTIVE IMPAIRMENTS: decreased activity tolerance, decreased endurance, decreased knowledge of condition, decreased mobility, difficulty walking, decreased ROM, decreased strength, hypomobility, impaired perceived functional ability, increased muscle spasms, impaired flexibility, impaired UE functional use, postural dysfunction, and pain.   ACTIVITY LIMITATIONS: carrying, lifting, bending, sitting, standing,  squatting, sleeping, transfers, bed mobility, bathing, dressing, hygiene/grooming, locomotion level, and caring for others  PARTICIPATION LIMITATIONS: meal prep, cleaning, laundry, interpersonal relationship, driving, shopping, community activity, yard work, and everyday activities and tasks, working on projects, working on [employer], walking for exercise, sleeping, doing as much as he wants to   PERSONAL FACTORS: Age, Past/current experiences, Time since onset of injury/illness/exacerbation, and 3+ comorbidities: bilateral shoulder surgery (RTC repair, R most recent 12/2020), bilateral TKA, subarachnoid hemorrhage 2017 with SAH coiling, HTN, hx of hepatitis C (cured), double hernia repair, plantar facial fibromatosis, chronic pain syndrome, former smoker, Chronic bilateral low back pain with bilateral sciatica; Acute bursitis of right shoulder; Lumbar facet arthropathy; Lumbar degenerative disc disease; Localized osteoarthritis of shoulder regions, bilateral; Chronic SI joint pain; Chronic pain syndrome; Pain in joint of left shoulder; Abnormal gait; Anatomical narrow angle; Biomechanical lesion; Cataract nuclear; Derangement of posterior horn of medial meniscus; Edema; Glaucoma suspect of both eyes; Hip pain; History of total knee arthroplasty; Localized, primary osteoarthritis; Hx of subarachnoid hemophage, Muscle weakness; Arthritis; Plantar fascial fibromatosis; Viral hepatitis C; Arthropathy of left wrist; Chronic pain of left heel; Intercostal neuralgia; and Closed fracture of rib of left side with routine healing,  has a past medical history of Arthritis, Hepatitis, Hypertension, Subarachnoid hemorrhage (HCC) (2017), and Wears hearing aid in both ears.  has a past surgical history that includes Brain surgery; Joint replacement (Bilateral); Shoulder arthroscopy with rotator cuff repair and open biceps tenodesis (Left, 09/08/2018); and Hernia repair (1963). Also had surgery on right shoulder are also  affecting patient's functional outcome.   REHAB POTENTIAL: Good  CLINICAL DECISION MAKING: Evolving/moderate complexity  EVALUATION COMPLEXITY: Moderate   GOALS: Goals reviewed with patient? No  SHORT TERM GOALS:  Target date: 03/20/2023  Patient will be independent with initial home exercise program for self-management of symptoms. Baseline: Initial HEP to be provided at visit 2 as appropriate (03/06/23); continued participation in HEP (04/18/23); Goal status: MET   LONG TERM GOALS: Target date: 05/29/2023  Patient will be independent with a long-term home exercise program for self-management of symptoms.  Baseline: Initial HEP to be provided at visit 2 as appropriate (03/06/23); continued participation in HEP (04/18/23); Goal status: In progress  2.  Patient will demonstrate improved FOTO to equal or greater than 56 by visit #13 to demonstrate improvement in overall condition and self-reported functional ability.  Baseline: 48/100 (03/06/23); 47/100 (04/18/23); Goal status: In progress  3.  Patient will demonstrate full thoracic AROM without provocation of thoracic spine pain and tightness to improve his ability to complete tasks such as walking, sweeping, and washing dishes with less difficulty. Baseline: Limitations with extension/flexion and concordant pulling sensation with lateral flexion (03/06/23); 46 degrees of flexion, 15 degrees of extension, continued limitations in ROM with later flexion but not painful with motion (04/18/23); Goal status: In progress  4.  Patient will demonstrate full cervical AROM without provocation of UT pain and thoracic spine tightness to improve ability to complete ADLs with less difficulty.  Baseline: Limited flexion and left side bending causing UT pain (03/06/23); 44 degrees and no pain with cervical flexion, 11 degrees of left side bending with tightness but no pain (04/18/23) Goal status: In progress  5.  Patient will demonstrate improvement in  Patient Specific Functional Scale (PSFS) of equal or greater than 3 points to reflect clinically significant improvement in patient's most valued functional activities. Baseline: To be tested at visit 2 as appropriate (03/06/23); 5/10 at visit #2 (03/13/2023); 7/10 at visit #10 (04/18/23); Goal status: In progress   PLAN:  PT FREQUENCY: 1-2x/week  PT DURATION: 8-12 weeks  PLANNED INTERVENTIONS: 97164- PT Re-evaluation, 97110-Therapeutic exercises, 97530- Therapeutic activity, 97112- Neuromuscular re-education, 97535- Self Care, 29562- Manual therapy, 97014- Electrical stimulation (unattended), Patient/Family education, Dry Needling, Joint mobilization, Spinal mobilization, Cryotherapy, and Moist heat.  PLAN FOR NEXT SESSION: update HEP as appropriate, progressive postural/UE/functional strengthening, core strengthening, manual therapy as needed, education.     Luretha Murphy. Ilsa Iha, PT, DPT 04/23/23, 8:49 PM  [facility] [address] [place], [place] [address] P: [phone] I F: [phone]

## 2023-04-23 NOTE — Patient Instructions (Signed)

## 2023-04-25 ENCOUNTER — Ambulatory Visit: Payer: Medicare Other | Admitting: Physical Therapy

## 2023-04-25 DIAGNOSIS — M6281 Muscle weakness (generalized): Secondary | ICD-10-CM

## 2023-04-25 DIAGNOSIS — M546 Pain in thoracic spine: Secondary | ICD-10-CM

## 2023-04-25 LAB — GENECONNECT MOLECULAR SCREEN: Genetic Analysis Overall Interpretation: NEGATIVE

## 2023-04-25 NOTE — Therapy (Signed)
 OUTPATIENT PHYSICAL THERAPY TREATMENT   Patient Name: Charles Mooney MRN: [id number] DOB:14-May-1955, 68 y.o., male Today's Date: 04/25/2023  END OF SESSION:  PT End of Session - 04/25/23 0956     Visit Number 12    Number of Visits 17    Date for PT Re-Evaluation 05/29/23    Authorization Type MEDICARE PART B reporting period from 03/06/23    Progress Note Due on Visit 20    PT Start Time 0902    PT Stop Time 0948    PT Time Calculation (min) 46 min    Activity Tolerance Patient tolerated treatment well    Behavior During Therapy [facility] for tasks assessed/performed                  Past Medical History:  Diagnosis Date   Arthritis    Hepatitis    c 2000   Hypertension    Subarachnoid hemorrhage (HCC) 2017   Wears hearing aid in both ears    Past Surgical History:  Procedure Laterality Date   BRAIN SURGERY     [facility] COILING 2017   HERNIA REPAIR  1963   double hernia repair   JOINT REPLACEMENT Bilateral    TKR  L-2012, R-2015   SHOULDER ARTHROSCOPY WITH ROTATOR CUFF REPAIR AND OPEN BICEPS TENODESIS Left 09/08/2018   Procedure: LEFT SHOULDER ARTHROSCOPY,SUBSACAPULARIS REPAIR, SUBACROMIAL DECOMP, DISTAL CLAVICLE EXCISION,BICEP TENODESIS, MINI OPEN REGENTEN PATCH APPLICATION;  Surgeon: Signa Kell, MD;  Location: [facility] ORS;  Service: Orthopedics;  Laterality: Left;   Patient Active Problem List   Diagnosis Date Noted   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   Closed fracture of rib of left side with routine healing 09/04/2022   Chronic pain of left heel 08/31/2021   Arthropathy of left wrist 10/12/2020   Viral hepatitis C 11/02/2019   Lumbar facet arthropathy 04/27/2019   Lumbar degenerative disc disease 04/27/2019   Localized osteoarthritis of shoulder regions, bilateral 04/27/2019   Encounter for long-term opiate analgesic use 04/27/2019   Chronic SI joint pain 04/27/2019   Chronic pain syndrome 04/27/2019   Pain in joint of left shoulder 06/03/2018   Abnormal gait  06/18/2017   Anatomical narrow angle 06/18/2017   Derangement of posterior horn of medial meniscus 06/18/2017   Edema 06/18/2017   Hip pain 06/18/2017   History of total knee arthroplasty 06/18/2017   Localized, primary osteoarthritis 06/18/2017   Muscle weakness 06/18/2017   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   Acute bursitis of right shoulder 06/14/2017   Nonallopathic lesion of sacral region 06/14/2017   Nonallopathic lesion of thoracic region 06/14/2017   Nonallopathic lesion of lumbosacral region 06/14/2017   Biomechanical lesion 06/14/2017   Chronic bilateral low back pain with bilateral sciatica 05/23/2017   Arthritis 04/19/2013   Cataract nuclear 06/16/2012   Glaucoma suspect of both eyes 06/16/2012    PCP: Enid Baas, MD  REFERRING PROVIDER: Edward Jolly, MD  REFERRING DIAG: myofascial pain syndrome of thoracic spine  Rationale for Evaluation and Treatment: Rehabilitation  THERAPY DIAG:  Pain in thoracic spine  Muscle weakness (generalized)  ONSET DATE: October 2024 (approximately 3 months after rib fracture in July 2024)  SUBJECTIVE:  PERTINENT HISTORY:  Patient is a 68 y.o. male who presents to outpatient physical therapy with a referral for medical diagnosis of myofascial pain syndrome of thoracic spine. This patient's chief complaints consist of chronic right sided thoracic spine pain, right shoulder pain, and occasional paresthesia to the right UE, leading to the following functional deficits: limits him with everyday activities/tasks to some amount, working on projects, working on the computer, walking for exercise, sleeping, doing as much as he wants to.  Relevant past medical history and comorbidities include bilateral shoulder surgery (RTC repair, R most recent  12/2020) and subarachnoid hemorrhage in 2017. Patient denies hx of cancer, seizures, unexplained weight loss, unexplained changes in bowel or bladder problems, unexplained stumbling or dropping things, back surgery.    SUBJECTIVE STATEMENT: Pateint states his neck felt really good after last PT session but the mid to lower neck was really painful yesterday. Today his neck feels a little better but the spot in his right thoracic spine is really flaired up. It is currently 6/10 after taking 7.5mg  of his opioid pain killer at 5am. He woke up at 4:30 with a crying pain. He has been trying to avoid taking opioids in the morning and was able to avoid them in the AM for the last 2 days, but not today. He can feel the pain medicine he took at 5am wearing off now. He brought his self-mobilization device that he can attach to the wall with a suction cup, and he would like to use it to show PT exactly where he feels the painful muscle knots in his R thoracic region.   PAIN: Are you having pain? Yes NPRS: 6/10 at right thoracic spine near sapula, also painful but no NPR at neck.    FUNCTIONAL LIMITATIONS: limits him with everyday activities and tasks, working on projects, working on the computer, walking for exercise, sleeping, doing as much as he wants to  [employer]: working in the shop, building and fixing things  PRECAUTIONS: None   WEIGHT BEARING RESTRICTIONS: No  FALLS:  Has patient fallen in last 6 months? Yes. Number of falls 2 and both falls occurred during the summer of 2024. One fall occurred in the woods where he was pulled over by his dog who was running. The second fall occurred in July 2024 while sleep walking and stumbling out of bed. This fall resulted in rib fracture on the left side.    PLOF: Independent  PATIENT GOALS: to get rid of the pain, to get on an exercise and stretching regimen for his overall health, sleep with less difficulty from pain   NEXT MD VISIT:    OBJECTIVE   1RM TESTING Bent Over Row with contralateral UE braced (last tested 03/27/2023): Trial 1: 35# R: 1x10  L: 1x10   Trial 2: 65#   R: 1x5 (pt unable to do anymore)   L: 1x7 (pt unable to do anymore)  1 RM for Right Arm: 75.8 (~50% = 35#) 1 RM for Left Arm: 80.2 (~50% = 40#)   Bent Over Shoulder Scaption ("Y"):  (last tested 03/27/2023): Trial 1: 5# DB   R: 1x10    L: 1x10   Trial 2: 7# DB   R: 1x8 (pt unable to do anymore)   L: 1x9 (pt unable to do anymore)  1 RM for Right Arm: 8.9 (~50% = 4#) 1 RM for Left Arm: 9.1 (~50% = 4#)   TREATMENT  Manual therapy: to reduce pain and tissue tension, improve range of motion, neuromodulation, in order to promote improved ability to complete functional activities.   PRONE Palpation/STM to right rhomboids, levator scap, thoracic paraspinals, lower and mid trap R scapular mobilizations including distraction  CPA grade III-IV from C2 - T10,  Painful in neck not thoracic spine C2-C7, approx 10-20 seconds at each level Locally painful from approx T1-T4 with 1-2x 30 seconds at T3-T9 Decreased R thoracic pain with compression at approx T5 but no better after Compression to T7-T9 causes tingling at left hip (repots this has been happening for a long time)  STM to bilateral cervical spine paraspinals, multifidi  Pt required multimodal cuing for proper technique and to facilitate improved neuromuscular control, strength, range of motion, and functional ability resulting in improved performance and form.   Trigger Point Dry Needling  Subsequent Treatment: Instructions provided previously at initial dry needling treatment.  Instructions reviewed, if requested by the patient, prior to subsequent dry needling treatment.  Patient Verbal Consent Given: Yes Education Handout Provided: Previously Provided Muscles  Treated:  2 dry needle(s) .30mm x 60mm and 1 dry needle .30mm x 75 mm inserted with 5 sticks into right rhomboid major/minor and subscapularis muscles from medial boarder of scapula with scapula in winged position with patient in prone position to decrease pain and spasms along patient's right thoracic spine region.   1 dry needle(s) .30mm x 60mm inserted with 1 sticks each side and each level at approx C5 and C6 into cervical multifidi muscles with patient in prone position to decrease pain and spasms along patient's neck region.  Electrical Stimulation Performed: No Treatment Response/Outcome: Patient felt appropriate deep ache at targeted regions during needle insertion, then relief from concordant pain at neck and right thoracic region. Reduced pian at right thoracic region to 4/10 from 6/10.   PATIENT EDUCATION:  Education details: Exercise purpose and form. Education on HEP including handout. Dry needling.  Person educated: Patient Education method: Explanation, Demonstration, Tactile Cues, Verbal Cues, and Handouts.  Education comprehension: verbalized understanding and needs further education  HOME EXERCISE PROGRAM: Access Code: [email or web address] URL: [email or web address] Date: 04/16/2023 Prepared by: Sydney [place]  Exercises - Dumbbell Squat at Shoulders  - 1 x daily - 2-3 x weekly - 2 sets - 20 reps - Seated Thoracic Lumbar Extension  - 1 x daily - 1 sets - 20 reps - 5 seconds hold - Standing Row with Anchored Resistance  - 3 x weekly - 2 sets - 20 reps - Resistance Pulldown with March  - 3 x weekly - 2 sets - 20 reps - Curator  - 3 x weekly - 2 sets - 1 min each arm carry time - Sitting Chest Press With Kettlebell  - 3 x weekly - 2 sets - 15 reps - Quadruped Cat Cow  - 1 x daily - 2 sets - 10 reps - Child's Pose with Sidebending  - 1 x daily - 2 sets - 30 hold - Quadruped Rock Back into [employer] Up  - 1 x daily - 1 sets - 10 reps - Prone Press Up  -  1 x daily - 2 sets - 10 reps - Standing Bent Over Single Arm Scapular Row with Table Support with PLB  - 3 x weekly - 2 sets - 15-20 reps - Single Arm Bent Over Shoulder Scaption with Dumbbell  - 3 x weekly - 2 sets - 15-20 reps - Sub-Occipital Cervical Stretch  -  1 x daily - 1 sets - 10 reps - 5 second hold  HOME EXERCISE PROGRAM [[email or web address]] View at [email or web address]" using code: [email or web address] Prayer Stretch -  Repeat 10 Repetitions, Hold 2 Seconds, Complete 2 Sets, Perform 3 Times a Day  HOME EXERCISE PROGRAM [[id number]] View at [email or web address] using code: [email or web address] Suboccipital Stretch and C1-C2 Mobilization -  Repeat 20 Repetitions, Hold 2 Seconds, Perform 3 Times a Day  6 Day Strength-Mobility Exercise Program  Determining how much weight to use with exercise:  If the goal of the exercise is strength and hypertrophy (muscle growth), choose a heavier weight that is very challenging by the end of 8-12 reps.   If the goal of the exercise is muscle endurance, choose a lighter weight that feels very challenging after 10-15 reps (up to 20 reps).  If you can do over 20-30 reps with a weight, you likely aren't challenging the muscles enough to make strength/endurance gains.   Repetition recommendations apply to reps that you can do with proper form. For example, if your form fails after 2-3 reps, only do 2-3 reps until you can do more with good form or reduce the weight you are using if possible.     Day 1  Circuits Exercise Sets Reps Rest Interval   Warm Up   Aerobic Warm Up: Walking   Quadruped Rock Back into [employer] Up 2 10    Cat/Cow 2 10    Seated Thoracic / Lumbar Extensions 1 20 (5 second hold)    Main Workout   A1 Squat with dumbbells at shoulders 2 15 1  min  A2 Chest fly with resistance band  (anchored behind you) 2 15-20 1 min  1-2-minute rest following completion of circuit A  B1 Deadlifts with barbell 2 10-15 1 min  B2 Walking forward lunge with trunk  rotation (holding dumbbell) 2 10  (each leg) 1 min  1-2-minute rest following completion of circuit B      Day 2  Circuits Exercise Sets Reps Rest Interval   Warm Up   Aerobic Warm Up: Walking   Orthoptist onto railing 3 30 second hold  (each side)    Seated [employer] Rollouts 2 10    Supine Snowangels on Exercise [place]  2 10    Main Workout   A1 Prone Y, T, A, Field Goals on Exercise Ball 2 10-15 (each motion) 1 min  A2 Suitcase Carry with dumbbell in one hand 2 1 min (each arm) 1 min  1-2-minute rest following completion of circuit A  B1 Kneeling lunge with trunk rotation against resistance band 2 15 1  min  B2 Sustained resistance band pull down with standing marches 2 20  (each leg) 1 min  1-2-minute rest following completion of circuit B  Day 3  Circuits Exercise Sets Reps Rest Interval   Warm Up   Aerobic Warm Up: Walking   Modified Prayer Stretch 3 30 second hold    Child's Pose with Side Bending 2 30 second hold  (each way)    Quadruped Rock Back into [employer] Up 2 10    Main Workout   A1 Bent over single arm row  (heavy weight) 3 8-10 2 min  A2 Standing I, Y, T, A with resistance band  (facing anchor) 2 10-15 1 min  1-2-minute rest following completion of circuit A  B1 Squat - Bicep Curl - Overhead Press  with dumbbells  2 10-15 1 min  B2 Standing row with  anchored resistance 2 20 1  min  1-2-minute rest following completion of circuit B   Day 4  Circuits Exercise Sets Reps Rest Interval   Warm Up   Aerobic Warm Up: Walking   Seated Thoracic / Lumbar Extensions 1 20 (5 second hold)    Cat/Cow 2 10    Quadruped Thread the Needle (upper thoracic rotation) 2 10    Main Workout   A1 Chest Press on bench with dumbbells 2 15 1  min  A2 Squats with dumbbells on shoulders 2 10-15 1 min  1-2-minute rest following completion of circuit A  B1 Serratus Push Ups on forearms  (just move shoulder blades) 2 15-20 1 min  B2 Standing "Chops"  with resistance band  (high-low & low-high) 2 15-20 (each direction) 1 min  1-2-minute rest following completion of circuit B    Day 5  Circuits Exercise Sets Reps Rest Interval   Warm Up   Aerobic Warm Up: Walking   Modified Prayer Stretch 3 30 second hold    Seated 3-Way Ball Rollouts 2 10    Supine Snowangels on Exercise Ball  2 10    Main Workout   A1 Squat - Bicep Curl - Overhead Press  with dumbbells  2 10-15 1 min  A2 Bent over single arm row  (heavy weight) 3 8-10 2 min  1-2-minute rest following completion of circuit A  B1 Deadlifts with barbell (heavy weight) 3 8-10 2 min  B2 Prone Y, T, A, Field Goals on Exercise Ball 2 10-15 (each motion) 1 min  1-2-minute rest following completion of circuit B     Day 6  Circuits Exercise Sets Reps Rest Interval   Warm Up   Aerobic Warm Up: Walking   Crossbody Stretch holding onto railing 3 30 second hold  (each side)    Seated 3-Way Ball Rollouts 2 10    Quadruped Thread the Needle (upper thoracic rotation) 2 10    Main Workout    ASSESSMENT:  CLINICAL IMPRESSION: Patient arrives with report of pain relief at suboccipital region after needling yesterday but then worse pain in mid to lower cervical spine and increased pain in the right thoracic region. Manual therapy and dry needling were applied to these regions with a reduction in pain reported by patient. NPRS at right thoracic spine went from 6/10 to 4/10 by end of session. Patient does have temporary reduction of R thoracic pain during CPA to approx C5 and left hip paresthesia with CPA to segments lower than that. Plan to continue with exercises and manual interventions as needed to improve pain and functional activity tolerance. Patient would benefit from continued management of limiting condition by skilled physical therapist to address remaining impairments and functional limitations to work towards stated goals and return to PLOF or maximal functional  independence.   OBJECTIVE IMPAIRMENTS: decreased activity tolerance, decreased endurance, decreased knowledge of condition, decreased mobility, difficulty walking, decreased ROM, decreased strength, hypomobility, impaired perceived functional ability, increased muscle spasms, impaired flexibility, impaired UE functional use, postural dysfunction, and pain.   ACTIVITY LIMITATIONS: carrying, lifting, bending, sitting, standing, squatting, sleeping, transfers, bed mobility, bathing, dressing, hygiene/grooming, locomotion level, and caring for others  PARTICIPATION LIMITATIONS: meal prep, cleaning, laundry, interpersonal relationship, driving, shopping, community activity, yard work, and everyday activities and tasks, working on projects, working on the computer, walking for exercise, sleeping, doing as much as he wants to   PERSONAL FACTORS: Age, Past/current experiences, Time since onset of injury/illness/exacerbation, and 3+ comorbidities:  bilateral shoulder surgery (RTC repair, R most recent 12/2020), bilateral TKA, subarachnoid hemorrhage 2017 with SAH coiling, HTN, hx of hepatitis C (cured), double hernia repair, plantar facial fibromatosis, chronic pain syndrome, former smoker, Chronic bilateral low back pain with bilateral sciatica; Acute bursitis of right shoulder; Lumbar facet arthropathy; Lumbar degenerative disc disease; Localized osteoarthritis of shoulder regions, bilateral; Chronic SI joint pain; Chronic pain syndrome; Pain in joint of left shoulder; Abnormal gait; Anatomical narrow angle; Biomechanical lesion; Cataract nuclear; Derangement of posterior horn of medial meniscus; Edema; Glaucoma suspect of both eyes; Hip pain; History of total knee arthroplasty; Localized, primary osteoarthritis; Hx of subarachnoid hemophage, Muscle weakness; Arthritis; Plantar fascial fibromatosis; Viral hepatitis C; Arthropathy of left wrist; Chronic pain of left heel; Intercostal neuralgia; and Closed fracture of  rib of left side with routine healing,  has a past medical history of Arthritis, Hepatitis, Hypertension, Subarachnoid hemorrhage (HCC) (2017), and Wears hearing aid in both ears.  has a past surgical history that includes Brain surgery; Joint replacement (Bilateral); Shoulder arthroscopy with rotator cuff repair and open biceps tenodesis (Left, 09/08/2018); and Hernia repair (1963). Also had surgery on right shoulder are also affecting patient's functional outcome.   REHAB POTENTIAL: Good  CLINICAL DECISION MAKING: Evolving/moderate complexity  EVALUATION COMPLEXITY: Moderate   GOALS: Goals reviewed with patient? No  SHORT TERM GOALS: Target date: 03/20/2023  Patient will be independent with initial home exercise program for self-management of symptoms. Baseline: Initial HEP to be provided at visit 2 as appropriate (03/06/23); continued participation in HEP (04/18/23); Goal status: MET   LONG TERM GOALS: Target date: 05/29/2023  Patient will be independent with a long-term home exercise program for self-management of symptoms.  Baseline: Initial HEP to be provided at visit 2 as appropriate (03/06/23); continued participation in HEP (04/18/23); Goal status: In progress  2.  Patient will demonstrate improved FOTO to equal or greater than 56 by visit #13 to demonstrate improvement in overall condition and self-reported functional ability.  Baseline: 48/100 (03/06/23); 47/100 (04/18/23); Goal status: In progress  3.  Patient will demonstrate full thoracic AROM without provocation of thoracic spine pain and tightness to improve his ability to complete tasks such as walking, sweeping, and washing dishes with less difficulty. Baseline: Limitations with extension/flexion and concordant pulling sensation with lateral flexion (03/06/23); 46 degrees of flexion, 15 degrees of extension, continued limitations in ROM with later flexion but not painful with motion (04/18/23); Goal status: In progress  4.   Patient will demonstrate full cervical AROM without provocation of UT pain and thoracic spine tightness to improve ability to complete ADLs with less difficulty.  Baseline: Limited flexion and left side bending causing UT pain (03/06/23); 44 degrees and no pain with cervical flexion, 11 degrees of left side bending with tightness but no pain (04/18/23) Goal status: In progress  5.  Patient will demonstrate improvement in Patient Specific Functional Scale (PSFS) of equal or greater than 3 points to reflect clinically significant improvement in patient's most valued functional activities. Baseline: To be tested at visit 2 as appropriate (03/06/23); 5/10 at visit #2 (03/13/2023); 7/10 at visit #10 (04/18/23); Goal status: In progress   PLAN:  PT FREQUENCY: 1-2x/week  PT DURATION: 8-12 weeks  PLANNED INTERVENTIONS: 97164- PT Re-evaluation, 97110-Therapeutic exercises, 97530- Therapeutic activity, 97112- Neuromuscular re-education, 97535- Self Care, 16109- Manual therapy, 97014- Electrical stimulation (unattended), Patient/Family education, Dry Needling, Joint mobilization, Spinal mobilization, Cryotherapy, and Moist heat.  PLAN FOR NEXT SESSION: update HEP as appropriate, progressive postural/UE/functional strengthening, core  strengthening, manual therapy as needed, education.   Luretha Murphy. Ilsa Iha, PT, DPT 04/25/23, 1:33 PM  [facility] [address] [place], [place] [address] P: [phone] I F: [phone]

## 2023-04-30 ENCOUNTER — Ambulatory Visit: Payer: Medicare Other

## 2023-04-30 DIAGNOSIS — M79672 Pain in left foot: Secondary | ICD-10-CM

## 2023-04-30 DIAGNOSIS — M6281 Muscle weakness (generalized): Secondary | ICD-10-CM

## 2023-04-30 DIAGNOSIS — M5459 Other low back pain: Secondary | ICD-10-CM

## 2023-04-30 DIAGNOSIS — M546 Pain in thoracic spine: Secondary | ICD-10-CM | POA: Diagnosis not present

## 2023-04-30 DIAGNOSIS — M5415 Radiculopathy, thoracolumbar region: Secondary | ICD-10-CM

## 2023-04-30 NOTE — Therapy (Signed)
 OUTPATIENT PHYSICAL THERAPY TREATMENT   Patient Name: Charles Mooney MRN: [id number] DOB:[birth year], 68 y.o., male Today's Date: 04/30/2023  END OF SESSION:  PT End of Session - 04/30/23 0905     Visit Number 13    Number of Visits 17    Date for PT Re-Evaluation 05/29/23    Authorization Type MEDICARE PART B reporting period from 03/06/23    Progress Note Due on Visit 20    PT Start Time 0902    PT Stop Time 0942    PT Time Calculation (min) 40 min    Activity Tolerance Patient tolerated treatment well    Behavior During Therapy [facility] for tasks assessed/performed                  Past Medical History:  Diagnosis Date   Arthritis    Hepatitis    c 2000   Hypertension    Subarachnoid hemorrhage (HCC) 2017   Wears hearing aid in both ears    Past Surgical History:  Procedure Laterality Date   BRAIN SURGERY     [facility] COILING 2017   HERNIA REPAIR  1963   double hernia repair   JOINT REPLACEMENT Bilateral    TKR  L-2012, R-2015   SHOULDER ARTHROSCOPY WITH ROTATOR CUFF REPAIR AND OPEN BICEPS TENODESIS Left 09/08/2018   Procedure: LEFT SHOULDER ARTHROSCOPY,SUBSACAPULARIS REPAIR, SUBACROMIAL DECOMP, DISTAL CLAVICLE EXCISION,BICEP TENODESIS, MINI OPEN REGENTEN PATCH APPLICATION;  Surgeon: Signa Kell, MD;  Location: [facility] ORS;  Service: Orthopedics;  Laterality: Left;   Patient Active Problem List   Diagnosis Date Noted   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   Closed fracture of rib of left side with routine healing 09/04/2022   Chronic pain of left heel 08/31/2021   Arthropathy of left wrist 10/12/2020   Viral hepatitis C 11/02/2019   Lumbar facet arthropathy 04/27/2019   Lumbar degenerative disc disease 04/27/2019   Localized osteoarthritis of shoulder regions, bilateral 04/27/2019   Encounter for long-term opiate analgesic use 04/27/2019   Chronic SI joint pain 04/27/2019   Chronic pain syndrome 04/27/2019   Pain in joint of left shoulder 06/03/2018   Abnormal gait  06/18/2017   Anatomical narrow angle 06/18/2017   Derangement of posterior horn of medial meniscus 06/18/2017   Edema 06/18/2017   Hip pain 06/18/2017   History of total knee arthroplasty 06/18/2017   Localized, primary osteoarthritis 06/18/2017   Muscle weakness 06/18/2017   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   Acute bursitis of right shoulder 06/14/2017   Nonallopathic lesion of sacral region 06/14/2017   Nonallopathic lesion of thoracic region 06/14/2017   Nonallopathic lesion of lumbosacral region 06/14/2017   Biomechanical lesion 06/14/2017   Chronic bilateral low back pain with bilateral sciatica 05/23/2017   Arthritis 04/19/2013   Cataract nuclear 06/16/2012   Glaucoma suspect of both eyes 06/16/2012    PCP: Enid Baas, MD  REFERRING PROVIDER: Edward Jolly, MD  REFERRING DIAG: myofascial pain syndrome of thoracic spine  Rationale for Evaluation and Treatment: Rehabilitation  THERAPY DIAG:  Pain in thoracic spine  Muscle weakness (generalized)  Other low back pain  Pain in left foot  Radiculopathy, thoracolumbar region  ONSET DATE: October 2024 (approximately 3 months after rib fracture in July 2024)  SUBJECTIVE:  PERTINENT HISTORY:  Patient is a 68 y.o. male who presents to outpatient physical therapy with a referral for medical diagnosis of myofascial pain syndrome of thoracic spine. This patient's chief complaints consist of chronic right sided thoracic spine pain, right shoulder pain, and occasional paresthesia to the right UE, leading to the following functional deficits: limits him with everyday activities/tasks to some amount, working on projects, working on the computer, walking for exercise, sleeping, doing as much as he wants to.  Relevant past medical history  and comorbidities include bilateral shoulder surgery (RTC repair, R most recent 12/2020) and subarachnoid hemorrhage in 2017. Patient denies hx of cancer, seizures, unexplained weight loss, unexplained changes in bowel or bladder problems, unexplained stumbling or dropping things, back surgery.    SUBJECTIVE STATEMENT: Pt says things continue to feel improved overall. He says dry needling last session really seems helpful.    PAIN: Are you having pain? Yes; 4/10 neck and 3/10 Rt medial scapular zone     FUNCTIONAL LIMITATIONS: limits him with everyday activities and tasks, working on projects, working on the computer, walking for exercise, sleeping, doing as much as he wants to  [employer]: working in the shop, building and fixing things  PRECAUTIONS: None   WEIGHT BEARING RESTRICTIONS: No  FALLS:  Has patient fallen in last 6 months? Yes. Number of falls 2 and both falls occurred during the summer of 2024. One fall occurred in the woods where he was pulled over by his dog who was running. The second fall occurred in July 2024 while sleep walking and stumbling out of bed. This fall resulted in rib fracture on the left side.    PLOF: Independent  PATIENT GOALS: to get rid of the pain, to get on an exercise and stretching regimen for his overall health, sleep with less difficulty from pain   OBJECTIVE  1RM TESTING Bent Over Row with contralateral UE braced (last tested 03/27/2023): Trial 1: 35# R: 1x10  L: 1x10   Trial 2: 65#   R: 1x5 (pt unable to do anymore)   L: 1x7 (pt unable to do anymore)  1 RM for Right Arm: 75.8 (~50% = 35#) 1 RM for Left Arm: 80.2 (~50% = 40#)   Bent Over Shoulder Scaption ("Y"):  (last tested 03/27/2023): Trial 1: 5# DB   R: 1x10    L: 1x10   Trial 2: 7# DB   R: 1x8 (pt unable to do anymore)   L: 1x9 (pt unable to do anymore)  1 RM for Right Arm: 8.9 (~50% = 4#) 1 RM for Left Arm: 9.1 (~50% = 4#)  TREATMENT                                                                                                                           -UBE x3 minutes, level 3 alternating direction Q 60wsec  -Prone with face cradle in flexion: palpation, dry needling and release to Rt rhomboids, Rt levator scap  -  supine Bilat pec minor P/ROM stretch 2x45sec, the Rt scapular posterior tilt stretch 1x45 sec  -Rt pec minor release, pin and stretch c Rt shoulder flexion 5x15sec  -wand flexion with sled bar 15x3secH  -standing cable low row [id number] -standing wand flexion with sled bar x10 (converted to overhead press after 7 due to shoulder flexion fatigue)    PATIENT EDUCATION:  Education details: Exercise purpose and form. Education on HEP including handout. Dry needling.  Person educated: Patient Education method: Explanation, Demonstration, Tactile Cues, Verbal Cues, and Handouts.  Education comprehension: verbalized understanding and needs further education  HOME EXERCISE PROGRAM: Access Code: [email or web address] URL: [email or web address] Date: 04/16/2023 Prepared by: Sydney [place]  Exercises - Dumbbell Squat at Shoulders  - 1 x daily - 2-3 x weekly - 2 sets - 20 reps - Seated Thoracic Lumbar Extension  - 1 x daily - 1 sets - 20 reps - 5 seconds hold - Standing Row with Anchored Resistance  - 3 x weekly - 2 sets - 20 reps - Resistance Pulldown with March  - 3 x weekly - 2 sets - 20 reps - Curator  - 3 x weekly - 2 sets - 1 min each arm carry time - Sitting Chest Press With Kettlebell  - 3 x weekly - 2 sets - 15 reps - Quadruped Cat Cow  - 1 x daily - 2 sets - 10 reps - Child's Pose with Sidebending  - 1 x daily - 2 sets - 30 hold - Quadruped Rock Back into [employer] Up  - 1 x daily - 1 sets - 10 reps - Prone Press Up  - 1 x daily - 2 sets - 10 reps - Standing Bent Over Single Arm Scapular Row with Table Support with PLB  - 3 x weekly - 2 sets - 15-20 reps - Single Arm Bent Over Shoulder Scaption with Dumbbell  - 3 x weekly -  2 sets - 15-20 reps - Sub-Occipital Cervical Stretch  - 1 x daily - 1 sets - 10 reps - 5 second hold  HOME EXERCISE PROGRAM [[email or web address]] View at [email or web address]" using code: [email or web address] Prayer Stretch -  Repeat 10 Repetitions, Hold 2 Seconds, Complete 2 Sets, Perform 3 Times a Day  HOME EXERCISE PROGRAM [[id number]] View at [email or web address] using code: [email or web address] Suboccipital Stretch and C1-C2 Mobilization -  Repeat 20 Repetitions, Hold 2 Seconds, Perform 3 Times a Day  6 Day Strength-Mobility Exercise Program  Determining how much weight to use with exercise:  If the goal of the exercise is strength and hypertrophy (muscle growth), choose a heavier weight that is very challenging by the end of 8-12 reps.   If the goal of the exercise is muscle endurance, choose a lighter weight that feels very challenging after 10-15 reps (up to 20 reps).  If you can do over 20-30 reps with a weight, you likely aren't challenging the muscles enough to make strength/endurance gains.   Repetition recommendations apply to reps that you can do with proper form. For example, if your form fails after 2-3 reps, only do 2-3 reps until you can do more with good form or reduce the weight you are using if possible.     Day 1  Circuits Exercise Sets Reps Rest Interval   Warm Up   Aerobic Warm Up: Walking   Quadruped Rock Back into [employer] Up 2 10    Cat/Cow 2 10    Seated Thoracic / Lumbar Extensions 1 20 (5 second  hold)    Main Workout   A1 Squat with dumbbells at shoulders 2 15 1  min  A2 Chest fly with resistance band  (anchored behind you) 2 15-20 1 min  1-2-minute rest following completion of circuit A  B1 Deadlifts with barbell 2 10-15 1 min  B2 Walking forward lunge with trunk rotation (holding dumbbell) 2 10  (each leg) 1 min  1-2-minute rest following completion of circuit B    Day 2  Circuits Exercise Sets Reps Rest Interval   Warm Up   Aerobic Warm Up: Walking   Radiographer, therapeutic onto railing 3 30 second hold  (each side)    Seated [employer] Rollouts 2 10    Supine [employer] on Exercise [place]  2 10    Main Workout   A1 Prone Y, T, A, Field Goals on Exercise Ball 2 10-15 (each motion) 1 min  A2 Suitcase Carry with dumbbell in one hand 2 1 min (each arm) 1 min  1-2-minute rest following completion of circuit A  B1 Kneeling lunge with trunk rotation against resistance band 2 15 1  min  B2 Sustained resistance band pull down with standing marches 2 20  (each leg) 1 min  1-2-minute rest following completion of circuit B  Day 3  Circuits Exercise Sets Reps Rest Interval   Warm Up   Aerobic Warm Up: Walking   Modified Prayer Stretch 3 30 second hold    Child's Pose with Side Bending 2 30 second hold  (each way)    Quadruped Rock Back into [employer] Up 2 10    Main Workout   A1 Bent over single arm row  (heavy weight) 3 8-10 2 min  A2 Standing I, Y, T, A with resistance band  (facing anchor) 2 10-15 1 min  1-2-minute rest following completion of circuit A  B1 Squat - Bicep Curl - Overhead Press  with dumbbells  2 10-15 1 min  B2 Standing row with anchored resistance 2 20 1  min  1-2-minute rest following completion of circuit B   Day 4  Circuits Exercise Sets Reps Rest Interval   Warm Up   Aerobic Warm Up: Walking   Seated Thoracic / Lumbar Extensions 1 20 (5 second hold)    Cat/Cow 2 10    Quadruped Thread the Needle (upper thoracic rotation) 2 10    Main Workout   A1 Chest Press on bench with dumbbells 2 15 1  min  A2 Squats with dumbbells on shoulders 2 10-15 1 min  1-2-minute rest following completion of circuit A  B1 Serratus Push Ups on forearms  (just move shoulder blades) 2 15-20 1 min  B2 Standing "Chops" with resistance band  (high-low & low-high) 2 15-20 (each direction) 1 min  1-2-minute rest following completion of circuit B    Day 5  Circuits Exercise Sets Reps Rest Interval   Warm Up   Aerobic Warm Up:  Walking   Modified Prayer Stretch 3 30 second hold    Seated 3-Way Ball Rollouts 2 10    Supine [employer] on Exercise Ball  2 10    Main Workout   A1 Squat - Bicep Curl - Overhead Press  with dumbbells  2 10-15 1 min  A2 Bent over single arm row  (heavy weight) 3 8-10 2 min  1-2-minute rest following completion of circuit A  B1 Deadlifts with barbell (heavy weight) 3 8-10 2 min  B2 Prone Y, T, A, Field Goals  on Exercise Ball 2 10-15 (each motion) 1 min  1-2-minute rest following completion of circuit B     Day 6  Circuits Exercise Sets Reps Rest Interval   Warm Up   Aerobic Warm Up: Walking   Crossbody Stretch holding onto railing 3 30 second hold  (each side)    Seated 3-Way Ball Rollouts 2 10    Quadruped Thread the Needle (upper thoracic rotation) 2 10    Main Workout    ASSESSMENT:  CLINICAL IMPRESSION: Symptoms improving over. Pt admits to minimal compliance with home program. Pt continues to have facet referral pain when in cervical extension, but able to be mediated with adjustment of face cradle when in prone. Targeted thoracic extension hypomobility today, as well as muscle trigger points, and strength impairments. Patient would benefit from continued management of limiting condition by skilled physical therapist to address remaining impairments and functional limitations to work towards stated goals and return to PLOF or maximal functional independence.   OBJECTIVE IMPAIRMENTS: decreased activity tolerance, decreased endurance, decreased knowledge of condition, decreased mobility, difficulty walking, decreased ROM, decreased strength, hypomobility, impaired perceived functional ability, increased muscle spasms, impaired flexibility, impaired UE functional use, postural dysfunction, and pain.   ACTIVITY LIMITATIONS: carrying, lifting, bending, sitting, standing, squatting, sleeping, transfers, bed mobility, bathing, dressing, hygiene/grooming, locomotion level, and  caring for others  PARTICIPATION LIMITATIONS: meal prep, cleaning, laundry, interpersonal relationship, driving, shopping, community activity, yard work, and everyday activities and tasks, working on projects, working on [employer], walking for exercise, sleeping, doing as much as he wants to   PERSONAL FACTORS: Age, Past/current experiences, Time since onset of injury/illness/exacerbation, and 3+ comorbidities: bilateral shoulder surgery (RTC repair, R most recent 12/2020), bilateral TKA, subarachnoid hemorrhage 2017 with SAH coiling, HTN, hx of hepatitis C (cured), double hernia repair, plantar facial fibromatosis, chronic pain syndrome, former smoker, Chronic bilateral low back pain with bilateral sciatica; Acute bursitis of right shoulder; Lumbar facet arthropathy; Lumbar degenerative disc disease; Localized osteoarthritis of shoulder regions, bilateral; Chronic SI joint pain; Chronic pain syndrome; Pain in joint of left shoulder; Abnormal gait; Anatomical narrow angle; Biomechanical lesion; Cataract nuclear; Derangement of posterior horn of medial meniscus; Edema; Glaucoma suspect of both eyes; Hip pain; History of total knee arthroplasty; Localized, primary osteoarthritis; Hx of subarachnoid hemophage, Muscle weakness; Arthritis; Plantar fascial fibromatosis; Viral hepatitis C; Arthropathy of left wrist; Chronic pain of left heel; Intercostal neuralgia; and Closed fracture of rib of left side with routine healing,  has a past medical history of Arthritis, Hepatitis, Hypertension, Subarachnoid hemorrhage (HCC) (2017), and Wears hearing aid in both ears.  has a past surgical history that includes Brain surgery; Joint replacement (Bilateral); Shoulder arthroscopy with rotator cuff repair and open biceps tenodesis (Left, 09/08/2018); and Hernia repair (1963). Also had surgery on right shoulder are also affecting patient's functional outcome.   REHAB POTENTIAL: Good  CLINICAL DECISION MAKING:  Evolving/moderate complexity  EVALUATION COMPLEXITY: Moderate   GOALS: Goals reviewed with patient? No  SHORT TERM GOALS: Target date: 03/20/2023  Patient will be independent with initial home exercise program for self-management of symptoms. Baseline: Initial HEP to be provided at visit 2 as appropriate (03/06/23); continued participation in HEP (04/18/23); Goal status: MET   LONG TERM GOALS: Target date: 05/29/2023  Patient will be independent with a long-term home exercise program for self-management of symptoms.  Baseline: Initial HEP to be provided at visit 2 as appropriate (03/06/23); continued participation in HEP (04/18/23); Goal status: In progress  2.  Patient will demonstrate improved FOTO to equal or greater than 56 by visit #13 to demonstrate improvement in overall condition and self-reported functional ability.  Baseline: 48/100 (03/06/23); 47/100 (04/18/23); Goal status: In progress  3.  Patient will demonstrate full thoracic AROM without provocation of thoracic spine pain and tightness to improve his ability to complete tasks such as walking, sweeping, and washing dishes with less difficulty. Baseline: Limitations with extension/flexion and concordant pulling sensation with lateral flexion (03/06/23); 46 degrees of flexion, 15 degrees of extension, continued limitations in ROM with later flexion but not painful with motion (04/18/23); Goal status: In progress  4.  Patient will demonstrate full cervical AROM without provocation of UT pain and thoracic spine tightness to improve ability to complete ADLs with less difficulty.  Baseline: Limited flexion and left side bending causing UT pain (03/06/23); 44 degrees and no pain with cervical flexion, 11 degrees of left side bending with tightness but no pain (04/18/23) Goal status: In progress  5.  Patient will demonstrate improvement in Patient Specific Functional Scale (PSFS) of equal or greater than 3 points to reflect clinically  significant improvement in patient's most valued functional activities. Baseline: To be tested at visit 2 as appropriate (03/06/23); 5/10 at visit #2 (03/13/2023); 7/10 at visit #10 (04/18/23); Goal status: In progress   PLAN:  PT FREQUENCY: 1-2x/week  PT DURATION: 8-12 weeks  PLANNED INTERVENTIONS: 97164- PT Re-evaluation, 97110-Therapeutic exercises, 97530- Therapeutic activity, 97112- Neuromuscular re-education, 97535- Self Care, 16109- Manual therapy, 97014- Electrical stimulation (unattended), Patient/Family education, Dry Needling, Joint mobilization, Spinal mobilization, Cryotherapy, and Moist heat.  PLAN FOR NEXT SESSION: update HEP as appropriate, progressive postural/UE/functional strengthening, core strengthening, manual therapy as needed, education.   9:08 AM, 04/30/23 Rosamaria Lints, PT, DPT Physical Therapist - [place] [phone] (Office)

## 2023-05-02 ENCOUNTER — Encounter: Payer: Self-pay | Admitting: Physical Therapy

## 2023-05-02 ENCOUNTER — Ambulatory Visit: Payer: Medicare Other | Admitting: Physical Therapy

## 2023-05-02 DIAGNOSIS — M546 Pain in thoracic spine: Secondary | ICD-10-CM

## 2023-05-02 DIAGNOSIS — M6281 Muscle weakness (generalized): Secondary | ICD-10-CM

## 2023-05-02 NOTE — Therapy (Signed)
 OUTPATIENT PHYSICAL THERAPY TREATMENT   Patient Name: Charles Mooney MRN: [id number] DOB:1956/01/22, 68 y.o., male Today's Date: 05/02/2023  END OF SESSION:  PT End of Session - 05/02/23 1027     Visit Number 14    Number of Visits 17    Date for PT Re-Evaluation 05/29/23    Authorization Type MEDICARE PART B reporting period from 03/06/23    Progress Note Due on Visit 20    PT Start Time 0904    PT Stop Time 0949    PT Time Calculation (min) 45 min    Activity Tolerance Patient tolerated treatment well    Behavior During Therapy [facility] for tasks assessed/performed               Past Medical History:  Diagnosis Date   Arthritis    Hepatitis    c 2000   Hypertension    Subarachnoid hemorrhage (HCC) 2017   Wears hearing aid in both ears    Past Surgical History:  Procedure Laterality Date   BRAIN SURGERY     [facility] COILING 2017   HERNIA REPAIR  1963   double hernia repair   JOINT REPLACEMENT Bilateral    TKR  L-2012, R-2015   SHOULDER ARTHROSCOPY WITH ROTATOR CUFF REPAIR AND OPEN BICEPS TENODESIS Left 09/08/2018   Procedure: LEFT SHOULDER ARTHROSCOPY,SUBSACAPULARIS REPAIR, SUBACROMIAL DECOMP, DISTAL CLAVICLE EXCISION,BICEP TENODESIS, MINI OPEN REGENTEN PATCH APPLICATION;  Surgeon: Signa Kell, MD;  Location: [facility] ORS;  Service: Orthopedics;  Laterality: Left;   Patient Active Problem List   Diagnosis Date Noted   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   Closed fracture of rib of left side with routine healing 09/04/2022   Chronic pain of left heel 08/31/2021   Arthropathy of left wrist 10/12/2020   Viral hepatitis C 11/02/2019   Lumbar facet arthropathy 04/27/2019   Lumbar degenerative disc disease 04/27/2019   Localized osteoarthritis of shoulder regions, bilateral 04/27/2019   Encounter for long-term opiate analgesic use 04/27/2019   Chronic SI joint pain 04/27/2019   Chronic pain syndrome 04/27/2019   Pain in joint of left shoulder 06/03/2018   Abnormal gait  06/18/2017   Anatomical narrow angle 06/18/2017   Derangement of posterior horn of medial meniscus 06/18/2017   Edema 06/18/2017   Hip pain 06/18/2017   History of total knee arthroplasty 06/18/2017   Localized, primary osteoarthritis 06/18/2017   Muscle weakness 06/18/2017   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   Acute bursitis of right shoulder 06/14/2017   Nonallopathic lesion of sacral region 06/14/2017   Nonallopathic lesion of thoracic region 06/14/2017   Nonallopathic lesion of lumbosacral region 06/14/2017   Biomechanical lesion 06/14/2017   Chronic bilateral low back pain with bilateral sciatica 05/23/2017   Arthritis 04/19/2013   Cataract nuclear 06/16/2012   Glaucoma suspect of both eyes 06/16/2012    PCP: Enid Baas, MD  REFERRING PROVIDER: Edward Jolly, MD  REFERRING DIAG: myofascial pain syndrome of thoracic spine  Rationale for Evaluation and Treatment: Rehabilitation  THERAPY DIAG:  Pain in thoracic spine  Muscle weakness (generalized)  ONSET DATE: October 2024 (approximately 3 months after rib fracture in July 2024)  SUBJECTIVE:  PERTINENT HISTORY:  Patient is a 68 y.o. male who presents to outpatient physical therapy with a referral for medical diagnosis of myofascial pain syndrome of thoracic spine. This patient's chief complaints consist of chronic right sided thoracic spine pain, right shoulder pain, and occasional paresthesia to the right UE, leading to the following functional deficits: limits him with everyday activities/tasks to some amount, working on projects, working on the computer, walking for exercise, sleeping, doing as much as he wants to.  Relevant past medical history and comorbidities include bilateral shoulder surgery (RTC repair, R most recent  12/2020) and subarachnoid hemorrhage in 2017. Patient denies hx of cancer, seizures, unexplained weight loss, unexplained changes in bowel or bladder problems, unexplained stumbling or dropping things, back surgery.    SUBJECTIVE STATEMENT: Patient states he is pretty good right now. He states the neck has been really extreme up at down from hour to hour. It will hurt like crazy or be very calm. Right now it is fairly calm and he has reasonable motion without increased pain. However, this morning it was much worse. The pain at the spot in his right thoracic spine is going up and down a little but it is staying pretty constant at 3-4/10, which is pretty good for him. Patient states his HEP has not been going well. He has not been able to get back to it after his trip to [place]. He is not sure why because he wants to do it.   PAIN: Are you having pain? Yes;  NPRS: 3/10 neck and 3-4/10 Rt medial scapular zone     FUNCTIONAL LIMITATIONS: limits him with everyday activities and tasks, working on projects, working on the computer, walking for exercise, sleeping, doing as much as he wants to  [employer]: working in the shop, building and fixing things  PRECAUTIONS: None   WEIGHT BEARING RESTRICTIONS: No   PLOF: Independent  PATIENT GOALS: to get rid of the pain, to get on an exercise and stretching regimen for his overall health, sleep with less difficulty from pain   OBJECTIVE  1RM TESTING Bent Over Row with contralateral UE braced (last tested 03/27/2023): Trial 1: 35# R: 1x10  L: 1x10   Trial 2: 65#   R: 1x5 (pt unable to do anymore)   L: 1x7 (pt unable to do anymore)  1 RM for Right Arm: 75.8 (~50% = 35#) 1 RM for Left Arm: 80.2 (~50% = 40#)   Bent Over Shoulder Scaption ("Y"):  (last tested 03/27/2023): Trial 1: 5# DB   R: 1x10    L: 1x10   Trial 2: 7# DB   R: 1x8 (pt unable to do anymore)   L: 1x9 (pt unable to do anymore)  1 RM for Right Arm: 8.9 (~50% = 4#) 1 RM for  Left Arm: 9.1 (~50% = 4#)  TREATMENT  Therapeutic exercise: therapeutic exercises that incorporate ONE parameter at one or more areas of the body to centralize symptoms, develop strength and endurance, range of motion, and flexibility required for successful completion of functional activities.  UBE x7 minutes, level 10 alternating directions every 1 minute.   (Manual therapy - see below)  Hooklying on half foam roll positioned along spine Snow angel circles 1x10 AROM circling from shoulder flexion to abduction 1x10 circling through abduction to flexion with 2#DB in each hand Pec stretch hold with shoulders at approx 120 degrees abduction 1x20-30 seconds  Hooklying thoracic extension self mobs over foam roll positioned perpendicular to the spine with hands behind head (added 2#DB held in cubital fossa of flexed elbows) 3-4x10 at several levels of the thoracic spine   Education on HEP including handout for foam roll stretches.   Neuromuscular Re-education: a technique or exercise performed with the goal of improving the level of communication between the body and the brain, such as for balance, motor control, muscle activation patterns, coordination, desensitization, quality of muscle contraction, proprioception, and/or kinesthetic sense needed for successful and safe completion of functional activities.   Education on improved posture to take pressure off of mid cervical spine.   Manual therapy: to reduce pain and tissue tension, improve range of motion, neuromodulation, in order to promote improved ability to complete functional activities. PRONE CPA to cervical spine, grade I-IV 1x30 seconds C2 grade II-IV 1x3-5 reps grade I-III at mid cervical spine Discontinued at tender segments 1x30 seconds grade III at C7 CPA to thoracic spine grade I-IV 2x30 seconds grade  III-IV T1 1x 3-5 reps grade I-III through tender segments T2-7 (with and without KE wedge) 1x20-30 seconds grade III-IV with KE wedge T8-T12    PATIENT EDUCATION:  Education details: Exercise purpose and form. Self management. Education on HEP including handout.  Person educated: Patient Education method: Explanation, Demonstration Education comprehension: verbalized understanding and needs further education  HOME EXERCISE PROGRAM: Access Code: [email or web address] URL: [email or web address] Date: 05/02/2023 Prepared by: Norton Blizzard  Exercises - Dumbbell Squat at Shoulders  - 1 x daily - 2-3 x weekly - 2 sets - 20 reps - Seated Thoracic Lumbar Extension  - 1 x daily - 1 sets - 20 reps - 5 seconds hold - Standing Row with Anchored Resistance  - 3 x weekly - 2 sets - 20 reps - Resistance Pulldown with March  - 3 x weekly - 2 sets - 20 reps - Curator  - 3 x weekly - 2 sets - 1 min each arm carry time - Sitting Chest Press With Kettlebell  - 3 x weekly - 2 sets - 15 reps - Quadruped Cat Cow  - 1 x daily - 2 sets - 10 reps - Child's Pose with Sidebending  - 1 x daily - 2 sets - 30 hold - Quadruped Rock Back into [employer] Up  - 1 x daily - 1 sets - 10 reps - Prone Press Up  - 1 x daily - 2 sets - 10 reps - Standing Bent Over Single Arm Scapular Row with Table Support with PLB  - 3 x weekly - 2 sets - 15-20 reps - Single Arm Bent Over Shoulder Scaption with Dumbbell  - 3 x weekly - 2 sets - 15-20 reps - Sub-Occipital Cervical Stretch  - 1 x daily - 1 sets - 10 reps - 5 second hold - Thoracic Stretch on Foam Roll - Hands Clasped  - 1-2 sets -  10 reps - [employer] on [employer]  - 1 x daily - 2 sets - 10 reps  HOME EXERCISE PROGRAM [[email or web address]] View at "[email or web address]" using code: [email or web address] Prayer Stretch -  Repeat 10 Repetitions, Hold 2 Seconds, Complete 2 Sets, Perform 3 Times a Day  HOME EXERCISE PROGRAM [[id number]] View at [email or web address] using code:  [id number] Suboccipital Stretch and C1-C2 Mobilization -  Repeat 20 Repetitions, Hold 2 Seconds, Perform 3 Times a Day  6 Day Strength-Mobility Exercise Program  Determining how much weight to use with exercise:  If the goal of the exercise is strength and hypertrophy (muscle growth), choose a heavier weight that is very challenging by the end of 8-12 reps.   If the goal of the exercise is muscle endurance, choose a lighter weight that feels very challenging after 10-15 reps (up to 20 reps).  If you can do over 20-30 reps with a weight, you likely aren't challenging the muscles enough to make strength/endurance gains.   Repetition recommendations apply to reps that you can do with proper form. For example, if your form fails after 2-3 reps, only do 2-3 reps until you can do more with good form or reduce the weight you are using if possible.     Day 1  Circuits Exercise Sets Reps Rest Interval   Warm Up   Aerobic Warm Up: Walking   Quadruped Rock Back into [employer] Up 2 10    Cat/Cow 2 10    Seated Thoracic / Lumbar Extensions 1 20 (5 second hold)    Main Workout   A1 Squat with dumbbells at shoulders 2 15 1  min  A2 Chest fly with resistance band  (anchored behind you) 2 15-20 1 min  1-2-minute rest following completion of circuit A  B1 Deadlifts with barbell 2 10-15 1 min  B2 Walking forward lunge with trunk rotation (holding dumbbell) 2 10  (each leg) 1 min  1-2-minute rest following completion of circuit B    Day 2  Circuits Exercise Sets Reps Rest Interval   Warm Up   Aerobic Warm Up: Walking   Orthoptist onto railing 3 30 second hold  (each side)    Seated [employer] Rollouts 2 10    Supine Snowangels on Exercise [place]  2 10    Main Workout   A1 Prone Y, T, A, Field Goals on Exercise Ball 2 10-15 (each motion) 1 min  A2 Suitcase Carry with dumbbell in one hand 2 1 min (each arm) 1 min  1-2-minute rest following completion of circuit A  B1  Kneeling lunge with trunk rotation against resistance band 2 15 1  min  B2 Sustained resistance band pull down with standing marches 2 20  (each leg) 1 min  1-2-minute rest following completion of circuit B  Day 3  Circuits Exercise Sets Reps Rest Interval   Warm Up   Aerobic Warm Up: Walking   Modified Prayer Stretch 3 30 second hold    Child's Pose with Side Bending 2 30 second hold  (each way)    Quadruped Rock Back into [employer] Up 2 10    Main Workout   A1 Bent over single arm row  (heavy weight) 3 8-10 2 min  A2 Standing I, Y, T, A with resistance band  (facing anchor) 2 10-15 1 min  1-2-minute rest following completion of circuit A  B1 Squat - Bicep Curl - Overhead Press  with dumbbells  2 10-15 1 min  B2 Standing row with anchored resistance 2 20 1  min  1-2-minute rest following completion of circuit B   Day 4  Circuits Exercise Sets Reps Rest Interval   Warm Up   Aerobic Warm Up: Walking   Seated Thoracic / Lumbar Extensions 1 20 (5 second hold)    Cat/Cow 2 10    Quadruped Thread the Needle (upper thoracic rotation) 2 10    Main Workout   A1 Chest Press on bench with dumbbells 2 15 1  min  A2 Squats with dumbbells on shoulders 2 10-15 1 min  1-2-minute rest following completion of circuit A  B1 Serratus Push Ups on forearms  (just move shoulder blades) 2 15-20 1 min  B2 Standing "Chops" with resistance band  (high-low & low-high) 2 15-20 (each direction) 1 min  1-2-minute rest following completion of circuit B    Day 5  Circuits Exercise Sets Reps Rest Interval   Warm Up   Aerobic Warm Up: Walking   Modified Prayer Stretch 3 30 second hold    Seated 3-Way Ball Rollouts 2 10    Supine Snowangels on Exercise Ball  2 10    Main Workout   A1 Squat - Bicep Curl - Overhead Press  with dumbbells  2 10-15 1 min  A2 Bent over single arm row  (heavy weight) 3 8-10 2 min  1-2-minute rest following completion of circuit A  B1 Deadlifts with  barbell (heavy weight) 3 8-10 2 min  B2 Prone Y, T, A, Field Goals on Exercise Ball 2 10-15 (each motion) 1 min  1-2-minute rest following completion of circuit B     Day 6  Circuits Exercise Sets Reps Rest Interval   Warm Up   Aerobic Warm Up: Walking   Crossbody Stretch holding onto railing 3 30 second hold  (each side)    Seated 3-Way Ball Rollouts 2 10    Quadruped Thread the Needle (upper thoracic rotation) 2 10    Main Workout    ASSESSMENT:  CLINICAL IMPRESSION: Patient arrives with improved pain. Continues to struggle to complete HEP but unsure why. Patient to try to write down barriers to HEP when he encounters them to improve awareness and help uncover effective problem solving strategies for behavior change. Today's session focused on mobilizing stiff segments above and below the neck that could be contributing to increased pressure on neck facet joints contributing to his neck and thoracic pain. Patient really liked the thoracic and chest stretches on the foam roll, so this was added to his HEP. He continues to demonstrate a lot of stiffness that is contributing to poor postures especially at the CT junction region. Plan to continue with interventions to improve mobility and postural strength at next session. Patient would benefit from continued management of limiting condition by skilled physical therapist to address remaining impairments and functional limitations to work towards stated goals and return to PLOF or maximal functional independence.  OBJECTIVE IMPAIRMENTS: decreased activity tolerance, decreased endurance, decreased knowledge of condition, decreased mobility, difficulty walking, decreased ROM, decreased strength, hypomobility, impaired perceived functional ability, increased muscle spasms, impaired flexibility, impaired UE functional use, postural dysfunction, and pain.   ACTIVITY LIMITATIONS: carrying, lifting, bending, sitting, standing, squatting,  sleeping, transfers, bed mobility, bathing, dressing, hygiene/grooming, locomotion level, and caring for others  PARTICIPATION LIMITATIONS: meal prep, cleaning, laundry, interpersonal relationship, driving, shopping, community activity, yard work, and everyday activities and tasks, working on projects, working on [employer], walking for exercise, sleeping, doing  as much as he wants to   PERSONAL FACTORS: Age, Past/current experiences, Time since onset of injury/illness/exacerbation, and 3+ comorbidities: bilateral shoulder surgery (RTC repair, R most recent 12/2020), bilateral TKA, subarachnoid hemorrhage 2017 with SAH coiling, HTN, hx of hepatitis C (cured), double hernia repair, plantar facial fibromatosis, chronic pain syndrome, former smoker, Chronic bilateral low back pain with bilateral sciatica; Acute bursitis of right shoulder; Lumbar facet arthropathy; Lumbar degenerative disc disease; Localized osteoarthritis of shoulder regions, bilateral; Chronic SI joint pain; Chronic pain syndrome; Pain in joint of left shoulder; Abnormal gait; Anatomical narrow angle; Biomechanical lesion; Cataract nuclear; Derangement of posterior horn of medial meniscus; Edema; Glaucoma suspect of both eyes; Hip pain; History of total knee arthroplasty; Localized, primary osteoarthritis; Hx of subarachnoid hemophage, Muscle weakness; Arthritis; Plantar fascial fibromatosis; Viral hepatitis C; Arthropathy of left wrist; Chronic pain of left heel; Intercostal neuralgia; and Closed fracture of rib of left side with routine healing,  has a past medical history of Arthritis, Hepatitis, Hypertension, Subarachnoid hemorrhage (HCC) (2017), and Wears hearing aid in both ears.  has a past surgical history that includes Brain surgery; Joint replacement (Bilateral); Shoulder arthroscopy with rotator cuff repair and open biceps tenodesis (Left, 09/08/2018); and Hernia repair (1963). Also had surgery on right shoulder are also affecting  patient's functional outcome.   REHAB POTENTIAL: Good  CLINICAL DECISION MAKING: Evolving/moderate complexity  EVALUATION COMPLEXITY: Moderate   GOALS: Goals reviewed with patient? No  SHORT TERM GOALS: Target date: 03/20/2023  Patient will be independent with initial home exercise program for self-management of symptoms. Baseline: Initial HEP to be provided at visit 2 as appropriate (03/06/23); continued participation in HEP (04/18/23); Goal status: MET   LONG TERM GOALS: Target date: 05/29/2023  Patient will be independent with a long-term home exercise program for self-management of symptoms.  Baseline: Initial HEP to be provided at visit 2 as appropriate (03/06/23); continued participation in HEP (04/18/23); Goal status: In progress  2.  Patient will demonstrate improved FOTO to equal or greater than 56 by visit #13 to demonstrate improvement in overall condition and self-reported functional ability.  Baseline: 48/100 (03/06/23); 47/100 (04/18/23); Goal status: In progress  3.  Patient will demonstrate full thoracic AROM without provocation of thoracic spine pain and tightness to improve his ability to complete tasks such as walking, sweeping, and washing dishes with less difficulty. Baseline: Limitations with extension/flexion and concordant pulling sensation with lateral flexion (03/06/23); 46 degrees of flexion, 15 degrees of extension, continued limitations in ROM with later flexion but not painful with motion (04/18/23); Goal status: In progress  4.  Patient will demonstrate full cervical AROM without provocation of UT pain and thoracic spine tightness to improve ability to complete ADLs with less difficulty.  Baseline: Limited flexion and left side bending causing UT pain (03/06/23); 44 degrees and no pain with cervical flexion, 11 degrees of left side bending with tightness but no pain (04/18/23) Goal status: In progress  5.  Patient will demonstrate improvement in Patient  Specific Functional Scale (PSFS) of equal or greater than 3 points to reflect clinically significant improvement in patient's most valued functional activities. Baseline: To be tested at visit 2 as appropriate (03/06/23); 5/10 at visit #2 (03/13/2023); 7/10 at visit #10 (04/18/23); Goal status: In progress   PLAN:  PT FREQUENCY: 1-2x/week  PT DURATION: 8-12 weeks  PLANNED INTERVENTIONS: 97164- PT Re-evaluation, 97110-Therapeutic exercises, 97530- Therapeutic activity, 97112- Neuromuscular re-education, 97535- Self Care, 60454- Manual therapy, 97014- Electrical stimulation (unattended), Patient/Family education, Dry Needling,  Joint mobilization, Spinal mobilization, Cryotherapy, and Moist heat.  PLAN FOR NEXT SESSION: update HEP as appropriate, progressive postural/UE/functional strengthening, core strengthening, manual therapy as needed, education.   Luretha Murphy. Ilsa Iha, PT, DPT 05/02/23, 2:29 PM  [facility] [address] [place], [place] [address] P: [phone] I F: [phone]

## 2023-05-07 ENCOUNTER — Ambulatory Visit: Payer: Medicare Other | Admitting: Physical Therapy

## 2023-05-07 ENCOUNTER — Encounter: Payer: Self-pay | Admitting: Physical Therapy

## 2023-05-07 DIAGNOSIS — M546 Pain in thoracic spine: Secondary | ICD-10-CM

## 2023-05-07 DIAGNOSIS — M6281 Muscle weakness (generalized): Secondary | ICD-10-CM

## 2023-05-07 NOTE — Therapy (Signed)
 OUTPATIENT PHYSICAL THERAPY TREATMENT   Patient Name: Charles Mooney MRN: [id number] DOB:[birth year], 68 y.o., male Today's Date: 05/07/2023  END OF SESSION:  PT End of Session - 05/07/23 1102     Visit Number 15    Number of Visits 17    Date for PT Re-Evaluation 05/29/23    Authorization Type MEDICARE PART B reporting period from 03/06/23    Progress Note Due on Visit 20    PT Start Time 0904    PT Stop Time 0952    PT Time Calculation (min) 48 min    Activity Tolerance Patient tolerated treatment well    Behavior During Therapy [facility] for tasks assessed/performed                Past Medical History:  Diagnosis Date   Arthritis    Hepatitis    c 2000   Hypertension    Subarachnoid hemorrhage (HCC) 2017   Wears hearing aid in both ears    Past Surgical History:  Procedure Laterality Date   BRAIN SURGERY     [facility] COILING 2017   HERNIA REPAIR  1963   double hernia repair   JOINT REPLACEMENT Bilateral    TKR  L-2012, R-2015   SHOULDER ARTHROSCOPY WITH ROTATOR CUFF REPAIR AND OPEN BICEPS TENODESIS Left 09/08/2018   Procedure: LEFT SHOULDER ARTHROSCOPY,SUBSACAPULARIS REPAIR, SUBACROMIAL DECOMP, DISTAL CLAVICLE EXCISION,BICEP TENODESIS, MINI OPEN REGENTEN PATCH APPLICATION;  Surgeon: Signa Kell, MD;  Location: [facility] ORS;  Service: Orthopedics;  Laterality: Left;   Patient Active Problem List   Diagnosis Date Noted   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   Closed fracture of rib of left side with routine healing 09/04/2022   Chronic pain of left heel 08/31/2021   Arthropathy of left wrist 10/12/2020   Viral hepatitis C 11/02/2019   Lumbar facet arthropathy 04/27/2019   Lumbar degenerative disc disease 04/27/2019   Localized osteoarthritis of shoulder regions, bilateral 04/27/2019   Encounter for long-term opiate analgesic use 04/27/2019   Chronic SI joint pain 04/27/2019   Chronic pain syndrome 04/27/2019   Pain in joint of left shoulder 06/03/2018   Abnormal gait  06/18/2017   Anatomical narrow angle 06/18/2017   Derangement of posterior horn of medial meniscus 06/18/2017   Edema 06/18/2017   Hip pain 06/18/2017   History of total knee arthroplasty 06/18/2017   Localized, primary osteoarthritis 06/18/2017   Muscle weakness 06/18/2017   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   Acute bursitis of right shoulder 06/14/2017   Nonallopathic lesion of sacral region 06/14/2017   Nonallopathic lesion of thoracic region 06/14/2017   Nonallopathic lesion of lumbosacral region 06/14/2017   Biomechanical lesion 06/14/2017   Chronic bilateral low back pain with bilateral sciatica 05/23/2017   Arthritis 04/19/2013   Cataract nuclear 06/16/2012   Glaucoma suspect of both eyes 06/16/2012    PCP: Enid Baas, MD  REFERRING PROVIDER: Edward Jolly, MD  REFERRING DIAG: myofascial pain syndrome of thoracic spine  Rationale for Evaluation and Treatment: Rehabilitation  THERAPY DIAG:  Pain in thoracic spine  Muscle weakness (generalized)  ONSET DATE: October 2024 (approximately 3 months after rib fracture in July 2024)  SUBJECTIVE:  PERTINENT HISTORY:  Patient is a 68 y.o. male who presents to outpatient physical therapy with a referral for medical diagnosis of myofascial pain syndrome of thoracic spine. This patient's chief complaints consist of chronic right sided thoracic spine pain, right shoulder pain, and occasional paresthesia to the right UE, leading to the following functional deficits: limits him with everyday activities/tasks to some amount, working on projects, working on the computer, walking for exercise, sleeping, doing as much as he wants to.  Relevant past medical history and comorbidities include bilateral shoulder surgery (RTC repair, R most recent  12/2020) and subarachnoid hemorrhage in 2017. Patient denies hx of cancer, seizures, unexplained weight loss, unexplained changes in bowel or bladder problems, unexplained stumbling or dropping things, back surgery.    SUBJECTIVE STATEMENT: Patient states he is "terrible." He woke up at 3am with a lot of neck pain. He used the heating pad and stretched and it felt better, but it is now terrible pain after driving to PT.  The neck feels worse when he lifts his head and better when he flexes it forwards. He felt good after last PT session. He did some stretches and exercises on Friday but Saturday and Sunday he was having a hard time. Yesterday was better than Sunday but still not very good. He has not been doing his fitness program.   PAIN: Are you having pain? Yes;  NPRS: 7/10 neck and 4/10 Rt medial scapular zone     FUNCTIONAL LIMITATIONS: limits him with everyday activities and tasks, working on projects, working on the computer, walking for exercise, sleeping, doing as much as he wants to  [employer]: working in the shop, building and fixing things  PRECAUTIONS: None   WEIGHT BEARING RESTRICTIONS: No   PLOF: Independent  PATIENT GOALS: to get rid of the pain, to get on an exercise and stretching regimen for his overall health, sleep with less difficulty from pain   OBJECTIVE  1RM TESTING Bent Over Row with contralateral UE braced (last tested 03/27/2023): Trial 1: 35# R: 1x10  L: 1x10   Trial 2: 65#   R: 1x5 (pt unable to do anymore)   L: 1x7 (pt unable to do anymore)  1 RM for Right Arm: 75.8 (~50% = 35#) 1 RM for Left Arm: 80.2 (~50% = 40#)   Bent Over Shoulder Scaption ("Y"):  (last tested 03/27/2023): Trial 1: 5# DB   R: 1x10    L: 1x10   Trial 2: 7# DB   R: 1x8 (pt unable to do anymore)   L: 1x9 (pt unable to do anymore)  1 RM for Right Arm: 8.9 (~50% = 4#) 1 RM for Left Arm: 9.1 (~50% = 4#)  TREATMENT                                                                                                                            Manual therapy: to reduce pain and tissue tension, improve range of motion, neuromodulation,  in order to promote improved ability to complete functional activities. PRONE CPA to cervical spine, grade I-IV Intermittently 1-10 seconds C2-C7 UPA to cervical spine, grade I-IV 1x10 5-10 seconds each side of C2 1x30 seconds grade III at C7 CPA to thoracic spine grade III-IV 1x30 seconds  each segment T2-T12 Lowest segments reproduce R periscapular pain CPA to lumbar spine grade III-IV 1x30 seconds each segment L1-L2 Reproduces R periscapular pain STM to cervical paraspinals, suboccipital muscles  HOOKLYING with half foam roller T1-Sacrum Distraction mobilization to cervical spine: 2x30 seconds  Neutral and opening mobilization bilaterally at several levels of cervical spine grade I-III Discontinued due to pain at lateral contact point  Cervical traction supporting head at suboccipitals/mastaoid process 3x30 seconds  STM to cervical paraspinals, suboccipital muscles   Flexion rotation test: appears grossly equal bilaterally and with expected range of motion for age and sex.   Trigger Point Dry Needling  Subsequent Treatment: Instructions provided previously at initial dry needling treatment.  Instructions reviewed, if requested by the patient, prior to subsequent dry needling treatment.  Patient Verbal Consent Given: Yes Education Handout Provided: Previously Provided Muscles Treated: 1 dry needle(s) .30mm x 30mm inserted with 1 sticks into right cervical multifidi muscles (too short) and 4 dry needle(s) .25mm x 40mm inserted with several sticks along bilateral cervical multifidi from C3 to C6 cervical multifidi muscles with patient in prone position to decrease pain and spasms along patient's neck region. Electrical Stimulation Performed: No Treatment Response/Outcome: Mild improvement in concordant neck pain, strongest deep  muscle ache at lower cervical spine. No abnormal responses noted.    PATIENT EDUCATION:  Education details: Exercise purpose and form. Self management. Education on HEP including handout.  Person educated: Patient Education method: Explanation, Demonstration Education comprehension: verbalized understanding and needs further education  HOME EXERCISE PROGRAM: Access Code: [email or web address] URL: [email or web address] Date: 05/02/2023 Prepared by: Norton Blizzard  Exercises - Dumbbell Squat at Shoulders  - 1 x daily - 2-3 x weekly - 2 sets - 20 reps - Seated Thoracic Lumbar Extension  - 1 x daily - 1 sets - 20 reps - 5 seconds hold - Standing Row with Anchored Resistance  - 3 x weekly - 2 sets - 20 reps - Resistance Pulldown with March  - 3 x weekly - 2 sets - 20 reps - Curator  - 3 x weekly - 2 sets - 1 min each arm carry time - Sitting Chest Press With Kettlebell  - 3 x weekly - 2 sets - 15 reps - Quadruped Cat Cow  - 1 x daily - 2 sets - 10 reps - Child's Pose with Sidebending  - 1 x daily - 2 sets - 30 hold - Quadruped Rock Back into [employer] Up  - 1 x daily - 1 sets - 10 reps - Prone Press Up  - 1 x daily - 2 sets - 10 reps - Standing Bent Over Single Arm Scapular Row with Table Support with PLB  - 3 x weekly - 2 sets - 15-20 reps - Single Arm Bent Over Shoulder Scaption with Dumbbell  - 3 x weekly - 2 sets - 15-20 reps - Sub-Occipital Cervical Stretch  - 1 x daily - 1 sets - 10 reps - 5 second hold - Thoracic Stretch on Foam Roll - Hands Clasped  - 1-2 sets - 10 reps - Snow Angels on Foam Roll  - 1 x daily - 2 sets - 10 reps  HOME EXERCISE PROGRAM [[email or web address]]  View at "[email or web address]" using code: [email or web address] Prayer Stretch -  Repeat 10 Repetitions, Hold 2 Seconds, Complete 2 Sets, Perform 3 Times a Day  HOME EXERCISE PROGRAM [[id number]] View at [email or web address] using code: [email or web address] Suboccipital Stretch and C1-C2 Mobilization -  Repeat 20 Repetitions,  Hold 2 Seconds, Perform 3 Times a Day  6 Day Strength-Mobility Exercise Program  Determining how much weight to use with exercise:  If the goal of the exercise is strength and hypertrophy (muscle growth), choose a heavier weight that is very challenging by the end of 8-12 reps.   If the goal of the exercise is muscle endurance, choose a lighter weight that feels very challenging after 10-15 reps (up to 20 reps).  If you can do over 20-30 reps with a weight, you likely aren't challenging the muscles enough to make strength/endurance gains.   Repetition recommendations apply to reps that you can do with proper form. For example, if your form fails after 2-3 reps, only do 2-3 reps until you can do more with good form or reduce the weight you are using if possible.     Day 1  Circuits Exercise Sets Reps Rest Interval   Warm Up   Aerobic Warm Up: Walking   Quadruped Rock Back into [employer] Up 2 10    Cat/Cow 2 10    Seated Thoracic / Lumbar Extensions 1 20 (5 second hold)    Main Workout   A1 Squat with dumbbells at shoulders 2 15 1  min  A2 Chest fly with resistance band  (anchored behind you) 2 15-20 1 min  1-2-minute rest following completion of circuit A  B1 Deadlifts with barbell 2 10-15 1 min  B2 Walking forward lunge with trunk rotation (holding dumbbell) 2 10  (each leg) 1 min  1-2-minute rest following completion of circuit B    Day 2  Circuits Exercise Sets Reps Rest Interval   Warm Up   Aerobic Warm Up: Walking   Orthoptist onto railing 3 30 second hold  (each side)    Seated [employer] Rollouts 2 10    Supine Snowangels on Exercise [place]  2 10    Main Workout   A1 Prone Y, T, A, Field Goals on Exercise Ball 2 10-15 (each motion) 1 min  A2 Suitcase Carry with dumbbell in one hand 2 1 min (each arm) 1 min  1-2-minute rest following completion of circuit A  B1 Kneeling lunge with trunk rotation against resistance band 2 15 1  min  B2  Sustained resistance band pull down with standing marches 2 20  (each leg) 1 min  1-2-minute rest following completion of circuit B  Day 3  Circuits Exercise Sets Reps Rest Interval   Warm Up   Aerobic Warm Up: Walking   Modified Prayer Stretch 3 30 second hold    Child's Pose with Side Bending 2 30 second hold  (each way)    Quadruped Rock Back into [employer] Up 2 10    Main Workout   A1 Bent over single arm row  (heavy weight) 3 8-10 2 min  A2 Standing I, Y, T, A with resistance band  (facing anchor) 2 10-15 1 min  1-2-minute rest following completion of circuit A  B1 Squat - Bicep Curl - Overhead Press  with dumbbells  2 10-15 1 min  B2 Standing row with anchored resistance 2 20 1  min  1-2-minute rest following completion of circuit B   Day 4  Circuits Exercise Sets Reps Rest Interval   Warm Up   Aerobic Warm Up: Walking   Seated Thoracic / Lumbar Extensions 1 20 (5 second hold)    Cat/Cow 2 10    Quadruped Thread the Needle (upper thoracic rotation) 2 10    Main Workout   A1 Chest Press on bench with dumbbells 2 15 1  min  A2 Squats with dumbbells on shoulders 2 10-15 1 min  1-2-minute rest following completion of circuit A  B1 Serratus Push Ups on forearms  (just move shoulder blades) 2 15-20 1 min  B2 Standing "Chops" with resistance band  (high-low & low-high) 2 15-20 (each direction) 1 min  1-2-minute rest following completion of circuit B    Day 5  Circuits Exercise Sets Reps Rest Interval   Warm Up   Aerobic Warm Up: Walking   Modified Prayer Stretch 3 30 second hold    Seated 3-Way Ball Rollouts 2 10    Supine Snowangels on Exercise Ball  2 10    Main Workout   A1 Squat - Bicep Curl - Overhead Press  with dumbbells  2 10-15 1 min  A2 Bent over single arm row  (heavy weight) 3 8-10 2 min  1-2-minute rest following completion of circuit A  B1 Deadlifts with barbell (heavy weight) 3 8-10 2 min  B2 Prone Y, T, A, Field Goals on Exercise  Ball 2 10-15 (each motion) 1 min  1-2-minute rest following completion of circuit B     Day 6  Circuits Exercise Sets Reps Rest Interval   Warm Up   Aerobic Warm Up: Walking   Crossbody Stretch holding onto railing 3 30 second hold  (each side)    Seated 3-Way Ball Rollouts 2 10    Quadruped Thread the Needle (upper thoracic rotation) 2 10    Main Workout    ASSESSMENT:  CLINICAL IMPRESSION: Patient with significantly irritated cervical spine pain. Today's session focused on interventions for pain control using dry needling and soft tissue approach as well as joint mobilizations to improve thoracic spine mobility and to attempt to decrease cervical spine pain (discontinued in cervical spine due to discomfort). Patient felt mildly better afterwards and was encouraged to continue his stretches and postural strengthening at home today as tolerated. Plan to re-assess fitness program next session. Patient would benefit from continued management of limiting condition by skilled physical therapist to address remaining impairments and functional limitations to work towards stated goals and return to PLOF or maximal functional independence.   OBJECTIVE IMPAIRMENTS: decreased activity tolerance, decreased endurance, decreased knowledge of condition, decreased mobility, difficulty walking, decreased ROM, decreased strength, hypomobility, impaired perceived functional ability, increased muscle spasms, impaired flexibility, impaired UE functional use, postural dysfunction, and pain.   ACTIVITY LIMITATIONS: carrying, lifting, bending, sitting, standing, squatting, sleeping, transfers, bed mobility, bathing, dressing, hygiene/grooming, locomotion level, and caring for others  PARTICIPATION LIMITATIONS: meal prep, cleaning, laundry, interpersonal relationship, driving, shopping, community activity, yard work, and everyday activities and tasks, working on projects, working on [employer], walking for  exercise, sleeping, doing as much as he wants to   PERSONAL FACTORS: Age, Past/current experiences, Time since onset of injury/illness/exacerbation, and 3+ comorbidities: bilateral shoulder surgery (RTC repair, R most recent 12/2020), bilateral TKA, subarachnoid hemorrhage 2017 with SAH coiling, HTN, hx of hepatitis C (cured), double hernia repair, plantar facial fibromatosis, chronic pain syndrome, former smoker, Chronic bilateral low back pain with bilateral sciatica; Acute bursitis of right shoulder; Lumbar facet arthropathy;  Lumbar degenerative disc disease; Localized osteoarthritis of shoulder regions, bilateral; Chronic SI joint pain; Chronic pain syndrome; Pain in joint of left shoulder; Abnormal gait; Anatomical narrow angle; Biomechanical lesion; Cataract nuclear; Derangement of posterior horn of medial meniscus; Edema; Glaucoma suspect of both eyes; Hip pain; History of total knee arthroplasty; Localized, primary osteoarthritis; Hx of subarachnoid hemophage, Muscle weakness; Arthritis; Plantar fascial fibromatosis; Viral hepatitis C; Arthropathy of left wrist; Chronic pain of left heel; Intercostal neuralgia; and Closed fracture of rib of left side with routine healing,  has a past medical history of Arthritis, Hepatitis, Hypertension, Subarachnoid hemorrhage (HCC) (2017), and Wears hearing aid in both ears.  has a past surgical history that includes Brain surgery; Joint replacement (Bilateral); Shoulder arthroscopy with rotator cuff repair and open biceps tenodesis (Left, 09/08/2018); and Hernia repair (1963). Also had surgery on right shoulder are also affecting patient's functional outcome.   REHAB POTENTIAL: Good  CLINICAL DECISION MAKING: Evolving/moderate complexity  EVALUATION COMPLEXITY: Moderate   GOALS: Goals reviewed with patient? No  SHORT TERM GOALS: Target date: 03/20/2023  Patient will be independent with initial home exercise program for self-management of  symptoms. Baseline: Initial HEP to be provided at visit 2 as appropriate (03/06/23); continued participation in HEP (04/18/23); Goal status: MET   LONG TERM GOALS: Target date: 05/29/2023  Patient will be independent with a long-term home exercise program for self-management of symptoms.  Baseline: Initial HEP to be provided at visit 2 as appropriate (03/06/23); continued participation in HEP (04/18/23); Goal status: In progress  2.  Patient will demonstrate improved FOTO to equal or greater than 56 by visit #13 to demonstrate improvement in overall condition and self-reported functional ability.  Baseline: 48/100 (03/06/23); 47/100 (04/18/23); Goal status: In progress  3.  Patient will demonstrate full thoracic AROM without provocation of thoracic spine pain and tightness to improve his ability to complete tasks such as walking, sweeping, and washing dishes with less difficulty. Baseline: Limitations with extension/flexion and concordant pulling sensation with lateral flexion (03/06/23); 46 degrees of flexion, 15 degrees of extension, continued limitations in ROM with later flexion but not painful with motion (04/18/23); Goal status: In progress  4.  Patient will demonstrate full cervical AROM without provocation of UT pain and thoracic spine tightness to improve ability to complete ADLs with less difficulty.  Baseline: Limited flexion and left side bending causing UT pain (03/06/23); 44 degrees and no pain with cervical flexion, 11 degrees of left side bending with tightness but no pain (04/18/23) Goal status: In progress  5.  Patient will demonstrate improvement in Patient Specific Functional Scale (PSFS) of equal or greater than 3 points to reflect clinically significant improvement in patient's most valued functional activities. Baseline: To be tested at visit 2 as appropriate (03/06/23); 5/10 at visit #2 (03/13/2023); 7/10 at visit #10 (04/18/23); Goal status: In progress   PLAN:  PT  FREQUENCY: 1-2x/week  PT DURATION: 8-12 weeks  PLANNED INTERVENTIONS: 97164- PT Re-evaluation, 97110-Therapeutic exercises, 97530- Therapeutic activity, 97112- Neuromuscular re-education, 97535- Self Care, 47425- Manual therapy, 97014- Electrical stimulation (unattended), Patient/Family education, Dry Needling, Joint mobilization, Spinal mobilization, Cryotherapy, and Moist heat.  PLAN FOR NEXT SESSION: update HEP as appropriate, progressive postural/UE/functional strengthening, core strengthening, manual therapy as needed, education.   Luretha Murphy. Ilsa Iha, PT, DPT 05/07/23, 11:13 AM  [facility] [address] [place], [place] [address] P: [phone] I F: [phone]

## 2023-05-09 ENCOUNTER — Ambulatory Visit: Payer: Medicare Other | Admitting: Physical Therapy

## 2023-05-14 ENCOUNTER — Encounter: Payer: Self-pay | Admitting: Physical Therapy

## 2023-05-14 ENCOUNTER — Ambulatory Visit: Payer: Medicare Other | Admitting: Physical Therapy

## 2023-05-14 DIAGNOSIS — M546 Pain in thoracic spine: Secondary | ICD-10-CM

## 2023-05-14 DIAGNOSIS — M6281 Muscle weakness (generalized): Secondary | ICD-10-CM

## 2023-05-14 NOTE — Therapy (Signed)
 OUTPATIENT PHYSICAL THERAPY TREATMENT   Patient Name: Charles Mooney MRN: [id number] DOB:12/08/1955, 68 y.o., male Today's Date: 05/14/2023  END OF SESSION:  PT End of Session - 05/14/23 2006     Visit Number 16    Number of Visits 17    Date for PT Re-Evaluation 05/29/23    Authorization Type MEDICARE PART B reporting period from 03/06/23    Progress Note Due on Visit 20    PT Start Time 0909    PT Stop Time 0949    PT Time Calculation (min) 40 min    Activity Tolerance Patient tolerated treatment well    Behavior During Therapy [facility] for tasks assessed/performed                 Past Medical History:  Diagnosis Date   Arthritis    Hepatitis    c 2000   Hypertension    Subarachnoid hemorrhage (HCC) 2017   Wears hearing aid in both ears    Past Surgical History:  Procedure Laterality Date   BRAIN SURGERY     [facility] COILING 2017   HERNIA REPAIR  1963   double hernia repair   JOINT REPLACEMENT Bilateral    TKR  L-2012, R-2015   SHOULDER ARTHROSCOPY WITH ROTATOR CUFF REPAIR AND OPEN BICEPS TENODESIS Left 09/08/2018   Procedure: LEFT SHOULDER ARTHROSCOPY,SUBSACAPULARIS REPAIR, SUBACROMIAL DECOMP, DISTAL CLAVICLE EXCISION,BICEP TENODESIS, MINI OPEN REGENTEN PATCH APPLICATION;  Surgeon: Signa Kell, MD;  Location: [facility] ORS;  Service: Orthopedics;  Laterality: Left;   Patient Active Problem List   Diagnosis Date Noted   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   Closed fracture of rib of left side with routine healing 09/04/2022   Chronic pain of left heel 08/31/2021   Arthropathy of left wrist 10/12/2020   Viral hepatitis C 11/02/2019   Lumbar facet arthropathy 04/27/2019   Lumbar degenerative disc disease 04/27/2019   Localized osteoarthritis of shoulder regions, bilateral 04/27/2019   Encounter for long-term opiate analgesic use 04/27/2019   Chronic SI joint pain 04/27/2019   Chronic pain syndrome 04/27/2019   Pain in joint of left shoulder 06/03/2018   Abnormal gait  06/18/2017   Anatomical narrow angle 06/18/2017   Derangement of posterior horn of medial meniscus 06/18/2017   Edema 06/18/2017   Hip pain 06/18/2017   History of total knee arthroplasty 06/18/2017   Localized, primary osteoarthritis 06/18/2017   Muscle weakness 06/18/2017   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   Acute bursitis of right shoulder 06/14/2017   Nonallopathic lesion of sacral region 06/14/2017   Nonallopathic lesion of thoracic region 06/14/2017   Nonallopathic lesion of lumbosacral region 06/14/2017   Biomechanical lesion 06/14/2017   Chronic bilateral low back pain with bilateral sciatica 05/23/2017   Arthritis 04/19/2013   Cataract nuclear 06/16/2012   Glaucoma suspect of both eyes 06/16/2012    PCP: Enid Baas, MD  REFERRING PROVIDER: Edward Jolly, MD  REFERRING DIAG: myofascial pain syndrome of thoracic spine  Rationale for Evaluation and Treatment: Rehabilitation  THERAPY DIAG:  Pain in thoracic spine  Muscle weakness (generalized)  ONSET DATE: October 2024 (approximately 3 months after rib fracture in July 2024)  SUBJECTIVE:  PERTINENT HISTORY:  Patient is a 68 y.o. male who presents to outpatient physical therapy with a referral for medical diagnosis of myofascial pain syndrome of thoracic spine. This patient's chief complaints consist of chronic right sided thoracic spine pain, right shoulder pain, and occasional paresthesia to the right UE, leading to the following functional deficits: limits him with everyday activities/tasks to some amount, working on projects, working on the computer, walking for exercise, sleeping, doing as much as he wants to. He never did get into a routine with the workout program.   Relevant past medical history and comorbidities  include bilateral shoulder surgery (RTC repair, R most recent 12/2020) and subarachnoid hemorrhage in 2017. Patient denies hx of cancer, seizures, unexplained weight loss, unexplained changes in bowel or bladder problems, unexplained stumbling or dropping things, back surgery.    SUBJECTIVE STATEMENT: Patient states he is feeing better after using a TENS unit for 3 hours turned up so high that it caused his upper traps to contract. He states his neck is feeling significantly better and his right periscapular region is feeling a little better. He has not been doing his HEP or his fitness routine and he is not sure why.   PAIN: Are you having pain? Yes;  NPRS: 3/10 neck and 5/10 Rt medial scapular zone     FUNCTIONAL LIMITATIONS: limits him with everyday activities and tasks, working on projects, working on the computer, walking for exercise, sleeping, doing as much as he wants to  [employer]: working in the shop, building and fixing things  PRECAUTIONS: None   WEIGHT BEARING RESTRICTIONS: No   PLOF: Independent  PATIENT GOALS: to get rid of the pain, to get on an exercise and stretching regimen for his overall health, sleep with less difficulty from pain   OBJECTIVE  1RM TESTING Bent Over Row with contralateral UE braced (last tested 03/27/2023): Trial 1: 35# R: 1x10  L: 1x10   Trial 2: 65#   R: 1x5 (pt unable to do anymore)   L: 1x7 (pt unable to do anymore)  1 RM for Right Arm: 75.8 (~50% = 35#) 1 RM for Left Arm: 80.2 (~50% = 40#)   Bent Over Shoulder Scaption ("Y"):  (last tested 03/27/2023): Trial 1: 5# DB   R: 1x10    L: 1x10   Trial 2: 7# DB   R: 1x8 (pt unable to do anymore)   L: 1x9 (pt unable to do anymore)  1 RM for Right Arm: 8.9 (~50% = 4#) 1 RM for Left Arm: 9.1 (~50% = 4#)  TREATMENT                                                                                                                           Therapeutic exercise: therapeutic exercises that  incorporate ONE parameter at one or more areas of the body to centralize symptoms, develop strength and endurance, range of motion, and flexibility required for successful completion of functional activities. Upper body  ergometer level 10 to encourage joint nutrition, warm tissue, induce analgesic effect of aerobic exercise, improve muscular strength and endurance,  and prepare for remainder of session. 5 min retrocycling.   Therapeutic activities: dynamic therapeutic activities incorporating MULTIPLE parameters or areas of the body designed to achieve improved functional performance.  Review with modifications of what was Day 5 of Fitness program to improve patient's form and assess  tolerance and appropriateness of each exercise (see final update in program under HEP):   Warm up:  Modified prayer stretch 1x20 with 5 second holds in standing position with hands on chest heigh bar Cuing to improve hip hinge and thoracic extension (pt was doing a squat and hanging from bar) with result of improved thoraic stretch as it is designd. (Pt does not have a surface to do kneeling version at).  Seated lumbar roll outs with theraball 1x10 forwards and each diagonal Supine snow angel on foam roller 1x20 Was originally programmed on theraball but when demonstrate din clinic, pt unable to hold his head in a neutral position, resulting in it hanging in excessive mid-cervical extension.   Main Work out:  Superset A: Squats with dumbbels on shoulders 2x15 with buttocks tap to 17 inch chair, 8# DB on each shoulder Min cuing required, but patient did better with reassurance of form and technique. Starting weight selected.  Half bent over single arm row 1x10 on right with 15#DB (top easy) 2x10 each side with 30#KB Patient prefers partially bent position due to feeling pulling in his back with fully horizontal position during trial during session.  Superset B: Barbell Deadlifts 2x15 with 40# added to 23# bar  propped on 17 inch chairs to mimic what he has been doing at home.  Minimal cuing needed once weight established Modified push ups  2x15 leaning on treadmill bar Cuing to find comparable surface at home Rest breaks removed and patient advised to take rests ad lib between exercises in a superset/circuit since the exercises were designed to work differnet muscle groups.   Patient educated in using this one template to perform fitness routine 3 days a week and go for a walk on 3 other days.    PATIENT EDUCATION:  Education details: Exercise purpose and form. Self management. Education on HEP including handout.  Person educated: Patient Education method: Explanation, Demonstration Education comprehension: verbalized understanding and needs further education  HOME EXERCISE PROGRAM: Access Code: [email or web address] URL: [email or web address] Date: 05/02/2023 Prepared by: Norton Blizzard  Exercises - Dumbbell Squat at Shoulders  - 1 x daily - 2-3 x weekly - 2 sets - 20 reps - Seated Thoracic Lumbar Extension  - 1 x daily - 1 sets - 20 reps - 5 seconds hold - Standing Row with Anchored Resistance  - 3 x weekly - 2 sets - 20 reps - Resistance Pulldown with March  - 3 x weekly - 2 sets - 20 reps - Curator  - 3 x weekly - 2 sets - 1 min each arm carry time - Sitting Chest Press With Kettlebell  - 3 x weekly - 2 sets - 15 reps - Quadruped Cat Cow  - 1 x daily - 2 sets - 10 reps - Child's Pose with Sidebending  - 1 x daily - 2 sets - 30 hold - Quadruped Rock Back into [employer] Up  - 1 x daily - 1 sets - 10 reps - Prone Press Up  - 1 x daily - 2 sets - 10 reps -  Standing Bent Over Single Arm Scapular Row with Table Support with PLB  - 3 x weekly - 2 sets - 15-20 reps - Single Arm Bent Over Shoulder Scaption with Dumbbell  - 3 x weekly - 2 sets - 15-20 reps - Sub-Occipital Cervical Stretch  - 1 x daily - 1 sets - 10 reps - 5 second hold - Thoracic Stretch on Foam Roll - Hands  Clasped  - 1-2 sets - 10 reps - Snow Angels on Foam Roll  - 1 x daily - 2 sets - 10 reps  HOME EXERCISE PROGRAM [[email or web address]] View at [email or web address]" using code: [email or web address] Prayer Stretch -  Repeat 10 Repetitions, Hold 2 Seconds, Complete 2 Sets, Perform 3 Times a Day  HOME EXERCISE PROGRAM [[id number]] View at [email or web address] using code: [email or web address] Suboccipital Stretch and C1-C2 Mobilization -  Repeat 20 Repetitions, Hold 2 Seconds, Perform 3 Times a Day  6 Day Strength-Mobility Exercise Program  Determining how much weight to use with exercise:  If the goal of the exercise is strength and hypertrophy (muscle growth), choose a heavier weight that is very challenging by the end of 8-12 reps.   If the goal of the exercise is muscle endurance, choose a lighter weight that feels very challenging after 10-15 reps (up to 20 reps).  If you can do over 20-30 reps with a weight, you likely aren't challenging the muscles enough to make strength/endurance gains.   Repetition recommendations apply to reps that you can do with proper form. For example, if your form fails after 2-3 reps, only do 2-3 reps until you can do more with good form or reduce the weight you are using if possible.    3 Non-Consecutive Days Per Week  Circuits Exercise Sets Reps Rest Interval   Warm Up (can go through twice if needed)   Aerobic Warm Up: Walking (optional)   Modified Prayer Stretch 20 5 second hold    Squats (no weight) 1 10    Supine Snow angels on Foam Roll (head supported)  1 20    Main Workout   A1 Squats with dumbbells on shoulders (8# DBs) 2-3 15   A2 Bent over single arm row  (>=30#) 2-3 8-10   1-2-minute rest following completion of circuit A if desired  B1 Deadlifts with barbell (40# added to bar) 2-3 15   B2 Push ups (modified to surface) 2-3 15   Any stretches you prefer would be great following circuit B   ASSESSMENT:  CLINICAL IMPRESSION: Patient presents with improved pain  after using TENS until turned up high enough to cause muscle contractions but not performing HEP or fitness program as patient wishes. Today's session focused on reviewing and revising fitness program to see patient actually complete all exercises in clinic and make any adjustments needed. This was a useful task, as patient needed cuing to improve form and make postural changes that will help reduce irritation to the neck. Patient did report mild increase in neck and thoracic spine symptoms by end of session. Plan to re-assess tolerance and make changes as needed next session. Patient would benefit from continued management of limiting condition by skilled physical therapist to address remaining impairments and functional limitations to work towards stated goals and return to PLOF or maximal functional independence.    OBJECTIVE IMPAIRMENTS: decreased activity tolerance, decreased endurance, decreased knowledge of condition, decreased mobility, difficulty walking, decreased ROM, decreased strength, hypomobility, impaired perceived functional ability, increased muscle spasms, impaired flexibility, impaired UE functional use,  postural dysfunction, and pain.   ACTIVITY LIMITATIONS: carrying, lifting, bending, sitting, standing, squatting, sleeping, transfers, bed mobility, bathing, dressing, hygiene/grooming, locomotion level, and caring for others  PARTICIPATION LIMITATIONS: meal prep, cleaning, laundry, interpersonal relationship, driving, shopping, community activity, yard work, and everyday activities and tasks, working on projects, working on [employer], walking for exercise, sleeping, doing as much as he wants to   PERSONAL FACTORS: Age, Past/current experiences, Time since onset of injury/illness/exacerbation, and 3+ comorbidities: bilateral shoulder surgery (RTC repair, R most recent 12/2020), bilateral TKA, subarachnoid hemorrhage 2017 with SAH coiling, HTN, hx of hepatitis C (cured), double hernia  repair, plantar facial fibromatosis, chronic pain syndrome, former smoker, Chronic bilateral low back pain with bilateral sciatica; Acute bursitis of right shoulder; Lumbar facet arthropathy; Lumbar degenerative disc disease; Localized osteoarthritis of shoulder regions, bilateral; Chronic SI joint pain; Chronic pain syndrome; Pain in joint of left shoulder; Abnormal gait; Anatomical narrow angle; Biomechanical lesion; Cataract nuclear; Derangement of posterior horn of medial meniscus; Edema; Glaucoma suspect of both eyes; Hip pain; History of total knee arthroplasty; Localized, primary osteoarthritis; Hx of subarachnoid hemophage, Muscle weakness; Arthritis; Plantar fascial fibromatosis; Viral hepatitis C; Arthropathy of left wrist; Chronic pain of left heel; Intercostal neuralgia; and Closed fracture of rib of left side with routine healing,  has a past medical history of Arthritis, Hepatitis, Hypertension, Subarachnoid hemorrhage (HCC) (2017), and Wears hearing aid in both ears.  has a past surgical history that includes Brain surgery; Joint replacement (Bilateral); Shoulder arthroscopy with rotator cuff repair and open biceps tenodesis (Left, 09/08/2018); and Hernia repair (1963). Also had surgery on right shoulder are also affecting patient's functional outcome.   REHAB POTENTIAL: Good  CLINICAL DECISION MAKING: Evolving/moderate complexity  EVALUATION COMPLEXITY: Moderate   GOALS: Goals reviewed with patient? No  SHORT TERM GOALS: Target date: 03/20/2023  Patient will be independent with initial home exercise program for self-management of symptoms. Baseline: Initial HEP to be provided at visit 2 as appropriate (03/06/23); continued participation in HEP (04/18/23); Goal status: MET   LONG TERM GOALS: Target date: 05/29/2023  Patient will be independent with a long-term home exercise program for self-management of symptoms.  Baseline: Initial HEP to be provided at visit 2 as appropriate  (03/06/23); continued participation in HEP (04/18/23); Goal status: In progress  2.  Patient will demonstrate improved FOTO to equal or greater than 56 by visit #13 to demonstrate improvement in overall condition and self-reported functional ability.  Baseline: 48/100 (03/06/23); 47/100 (04/18/23); Goal status: In progress  3.  Patient will demonstrate full thoracic AROM without provocation of thoracic spine pain and tightness to improve his ability to complete tasks such as walking, sweeping, and washing dishes with less difficulty. Baseline: Limitations with extension/flexion and concordant pulling sensation with lateral flexion (03/06/23); 46 degrees of flexion, 15 degrees of extension, continued limitations in ROM with later flexion but not painful with motion (04/18/23); Goal status: In progress  4.  Patient will demonstrate full cervical AROM without provocation of UT pain and thoracic spine tightness to improve ability to complete ADLs with less difficulty.  Baseline: Limited flexion and left side bending causing UT pain (03/06/23); 44 degrees and no pain with cervical flexion, 11 degrees of left side bending with tightness but no pain (04/18/23) Goal status: In progress  5.  Patient will demonstrate improvement in Patient Specific Functional Scale (PSFS) of equal or greater than 3 points to reflect clinically significant improvement in patient's most valued functional activities. Baseline: To be tested at  visit 2 as appropriate (03/06/23); 5/10 at visit #2 (03/13/2023); 7/10 at visit #10 (04/18/23); Goal status: In progress   PLAN:  PT FREQUENCY: 1-2x/week  PT DURATION: 8-12 weeks  PLANNED INTERVENTIONS: 97164- PT Re-evaluation, 97110-Therapeutic exercises, 97530- Therapeutic activity, 97112- Neuromuscular re-education, 97535- Self Care, 16109- Manual therapy, 97014- Electrical stimulation (unattended), Patient/Family education, Dry Needling, Joint mobilization, Spinal mobilization,  Cryotherapy, and Moist heat.  PLAN FOR NEXT SESSION: update HEP as appropriate, progressive postural/UE/functional strengthening, core strengthening, manual therapy as needed, education.   Luretha Murphy. Ilsa Iha, PT, DPT 05/14/23, 8:21 PM  [facility] [address] [place], [place] [address] P: [phone] I F: [phone]

## 2023-05-16 ENCOUNTER — Ambulatory Visit: Payer: Medicare Other | Admitting: Physical Therapy

## 2023-05-16 ENCOUNTER — Encounter: Payer: Self-pay | Admitting: Physical Therapy

## 2023-05-16 DIAGNOSIS — M546 Pain in thoracic spine: Secondary | ICD-10-CM | POA: Diagnosis not present

## 2023-05-16 DIAGNOSIS — M6281 Muscle weakness (generalized): Secondary | ICD-10-CM

## 2023-05-16 NOTE — Therapy (Unsigned)
 OUTPATIENT PHYSICAL THERAPY TREATMENT   Patient Name: Charles Mooney MRN: [id number] DOB:28-Mar-1955, 68 y.o., male Today's Date: 05/16/2023  END OF SESSION:  PT End of Session - 05/16/23 2023     Visit Number 17    Number of Visits 20    Date for PT Re-Evaluation 05/29/23    Authorization Type MEDICARE PART B reporting period from 03/06/23    Progress Note Due on Visit 20    PT Start Time 0949    PT Stop Time 1030    PT Time Calculation (min) 41 min    Activity Tolerance Patient tolerated treatment well    Behavior During Therapy [facility] for tasks assessed/performed                  Past Medical History:  Diagnosis Date   Arthritis    Hepatitis    c 2000   Hypertension    Subarachnoid hemorrhage (HCC) 2017   Wears hearing aid in both ears    Past Surgical History:  Procedure Laterality Date   BRAIN SURGERY     [facility] COILING 2017   HERNIA REPAIR  1963   double hernia repair   JOINT REPLACEMENT Bilateral    TKR  L-2012, R-2015   SHOULDER ARTHROSCOPY WITH ROTATOR CUFF REPAIR AND OPEN BICEPS TENODESIS Left 09/08/2018   Procedure: LEFT SHOULDER ARTHROSCOPY,SUBSACAPULARIS REPAIR, SUBACROMIAL DECOMP, DISTAL CLAVICLE EXCISION,BICEP TENODESIS, MINI OPEN REGENTEN PATCH APPLICATION;  Surgeon: Signa Kell, MD;  Location: [facility] ORS;  Service: Orthopedics;  Laterality: Left;   Patient Active Problem List   Diagnosis Date Noted   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   Closed fracture of rib of left side with routine healing 09/04/2022   Chronic pain of left heel 08/31/2021   Arthropathy of left wrist 10/12/2020   Viral hepatitis C 11/02/2019   Lumbar facet arthropathy 04/27/2019   Lumbar degenerative disc disease 04/27/2019   Localized osteoarthritis of shoulder regions, bilateral 04/27/2019   Encounter for long-term opiate analgesic use 04/27/2019   Chronic SI joint pain 04/27/2019   Chronic pain syndrome 04/27/2019   Pain in joint of left shoulder 06/03/2018   Abnormal gait  06/18/2017   Anatomical narrow angle 06/18/2017   Derangement of posterior horn of medial meniscus 06/18/2017   Edema 06/18/2017   Hip pain 06/18/2017   History of total knee arthroplasty 06/18/2017   Localized, primary osteoarthritis 06/18/2017   Muscle weakness 06/18/2017   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   Acute bursitis of right shoulder 06/14/2017   Nonallopathic lesion of sacral region 06/14/2017   Nonallopathic lesion of thoracic region 06/14/2017   Nonallopathic lesion of lumbosacral region 06/14/2017   Biomechanical lesion 06/14/2017   Chronic bilateral low back pain with bilateral sciatica 05/23/2017   Arthritis 04/19/2013   Cataract nuclear 06/16/2012   Glaucoma suspect of both eyes 06/16/2012    PCP: Enid Baas, MD  REFERRING PROVIDER: Edward Jolly, MD  REFERRING DIAG: myofascial pain syndrome of thoracic spine  Rationale for Evaluation and Treatment: Rehabilitation  THERAPY DIAG:  Pain in thoracic spine  Muscle weakness (generalized)  ONSET DATE: October 2024 (approximately 3 months after rib fracture in July 2024)  SUBJECTIVE:  PERTINENT HISTORY:  Patient is a 68 y.o. male who presents to outpatient physical therapy with a referral for medical diagnosis of myofascial pain syndrome of thoracic spine. This patient's chief complaints consist of chronic right sided thoracic spine pain, right shoulder pain, and occasional paresthesia to the right UE, leading to the following functional deficits: limits him with everyday activities/tasks to some amount, working on projects, working on the computer, walking for exercise, sleeping, doing as much as he wants to. He never did get into a routine with the workout program.   Relevant past medical history and comorbidities  include bilateral shoulder surgery (RTC repair, R most recent 12/2020) and subarachnoid hemorrhage in 2017. Patient denies hx of cancer, seizures, unexplained weight loss, unexplained changes in bowel or bladder problems, unexplained stumbling or dropping things, back surgery.    SUBJECTIVE STATEMENT: Patient states he is feeling better overall and after last PT session. He used his TENS unit some again yesterday to help control pain.   PAIN: Are you having pain? Yes;  NPRS: not recorded for neck and  Rt medial scapular zone     FUNCTIONAL LIMITATIONS: limits him with everyday activities and tasks, working on projects, working on the computer, walking for exercise, sleeping, doing as much as he wants to  [employer]: working in the shop, building and fixing things  PRECAUTIONS: None   WEIGHT BEARING RESTRICTIONS: No   PLOF: Independent  PATIENT GOALS: to get rid of the pain, to get on an exercise and stretching regimen for his overall health, sleep with less difficulty from pain   OBJECTIVE  1RM TESTING Bent Over Row with contralateral UE braced (last tested 03/27/2023): Trial 1: 35# R: 1x10  L: 1x10   Trial 2: 65#   R: 1x5 (pt unable to do anymore)   L: 1x7 (pt unable to do anymore)  1 RM for Right Arm: 75.8 (~50% = 35#) 1 RM for Left Arm: 80.2 (~50% = 40#)   Bent Over Shoulder Scaption ("Y"):  (last tested 03/27/2023): Trial 1: 5# DB   R: 1x10    L: 1x10   Trial 2: 7# DB   R: 1x8 (pt unable to do anymore)   L: 1x9 (pt unable to do anymore)  1 RM for Right Arm: 8.9 (~50% = 4#) 1 RM for Left Arm: 9.1 (~50% = 4#)  TREATMENT                                                                                                                           Therapeutic exercise: therapeutic exercises that incorporate ONE parameter at one or more areas of the body to centralize symptoms, develop strength and endurance, range of motion, and flexibility required for successful  completion of functional activities.  Half kneeling single arm pull down/lat pull with right lateral trunk bend 2x15 each side with 20# cable  Manual therapy: to reduce pain and tissue tension, improve range of motion, neuromodulation, in order to promote  improved ability to complete functional activities.    Trigger Point Dry Needling  Subsequent Treatment: Instructions provided previously at initial dry needling treatment.  Instructions reviewed, if requested by the patient, prior to subsequent dry needling treatment.  Patient Verbal Consent Given: Yes Education Handout Provided: Previously Provided Muscles Treated: 2 dry needle(s) .25mm x 40mm inserted with multiple sticks into bilateral UT muscles with patient in prone position to decrease pain and spasms along patient's neck and upper back region. Electrical Stimulation Performed: No Treatment Response/Outcome: Patient with lots of twitch responses in bilateral UT. R UT more bulky and in spasm to palpation than left prior to needling. Palpable relaxation in taut bands bilaterally after needling. Patient reported he had decreased concordant pain by end of session but some soreness in B UT. He verbalized that he found dry needling quite uncomfortable but was able to stay calm and still. No adverse or unexpected responses noted.     PATIENT EDUCATION:  Education details: Exercise purpose and form. Self management. Education on HEP including handout.  Person educated: Patient Education method: Explanation, Demonstration Education comprehension: verbalized understanding and needs further education  HOME EXERCISE PROGRAM: Access Code: [email or web address] URL: [email or web address] Date: 05/02/2023 Prepared by: Norton Blizzard  Exercises - Dumbbell Squat at Shoulders  - 1 x daily - 2-3 x weekly - 2 sets - 20 reps - Seated Thoracic Lumbar Extension  - 1 x daily - 1 sets - 20 reps - 5 seconds hold - Standing Row with Anchored  Resistance  - 3 x weekly - 2 sets - 20 reps - Resistance Pulldown with March  - 3 x weekly - 2 sets - 20 reps - Curator  - 3 x weekly - 2 sets - 1 min each arm carry time - Sitting Chest Press With Kettlebell  - 3 x weekly - 2 sets - 15 reps - Quadruped Cat Cow  - 1 x daily - 2 sets - 10 reps - Child's Pose with Sidebending  - 1 x daily - 2 sets - 30 hold - Quadruped Rock Back into [employer] Up  - 1 x daily - 1 sets - 10 reps - Prone Press Up  - 1 x daily - 2 sets - 10 reps - Standing Bent Over Single Arm Scapular Row with Table Support with PLB  - 3 x weekly - 2 sets - 15-20 reps - Single Arm Bent Over Shoulder Scaption with Dumbbell  - 3 x weekly - 2 sets - 15-20 reps - Sub-Occipital Cervical Stretch  - 1 x daily - 1 sets - 10 reps - 5 second hold - Thoracic Stretch on Foam Roll - Hands Clasped  - 1-2 sets - 10 reps - Snow Angels on Foam Roll  - 1 x daily - 2 sets - 10 reps  HOME EXERCISE PROGRAM [[email or web address]] View at [email or web address]" using code: [email or web address] Prayer Stretch -  Repeat 10 Repetitions, Hold 2 Seconds, Complete 2 Sets, Perform 3 Times a Day  HOME EXERCISE PROGRAM [[id number]] View at [email or web address] using code: [id number] Suboccipital Stretch and C1-C2 Mobilization -  Repeat 20 Repetitions, Hold 2 Seconds, Perform 3 Times a Day  6 Day Strength-Mobility Exercise Program  Determining how much weight to use with exercise:  If the goal of the exercise is strength and hypertrophy (muscle growth), choose a heavier weight that is very challenging by the end of 8-12 reps.   If the goal of the exercise is muscle endurance, choose a lighter weight that feels  very challenging after 10-15 reps (up to 20 reps).  If you can do over 20-30 reps with a weight, you likely aren't challenging the muscles enough to make strength/endurance gains.   Repetition recommendations apply to reps that you can do with proper form. For example, if your form fails after 2-3 reps,  only do 2-3 reps until you can do more with good form or reduce the weight you are using if possible.    3 Non-Consecutive Days Per Week  Circuits Exercise Sets Reps Rest Interval   Warm Up (can go through twice if needed)   Aerobic Warm Up: Walking (optional)   Modified Prayer Stretch 20 5 second hold    Squats (no weight) 1 10    Supine Snow angels on Foam Roll (head supported)  1 20    Main Workout   A1 Squats with dumbbells on shoulders (8# DBs) 2-3 15   A2 Bent over single arm row  (>=30#) 2-3 8-10   1-2-minute rest following completion of circuit A if desired  B1 Deadlifts with barbell (40# added to bar) 2-3 15   B2 Push ups (modified to surface) 2-3 15   Any stretches you prefer would be great following circuit B   ASSESSMENT:  CLINICAL IMPRESSION: Found dry needling to be very intense but felt better afterwards.   Half kneeling lat puls, 2x15 each side with 20# PROM UT stretch 3x60 seconds each sdie    OBJECTIVE IMPAIRMENTS: decreased activity tolerance, decreased endurance, decreased knowledge of condition, decreased mobility, difficulty walking, decreased ROM, decreased strength, hypomobility, impaired perceived functional ability, increased muscle spasms, impaired flexibility, impaired UE functional use, postural dysfunction, and pain.   ACTIVITY LIMITATIONS: carrying, lifting, bending, sitting, standing, squatting, sleeping, transfers, bed mobility, bathing, dressing, hygiene/grooming, locomotion level, and caring for others  PARTICIPATION LIMITATIONS: meal prep, cleaning, laundry, interpersonal relationship, driving, shopping, community activity, yard work, and everyday activities and tasks, working on projects, working on [employer], walking for exercise, sleeping, doing as much as he wants to   PERSONAL FACTORS: Age, Past/current experiences, Time since onset of injury/illness/exacerbation, and 3+ comorbidities: bilateral shoulder surgery (RTC repair, R most  recent 12/2020), bilateral TKA, subarachnoid hemorrhage 2017 with SAH coiling, HTN, hx of hepatitis C (cured), double hernia repair, plantar facial fibromatosis, chronic pain syndrome, former smoker, Chronic bilateral low back pain with bilateral sciatica; Acute bursitis of right shoulder; Lumbar facet arthropathy; Lumbar degenerative disc disease; Localized osteoarthritis of shoulder regions, bilateral; Chronic SI joint pain; Chronic pain syndrome; Pain in joint of left shoulder; Abnormal gait; Anatomical narrow angle; Biomechanical lesion; Cataract nuclear; Derangement of posterior horn of medial meniscus; Edema; Glaucoma suspect of both eyes; Hip pain; History of total knee arthroplasty; Localized, primary osteoarthritis; Hx of subarachnoid hemophage, Muscle weakness; Arthritis; Plantar fascial fibromatosis; Viral hepatitis C; Arthropathy of left wrist; Chronic pain of left heel; Intercostal neuralgia; and Closed fracture of rib of left side with routine healing,  has a past medical history of Arthritis, Hepatitis, Hypertension, Subarachnoid hemorrhage (HCC) (2017), and Wears hearing aid in both ears.  has a past surgical history that includes Brain surgery; Joint replacement (Bilateral); Shoulder arthroscopy with rotator cuff repair and open biceps tenodesis (Left, 09/08/2018); and Hernia repair (1963). Also had surgery on right shoulder are also affecting patient's functional outcome.   REHAB POTENTIAL: Good  CLINICAL DECISION MAKING: Evolving/moderate complexity  EVALUATION COMPLEXITY: Moderate   GOALS: Goals reviewed with patient? No  SHORT TERM GOALS: Target date: 03/20/2023  Patient will be  independent with initial home exercise program for self-management of symptoms. Baseline: Initial HEP to be provided at visit 2 as appropriate (03/06/23); continued participation in HEP (04/18/23); Goal status: MET   LONG TERM GOALS: Target date: 05/29/2023  Patient will be independent with a long-term  home exercise program for self-management of symptoms.  Baseline: Initial HEP to be provided at visit 2 as appropriate (03/06/23); continued participation in HEP (04/18/23); Goal status: In progress  2.  Patient will demonstrate improved FOTO to equal or greater than 56 by visit #13 to demonstrate improvement in overall condition and self-reported functional ability.  Baseline: 48/100 (03/06/23); 47/100 (04/18/23); Goal status: In progress  3.  Patient will demonstrate full thoracic AROM without provocation of thoracic spine pain and tightness to improve his ability to complete tasks such as walking, sweeping, and washing dishes with less difficulty. Baseline: Limitations with extension/flexion and concordant pulling sensation with lateral flexion (03/06/23); 46 degrees of flexion, 15 degrees of extension, continued limitations in ROM with later flexion but not painful with motion (04/18/23); Goal status: In progress  4.  Patient will demonstrate full cervical AROM without provocation of UT pain and thoracic spine tightness to improve ability to complete ADLs with less difficulty.  Baseline: Limited flexion and left side bending causing UT pain (03/06/23); 44 degrees and no pain with cervical flexion, 11 degrees of left side bending with tightness but no pain (04/18/23) Goal status: In progress  5.  Patient will demonstrate improvement in Patient Specific Functional Scale (PSFS) of equal or greater than 3 points to reflect clinically significant improvement in patient's most valued functional activities. Baseline: To be tested at visit 2 as appropriate (03/06/23); 5/10 at visit #2 (03/13/2023); 7/10 at visit #10 (04/18/23); Goal status: In progress   PLAN:  PT FREQUENCY: 1-2x/week  PT DURATION: 8-12 weeks  PLANNED INTERVENTIONS: 97164- PT Re-evaluation, 97110-Therapeutic exercises, 97530- Therapeutic activity, 97112- Neuromuscular re-education, 97535- Self Care, 21308- Manual therapy, 97014-  Electrical stimulation (unattended), Patient/Family education, Dry Needling, Joint mobilization, Spinal mobilization, Cryotherapy, and Moist heat.  PLAN FOR NEXT SESSION: update HEP as appropriate, progressive postural/UE/functional strengthening, core strengthening, manual therapy as needed, education.   Luretha Murphy. Ilsa Iha, PT, DPT 05/16/23, 8:29 PM  [facility] [address] [place], [place] [address] P: [phone] I F: [phone]

## 2023-05-21 ENCOUNTER — Encounter: Payer: Self-pay | Admitting: Physical Therapy

## 2023-05-21 ENCOUNTER — Ambulatory Visit
Payer: Medicare Other | Attending: Student in an Organized Health Care Education/Training Program | Admitting: Physical Therapy

## 2023-05-21 DIAGNOSIS — M6281 Muscle weakness (generalized): Secondary | ICD-10-CM | POA: Diagnosis present

## 2023-05-21 DIAGNOSIS — M546 Pain in thoracic spine: Secondary | ICD-10-CM | POA: Insufficient documentation

## 2023-05-21 NOTE — Therapy (Signed)
 OUTPATIENT PHYSICAL THERAPY TREATMENT   Patient Name: Charles Mooney MRN: [id number] DOB:04-01-1955, 68 y.o., male Today's Date: 05/21/2023  END OF SESSION:  PT End of Session - 05/21/23 2041     Visit Number 18    Number of Visits 20    Date for PT Re-Evaluation 05/29/23    Authorization Type MEDICARE PART B reporting period from 03/06/23    Progress Note Due on Visit 20    PT Start Time 1434    PT Stop Time 1516    PT Time Calculation (min) 42 min    Activity Tolerance Patient tolerated treatment well    Behavior During Therapy [facility] for tasks assessed/performed                   Past Medical History:  Diagnosis Date   Arthritis    Hepatitis    c 2000   Hypertension    Subarachnoid hemorrhage (HCC) 2017   Wears hearing aid in both ears    Past Surgical History:  Procedure Laterality Date   BRAIN SURGERY     [facility] COILING 2017   HERNIA REPAIR  1963   double hernia repair   JOINT REPLACEMENT Bilateral    TKR  L-2012, R-2015   SHOULDER ARTHROSCOPY WITH ROTATOR CUFF REPAIR AND OPEN BICEPS TENODESIS Left 09/08/2018   Procedure: LEFT SHOULDER ARTHROSCOPY,SUBSACAPULARIS REPAIR, SUBACROMIAL DECOMP, DISTAL CLAVICLE EXCISION,BICEP TENODESIS, MINI OPEN REGENTEN PATCH APPLICATION;  Surgeon: Signa Kell, MD;  Location: [facility] ORS;  Service: Orthopedics;  Laterality: Left;   Patient Active Problem List   Diagnosis Date Noted   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   Closed fracture of rib of left side with routine healing 09/04/2022   Chronic pain of left heel 08/31/2021   Arthropathy of left wrist 10/12/2020   Viral hepatitis C 11/02/2019   Lumbar facet arthropathy 04/27/2019   Lumbar degenerative disc disease 04/27/2019   Localized osteoarthritis of shoulder regions, bilateral 04/27/2019   Encounter for long-term opiate analgesic use 04/27/2019   Chronic SI joint pain 04/27/2019   Chronic pain syndrome 04/27/2019   Pain in joint of left shoulder 06/03/2018   Abnormal gait  06/18/2017   Anatomical narrow angle 06/18/2017   Derangement of posterior horn of medial meniscus 06/18/2017   Edema 06/18/2017   Hip pain 06/18/2017   History of total knee arthroplasty 06/18/2017   Localized, primary osteoarthritis 06/18/2017   Muscle weakness 06/18/2017   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   Acute bursitis of right shoulder 06/14/2017   Nonallopathic lesion of sacral region 06/14/2017   Nonallopathic lesion of thoracic region 06/14/2017   Nonallopathic lesion of lumbosacral region 06/14/2017   Biomechanical lesion 06/14/2017   Chronic bilateral low back pain with bilateral sciatica 05/23/2017   Arthritis 04/19/2013   Cataract nuclear 06/16/2012   Glaucoma suspect of both eyes 06/16/2012    PCP: Enid Baas, MD  REFERRING PROVIDER: Edward Jolly, MD  REFERRING DIAG: myofascial pain syndrome of thoracic spine  Rationale for Evaluation and Treatment: Rehabilitation  THERAPY DIAG:  Pain in thoracic spine  Muscle weakness (generalized)  ONSET DATE: October 2024 (approximately 3 months after rib fracture in July 2024)  SUBJECTIVE:  PERTINENT HISTORY:  Patient is a 68 y.o. male who presents to outpatient physical therapy with a referral for medical diagnosis of myofascial pain syndrome of thoracic spine. This patient's chief complaints consist of chronic right sided thoracic spine pain, right shoulder pain, and occasional paresthesia to the right UE, leading to the following functional deficits: limits him with everyday activities/tasks to some amount, working on projects, working on the computer, walking for exercise, sleeping, doing as much as he wants to. He never did get into a routine with the workout program.   Relevant past medical history and comorbidities  include bilateral shoulder surgery (RTC repair, R most recent 12/2020) and subarachnoid hemorrhage in 2017. Patient denies hx of cancer, seizures, unexplained weight loss, unexplained changes in bowel or bladder problems, unexplained stumbling or dropping things, back surgery.    SUBJECTIVE STATEMENT: Patient states he is "eh" today. He states he has done a lot today: his HEP and a lot of work. The neck is bothering him a lot. It started bothering him more over the weekend and got worse yesterday. It has stayed pretty constant today. He is able to do things as long as he tries not to move his head. He states he felt a lot better after last PT session for up to a day. The neck pain seems to be changing. It stared out lower and higher and now it is more middle area. It hurts more when he turns to the left. His periscapular pain is feeling better.   PAIN: Are you having pain? Yes;  NPRS: 5-6/10 neck and  Rt medial scapular zone     FUNCTIONAL LIMITATIONS: limits him with everyday activities and tasks, working on projects, working on the computer, walking for exercise, sleeping, doing as much as he wants to  [employer]: working in the shop, building and fixing things  PRECAUTIONS: None   WEIGHT BEARING RESTRICTIONS: No   PLOF: Independent  PATIENT GOALS: to get rid of the pain, to get on an exercise and stretching regimen for his overall health, sleep with less difficulty from pain   OBJECTIVE  1RM TESTING Bent Over Row with contralateral UE braced (last tested 03/27/2023): 1 RM for Right Arm: 75.8  1 RM for Left Arm: 80.2   Bent Over Shoulder Scaption ("Y"):  (last tested 03/27/2023): 1 RM for Right Arm: 8.9  1 RM for Left Arm: 9.1   TREATMENT                                                                                                                           Therapeutic exercise: therapeutic exercises that incorporate ONE parameter at one or more areas of the body to centralize  symptoms, develop strength and endurance, range of motion, and flexibility required for successful completion of functional activities.  Standing cervical thoracic extension/BUE flexion and serratus anterior activation, lat stretch, with foam roller up wall.  1x20 with 1-5 second holds  Scapular protractoin with elbows on  wall 1x20  Wall walks with isometric B ER against TB around wrists 1x many to learn technique 1x7 (fatigue) 1 min rest 1x10 (fatigue).    Manual therapy: to reduce pain and tissue tension, improve range of motion, neuromodulation, in order to promote improved ability to complete functional activities. PRONE STM to bilateral posterior cervical spine musculature   Trigger Point Dry Needling  Subsequent Treatment: Instructions provided previously at initial dry needling treatment.  Instructions reviewed, if requested by the patient, prior to subsequent dry needling treatment.  Patient Verbal Consent Given: Yes Education Handout Provided: Previously Provided Muscles Treated: 2 dry needle(s) .25mm x 40mm inserted with 2 sticks into R UT and 3 sticks into L UT muscles, 2 dry needle(s) .25mm x 40mm inserted into right and left cervical multifidi at approx C6 and C4 levels, attempted L cervical multifidus with .30 x .30 needle but unable to reach deep enough. Patient in prone position to decrease pain and spasms along patient's neck region. Electrical Stimulation Performed: No Treatment Response/Outcome: strong twitch response at right UT, mild at left UT. Concordant symptoms at right multifidi at both levels and left multifidi at C4 level. No adverse events noted.    PATIENT EDUCATION:  Education details: Exercise purpose and form. Self management. Education on HEP including handout.  Person educated: Patient Education method: Explanation, Demonstration Education comprehension: verbalized understanding and needs further education  HOME EXERCISE PROGRAM: Access Code:  [email or web address] URL: [email or web address] Date: 05/17/2023 Prepared by: Norton Blizzard  Exercises - Dumbbell Squat at Shoulders  - 1 x daily - 2-3 x weekly - 2 sets - 20 reps - Seated Thoracic Lumbar Extension  - 1 x daily - 1 sets - 20 reps - 5 seconds hold - Standing Row with Anchored Resistance  - 3 x weekly - 2 sets - 20 reps - Resistance Pulldown with March  - 3 x weekly - 2 sets - 20 reps - Curator  - 3 x weekly - 2 sets - 1 min each arm carry time - Sitting Chest Press With Kettlebell  - 3 x weekly - 2 sets - 15 reps - Quadruped Cat Cow  - 1 x daily - 2 sets - 10 reps - Child's Pose with Sidebending  - 1 x daily - 2 sets - 30 hold - Quadruped Rock Back into [employer] Up  - 1 x daily - 1 sets - 10 reps - Prone Press Up  - 1 x daily - 2 sets - 10 reps - Standing Bent Over Single Arm Scapular Row with Table Support with PLB  - 3 x weekly - 2 sets - 15-20 reps - Single Arm Bent Over Shoulder Scaption with Dumbbell  - 3 x weekly - 2 sets - 15-20 reps - Sub-Occipital Cervical Stretch  - 1 x daily - 1 sets - 10 reps - 5 second hold - Thoracic Stretch on Foam Roll - Hands Clasped  - 1-2 sets - 10 reps - Snow Angels on Foam Roll  - 1 x daily - 2 sets - 10 reps - Prone Angels  - Seated Upper Trapezius Stretch  - 1-2 x daily - 2-3 reps - 60 seconds hold  HOME EXERCISE PROGRAM [[email or web address]] View at "[email or web address]" using code: [email or web address] Prayer Stretch -  Repeat 10 Repetitions, Hold 2 Seconds, Complete 2 Sets, Perform 3 Times a Day  HOME EXERCISE PROGRAM [[id number]] View at [email or web address] using code: [id number] Suboccipital Stretch and C1-C2 Mobilization -  Repeat 20 Repetitions, Hold 2  Seconds, Perform 3 Times a Day  HOME EXERCISE PROGRAM [[email or web address]] View at [email or web address] using code [email or web address] LAT PULLDOWN - SINGLE ARM  -  Repeat 15 Repetitions, Complete 3 Sets, Perform 1 Times a Day  6 Day Strength-Mobility Exercise Program  Determining how much  weight to use with exercise:  If the goal of the exercise is strength and hypertrophy (muscle growth), choose a heavier weight that is very challenging by the end of 8-12 reps.   If the goal of the exercise is muscle endurance, choose a lighter weight that feels very challenging after 10-15 reps (up to 20 reps).  If you can do over 20-30 reps with a weight, you likely aren't challenging the muscles enough to make strength/endurance gains.   Repetition recommendations apply to reps that you can do with proper form. For example, if your form fails after 2-3 reps, only do 2-3 reps until you can do more with good form or reduce the weight you are using if possible.    3 Non-Consecutive Days Per Week  Circuits Exercise Sets Reps Rest Interval   Warm Up (can go through twice if needed)   Aerobic Warm Up: Walking (optional)   Modified Prayer Stretch 20 5 second hold    Squats (no weight) 1 10    Supine Snow angels on Foam Roll (head supported)  1 20    Main Workout   A1 Squats with dumbbells on shoulders (8# DBs) 2-3 15   A2 Bent over single arm row  (>=30#) 2-3 8-10   1-2-minute rest following completion of circuit A if desired  B1 Deadlifts with barbell (40# added to bar) 2-3 15   B2 Push ups (modified to surface) 2-3 15   Any stretches you prefer would be great following circuit B   ASSESSMENT:  CLINICAL IMPRESSION: Patient with elevated neck pain so exercises to improve lower periscapular muscule strength and coordination included in session today as well as manual therapy and dry needling to B UT and cervical multifidi. Strong twitch response at right UT, mild at left UT. Concordant symptoms at right multifidi at both levels and left multifidi at C4 level. No adverse events noted. Patient reported feeling much better by end of session. Patient would benefit from continued management of limiting condition by skilled physical therapist to address remaining impairments and functional  limitations to work towards stated goals and return to PLOF or maximal functional independence.    OBJECTIVE IMPAIRMENTS: decreased activity tolerance, decreased endurance, decreased knowledge of condition, decreased mobility, difficulty walking, decreased ROM, decreased strength, hypomobility, impaired perceived functional ability, increased muscle spasms, impaired flexibility, impaired UE functional use, postural dysfunction, and pain.   ACTIVITY LIMITATIONS: carrying, lifting, bending, sitting, standing, squatting, sleeping, transfers, bed mobility, bathing, dressing, hygiene/grooming, locomotion level, and caring for others  PARTICIPATION LIMITATIONS: meal prep, cleaning, laundry, interpersonal relationship, driving, shopping, community activity, yard work, and everyday activities and tasks, working on projects, working on [employer], walking for exercise, sleeping, doing as much as he wants to   PERSONAL FACTORS: Age, Past/current experiences, Time since onset of injury/illness/exacerbation, and 3+ comorbidities: bilateral shoulder surgery (RTC repair, R most recent 12/2020), bilateral TKA, subarachnoid hemorrhage 2017 with SAH coiling, HTN, hx of hepatitis C (cured), double hernia repair, plantar facial fibromatosis, chronic pain syndrome, former smoker, Chronic bilateral low back pain with bilateral sciatica; Acute bursitis of right shoulder; Lumbar facet arthropathy; Lumbar degenerative disc disease; Localized osteoarthritis of shoulder regions, bilateral; Chronic SI joint pain; Chronic pain syndrome;  Pain in joint of left shoulder; Abnormal gait; Anatomical narrow angle; Biomechanical lesion; Cataract nuclear; Derangement of posterior horn of medial meniscus; Edema; Glaucoma suspect of both eyes; Hip pain; History of total knee arthroplasty; Localized, primary osteoarthritis; Hx of subarachnoid hemophage, Muscle weakness; Arthritis; Plantar fascial fibromatosis; Viral hepatitis C; Arthropathy of  left wrist; Chronic pain of left heel; Intercostal neuralgia; and Closed fracture of rib of left side with routine healing,  has a past medical history of Arthritis, Hepatitis, Hypertension, Subarachnoid hemorrhage (HCC) (2017), and Wears hearing aid in both ears.  has a past surgical history that includes Brain surgery; Joint replacement (Bilateral); Shoulder arthroscopy with rotator cuff repair and open biceps tenodesis (Left, 09/08/2018); and Hernia repair (1963). Also had surgery on right shoulder are also affecting patient's functional outcome.   REHAB POTENTIAL: Good  CLINICAL DECISION MAKING: Evolving/moderate complexity  EVALUATION COMPLEXITY: Moderate   GOALS: Goals reviewed with patient? No  SHORT TERM GOALS: Target date: 03/20/2023  Patient will be independent with initial home exercise program for self-management of symptoms. Baseline: Initial HEP to be provided at visit 2 as appropriate (03/06/23); continued participation in HEP (04/18/23); Goal status: MET   LONG TERM GOALS: Target date: 05/29/2023  Patient will be independent with a long-term home exercise program for self-management of symptoms.  Baseline: Initial HEP to be provided at visit 2 as appropriate (03/06/23); continued participation in HEP (04/18/23); Goal status: In progress  2.  Patient will demonstrate improved FOTO to equal or greater than 56 by visit #13 to demonstrate improvement in overall condition and self-reported functional ability.  Baseline: 48/100 (03/06/23); 47/100 (04/18/23); Goal status: In progress  3.  Patient will demonstrate full thoracic AROM without provocation of thoracic spine pain and tightness to improve his ability to complete tasks such as walking, sweeping, and washing dishes with less difficulty. Baseline: Limitations with extension/flexion and concordant pulling sensation with lateral flexion (03/06/23); 46 degrees of flexion, 15 degrees of extension, continued limitations in ROM with  later flexion but not painful with motion (04/18/23); Goal status: In progress  4.  Patient will demonstrate full cervical AROM without provocation of UT pain and thoracic spine tightness to improve ability to complete ADLs with less difficulty.  Baseline: Limited flexion and left side bending causing UT pain (03/06/23); 44 degrees and no pain with cervical flexion, 11 degrees of left side bending with tightness but no pain (04/18/23) Goal status: In progress  5.  Patient will demonstrate improvement in Patient Specific Functional Scale (PSFS) of equal or greater than 3 points to reflect clinically significant improvement in patient's most valued functional activities. Baseline: To be tested at visit 2 as appropriate (03/06/23); 5/10 at visit #2 (03/13/2023); 7/10 at visit #10 (04/18/23); Goal status: In progress   PLAN:  PT FREQUENCY: 1-2x/week  PT DURATION: 8-12 weeks  PLANNED INTERVENTIONS: 97164- PT Re-evaluation, 97110-Therapeutic exercises, 97530- Therapeutic activity, 97112- Neuromuscular re-education, 97535- Self Care, 40981- Manual therapy, 97014- Electrical stimulation (unattended), Patient/Family education, Dry Needling, Joint mobilization, Spinal mobilization, Cryotherapy, and Moist heat.  PLAN FOR NEXT SESSION: update HEP as appropriate, progressive postural/UE/functional strengthening, core strengthening, manual therapy as needed, education.   Luretha Murphy. Ilsa Iha, PT, DPT 05/21/23, 8:46 PM  [facility] [address] [place], [place] [address] P: [phone] I F: [phone]

## 2023-05-23 ENCOUNTER — Ambulatory Visit
Payer: Medicare Other | Attending: Student in an Organized Health Care Education/Training Program | Admitting: Student in an Organized Health Care Education/Training Program

## 2023-05-23 ENCOUNTER — Encounter: Payer: Self-pay | Admitting: Student in an Organized Health Care Education/Training Program

## 2023-05-23 VITALS — BP 147/87 | HR 84 | Temp 97.2°F | Ht 72.0 in | Wt 220.0 lb

## 2023-05-23 DIAGNOSIS — G894 Chronic pain syndrome: Secondary | ICD-10-CM | POA: Diagnosis present

## 2023-05-23 DIAGNOSIS — M7918 Myalgia, other site: Secondary | ICD-10-CM | POA: Diagnosis present

## 2023-05-23 DIAGNOSIS — M19011 Primary osteoarthritis, right shoulder: Secondary | ICD-10-CM | POA: Diagnosis present

## 2023-05-23 DIAGNOSIS — M542 Cervicalgia: Secondary | ICD-10-CM | POA: Insufficient documentation

## 2023-05-23 MED ORDER — METHOCARBAMOL 500 MG PO TABS: 500.0000 mg | ORAL_TABLET | Freq: Two times a day (BID) | ORAL | 2 refills | Status: DC | PRN

## 2023-05-23 MED ORDER — OXYCODONE-ACETAMINOPHEN 5-325 MG PO TABS: 1.0000 | ORAL_TABLET | Freq: Four times a day (QID) | ORAL | 0 refills | Status: AC | PRN

## 2023-05-23 MED ORDER — OXYCODONE-ACETAMINOPHEN 5-325 MG PO TABS
1.0000 | ORAL_TABLET | Freq: Four times a day (QID) | ORAL | 0 refills | Status: DC | PRN
Start: 2023-07-26 — End: 2023-08-14

## 2023-05-23 NOTE — Progress Notes (Signed)
 PROVIDER NOTE: Information contained herein reflects review and annotations entered in association with encounter. Interpretation of such information and data should be left to medically-trained personnel. Information provided to patient can be located elsewhere in the medical record under "Patient Instructions". Document created using STT-dictation technology, any transcriptional errors that may result from process are unintentional.    Patient: Charles Mooney  Service Category: E/M  Provider: Edward Jolly, MD  DOB: 03-Sep-1955  DOS: 05/23/2023  Referring Provider: Enid Baas, MD  MRN: [id number]  Specialty: Interventional Pain Management  PCP: Enid Baas, MD  Type: Established Patient  Setting: Ambulatory outpatient    Location: Office  Delivery: Face-to-face     HPI  Charles Mooney, a 68 y.o. year old male, is here today because of his Chronic pain syndrome [G89.4]. Charles Mooney's primary complain today is Neck Pain  Pertinent problems: Charles Mooney has Chronic bilateral low back pain with bilateral sciatica; Acute bursitis of right shoulder; Nonallopathic lesion of sacral region; Lumbar facet arthropathy; Lumbar degenerative disc disease; Localized osteoarthritis of shoulder regions, bilateral; Encounter for long-term opiate analgesic use; Chronic SI joint pain; and Chronic pain syndrome on their pertinent problem list. Pain Assessment: Severity of Chronic pain is reported as a 6 /10. Location: Neck Right, Left/pain radiaties down both shoulder. Onset: More than a month ago. Quality: Aching, Burning, Constant. Timing: Constant. Modifying factor(s): Meds and heat. Vitals:  height is 6' (1.829 m) and weight is 220 lb (99.8 kg). His temperature is 97.2 F (36.2 C) (abnormal). His blood pressure is 147/87 (abnormal) and his Mooney is 84. His oxygen saturation is 98%.  BMI: Estimated body mass index is 29.84 kg/m as calculated from the following:   Height as of this encounter: 6' (1.829  m).   Weight as of this encounter: 220 lb (99.8 kg). Last encounter: 03/19/2023. Last procedure: 03/04/2023.  Reason for encounter:   Discussed the use of AI scribe software for clinical note transcription with the patient, who gave verbal consent to proceed.  History of Present Illness   Charles Mooney "Charles Mooney" is a 68 year old male who presents with increased neck pain and right shoulder issues.  He has experienced a significant worsening of neck pain. Despite ongoing physical therapy, which he finds helpful, the pain persists. Previous treatments, including Robaxin and a Toradol injection, did not provide relief. He has been using baclofen and Flexeril sparingly but is uncertain of their effectiveness. He recalls a previous issue with tizanidine causing a drop in blood pressure, which led to discontinuation. He is currently not taking Mobic.  He continues to have issues with his right shoulder, although there is some improvement in range of motion and stability due to physical therapy.       Pharmacotherapy Assessment  Analgesic:  Percocet 5 mg QID prn   Monitoring: [place] PMP: PDMP reviewed during this encounter.       Pharmacotherapy: No side-effects or adverse reactions reported. Compliance: No problems identified. Effectiveness: Clinically acceptable.  Charles Pulse, RN  05/23/2023  8:39 AM  Sign when Signing Visit Nursing Pain Medication Assessment:  Safety precautions to be maintained throughout the outpatient stay will include: orient to surroundings, keep bed in low position, maintain call bell within reach at all times, provide assistance with transfer out of bed and ambulation.  Medication Inspection Compliance: Pill count conducted under aseptic conditions, in front of the patient. Neither the pills nor the bottle was removed from the patient's sight at any time.  Once count was completed pills were immediately returned to the patient in their original bottle.  Medication:  Oxycodone/APAP Pill/Patch Count:  18 of 120 pills remain Pill/Patch Appearance: Markings consistent with prescribed medication Bottle Appearance: Standard pharmacy container. Clearly labeled. Filled Date: 3 / 8 / 2025 Last Medication intake:  TodaySafety precautions to be maintained throughout the outpatient stay will include: orient to surroundings, keep bed in low position, maintain call bell within reach at all times, provide assistance with transfer out of bed and ambulation.     No results found for: "CBDTHCR" No results found for: "D8THCCBX" No results found for: "D9THCCBX"  UDS:  Summary  Date Value Ref Range Status  06/05/2022 Note  Final    Comment:    ==================================================================== [employer] (MW) ==================================================================== Test                             Result       Flag       Units  Drug Present and Declared for Prescription Verification   Oxycodone                      717          EXPECTED   ng/mg creat   Oxymorphone                    700          EXPECTED   ng/mg creat   Noroxycodone                   2135         EXPECTED   ng/mg creat   Noroxymorphone                 348          EXPECTED   ng/mg creat    Sources of oxycodone are scheduled prescription medications.    Oxymorphone, noroxycodone, and noroxymorphone are expected    metabolites of oxycodone. Oxymorphone is also available as a    scheduled prescription medication.  ==================================================================== Test                      Result    Flag   Units      Ref Range   Creatinine              23               mg/dL      >=62 ==================================================================== Declared Medications:  The flagging and interpretation on this report are based on the  following declared medications.  Unexpected results may arise from  inaccuracies in the declared  medications.   **Note: The testing scope of this panel includes these medications:   Oxycodone (Percocet)   **Note: The testing scope of this panel does not include the  following reported medications:   Acetaminophen (Percocet)  Amlodipine (Norvasc)  Baclofen (Lioresal)  Cannabidiol  Citalopram (Celexa)  Cyclobenzaprine (Flexeril)  Meloxicam (Mobic)  Trazodone (Desyrel) ==================================================================== For clinical consultation, please call [phone]. ====================================================================       ROS  Constitutional: Denies any fever or chills Gastrointestinal: No reported hemesis, hematochezia, vomiting, or acute GI distress Musculoskeletal:  as above Neurological: No reported episodes of acute onset apraxia, aphasia, dysarthria, agnosia, amnesia, paralysis, loss of coordination, or loss of consciousness  Medication Review  NON FORMULARY, amLODipine, citalopram,  methocarbamol, oxyCODONE-acetaminophen, and traZODone  History Review  Allergy: Charles Mooney is allergic to sulfa antibiotics. Drug: Charles Mooney  reports that he does not currently use drugs. Alcohol:  reports current alcohol use of about 18.0 standard drinks of alcohol per week. Tobacco:  reports that he has been smoking cigarettes. He has never used smokeless tobacco. Social: Charles Mooney  reports that he has been smoking cigarettes. He has never used smokeless tobacco. He reports current alcohol use of about 18.0 standard drinks of alcohol per week. He reports that he does not currently use drugs. Medical:  has a past medical history of Arthritis, Hepatitis, Hypertension, Subarachnoid hemorrhage (HCC) (2017), and Wears hearing aid in both ears. Surgical: Charles Mooney  has a past surgical history that includes Brain surgery; Joint replacement (Bilateral); Shoulder arthroscopy with rotator cuff repair and open biceps tenodesis (Left, 09/08/2018); and  Hernia repair (1963). Family: family history is not on file.  Laboratory Chemistry Profile   Renal Lab Results  Component Value Date   BUN 18 09/04/2018   CREATININE 0.81 09/04/2018   GFRAA >60 09/04/2018   GFRNONAA >60 09/04/2018    Hepatic Lab Results  Component Value Date   AST 18 09/04/2018   ALT 20 09/04/2018   ALBUMIN 4.4 09/04/2018   ALKPHOS 39 09/04/2018    Electrolytes Lab Results  Component Value Date   NA 138 09/04/2018   K 3.9 09/04/2018   CL 105 09/04/2018   CALCIUM 9.2 09/04/2018    Bone No results found for: "VD25OH", "VD125OH2TOT", "ZO1096EA5", "WU9811BJ4", "25OHVITD1", "25OHVITD2", "25OHVITD3", "TESTOFREE", "TESTOSTERONE"  Inflammation (CRP: Acute Phase) (ESR: Chronic Phase) No results found for: "CRP", "ESRSEDRATE", "LATICACIDVEN"       Note: Above Lab results reviewed.    Assessment   Diagnosis  1. Chronic pain syndrome   2. Myofascial pain syndrome of thoracic spine   3. Arthritis of right shoulder region   4. Cervicalgia      Updated Problems: No problems updated.  Plan of Care   Assessment and Plan    Neck Pain   Chronic neck pain has been increasing in severity. Previous treatments with methocarbamol and ketorolac injection were ineffective. Physical therapy is ongoing and beneficial. Methocarbamol will be reintroduced with caution due to potential sedation. Continue physical therapy sessions. Prescribe methocarbamol 500 mg or 1000 mg every 12 hours as needed for muscle relaxation. Monitor for sedation as a side effect of methocarbamol.  Right Shoulder Pain   Chronic right shoulder pain has shown improved range of motion and stability due to physical therapy. Continue physical therapy sessions.  Medication Management   He has been using baclofen and cyclobenzaprine sparingly with uncertain efficacy. Tizanidine was discontinued due to hypotension. Meloxicam is no longer used. Methocarbamol will be prescribed as the sole muscle  relaxant. Discontinue baclofen and cyclobenzaprine. Remove meloxicam from the medication list. Prescribe methocarbamol as the sole muscle relaxant.       Refill Oxycodone as below Pharmacotherapy (Medications Ordered): Meds ordered this encounter  Medications   oxyCODONE-acetaminophen (PERCOCET/ROXICET) 5-325 MG tablet    Sig: Take 1 tablet by mouth every 6 (six) hours as needed for severe pain (pain score 7-10).    Dispense:  120 tablet    Refill:  0   oxyCODONE-acetaminophen (PERCOCET/ROXICET) 5-325 MG tablet    Sig: Take 1 tablet by mouth every 6 (six) hours as needed for severe pain (pain score 7-10).    Dispense:  120 tablet    Refill:  0   oxyCODONE-acetaminophen (  PERCOCET/ROXICET) 5-325 MG tablet    Sig: Take 1 tablet by mouth every 6 (six) hours as needed for severe pain (pain score 7-10).    Dispense:  120 tablet    Refill:  0   methocarbamol (ROBAXIN) 500 MG tablet    Sig: Take 1-2 tablets (500-1,000 mg total) by mouth every 12 (twelve) hours as needed for muscle spasms.    Dispense:  60 tablet    Refill:  2    Do not place this medication, or any other prescription from our practice, on "Automatic Refill". Patient may have prescription filled one day early if pharmacy is closed on scheduled refill date.   Orders:  No orders of the defined types were placed in this encounter.  Follow-up plan:   Return in about 3 months (around 08/22/2023) for MM, F2F, Randalyn Rhea, MM.      Status post bilateral L3, L4, L5, S1 facet  medial branch nerve blocks #1 on 06/01/2019 50% pain relief for 3 days, consider repeating in future or consider sprint peripheral nerve stimulation of medial branch at L4 , s/p right glenohumeral joint injection 09/14/2020.         Recent Visits Date Type Provider Dept  03/19/23 Office Visit Edward Jolly, MD [facility]  03/04/23 Procedure visit Edward Jolly, MD [facility]  02/26/23 Office Visit Edward Jolly, MD [facility]   Showing recent visits within past 90 days and meeting all other requirements Today's Visits Date Type Provider Dept  05/23/23 Office Visit Edward Jolly, MD [facility]  Showing today's visits and meeting all other requirements Future Appointments Date Type Provider Dept  08/20/23 Appointment Bettey Costa, NP [facility]  Showing future appointments within next 90 days and meeting all other requirements  I discussed the assessment and treatment plan with the patient. The patient was provided an opportunity to ask questions and all were answered. The patient agreed with the plan and demonstrated an understanding of the instructions.  Patient advised to call back or seek an in-person evaluation if the symptoms or condition worsens.  Duration of encounter: 30 minutes.  Total time on encounter, as per AMA guidelines included both the face-to-face and non-face-to-face time personally spent by the physician and/or other qualified health care professional(s) on the day of the encounter (includes time in activities that require the physician or other qualified health care professional and does not include time in activities normally performed by clinical staff). Physician's time may include the following activities when performed: Preparing to see the patient (e.g., pre-charting review of records, searching for previously ordered imaging, lab work, and nerve conduction tests) Review of prior analgesic pharmacotherapies. Reviewing PMP Interpreting ordered tests (e.g., lab work, imaging, nerve conduction tests) Performing post-procedure evaluations, including interpretation of diagnostic procedures Obtaining and/or reviewing separately obtained history Performing a medically appropriate examination and/or evaluation Counseling and educating the patient/family/caregiver Ordering medications, tests, or procedures Referring and communicating with other health care professionals  (when not separately reported) Documenting clinical information in the electronic or other health record Independently interpreting results (not separately reported) and communicating results to the patient/ family/caregiver Care coordination (not separately reported)  Note by: Edward Jolly, MD Date: 05/23/2023; Time: 9:47 AM

## 2023-05-23 NOTE — Progress Notes (Signed)
 Nursing Pain Medication Assessment:  Safety precautions to be maintained throughout the outpatient stay will include: orient to surroundings, keep bed in low position, maintain call bell within reach at all times, provide assistance with transfer out of bed and ambulation.  Medication Inspection Compliance: Pill count conducted under aseptic conditions, in front of the patient. Neither the pills nor the bottle was removed from the patient's sight at any time. Once count was completed pills were immediately returned to the patient in their original bottle.  Medication: Oxycodone/APAP Pill/Patch Count:  18 of 120 pills remain Pill/Patch Appearance: Markings consistent with prescribed medication Bottle Appearance: Standard pharmacy container. Clearly labeled. Filled Date: 3 / 8 / 2025 Last Medication intake:  TodaySafety precautions to be maintained throughout the outpatient stay will include: orient to surroundings, keep bed in low position, maintain call bell within reach at all times, provide assistance with transfer out of bed and ambulation.

## 2023-06-04 NOTE — Therapy (Unsigned)
 OUTPATIENT PHYSICAL THERAPY TREATMENT / PROGRESS NOTE / RE-CERTIFICATION Dates of reporting from 04/18/2023 to 06/05/2023   Patient Name: Charles Mooney MRN: [id number] DOB:1955/06/20, 68 y.o., male Today's Date: 06/05/2023  END OF SESSION:  PT End of Session - 06/05/23 1219     Visit Number 19    Number of Visits 20    Date for PT Re-Evaluation 08/28/23    Authorization Type MEDICARE PART B reporting period from 03/06/23    Progress Note Due on Visit 20    PT Start Time 0905    PT Stop Time 0948    PT Time Calculation (min) 43 min    Activity Tolerance Patient tolerated treatment well    Behavior During Therapy [facility] for tasks assessed/performed              Past Medical History:  Diagnosis Date   Arthritis    Hepatitis    c 2000   Hypertension    Subarachnoid hemorrhage (HCC) 2017   Wears hearing aid in both ears    Past Surgical History:  Procedure Laterality Date   BRAIN SURGERY     [facility] COILING 2017   HERNIA REPAIR  1963   double hernia repair   JOINT REPLACEMENT Bilateral    TKR  L-2012, R-2015   SHOULDER ARTHROSCOPY WITH ROTATOR CUFF REPAIR AND OPEN BICEPS TENODESIS Left 09/08/2018   Procedure: LEFT SHOULDER ARTHROSCOPY,SUBSACAPULARIS REPAIR, SUBACROMIAL DECOMP, DISTAL CLAVICLE EXCISION,BICEP TENODESIS, MINI OPEN REGENTEN PATCH APPLICATION;  Surgeon: Lorri Rota, MD;  Location: [facility] ORS;  Service: Orthopedics;  Laterality: Left;   Patient Active Problem List   Diagnosis Date Noted   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   Closed fracture of rib of left side with routine healing 09/04/2022   Chronic pain of left heel 08/31/2021   Arthropathy of left wrist 10/12/2020   Viral hepatitis C 11/02/2019   Lumbar facet arthropathy 04/27/2019   Lumbar degenerative disc disease 04/27/2019   Localized osteoarthritis of shoulder regions, bilateral 04/27/2019   Encounter for long-term opiate analgesic use 04/27/2019   Chronic SI joint pain 04/27/2019   Chronic pain  syndrome 04/27/2019   Pain in joint of left shoulder 06/03/2018   Abnormal gait 06/18/2017   Anatomical narrow angle 06/18/2017   Derangement of posterior horn of medial meniscus 06/18/2017   Edema 06/18/2017   Hip pain 06/18/2017   History of total knee arthroplasty 06/18/2017   Localized, primary osteoarthritis 06/18/2017   Muscle weakness 06/18/2017   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   Acute bursitis of right shoulder 06/14/2017   Nonallopathic lesion of sacral region 06/14/2017   Nonallopathic lesion of thoracic region 06/14/2017   Nonallopathic lesion of lumbosacral region 06/14/2017   Biomechanical lesion 06/14/2017   Chronic bilateral low back pain with bilateral sciatica 05/23/2017   Arthritis 04/19/2013   Cataract nuclear 06/16/2012   Glaucoma suspect of both eyes 06/16/2012    PCP: Rex Castor, MD  REFERRING PROVIDER: Cephus Collin, MD  REFERRING DIAG: myofascial pain syndrome of thoracic spine  Rationale for Evaluation and Treatment: Rehabilitation  THERAPY DIAG:  Pain in thoracic spine  Muscle weakness (generalized)  ONSET DATE: October 2024 (approximately 3 months after rib fracture in July 2024)  SUBJECTIVE:  PERTINENT HISTORY:  Patient is a 68 y.o. male who presents to outpatient physical therapy with a referral for medical diagnosis of myofascial pain syndrome of thoracic spine. This patient's chief complaints consist of chronic right sided thoracic spine pain, right shoulder pain, and occasional paresthesia to the right UE, leading to the following functional deficits: limits him with everyday activities/tasks to some amount, working on projects, working on the computer, walking for exercise, sleeping, doing as much as he wants to. He never did get into a routine  with the workout program.   Relevant past medical history and comorbidities include bilateral shoulder surgery (RTC repair, R most recent 12/2020) and subarachnoid hemorrhage in 2017. Patient denies hx of cancer, seizures, unexplained weight loss, unexplained changes in bowel or bladder problems, unexplained stumbling or dropping things, back surgery.    SUBJECTIVE STATEMENT: Patient states he is feeling pretty good today but last week was pretty rough. He had visitors and was really busy with things last PT session. He states his pain is moving around. It was in the right medial scapular region (which is better but is still bothering him), then he started PT and it started hurting in the neck (which is calmer right now but still bothers him), and this past week his right shoulder really bothered him which seemed directly related to increased activity this past week and rest made it better. His sister who was visiting is a [place] and worked on a muscle in his axillary region and he though that helped some too.   Per chart review 06/04/2023: Dr. Cherylann Ratel recommended continuing PT for neck and right shoulder at visit with him on 05/23/2023.   PAIN: Are you having pain? Yes;  NPRS: 4/10 neck and  4/10 Rt medial scapular zone, Rt shoulder 4/10 (front and back of shoulder, really hurt when he tried to raise his arm)     FUNCTIONAL LIMITATIONS: limits him with everyday activities and tasks, working on projects, working on the computer, walking for exercise, sleeping, doing as much as he wants to  [employer]: working in the shop, building and fixing things  PRECAUTIONS: None   WEIGHT BEARING RESTRICTIONS: No   PLOF: Independent  PATIENT GOALS: to get rid of the pain, to get on an exercise and stretching regimen for his overall health, sleep with less difficulty from pain   OBJECTIVE  PATIENT SPECIFIC FUNCTIONAL SCALE             1. Exercise: 6            2. Building/Using Tools: 7             3. Walking: 6            Average: 6.3/10   SPINE MOTION   CERVICAL SPINE AROM *Indicates pain Flexion: 35 pain/tightness at right UT Extension: 45  Side Flexion:       R 13 bilateral neck pain      L  13 bilateral neck pain Rotation:  R 69 deg ipsilateral neck pain, contralateral tightness at upper C-spine L 44 deg contralateral neck pain, ipsilateral tightness at upper C-spine     SEATED THORACIC SPINE AROM *Indicates pain Flexion: 45 deg tightness in neck Extension: 10 deg low back pain Side Flexion:       R 25 deg ipsilateral lower rib pain (worst with return to sitting), contralateral tightness lumbar level      L 40 deg Rotation:  R 45 deg lower back stiffness L 50  deg lower back stiffness **increased pain at right periscapular region following measurements     NEUROLOGICAL Deep Tendon Reflexes R/L  2+/2+ Biceps brachii reflex (C5, C6) 2+/2+ Brachioradialis reflex (C6) 2+/2+ Triceps brachii reflex (C7)  MUSCLE PERFORMANCE (MMT):  *Indicates pain 06/06/23 Date Date  Joint/Motion R/L R/L R/L  Shoulder     Flexion 4*/5 / /  Abduction (C5) 4-*/5 / /  External rotation 4*/5 / /  Internal rotation 5/5 / /  Extension / / /  Periscapular     Serratus A.  4-*/5 / /  Middle Trap 4+/4- / /  Lower Trap 3+/3+ / /  Elbow     Flexion (C6) 5*/5 / /  Extension (C7) 4+*/5 / /  Wrist     Flexion (C7) 5/5 / /  Extension (C6) 5/5 / /  Ulnar deviation (C8) 5/5 / /  Hand     Thumb extension (C8) B WNL / /  Finger adduction (T1) B WNL / /  Comments:  06/05/23: compensatory shoulder hike in R abduction  SPECIAL TESTS:  SHOULDER SPECIAL TESTS Drop arm test: R = negative Hawkins-Kennedy test: R = negative Infraspinatus test: R = positive   1RM TESTING Bent Over Row with contralateral UE braced (last tested 03/27/2023): 1 RM for Right Arm: 75.8  1 RM for Left Arm: 80.2   Bent Over Shoulder Scaption ("Y"):  (last tested 03/27/2023): 1 RM for Right Arm: 8.9  1 RM for  Left Arm: 9.1   TREATMENT                                                                                                                           Therapeutic exercise: therapeutic exercises that incorporate ONE parameter at one or more areas of the body to centralize symptoms, develop strength and endurance, range of motion, and flexibility required for successful completion of functional activities.  NuStep using bilateral upper and lower extremities. For improved extremity mobility, muscular endurance, and activity tolerance; and to induce the analgesic effect of aerobic exercise, stimulate improved joint nutrition, and prepare body structures and systems for following interventions. Also to reinforce understanding of appropriate exercise intensity to help meet physical activity guidelines for health.  Seat/handle setting: 12/12 5  minutes Level: 7 Target SPM: > 100 Average SPM: 105 RPE: 6-7/10   Exam to assess progress and re-examine condition to help guide interventions (see above)   PATIENT EDUCATION:  Education details: Education on diagnosis, prognosis, POC, anatomy and physiology of current condition.  Person educated: Patient Education method: Explanation, Demonstration Education comprehension: verbalized understanding and needs further education  HOME EXERCISE PROGRAM: Access Code: [email or web address] URL: [email or web address] Date: 05/17/2023 Prepared by: Alleen Isle  Exercises - Dumbbell Squat at Shoulders  - 1 x daily - 2-3 x weekly - 2 sets - 20 reps - Seated Thoracic Lumbar Extension  - 1 x daily - 1 sets - 20 reps - 5 seconds hold - Standing Row with Anchored  Resistance  - 3 x weekly - 2 sets - 20 reps - Resistance Pulldown with March  - 3 x weekly - 2 sets - 20 reps - Curator  - 3 x weekly - 2 sets - 1 min each arm carry time - Sitting Chest Press With Kettlebell  - 3 x weekly - 2 sets - 15 reps - Quadruped Cat Cow  - 1 x daily - 2 sets -  10 reps - Child's Pose with Sidebending  - 1 x daily - 2 sets - 30 hold - Quadruped Rock Back into [employer] Up  - 1 x daily - 1 sets - 10 reps - Prone Press Up  - 1 x daily - 2 sets - 10 reps - Standing Bent Over Single Arm Scapular Row with Table Support with PLB  - 3 x weekly - 2 sets - 15-20 reps - Single Arm Bent Over Shoulder Scaption with Dumbbell  - 3 x weekly - 2 sets - 15-20 reps - Sub-Occipital Cervical Stretch  - 1 x daily - 1 sets - 10 reps - 5 second hold - Thoracic Stretch on Foam Roll - Hands Clasped  - 1-2 sets - 10 reps - Snow Angels on Foam Roll  - 1 x daily - 2 sets - 10 reps - Prone Angels  - Seated Upper Trapezius Stretch  - 1-2 x daily - 2-3 reps - 60 seconds hold  HOME EXERCISE PROGRAM [[email or web address]] View at "[email or web address]" using code: [email or web address] Prayer Stretch -  Repeat 10 Repetitions, Hold 2 Seconds, Complete 2 Sets, Perform 3 Times a Day  HOME EXERCISE PROGRAM [[id number]] View at [email or web address] using code: [id number] Suboccipital Stretch and C1-C2 Mobilization -  Repeat 20 Repetitions, Hold 2 Seconds, Perform 3 Times a Day  HOME EXERCISE PROGRAM [[email or web address]] View at [email or web address] using code [email or web address] LAT PULLDOWN - SINGLE ARM  -  Repeat 15 Repetitions, Complete 3 Sets, Perform 1 Times a Day  6 Day Strength-Mobility Exercise Program  Determining how much weight to use with exercise:  If the goal of the exercise is strength and hypertrophy (muscle growth), choose a heavier weight that is very challenging by the end of 8-12 reps.   If the goal of the exercise is muscle endurance, choose a lighter weight that feels very challenging after 10-15 reps (up to 20 reps).  If you can do over 20-30 reps with a weight, you likely aren't challenging the muscles enough to make strength/endurance gains.   Repetition recommendations apply to reps that you can do with proper form. For example, if your form fails after 2-3 reps, only do 2-3 reps until you can do  more with good form or reduce the weight you are using if possible.    3 Non-Consecutive Days Per Week  Circuits Exercise Sets Reps Rest Interval   Warm Up (can go through twice if needed)   Aerobic Warm Up: Walking (optional)   Modified Prayer Stretch 20 5 second hold    Squats (no weight) 1 10    Supine Snow angels on Foam Roll (head supported)  1 20    Main Workout   A1 Squats with dumbbells on shoulders (8# DBs) 2-3 15   A2 Bent over single arm row  (>=30#) 2-3 8-10   1-2-minute rest following completion of circuit A if desired  B1 Deadlifts with barbell (40# added to bar) 2-3 15   B2 Push ups (modified to surface) 2-3 15   Any stretches you prefer  would be great following circuit B   ASSESSMENT:  CLINICAL IMPRESSION: Patient has attended 19 physical therapy sessions since starting current episode of care on 03/06/2023. He initially made some progress but developed neck pain and has intermittant right shoulder pain that is bothersome and limiting him since starting episode of care. He has gotten temporary and short term relief from manual therapy, dry needling, and some stretches, but no lasting changes. He has been ambitious with his exercise goals and HEP but has been having trouble participating in HEP lately and clinic visits have been sporadic due to scheduling difficulties. He has worsened again since his 10th visit progress note, but is not without potential for improvement. He has a lot of neck and thoracic spine stiffness, and does not have signs of nerve root irritation referring to his shoulder. He appears to have increased muscle tension and pain with somatic pain radiating from the cervical and thoracic spine. Plan to continue with PT for 10 more visits and send back to referring clinician if no further improvements or worsening occur. Referring MD has seen him recently and recommended continuing PT for shoulder and neck pain. Patient would benefit from continued  management of limiting condition by skilled physical therapist to address remaining impairments and functional limitations to work towards stated goals and return to PLOF or maximal functional independence.    OBJECTIVE IMPAIRMENTS: decreased activity tolerance, decreased endurance, decreased knowledge of condition, decreased mobility, difficulty walking, decreased ROM, decreased strength, hypomobility, impaired perceived functional ability, increased muscle spasms, impaired flexibility, impaired UE functional use, postural dysfunction, and pain.   ACTIVITY LIMITATIONS: carrying, lifting, bending, sitting, standing, squatting, sleeping, transfers, bed mobility, bathing, dressing, hygiene/grooming, locomotion level, and caring for others  PARTICIPATION LIMITATIONS: meal prep, cleaning, laundry, interpersonal relationship, driving, shopping, community activity, yard work, and everyday activities and tasks, working on projects, working on [employer], walking for exercise, sleeping, doing as much as he wants to   PERSONAL FACTORS: Age, Past/current experiences, Time since onset of injury/illness/exacerbation, and 3+ comorbidities: bilateral shoulder surgery (RTC repair, R most recent 12/2020), bilateral TKA, subarachnoid hemorrhage 2017 with SAH coiling, HTN, hx of hepatitis C (cured), double hernia repair, plantar facial fibromatosis, chronic pain syndrome, former smoker, Chronic bilateral low back pain with bilateral sciatica; Acute bursitis of right shoulder; Lumbar facet arthropathy; Lumbar degenerative disc disease; Localized osteoarthritis of shoulder regions, bilateral; Chronic SI joint pain; Chronic pain syndrome; Pain in joint of left shoulder; Abnormal gait; Anatomical narrow angle; Biomechanical lesion; Cataract nuclear; Derangement of posterior horn of medial meniscus; Edema; Glaucoma suspect of both eyes; Hip pain; History of total knee arthroplasty; Localized, primary osteoarthritis; Hx of  subarachnoid hemophage, Muscle weakness; Arthritis; Plantar fascial fibromatosis; Viral hepatitis C; Arthropathy of left wrist; Chronic pain of left heel; Intercostal neuralgia; and Closed fracture of rib of left side with routine healing,  has a past medical history of Arthritis, Hepatitis, Hypertension, Subarachnoid hemorrhage (HCC) (2017), and Wears hearing aid in both ears.  has a past surgical history that includes Brain surgery; Joint replacement (Bilateral); Shoulder arthroscopy with rotator cuff repair and open biceps tenodesis (Left, 09/08/2018); and Hernia repair (1963). Also had surgery on right shoulder are also affecting patient's functional outcome.   REHAB POTENTIAL: Good  CLINICAL DECISION MAKING: Evolving/moderate complexity  EVALUATION COMPLEXITY: Moderate   GOALS: Goals reviewed with patient? No  SHORT TERM GOALS: Target date: 03/20/2023  Patient will be independent with initial home exercise program for self-management of symptoms. Baseline: Initial HEP to be  provided at visit 2 as appropriate (03/06/23); continued participation in HEP (04/18/23); Goal status: MET   LONG TERM GOALS: Target date: 05/29/2023. Target date updated to 08/28/2023 for all unmet goals on 06/05/2023.  Patient will be independent with a long-term home exercise program for self-management of symptoms.  Baseline: Initial HEP to be provided at visit 2 as appropriate (03/06/23); continued participation in HEP (04/18/23); not participating recently (06/05/2023);  Goal status: In progress  2.  Patient will demonstrate improved FOTO to equal or greater than 56 by visit #13 to demonstrate improvement in overall condition and self-reported functional ability.  Baseline: 48/100 (03/06/23); 47/100 (04/18/23); Goal status: Discontinued due to clinic discontinuing access to measure (06/05/2023)  3.  Patient will demonstrate full thoracic AROM without provocation of thoracic spine pain and tightness to improve his  ability to complete tasks such as walking, sweeping, and washing dishes with less difficulty. Baseline: Limitations with extension/flexion and concordant pulling sensation with lateral flexion (03/06/23); 46 degrees of flexion, 15 degrees of extension, continued limitations in ROM with later flexion but not painful with motion (04/18/23); continues with limitations and pain - see objective (06/05/2023);  Goal status: Ongoing  4.  Patient will demonstrate full cervical AROM without provocation of UT pain and thoracic spine tightness to improve ability to complete ADLs with less difficulty.  Baseline: Limited flexion and left side bending causing UT pain (03/06/23); 44 degrees and no pain with cervical flexion, 11 degrees of left side bending with tightness but no pain (04/18/23); continues with limitations and pain in neck (06/05/2023);  Goal status: In progress  5.  Patient will demonstrate improvement in Patient Specific Functional Scale (PSFS) of equal or greater than 3 points to reflect clinically significant improvement in patient's most valued functional activities. Baseline: To be tested at visit 2 as appropriate (03/06/23); 5/10 at visit #2 (03/13/2023); 7/10 at visit #10 (04/18/23); 6.3/10 (06/05/2023);  Goal status: In progress   PLAN:  PT FREQUENCY: 1-2x/week  PT DURATION: 8-12 weeks  PLANNED INTERVENTIONS: 97164- PT Re-evaluation, 97110-Therapeutic exercises, 97530- Therapeutic activity, 97112- Neuromuscular re-education, 97535- Self Care, 19147- Manual therapy, 97014- Electrical stimulation (unattended), Patient/Family education, Dry Needling, Joint mobilization, Spinal mobilization, Cryotherapy, and Moist heat.  PLAN FOR NEXT SESSION: update HEP as appropriate, progressive postural/UE/functional strengthening, core strengthening, manual therapy as needed, education.   Carilyn Charles. Artemio Larry, PT, DPT 06/05/23, 8:03 PM  [facility] [address] [place], [place] [address] P: [phone] I F: [phone]

## 2023-06-05 ENCOUNTER — Encounter: Payer: Self-pay | Admitting: Physical Therapy

## 2023-06-05 ENCOUNTER — Ambulatory Visit: Admitting: Physical Therapy

## 2023-06-05 DIAGNOSIS — M546 Pain in thoracic spine: Secondary | ICD-10-CM | POA: Diagnosis not present

## 2023-06-05 DIAGNOSIS — M6281 Muscle weakness (generalized): Secondary | ICD-10-CM

## 2023-06-12 ENCOUNTER — Ambulatory Visit: Admitting: Physical Therapy

## 2023-06-12 ENCOUNTER — Encounter: Payer: Self-pay | Admitting: Physical Therapy

## 2023-06-12 DIAGNOSIS — M546 Pain in thoracic spine: Secondary | ICD-10-CM

## 2023-06-12 DIAGNOSIS — M6281 Muscle weakness (generalized): Secondary | ICD-10-CM

## 2023-06-12 NOTE — Therapy (Signed)
 OUTPATIENT PHYSICAL THERAPY TREATMENT / PROGRESS NOTE Dates of reporting from 04/18/2023 to 06/12/2023   Patient Name: Charles Mooney MRN: [id number] DOB:November 07, 1955, 68 y.o., male Today's Date: 06/12/2023  END OF SESSION:  PT End of Session - 06/12/23 1723     Visit Number 20    Number of Visits 20    Date for PT Re-Evaluation 08/28/23    Authorization Type MEDICARE PART B reporting period from 03/06/23    Progress Note Due on Visit 20    PT Start Time 1649    PT Stop Time 1739    PT Time Calculation (min) 50 min    Activity Tolerance Patient tolerated treatment well    Behavior During Therapy [facility] for tasks assessed/performed               Past Medical History:  Diagnosis Date   Arthritis    Hepatitis    c 2000   Hypertension    Subarachnoid hemorrhage (HCC) 2017   Wears hearing aid in both ears    Past Surgical History:  Procedure Laterality Date   BRAIN SURGERY     [facility] COILING 2017   HERNIA REPAIR  1963   double hernia repair   JOINT REPLACEMENT Bilateral    TKR  L-2012, R-2015   SHOULDER ARTHROSCOPY WITH ROTATOR CUFF REPAIR AND OPEN BICEPS TENODESIS Left 09/08/2018   Procedure: LEFT SHOULDER ARTHROSCOPY,SUBSACAPULARIS REPAIR, SUBACROMIAL DECOMP, DISTAL CLAVICLE EXCISION,BICEP TENODESIS, MINI OPEN REGENTEN PATCH APPLICATION;  Surgeon: Lorri Rota, MD;  Location: [facility] ORS;  Service: Orthopedics;  Laterality: Left;   Patient Active Problem List   Diagnosis Date Noted   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   Closed fracture of rib of left side with routine healing 09/04/2022   Chronic pain of left heel 08/31/2021   Arthropathy of left wrist 10/12/2020   Viral hepatitis C 11/02/2019   Lumbar facet arthropathy 04/27/2019   Lumbar degenerative disc disease 04/27/2019   Localized osteoarthritis of shoulder regions, bilateral 04/27/2019   Encounter for long-term opiate analgesic use 04/27/2019   Chronic SI joint pain 04/27/2019   Chronic pain syndrome 04/27/2019    Pain in joint of left shoulder 06/03/2018   Abnormal gait 06/18/2017   Anatomical narrow angle 06/18/2017   Derangement of posterior horn of medial meniscus 06/18/2017   Edema 06/18/2017   Hip pain 06/18/2017   History of total knee arthroplasty 06/18/2017   Localized, primary osteoarthritis 06/18/2017   Muscle weakness 06/18/2017   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   Acute bursitis of right shoulder 06/14/2017   Nonallopathic lesion of sacral region 06/14/2017   Nonallopathic lesion of thoracic region 06/14/2017   Nonallopathic lesion of lumbosacral region 06/14/2017   Biomechanical lesion 06/14/2017   Chronic bilateral low back pain with bilateral sciatica 05/23/2017   Arthritis 04/19/2013   Cataract nuclear 06/16/2012   Glaucoma suspect of both eyes 06/16/2012    PCP: Rex Castor, MD  REFERRING PROVIDER: Cephus Collin, MD  REFERRING DIAG: myofascial pain syndrome of thoracic spine  Rationale for Evaluation and Treatment: Rehabilitation  THERAPY DIAG:  Pain in thoracic spine  Muscle weakness (generalized)  ONSET DATE: October 2024 (approximately 3 months after rib fracture in July 2024)  SUBJECTIVE:  PERTINENT HISTORY:  Patient is a 68 y.o. male who presents to outpatient physical therapy with a referral for medical diagnosis of myofascial pain syndrome of thoracic spine. This patient's chief complaints consist of chronic right sided thoracic spine pain, right shoulder pain, and occasional paresthesia to the right UE, leading to the following functional deficits: limits him with everyday activities/tasks to some amount, working on projects, working on the computer, walking for exercise, sleeping, doing as much as he wants to. He never did get into a routine with the workout  program.   Relevant past medical history and comorbidities include bilateral shoulder surgery (RTC repair, R most recent 12/2020) and subarachnoid hemorrhage in 2017. Patient denies hx of cancer, seizures, unexplained weight loss, unexplained changes in bowel or bladder problems, unexplained stumbling or dropping things, back surgery.    SUBJECTIVE STATEMENT: Patient states he  has not been doing his HEP but he has been working more. He did a lot of work today on a floor, polishing it and putting it in and he is "beat up." He could barely lift his rogith arm a while ago. He would like to find a neutral position for his neck. He cannot find a pillow position for his neck. It is constantly irritated.   Per chart review 06/04/2023: Dr. Rhesa Celeste recommended continuing PT for neck and right shoulder at visit with him on 05/23/2023.   PAIN: Are you having pain? Yes;  NPRS:  5-8/10 R shoulder front to back especially when he lifts his arm, 6/10 posterior neck and bilateral lower trap, 5/10 Rt medial scapular zone,      FUNCTIONAL LIMITATIONS: limits him with everyday activities and tasks, working on projects, working on the computer, walking for exercise, sleeping, doing as much as he wants to  [employer]: working in the shop, building and fixing things  PRECAUTIONS: None   WEIGHT BEARING RESTRICTIONS: No   PLOF: Independent  PATIENT GOALS: to get rid of the pain, to get on an exercise and stretching regimen for his overall health, sleep with less difficulty from pain   OBJECTIVE (measurements last taken 06/05/2023)  PATIENT SPECIFIC FUNCTIONAL SCALE             1. Exercise: 6            2. Building/Using Tools: 7            3. Walking: 6            Average: 6.3/10   SPINE MOTION   CERVICAL SPINE AROM *Indicates pain Flexion: 35 pain/tightness at right UT Extension: 45  Side Flexion:       R 13 bilateral neck pain      L  13 bilateral neck pain Rotation:  R 69 deg ipsilateral neck  pain, contralateral tightness at upper C-spine L 44 deg contralateral neck pain, ipsilateral tightness at upper C-spine     SEATED THORACIC SPINE AROM *Indicates pain Flexion: 45 deg tightness in neck Extension: 10 deg low back pain Side Flexion:       R 25 deg ipsilateral lower rib pain (worst with return to sitting), contralateral tightness lumbar level      L 40 deg Rotation:  R 45 deg lower back stiffness L 50 deg lower back stiffness **increased pain at right periscapular region following measurements     NEUROLOGICAL Deep Tendon Reflexes R/L  2+/2+ Biceps brachii reflex (C5, C6) 2+/2+ Brachioradialis reflex (C6) 2+/2+ Triceps brachii reflex (C7)  MUSCLE PERFORMANCE (MMT):  *Indicates  pain 06/06/23 Date Date  Joint/Motion R/L R/L R/L  Shoulder     Flexion 4*/5 / /  Abduction (C5) 4-*/5 / /  External rotation 4*/5 / /  Internal rotation 5/5 / /  Extension / / /  Periscapular     Serratus A.  4-*/5 / /  Middle Trap 4+/4- / /  Lower Trap 3+/3+ / /  Elbow     Flexion (C6) 5*/5 / /  Extension (C7) 4+*/5 / /  Wrist     Flexion (C7) 5/5 / /  Extension (C6) 5/5 / /  Ulnar deviation (C8) 5/5 / /  Hand     Thumb extension (C8) B WNL / /  Finger adduction (T1) B WNL / /  Comments:  06/05/23: compensatory shoulder hike in R abduction  SPECIAL TESTS:  SHOULDER SPECIAL TESTS Drop arm test: R = negative Hawkins-Kennedy test: R = negative Infraspinatus test: R = positive   1RM TESTING Bent Over Row with contralateral UE braced (last tested 03/27/2023): 1 RM for Right Arm: 75.8  1 RM for Left Arm: 80.2   Bent Over Shoulder Scaption ("Y"):  (last tested 03/27/2023): 1 RM for Right Arm: 8.9  1 RM for Left Arm: 9.1   TREATMENT                                                                                                                           Self-Care/Home Management Training: to educate patient in self care including ADL training, meal preparation, compensatory  training, safety procedures/instructions, use of assistive technology devices or adaptive equipment.  Education on sleeping positions and supports with trial in clinic of different sleeping positions and pillows.   Education on mechanical traction and home traction options if he benefits from in in the clinic.   Manual therapy: to reduce pain and tissue tension, improve range of motion, neuromodulation, in order to promote improved ability to complete functional activities. HOOKLYING Manual traction: 2x20-30 seconds (pain relieving at cervical spine).  CPA at C6 reproduces neck pain  Mechanical Traction: to improve cervical spine and thoracic pain, and help decrease muscle tension.  Type: Cervical Min (lb): 0 Max (lb): 30 (gradually increased from 20-25-30 per patient tolerance and request) Hold time: 30 Rest time: 10 Pull angle: ~20 degrees Time: 15 plus set up and take down Result: decreased neck pain but increased rt periscapular pain with pull (resolved to baseline afterwards)  Pt required multimodal cuing for proper technique and to facilitate improved neuromuscular control, strength, range of motion, and functional ability resulting in improved performance and form.     PATIENT EDUCATION:  Education details: Education on diagnosis, prognosis, POC, anatomy and physiology of current condition. Mechanical traction. Home traction.  Person educated: Patient Education method: Explanation, Demonstration Education comprehension: verbalized understanding and needs further education  HOME EXERCISE PROGRAM: Access Code: [email or web address] URL: [email or web address] Date: 05/17/2023 Prepared by: Alleen Isle  Exercises - Dumbbell Squat at Shoulders  - 1 x  daily - 2-3 x weekly - 2 sets - 20 reps - Seated Thoracic Lumbar Extension  - 1 x daily - 1 sets - 20 reps - 5 seconds hold - Standing Row with Anchored Resistance  - 3 x weekly - 2 sets - 20 reps - Resistance Pulldown with  March  - 3 x weekly - 2 sets - 20 reps - Curator  - 3 x weekly - 2 sets - 1 min each arm carry time - Sitting Chest Press With Kettlebell  - 3 x weekly - 2 sets - 15 reps - Quadruped Cat Cow  - 1 x daily - 2 sets - 10 reps - Child's Pose with Sidebending  - 1 x daily - 2 sets - 30 hold - Quadruped Rock Back into [employer] Up  - 1 x daily - 1 sets - 10 reps - Prone Press Up  - 1 x daily - 2 sets - 10 reps - Standing Bent Over Single Arm Scapular Row with Table Support with PLB  - 3 x weekly - 2 sets - 15-20 reps - Single Arm Bent Over Shoulder Scaption with Dumbbell  - 3 x weekly - 2 sets - 15-20 reps - Sub-Occipital Cervical Stretch  - 1 x daily - 1 sets - 10 reps - 5 second hold - Thoracic Stretch on Foam Roll - Hands Clasped  - 1-2 sets - 10 reps - Snow Angels on Foam Roll  - 1 x daily - 2 sets - 10 reps - Prone Angels  - Seated Upper Trapezius Stretch  - 1-2 x daily - 2-3 reps - 60 seconds hold  HOME EXERCISE PROGRAM [[id number]] View at [email or web address]" using code: [email or web address] Prayer Stretch -  Repeat 10 Repetitions, Hold 2 Seconds, Complete 2 Sets, Perform 3 Times a Day  HOME EXERCISE PROGRAM [[id number]] View at [email or web address] using code: [id number] Suboccipital Stretch and C1-C2 Mobilization -  Repeat 20 Repetitions, Hold 2 Seconds, Perform 3 Times a Day  HOME EXERCISE PROGRAM [[email or web address]] View at [email or web address] using code [email or web address] LAT PULLDOWN - SINGLE ARM  -  Repeat 15 Repetitions, Complete 3 Sets, Perform 1 Times a Day  6 Day Strength-Mobility Exercise Program  Determining how much weight to use with exercise:  If the goal of the exercise is strength and hypertrophy (muscle growth), choose a heavier weight that is very challenging by the end of 8-12 reps.   If the goal of the exercise is muscle endurance, choose a lighter weight that feels very challenging after 10-15 reps (up to 20 reps).  If you can do over 20-30 reps with a weight, you  likely aren't challenging the muscles enough to make strength/endurance gains.   Repetition recommendations apply to reps that you can do with proper form. For example, if your form fails after 2-3 reps, only do 2-3 reps until you can do more with good form or reduce the weight you are using if possible.    3 Non-Consecutive Days Per Week  Circuits Exercise Sets Reps Rest Interval   Warm Up (can go through twice if needed)   Aerobic Warm Up: Walking (optional)   Modified Prayer Stretch 20 5 second hold    Squats (no weight) 1 10    Supine Snow angels on Foam Roll (head supported)  1 20    Main Workout   A1 Squats with dumbbells on shoulders (8# DBs) 2-3 15   A2 Bent over single arm row  (>=30#) 2-3 8-10  1-2-minute rest following completion of circuit A if desired  B1 Deadlifts with barbell (40# added to bar) 2-3 15   B2 Push ups (modified to surface) 2-3 15   Any stretches you prefer would be great following circuit B   ASSESSMENT:  CLINICAL IMPRESSION: Patient has attended 20 physical therapy sessions since starting current episode of care on 03/06/2023. He initially made some progress but developed neck pain and has intermittant right shoulder pain that is bothersome and limiting him since starting episode of care. He has gotten temporary and short term relief from manual therapy, dry needling, and some stretches, but minimal lasting changes, especially at his neck. He does report some improvement of right periscapular pain, but it still bothers him regularly. He has been ambitious with his exercise goals and HEP but has been having trouble participating in HEP lately and clinic visits have been sporadic due to scheduling difficulties. He has worsened again since his 10th visit progress note, but is not without potential for improvement. He has a lot of neck and thoracic spine stiffness, and does not have signs of nerve root irritation referring to his shoulder. He appears to have  increased muscle tension and pain with somatic pain radiating from the cervical and thoracic spine. Plan to continue with PT for 10 more visits and send back to referring clinician if no further improvements or worsening occur. Referring MD has seen him recently and recommended continuing PT for shoulder and neck pain. Neck pain appears to be stemming from C6 level. Trial of mechanical cervical traction this session to help assess for effectiveness and possible need for homeo unit need for home unit. Patient's neck pain decreased to 4/10 and he felt better afterwards. No effect on shoulder or periscapular pain. Patient would benefit from continued management of limiting condition by skilled physical therapist to address remaining impairments and functional limitations to work towards stated goals and return to PLOF or maximal functional independence.    OBJECTIVE IMPAIRMENTS: decreased activity tolerance, decreased endurance, decreased knowledge of condition, decreased mobility, difficulty walking, decreased ROM, decreased strength, hypomobility, impaired perceived functional ability, increased muscle spasms, impaired flexibility, impaired UE functional use, postural dysfunction, and pain.   ACTIVITY LIMITATIONS: carrying, lifting, bending, sitting, standing, squatting, sleeping, transfers, bed mobility, bathing, dressing, hygiene/grooming, locomotion level, and caring for others  PARTICIPATION LIMITATIONS: meal prep, cleaning, laundry, interpersonal relationship, driving, shopping, community activity, yard work, and everyday activities and tasks, working on projects, working on [employer], walking for exercise, sleeping, doing as much as he wants to   PERSONAL FACTORS: Age, Past/current experiences, Time since onset of injury/illness/exacerbation, and 3+ comorbidities: bilateral shoulder surgery (RTC repair, R most recent 12/2020), bilateral TKA, subarachnoid hemorrhage 2017 with SAH coiling, HTN, hx of  hepatitis C (cured), double hernia repair, plantar facial fibromatosis, chronic pain syndrome, former smoker, Chronic bilateral low back pain with bilateral sciatica; Acute bursitis of right shoulder; Lumbar facet arthropathy; Lumbar degenerative disc disease; Localized osteoarthritis of shoulder regions, bilateral; Chronic SI joint pain; Chronic pain syndrome; Pain in joint of left shoulder; Abnormal gait; Anatomical narrow angle; Biomechanical lesion; Cataract nuclear; Derangement of posterior horn of medial meniscus; Edema; Glaucoma suspect of both eyes; Hip pain; History of total knee arthroplasty; Localized, primary osteoarthritis; Hx of subarachnoid hemophage, Muscle weakness; Arthritis; Plantar fascial fibromatosis; Viral hepatitis C; Arthropathy of left wrist; Chronic pain of left heel; Intercostal neuralgia; and Closed fracture of rib of left side with routine healing,  has a past medical history of  Arthritis, Hepatitis, Hypertension, Subarachnoid hemorrhage (HCC) (2017), and Wears hearing aid in both ears.  has a past surgical history that includes Brain surgery; Joint replacement (Bilateral); Shoulder arthroscopy with rotator cuff repair and open biceps tenodesis (Left, 09/08/2018); and Hernia repair (1963). Also had surgery on right shoulder are also affecting patient's functional outcome.   REHAB POTENTIAL: Good  CLINICAL DECISION MAKING: Evolving/moderate complexity  EVALUATION COMPLEXITY: Moderate   GOALS: Goals reviewed with patient? No  SHORT TERM GOALS: Target date: 03/20/2023  Patient will be independent with initial home exercise program for self-management of symptoms. Baseline: Initial HEP to be provided at visit 2 as appropriate (03/06/23); continued participation in HEP (04/18/23); Goal status: MET   LONG TERM GOALS: Target date: 05/29/2023. Target date updated to 08/28/2023 for all unmet goals on 06/05/2023.  Patient will be independent with a long-term home exercise program  for self-management of symptoms.  Baseline: Initial HEP to be provided at visit 2 as appropriate (03/06/23); continued participation in HEP (04/18/23); not participating recently (06/05/2023);  Goal status: In progress  2.  Patient will demonstrate improved FOTO to equal or greater than 56 by visit #13 to demonstrate improvement in overall condition and self-reported functional ability.  Baseline: 48/100 (03/06/23); 47/100 (04/18/23); Goal status: Discontinued due to clinic discontinuing access to measure (06/05/2023)  3.  Patient will demonstrate full thoracic AROM without provocation of thoracic spine pain and tightness to improve his ability to complete tasks such as walking, sweeping, and washing dishes with less difficulty. Baseline: Limitations with extension/flexion and concordant pulling sensation with lateral flexion (03/06/23); 46 degrees of flexion, 15 degrees of extension, continued limitations in ROM with later flexion but not painful with motion (04/18/23); continues with limitations and pain - see objective (06/05/2023);  Goal status: Ongoing  4.  Patient will demonstrate full cervical AROM without provocation of UT pain and thoracic spine tightness to improve ability to complete ADLs with less difficulty.  Baseline: Limited flexion and left side bending causing UT pain (03/06/23); 44 degrees and no pain with cervical flexion, 11 degrees of left side bending with tightness but no pain (04/18/23); continues with limitations and pain in neck (06/05/2023);  Goal status: In progress  5.  Patient will demonstrate improvement in Patient Specific Functional Scale (PSFS) of equal or greater than 3 points to reflect clinically significant improvement in patient's most valued functional activities. Baseline: To be tested at visit 2 as appropriate (03/06/23); 5/10 at visit #2 (03/13/2023); 7/10 at visit #10 (04/18/23); 6.3/10 (06/05/2023);  Goal status: In progress   PLAN:  PT FREQUENCY:  1-2x/week  PT DURATION: 8-12 weeks  PLANNED INTERVENTIONS: 97164- PT Re-evaluation, 97110-Therapeutic exercises, 97530- Therapeutic activity, 97112- Neuromuscular re-education, 97535- Self Care, 09604- Manual therapy, 97014- Electrical stimulation (unattended), Patient/Family education, Dry Needling, Joint mobilization, Spinal mobilization, Cryotherapy, and Moist heat.  PLAN FOR NEXT SESSION: update HEP as appropriate, progressive postural/UE/functional strengthening, core strengthening, manual therapy as needed, education.   Carilyn Charles. Artemio Larry, PT, DPT 06/12/23, 7:23 PM  [facility] [address] [place], [place] [address] P: [phone] I F: [phone]

## 2023-06-18 ENCOUNTER — Ambulatory Visit: Admitting: Physical Therapy

## 2023-06-18 ENCOUNTER — Encounter: Payer: Self-pay | Admitting: Physical Therapy

## 2023-06-18 DIAGNOSIS — M546 Pain in thoracic spine: Secondary | ICD-10-CM

## 2023-06-18 DIAGNOSIS — M6281 Muscle weakness (generalized): Secondary | ICD-10-CM

## 2023-06-18 NOTE — Therapy (Unsigned)
 OUTPATIENT PHYSICAL THERAPY TREATMENT  Patient Name: Charles Mooney MRN: [id number] DOB:[birth year], 68 y.o., male Today's Date: 06/18/2023  END OF SESSION:  PT End of Session - 06/18/23 1918     Visit Number 21    Number of Visits 20    Date for PT Re-Evaluation 08/28/23    Authorization Type MEDICARE PART B reporting period from 06/12/2023    Progress Note Due on Visit 20    PT Start Time 0951    PT Stop Time 1040    PT Time Calculation (min) 49 min    Activity Tolerance Patient tolerated treatment well    Behavior During Therapy [facility] for tasks assessed/performed                Past Medical History:  Diagnosis Date   Arthritis    Hepatitis    c 2000   Hypertension    Subarachnoid hemorrhage (HCC) 2017   Wears hearing aid in both ears    Past Surgical History:  Procedure Laterality Date   BRAIN SURGERY     [facility] COILING 2017   HERNIA REPAIR  1963   double hernia repair   JOINT REPLACEMENT Bilateral    TKR  L-2012, R-2015   SHOULDER ARTHROSCOPY WITH ROTATOR CUFF REPAIR AND OPEN BICEPS TENODESIS Left 09/08/2018   Procedure: LEFT SHOULDER ARTHROSCOPY,SUBSACAPULARIS REPAIR, SUBACROMIAL DECOMP, DISTAL CLAVICLE EXCISION,BICEP TENODESIS, MINI OPEN REGENTEN PATCH APPLICATION;  Surgeon: Lorri Rota, MD;  Location: [facility] ORS;  Service: Orthopedics;  Laterality: Left;   Patient Active Problem List   Diagnosis Date Noted   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   Closed fracture of rib of left side with routine healing 09/04/2022   Chronic pain of left heel 08/31/2021   Arthropathy of left wrist 10/12/2020   Viral hepatitis C 11/02/2019   Lumbar facet arthropathy 04/27/2019   Lumbar degenerative disc disease 04/27/2019   Localized osteoarthritis of shoulder regions, bilateral 04/27/2019   Encounter for long-term opiate analgesic use 04/27/2019   Chronic SI joint pain 04/27/2019   Chronic pain syndrome 04/27/2019   Pain in joint of left shoulder 06/03/2018   Abnormal gait  06/18/2017   Anatomical narrow angle 06/18/2017   Derangement of posterior horn of medial meniscus 06/18/2017   Edema 06/18/2017   Hip pain 06/18/2017   History of total knee arthroplasty 06/18/2017   Localized, primary osteoarthritis 06/18/2017   Muscle weakness 06/18/2017   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   Acute bursitis of right shoulder 06/14/2017   Nonallopathic lesion of sacral region 06/14/2017   Nonallopathic lesion of thoracic region 06/14/2017   Nonallopathic lesion of lumbosacral region 06/14/2017   Biomechanical lesion 06/14/2017   Chronic bilateral low back pain with bilateral sciatica 05/23/2017   Arthritis 04/19/2013   Cataract nuclear 06/16/2012   Glaucoma suspect of both eyes 06/16/2012    PCP: Rex Castor, MD  REFERRING PROVIDER: Cephus Collin, MD  REFERRING DIAG: myofascial pain syndrome of thoracic spine  Rationale for Evaluation and Treatment: Rehabilitation  THERAPY DIAG:  Pain in thoracic spine  Muscle weakness (generalized)  ONSET DATE: October 2024 (approximately 3 months after rib fracture in July 2024)  SUBJECTIVE:  PERTINENT HISTORY:  Patient is a 68 y.o. male who presents to outpatient physical therapy with a referral for medical diagnosis of myofascial pain syndrome of thoracic spine. This patient's chief complaints consist of chronic right sided thoracic spine pain, right shoulder pain, and occasional paresthesia to the right UE, leading to the following functional deficits: limits him with everyday activities/tasks to some amount, working on projects, working on the computer, walking for exercise, sleeping, doing as much as he wants to. He never did get into a routine with the workout program.   Relevant past medical history and comorbidities  include bilateral shoulder surgery (RTC repair, R most recent 12/2020) and subarachnoid hemorrhage in 2017. Patient denies hx of cancer, seizures, unexplained weight loss, unexplained changes in bowel or bladder problems, unexplained stumbling or dropping things, back surgery.    SUBJECTIVE STATEMENT: Patient states he tweaked his back this weekend. He woke up with pain in his back going down the side of his left leg on Sunday. The day before he spent a lot of time working on polishing the floors which involved bending and using the polisher. He has been vacuuming every day and really noticed his back pain when using the vacuum (rotated to the left). The leg pain was present yesterday but not today. He did not have to modify or stop working on the floor. His neck felt a little better after last PT session and he feels that mechanical traction helped. His neck felt better to the end of Saturday. His right shoulder/periscapular pain is feeling better, despite continuing the same activity that was irritating it. He continues to notice that his right low back cramps when he is doing his left UT stretch and the left low back cramps when he stretches his right UT, this is worse on the right low back and seems to be getting worse.   Per chart review 06/04/2023: Dr. Rhesa Celeste recommended continuing PT for neck and right shoulder at visit with him on 05/23/2023.   PAIN: Are you having pain? Yes;  NPRS:  7/10 bilateral low back towards iliac crests (feels "like a cramp"), 6/10 neck pain at base of neck and over bilateral UT (L > R), 3/10 shoulder pain is okay, 4-5/10 right periscapular pain       FUNCTIONAL LIMITATIONS: limits him with everyday activities and tasks, working on projects, working on the computer, walking for exercise, sleeping, doing as much as he wants to  [employer]: working in the shop, building and fixing things  PRECAUTIONS: None   WEIGHT BEARING RESTRICTIONS: No   PLOF:  Independent  PATIENT GOALS: to get rid of the pain, to get on an exercise and stretching regimen for his overall health, sleep with less difficulty from pain   OBJECTIVE   1RM TESTING Bent Over Row with contralateral UE braced (last tested 03/27/2023): 1 RM for Right Arm: 75.8  1 RM for Left Arm: 80.2   Bent Over Shoulder Scaption ("Y"):  (last tested 03/27/2023): 1 RM for Right Arm: 8.9  1 RM for Left Arm: 9.1   TREATMENT  Therapeutic exercise: therapeutic exercises that incorporate ONE parameter at one or more areas of the body to centralize symptoms, develop strength and endurance, range of motion, and flexibility required for successful completion of functional activities.  NuStep using bilateral upper and lower extremities. For improved extremity mobility, muscular endurance, and activity tolerance; and to induce the analgesic effect of aerobic exercise, stimulate improved joint nutrition, and prepare body structures and systems for following interventions. Also to reinforce understanding of appropriate exercise intensity to help meet physical activity guidelines for health.  Seat/handle setting: 12/13 7:50  minutes Level: 5 Target SPM: > 100 Average SPM: 103 RPE: 6/10 Towel roll behind back   Manual therapy: to reduce pain and tissue tension, improve range of motion, neuromodulation, in order to promote improved ability to complete functional activities. PRONE CPA and bilateral UPA grade III-IV along all segments of cervical spine and upper thoracic spine. Multiple bouts of up to 30 seconds per segment to improve motion and pain.   SEATED Cervical spine rotation mobilization blocking one spinous process at a time while passively rotating head to end range to improve facet joint mobility. Completed at C2, mid cervical spine, and lower cervical spine, bilaterally  up to 30 second bouts.   35 min outside of traction  Mechanical Traction: to improve cervical spine and thoracic pain, and help decrease muscle tension.  Type: Cervical Min (lb): 0 Max (lb): 35  Hold time: 30 seconds Rest time: 10 seconds Pull angle: ~20 degrees Time: 15 plus set up and take down Result: feels better after. Good tolerance.   Pt required multimodal cuing for proper technique and to facilitate improved neuromuscular control, strength, range of motion, and functional ability resulting in improved performance and form.     PATIENT EDUCATION:  Education details: Education on diagnosis, prognosis, POC, anatomy and physiology of current condition. Mechanical traction. Home traction.  Person educated: Patient Education method: Explanation, Demonstration Education comprehension: verbalized understanding and needs further education  HOME EXERCISE PROGRAM: Access Code: [email or web address] URL: [email or web address] Date: 05/17/2023 Prepared by: Alleen Isle  Exercises - Dumbbell Squat at Shoulders  - 1 x daily - 2-3 x weekly - 2 sets - 20 reps - Seated Thoracic Lumbar Extension  - 1 x daily - 1 sets - 20 reps - 5 seconds hold - Standing Row with Anchored Resistance  - 3 x weekly - 2 sets - 20 reps - Resistance Pulldown with March  - 3 x weekly - 2 sets - 20 reps - Curator  - 3 x weekly - 2 sets - 1 min each arm carry time - Sitting Chest Press With Kettlebell  - 3 x weekly - 2 sets - 15 reps - Quadruped Cat Cow  - 1 x daily - 2 sets - 10 reps - Child's Pose with Sidebending  - 1 x daily - 2 sets - 30 hold - Quadruped Rock Back into [employer] Up  - 1 x daily - 1 sets - 10 reps - Prone Press Up  - 1 x daily - 2 sets - 10 reps - Standing Bent Over Single Arm Scapular Row with Table Support with PLB  - 3 x weekly - 2 sets - 15-20 reps - Single Arm Bent Over Shoulder Scaption with Dumbbell  - 3 x weekly - 2 sets - 15-20 reps - Sub-Occipital Cervical  Stretch  - 1 x daily - 1 sets - 10 reps - 5 second hold - Thoracic Stretch on Foam Roll - Hands  Clasped  - 1-2 sets - 10 reps - Snow Angels on Foam Roll  - 1 x daily - 2 sets - 10 reps - Prone Angels  - Seated Upper Trapezius Stretch  - 1-2 x daily - 2-3 reps - 60 seconds hold  HOME EXERCISE PROGRAM [[email or web address]] View at [email or web address]" using code: [email or web address] Prayer Stretch -  Repeat 10 Repetitions, Hold 2 Seconds, Complete 2 Sets, Perform 3 Times a Day  HOME EXERCISE PROGRAM [[id number]] View at [email or web address] using code: [id number] Suboccipital Stretch and C1-C2 Mobilization -  Repeat 20 Repetitions, Hold 2 Seconds, Perform 3 Times a Day  HOME EXERCISE PROGRAM [[email or web address]] View at [email or web address] using code [email or web address] LAT PULLDOWN - SINGLE ARM  -  Repeat 15 Repetitions, Complete 3 Sets, Perform 1 Times a Day  6 Day Strength-Mobility Exercise Program  Determining how much weight to use with exercise:  If the goal of the exercise is strength and hypertrophy (muscle growth), choose a heavier weight that is very challenging by the end of 8-12 reps.   If the goal of the exercise is muscle endurance, choose a lighter weight that feels very challenging after 10-15 reps (up to 20 reps).  If you can do over 20-30 reps with a weight, you likely aren't challenging the muscles enough to make strength/endurance gains.   Repetition recommendations apply to reps that you can do with proper form. For example, if your form fails after 2-3 reps, only do 2-3 reps until you can do more with good form or reduce the weight you are using if possible.    3 Non-Consecutive Days Per Week  Circuits Exercise Sets Reps Rest Interval   Warm Up (can go through twice if needed)   Aerobic Warm Up: Walking (optional)   Modified Prayer Stretch 20 5 second hold    Squats (no weight) 1 10    Supine Snow angels on Foam Roll (head supported)  1 20    Main Workout   A1 Squats with dumbbells on shoulders  (8# DBs) 2-3 15   A2 Bent over single arm row  (>=30#) 2-3 8-10   1-2-minute rest following completion of circuit A if desired  B1 Deadlifts with barbell (40# added to bar) 2-3 15   B2 Push ups (modified to surface) 2-3 15   Any stretches you prefer would be great following circuit B   ASSESSMENT:  CLINICAL IMPRESSION: Patient with increased back pain today and continued neck pain. He had some relief in the cervical spine from mechanical traction following last PT session, so it was repeated today with slight increase in force with good tolerance. Also worked on improving facet joint motion in the neck and upper cervical spine bilaterally with manual therapy. Patient found it uncomfortable, with most reproduction of pain near C6, and said he felt "beat up" after manual. Patient would benefit from continued management of limiting condition by skilled physical therapist to address remaining impairments and functional limitations to work towards stated goals and return to PLOF or maximal functional independence.     OBJECTIVE IMPAIRMENTS: decreased activity tolerance, decreased endurance, decreased knowledge of condition, decreased mobility, difficulty walking, decreased ROM, decreased strength, hypomobility, impaired perceived functional ability, increased muscle spasms, impaired flexibility, impaired UE functional use, postural dysfunction, and pain.   ACTIVITY LIMITATIONS: carrying, lifting, bending, sitting, standing, squatting, sleeping, transfers, bed mobility, bathing, dressing, hygiene/grooming, locomotion level, and caring for others  PARTICIPATION LIMITATIONS: meal prep, cleaning, laundry, interpersonal relationship, driving, shopping, community activity, yard work, and everyday  activities and tasks, working on projects, working on the computer, walking for exercise, sleeping, doing as much as he wants to   PERSONAL FACTORS: Age, Past/current experiences, Time since onset of  injury/illness/exacerbation, and 3+ comorbidities: bilateral shoulder surgery (RTC repair, R most recent 12/2020), bilateral TKA, subarachnoid hemorrhage 2017 with SAH coiling, HTN, hx of hepatitis C (cured), double hernia repair, plantar facial fibromatosis, chronic pain syndrome, former smoker, Chronic bilateral low back pain with bilateral sciatica; Acute bursitis of right shoulder; Lumbar facet arthropathy; Lumbar degenerative disc disease; Localized osteoarthritis of shoulder regions, bilateral; Chronic SI joint pain; Chronic pain syndrome; Pain in joint of left shoulder; Abnormal gait; Anatomical narrow angle; Biomechanical lesion; Cataract nuclear; Derangement of posterior horn of medial meniscus; Edema; Glaucoma suspect of both eyes; Hip pain; History of total knee arthroplasty; Localized, primary osteoarthritis; Hx of subarachnoid hemophage, Muscle weakness; Arthritis; Plantar fascial fibromatosis; Viral hepatitis C; Arthropathy of left wrist; Chronic pain of left heel; Intercostal neuralgia; and Closed fracture of rib of left side with routine healing,  has a past medical history of Arthritis, Hepatitis, Hypertension, Subarachnoid hemorrhage (HCC) (2017), and Wears hearing aid in both ears.  has a past surgical history that includes Brain surgery; Joint replacement (Bilateral); Shoulder arthroscopy with rotator cuff repair and open biceps tenodesis (Left, 09/08/2018); and Hernia repair (1963). Also had surgery on right shoulder are also affecting patient's functional outcome.   REHAB POTENTIAL: Good  CLINICAL DECISION MAKING: Evolving/moderate complexity  EVALUATION COMPLEXITY: Moderate   GOALS: Goals reviewed with patient? No  SHORT TERM GOALS: Target date: 03/20/2023  Patient will be independent with initial home exercise program for self-management of symptoms. Baseline: Initial HEP to be provided at visit 2 as appropriate (03/06/23); continued participation in HEP (04/18/23); Goal status:  MET   LONG TERM GOALS: Target date: 05/29/2023. Target date updated to 08/28/2023 for all unmet goals on 06/05/2023.  Patient will be independent with a long-term home exercise program for self-management of symptoms.  Baseline: Initial HEP to be provided at visit 2 as appropriate (03/06/23); continued participation in HEP (04/18/23); not participating recently (06/05/2023);  Goal status: In progress  2.  Patient will demonstrate improved FOTO to equal or greater than 56 by visit #13 to demonstrate improvement in overall condition and self-reported functional ability.  Baseline: 48/100 (03/06/23); 47/100 (04/18/23); Goal status: Discontinued due to clinic discontinuing access to measure (06/05/2023)  3.  Patient will demonstrate full thoracic AROM without provocation of thoracic spine pain and tightness to improve his ability to complete tasks such as walking, sweeping, and washing dishes with less difficulty. Baseline: Limitations with extension/flexion and concordant pulling sensation with lateral flexion (03/06/23); 46 degrees of flexion, 15 degrees of extension, continued limitations in ROM with later flexion but not painful with motion (04/18/23); continues with limitations and pain - see objective (06/05/2023);  Goal status: Ongoing  4.  Patient will demonstrate full cervical AROM without provocation of UT pain and thoracic spine tightness to improve ability to complete ADLs with less difficulty.  Baseline: Limited flexion and left side bending causing UT pain (03/06/23); 44 degrees and no pain with cervical flexion, 11 degrees of left side bending with tightness but no pain (04/18/23); continues with limitations and pain in neck (06/05/2023);  Goal status: In progress  5.  Patient will demonstrate improvement in Patient Specific Functional Scale (PSFS) of equal or greater than 3 points to reflect clinically significant improvement in patient's most valued functional activities. Baseline: To be tested  at visit 2  as appropriate (03/06/23); 5/10 at visit #2 (03/13/2023); 7/10 at visit #10 (04/18/23); 6.3/10 (06/05/2023);  Goal status: In progress   PLAN:  PT FREQUENCY: 1-2x/week  PT DURATION: 8-12 weeks  PLANNED INTERVENTIONS: 97164- PT Re-evaluation, 97110-Therapeutic exercises, 97530- Therapeutic activity, 97112- Neuromuscular re-education, 97535- Self Care, 16109- Manual therapy, 97014- Electrical stimulation (unattended), Patient/Family education, Dry Needling, Joint mobilization, Spinal mobilization, Cryotherapy, and Moist heat.  PLAN FOR NEXT SESSION: update HEP as appropriate, progressive postural/UE/functional strengthening, core strengthening, manual therapy as needed, education.   Carilyn Charles. Artemio Larry, PT, DPT 06/18/23, 7:21 PM  [facility] [address] [place], [place] [address] P: [phone] I F: [phone]

## 2023-06-20 ENCOUNTER — Ambulatory Visit: Attending: Student in an Organized Health Care Education/Training Program | Admitting: Physical Therapy

## 2023-06-20 ENCOUNTER — Encounter: Payer: Self-pay | Admitting: Physical Therapy

## 2023-06-20 DIAGNOSIS — M6281 Muscle weakness (generalized): Secondary | ICD-10-CM | POA: Diagnosis present

## 2023-06-20 DIAGNOSIS — M5459 Other low back pain: Secondary | ICD-10-CM | POA: Insufficient documentation

## 2023-06-20 DIAGNOSIS — M546 Pain in thoracic spine: Secondary | ICD-10-CM | POA: Insufficient documentation

## 2023-06-20 NOTE — Therapy (Signed)
 OUTPATIENT PHYSICAL THERAPY TREATMENT  Patient Name: Charles Mooney MRN: [id number] DOB:12-May-1955, 68 y.o., male Today's Date: 06/20/2023  END OF SESSION:  PT End of Session - 06/20/23 1624     Visit Number 22    Number of Visits 35    Date for PT Re-Evaluation 08/28/23    Authorization Type MEDICARE PART B reporting period from 06/12/2023    Progress Note Due on Visit 20    PT Start Time 0948    PT Stop Time 1048    PT Time Calculation (min) 60 min    Activity Tolerance Patient tolerated treatment well    Behavior During Therapy [facility] for tasks assessed/performed                 Past Medical History:  Diagnosis Date   Arthritis    Hepatitis    c 2000   Hypertension    Subarachnoid hemorrhage (HCC) 2017   Wears hearing aid in both ears    Past Surgical History:  Procedure Laterality Date   BRAIN SURGERY     [facility] COILING 2017   HERNIA REPAIR  1963   double hernia repair   JOINT REPLACEMENT Bilateral    TKR  L-2012, R-2015   SHOULDER ARTHROSCOPY WITH ROTATOR CUFF REPAIR AND OPEN BICEPS TENODESIS Left 09/08/2018   Procedure: LEFT SHOULDER ARTHROSCOPY,SUBSACAPULARIS REPAIR, SUBACROMIAL DECOMP, DISTAL CLAVICLE EXCISION,BICEP TENODESIS, MINI OPEN REGENTEN PATCH APPLICATION;  Surgeon: Lorri Rota, MD;  Location: [facility] ORS;  Service: Orthopedics;  Laterality: Left;   Patient Active Problem List   Diagnosis Date Noted   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   Closed fracture of rib of left side with routine healing 09/04/2022   Chronic pain of left heel 08/31/2021   Arthropathy of left wrist 10/12/2020   Viral hepatitis C 11/02/2019   Lumbar facet arthropathy 04/27/2019   Lumbar degenerative disc disease 04/27/2019   Localized osteoarthritis of shoulder regions, bilateral 04/27/2019   Encounter for long-term opiate analgesic use 04/27/2019   Chronic SI joint pain 04/27/2019   Chronic pain syndrome 04/27/2019   Pain in joint of left shoulder 06/03/2018   Abnormal gait  06/18/2017   Anatomical narrow angle 06/18/2017   Derangement of posterior horn of medial meniscus 06/18/2017   Edema 06/18/2017   Hip pain 06/18/2017   History of total knee arthroplasty 06/18/2017   Localized, primary osteoarthritis 06/18/2017   Muscle weakness 06/18/2017   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   Acute bursitis of right shoulder 06/14/2017   Nonallopathic lesion of sacral region 06/14/2017   Nonallopathic lesion of thoracic region 06/14/2017   Nonallopathic lesion of lumbosacral region 06/14/2017   Biomechanical lesion 06/14/2017   Chronic bilateral low back pain with bilateral sciatica 05/23/2017   Arthritis 04/19/2013   Cataract nuclear 06/16/2012   Glaucoma suspect of both eyes 06/16/2012    PCP: Rex Castor, MD  REFERRING PROVIDER: Cephus Collin, MD  REFERRING DIAG: myofascial pain syndrome of thoracic spine  Rationale for Evaluation and Treatment: Rehabilitation  THERAPY DIAG:  Pain in thoracic spine  Muscle weakness (generalized)  ONSET DATE: October 2024 (approximately 3 months after rib fracture in July 2024)  SUBJECTIVE:  PERTINENT HISTORY:  Patient is a 68 y.o. male who presents to outpatient physical therapy with a referral for medical diagnosis of myofascial pain syndrome of thoracic spine. This patient's chief complaints consist of chronic right sided thoracic spine pain, right shoulder pain, and occasional paresthesia to the right UE, leading to the following functional deficits: limits him with everyday activities/tasks to some amount, working on projects, working on the computer, walking for exercise, sleeping, doing as much as he wants to. He never did get into a routine with the workout program.   Relevant past medical history and comorbidities  include bilateral shoulder surgery (RTC repair, R most recent 12/2020) and subarachnoid hemorrhage in 2017. Patient denies hx of cancer, seizures, unexplained weight loss, unexplained changes in bowel or bladder problems, unexplained stumbling or dropping things, back surgery.    SUBJECTIVE STATEMENT: Patient states he is doing okay. He has a lot of work to do and has continued to work on the floors which includes looking down the whole time. He states he states his neck felt a lot better after last PT session. After last PT session the pain in his neck was gone but it was really sore. Neck was still painful that night but the next day it was steadily improving. He has not been able to find a neutral position to get the pain to go away once it starts but yesterday he found that position and used the TENS unit, then had no pain over night in the neck. However ,the R shoulder hurt all night and when he did his foam roller angel stretch this morning he could not get past abduction due to pain in the front of the shoulder and through the joint. It has felt like this but much less in the past. The R periscapular pain has been staying fairly constant at a lower level (including sleeping), but the only area that is really bothering him when sleeping is the R shoulder. Left low back has gotten better. He would like to repeat intervention for the neck and it would be great of PT can help his shoulder feel better.   Per chart review 06/04/2023: Dr. Rhesa Celeste recommended continuing PT for neck and right shoulder at visit with him on 05/23/2023.   PAIN: Are you having pain? Yes;  NPRS: 5/10 neck, 7/10 R shoulder, 4/10 R periscapular region, 3/10 left low back (normal level there)      FUNCTIONAL LIMITATIONS: limits him with everyday activities and tasks, working on projects, working on the computer, walking for exercise, sleeping, doing as much as he wants to  [employer]: working in the shop, building and fixing  things  PRECAUTIONS: None   WEIGHT BEARING RESTRICTIONS: No   PLOF: Independent  PATIENT GOALS: to get rid of the pain, to get on an exercise and stretching regimen for his overall health, sleep with less difficulty from pain   OBJECTIVE   1RM TESTING Bent Over Row with contralateral UE braced (last tested 03/27/2023): 1 RM for Right Arm: 75.8  1 RM for Left Arm: 80.2   Bent Over Shoulder Scaption ("Y"):  (last tested 03/27/2023): 1 RM for Right Arm: 8.9  1 RM for Left Arm: 9.1   TREATMENT  Therapeutic exercise: therapeutic exercises that incorporate ONE parameter at one or more areas of the body to centralize symptoms, develop strength and endurance, range of motion, and flexibility required for successful completion of functional activities.  Seated cervical spine rotational SNAG with pillow case edge Completed a few reps to the left, then stopped due to sharp spasm of the lower thoracic/upper lumbar spine.   Asterisk sign: Supine flexion and abduction: pain at top/back of shoulder  Manual therapy: to reduce pain and tissue tension, improve range of motion, neuromodulation, in order to promote improved ability to complete functional activities.  SUPINE/HOOKLYING R shoulder PROM to check symptom response and response to other interventions.  R shoulder grade III-IV GHJ mobs:  Caudal with shoulder in varying degrees of abduction: 3x30 seconds AP glide with shoulder in neutral: 3x30 seconds Distraction: 1x30 seconds STM to R subscap and lat  PRONE CPA and bilateral UPA grade III-IV along all segments of cervical spine and upper thoracic spine. Multiple bouts of up to 30 seconds per segment to improve motion and pain.      Mechanical Traction: to improve cervical spine and thoracic pain, and help decrease muscle tension.  Type: Cervical Min (lb): 0 Max  (lb): 35  Hold time: 30 seconds Rest time: 10 seconds Pull angle: ~20 degrees Time: 15 plus set up and take down Result: feels better after. Good tolerance.   Pt required multimodal cuing for proper technique and to facilitate improved neuromuscular control, strength, range of motion, and functional ability resulting in improved performance and form.     PATIENT EDUCATION:  Education details: Education on diagnosis, prognosis, POC, anatomy and physiology of current condition. Mechanical traction. Home traction.  Person educated: Patient Education method: Explanation, Demonstration Education comprehension: verbalized understanding and needs further education  HOME EXERCISE PROGRAM: Access Code: [email or web address] URL: [email or web address] Date: 05/17/2023 Prepared by: Alleen Isle  Exercises - Dumbbell Squat at Shoulders  - 1 x daily - 2-3 x weekly - 2 sets - 20 reps - Seated Thoracic Lumbar Extension  - 1 x daily - 1 sets - 20 reps - 5 seconds hold - Standing Row with Anchored Resistance  - 3 x weekly - 2 sets - 20 reps - Resistance Pulldown with March  - 3 x weekly - 2 sets - 20 reps - Curator  - 3 x weekly - 2 sets - 1 min each arm carry time - Sitting Chest Press With Kettlebell  - 3 x weekly - 2 sets - 15 reps - Quadruped Cat Cow  - 1 x daily - 2 sets - 10 reps - Child's Pose with Sidebending  - 1 x daily - 2 sets - 30 hold - Quadruped Rock Back into [employer] Up  - 1 x daily - 1 sets - 10 reps - Prone Press Up  - 1 x daily - 2 sets - 10 reps - Standing Bent Over Single Arm Scapular Row with Table Support with PLB  - 3 x weekly - 2 sets - 15-20 reps - Single Arm Bent Over Shoulder Scaption with Dumbbell  - 3 x weekly - 2 sets - 15-20 reps - Sub-Occipital Cervical Stretch  - 1 x daily - 1 sets - 10 reps - 5 second hold - Thoracic Stretch on Foam Roll - Hands Clasped  - 1-2 sets - 10 reps - Snow Angels on Foam Roll  - 1 x daily - 2 sets - 10 reps -  Prone Angels  - Seated  Upper Trapezius Stretch  - 1-2 x daily - 2-3 reps - 60 seconds hold  HOME EXERCISE PROGRAM [[email or web address]] View at "[email or web address]" using code: [email or web address] Prayer Stretch -  Repeat 10 Repetitions, Hold 2 Seconds, Complete 2 Sets, Perform 3 Times a Day  HOME EXERCISE PROGRAM [[id number]] View at [email or web address] using code: [id number] Suboccipital Stretch and C1-C2 Mobilization -  Repeat 20 Repetitions, Hold 2 Seconds, Perform 3 Times a Day  HOME EXERCISE PROGRAM [[email or web address]] View at [email or web address] using code [email or web address] LAT PULLDOWN - SINGLE ARM  -  Repeat 15 Repetitions, Complete 3 Sets, Perform 1 Times a Day  6 Day Strength-Mobility Exercise Program  Determining how much weight to use with exercise:  If the goal of the exercise is strength and hypertrophy (muscle growth), choose a heavier weight that is very challenging by the end of 8-12 reps.   If the goal of the exercise is muscle endurance, choose a lighter weight that feels very challenging after 10-15 reps (up to 20 reps).  If you can do over 20-30 reps with a weight, you likely aren't challenging the muscles enough to make strength/endurance gains.   Repetition recommendations apply to reps that you can do with proper form. For example, if your form fails after 2-3 reps, only do 2-3 reps until you can do more with good form or reduce the weight you are using if possible.    3 Non-Consecutive Days Per Week  Circuits Exercise Sets Reps Rest Interval   Warm Up (can go through twice if needed)   Aerobic Warm Up: Walking (optional)   Modified Prayer Stretch 20 5 second hold    Squats (no weight) 1 10    Supine Snow angels on Foam Roll (head supported)  1 20    Main Workout   A1 Squats with dumbbells on shoulders (8# DBs) 2-3 15   A2 Bent over single arm row  (>=30#) 2-3 8-10   1-2-minute rest following completion of circuit A if desired  B1 Deadlifts with barbell (40# added to bar) 2-3 15   B2  Push ups (modified to surface) 2-3 15   Any stretches you prefer would be great following circuit B   ASSESSMENT:  CLINICAL IMPRESSION: Patient with decreased neck pain in response to last PT session but with more bothersome R shoulder pain. Session focused on manual therapy to R shoulder and repeated some manual therapy at the neck with mechanical traction to decrease neck pain and shoulder pain. Patient with improved AROM/PROM/pain at the right shoulder after manual techniques applied to shoulder. Patient would benefit from continued management of limiting condition by skilled physical therapist to address remaining impairments and functional limitations to work towards stated goals and return to PLOF or maximal functional independence.      OBJECTIVE IMPAIRMENTS: decreased activity tolerance, decreased endurance, decreased knowledge of condition, decreased mobility, difficulty walking, decreased ROM, decreased strength, hypomobility, impaired perceived functional ability, increased muscle spasms, impaired flexibility, impaired UE functional use, postural dysfunction, and pain.   ACTIVITY LIMITATIONS: carrying, lifting, bending, sitting, standing, squatting, sleeping, transfers, bed mobility, bathing, dressing, hygiene/grooming, locomotion level, and caring for others  PARTICIPATION LIMITATIONS: meal prep, cleaning, laundry, interpersonal relationship, driving, shopping, community activity, yard work, and everyday activities and tasks, working on projects, working on [employer], walking for exercise, sleeping, doing as much as he wants to   PERSONAL FACTORS: Age, Past/current experiences, Time since onset of injury/illness/exacerbation, and 3+ comorbidities: bilateral shoulder surgery (RTC repair, R most recent 12/2020), bilateral TKA,  subarachnoid hemorrhage 2017 with SAH coiling, HTN, hx of hepatitis C (cured), double hernia repair, plantar facial fibromatosis, chronic pain syndrome, former  smoker, Chronic bilateral low back pain with bilateral sciatica; Acute bursitis of right shoulder; Lumbar facet arthropathy; Lumbar degenerative disc disease; Localized osteoarthritis of shoulder regions, bilateral; Chronic SI joint pain; Chronic pain syndrome; Pain in joint of left shoulder; Abnormal gait; Anatomical narrow angle; Biomechanical lesion; Cataract nuclear; Derangement of posterior horn of medial meniscus; Edema; Glaucoma suspect of both eyes; Hip pain; History of total knee arthroplasty; Localized, primary osteoarthritis; Hx of subarachnoid hemophage, Muscle weakness; Arthritis; Plantar fascial fibromatosis; Viral hepatitis C; Arthropathy of left wrist; Chronic pain of left heel; Intercostal neuralgia; and Closed fracture of rib of left side with routine healing,  has a past medical history of Arthritis, Hepatitis, Hypertension, Subarachnoid hemorrhage (HCC) (2017), and Wears hearing aid in both ears.  has a past surgical history that includes Brain surgery; Joint replacement (Bilateral); Shoulder arthroscopy with rotator cuff repair and open biceps tenodesis (Left, 09/08/2018); and Hernia repair (1963). Also had surgery on right shoulder are also affecting patient's functional outcome.   REHAB POTENTIAL: Good  CLINICAL DECISION MAKING: Evolving/moderate complexity  EVALUATION COMPLEXITY: Moderate   GOALS: Goals reviewed with patient? No  SHORT TERM GOALS: Target date: 03/20/2023  Patient will be independent with initial home exercise program for self-management of symptoms. Baseline: Initial HEP to be provided at visit 2 as appropriate (03/06/23); continued participation in HEP (04/18/23); Goal status: MET   LONG TERM GOALS: Target date: 05/29/2023. Target date updated to 08/28/2023 for all unmet goals on 06/05/2023.  Patient will be independent with a long-term home exercise program for self-management of symptoms.  Baseline: Initial HEP to be provided at visit 2 as appropriate  (03/06/23); continued participation in HEP (04/18/23); not participating recently (06/05/2023);  Goal status: In progress  2.  Patient will demonstrate improved FOTO to equal or greater than 56 by visit #13 to demonstrate improvement in overall condition and self-reported functional ability.  Baseline: 48/100 (03/06/23); 47/100 (04/18/23); Goal status: Discontinued due to clinic discontinuing access to measure (06/05/2023)  3.  Patient will demonstrate full thoracic AROM without provocation of thoracic spine pain and tightness to improve his ability to complete tasks such as walking, sweeping, and washing dishes with less difficulty. Baseline: Limitations with extension/flexion and concordant pulling sensation with lateral flexion (03/06/23); 46 degrees of flexion, 15 degrees of extension, continued limitations in ROM with later flexion but not painful with motion (04/18/23); continues with limitations and pain - see objective (06/05/2023);  Goal status: Ongoing  4.  Patient will demonstrate full cervical AROM without provocation of UT pain and thoracic spine tightness to improve ability to complete ADLs with less difficulty.  Baseline: Limited flexion and left side bending causing UT pain (03/06/23); 44 degrees and no pain with cervical flexion, 11 degrees of left side bending with tightness but no pain (04/18/23); continues with limitations and pain in neck (06/05/2023);  Goal status: In progress  5.  Patient will demonstrate improvement in Patient Specific Functional Scale (PSFS) of equal or greater than 3 points to reflect clinically significant improvement in patient's most valued functional activities. Baseline: To be tested at visit 2 as appropriate (03/06/23); 5/10 at visit #2 (03/13/2023); 7/10 at visit #10 (04/18/23); 6.3/10 (06/05/2023);  Goal status: In progress   PLAN:  PT FREQUENCY: 1-2x/week  PT DURATION: 8-12 weeks  PLANNED INTERVENTIONS: 97164- PT Re-evaluation, 97110-Therapeutic  exercises, 97530- Therapeutic activity, [id number]- Neuromuscular re-education, 97535-  Self Care, 30865- Manual therapy, 97014- Electrical stimulation (unattended), Patient/Family education, Dry Needling, Joint mobilization, Spinal mobilization, Cryotherapy, and Moist heat.  PLAN FOR NEXT SESSION: update HEP as appropriate, progressive postural/UE/functional strengthening, core strengthening, manual therapy as needed, education.   Carilyn Charles. Artemio Larry, PT, DPT 06/20/23, 4:25 PM  [facility] [address] [place], [place] [address] P: [phone] I F: [phone]

## 2023-06-24 ENCOUNTER — Ambulatory Visit: Admitting: Physical Therapy

## 2023-06-24 ENCOUNTER — Encounter: Payer: Self-pay | Admitting: Physical Therapy

## 2023-06-24 VITALS — BP 134/82

## 2023-06-24 DIAGNOSIS — M6281 Muscle weakness (generalized): Secondary | ICD-10-CM

## 2023-06-24 DIAGNOSIS — M546 Pain in thoracic spine: Secondary | ICD-10-CM | POA: Diagnosis not present

## 2023-06-24 NOTE — Therapy (Signed)
 OUTPATIENT PHYSICAL THERAPY TREATMENT  Patient Name: Charles Mooney MRN: [id number] DOB:09-11-1955, 68 y.o., male Today's Date: 06/24/2023  END OF SESSION:  PT End of Session - 06/24/23 1356     Visit Number 23    Number of Visits 35    Date for PT Re-Evaluation 08/28/23    Authorization Type MEDICARE PART B reporting period from 06/12/2023    Progress Note Due on Visit 20    PT Start Time 1345    PT Stop Time 1455    PT Time Calculation (min) 70 min    Activity Tolerance Patient tolerated treatment well    Behavior During Therapy [facility] for tasks assessed/performed                  Past Medical History:  Diagnosis Date   Arthritis    Hepatitis    c 2000   Hypertension    Subarachnoid hemorrhage (HCC) 2017   Wears hearing aid in both ears    Past Surgical History:  Procedure Laterality Date   BRAIN SURGERY     [facility] COILING 2017   HERNIA REPAIR  1963   double hernia repair   JOINT REPLACEMENT Bilateral    TKR  L-2012, R-2015   SHOULDER ARTHROSCOPY WITH ROTATOR CUFF REPAIR AND OPEN BICEPS TENODESIS Left 09/08/2018   Procedure: LEFT SHOULDER ARTHROSCOPY,SUBSACAPULARIS REPAIR, SUBACROMIAL DECOMP, DISTAL CLAVICLE EXCISION,BICEP TENODESIS, MINI OPEN REGENTEN PATCH APPLICATION;  Surgeon: Lorri Rota, MD;  Location: [facility] ORS;  Service: Orthopedics;  Laterality: Left;   Patient Active Problem List   Diagnosis Date Noted   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   Closed fracture of rib of left side with routine healing 09/04/2022   Chronic pain of left heel 08/31/2021   Arthropathy of left wrist 10/12/2020   Viral hepatitis C 11/02/2019   Lumbar facet arthropathy 04/27/2019   Lumbar degenerative disc disease 04/27/2019   Localized osteoarthritis of shoulder regions, bilateral 04/27/2019   Encounter for long-term opiate analgesic use 04/27/2019   Chronic SI joint pain 04/27/2019   Chronic pain syndrome 04/27/2019   Pain in joint of left shoulder 06/03/2018   Abnormal gait  06/18/2017   Anatomical narrow angle 06/18/2017   Derangement of posterior horn of medial meniscus 06/18/2017   Edema 06/18/2017   Hip pain 06/18/2017   History of total knee arthroplasty 06/18/2017   Localized, primary osteoarthritis 06/18/2017   Muscle weakness 06/18/2017   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   Acute bursitis of right shoulder 06/14/2017   Nonallopathic lesion of sacral region 06/14/2017   Nonallopathic lesion of thoracic region 06/14/2017   Nonallopathic lesion of lumbosacral region 06/14/2017   Biomechanical lesion 06/14/2017   Chronic bilateral low back pain with bilateral sciatica 05/23/2017   Arthritis 04/19/2013   Cataract nuclear 06/16/2012   Glaucoma suspect of both eyes 06/16/2012    PCP: Rex Castor, MD  REFERRING PROVIDER: Cephus Collin, MD  REFERRING DIAG: myofascial pain syndrome of thoracic spine  Rationale for Evaluation and Treatment: Rehabilitation  THERAPY DIAG:  Pain in thoracic spine  Muscle weakness (generalized)  ONSET DATE: October 2024 (approximately 3 months after rib fracture in July 2024)  SUBJECTIVE:  PERTINENT HISTORY:  Patient is a 68 y.o. male who presents to outpatient physical therapy with a referral for medical diagnosis of myofascial pain syndrome of thoracic spine. This patient's chief complaints consist of chronic right sided thoracic spine pain, right shoulder pain, and occasional paresthesia to the right UE, leading to the following functional deficits: limits him with everyday activities/tasks to some amount, working on projects, working on the computer, walking for exercise, sleeping, doing as much as he wants to. He never did get into a routine with the workout program.   Relevant past medical history and comorbidities  include bilateral shoulder surgery (RTC repair, R most recent 12/2020) and subarachnoid hemorrhage in 2017. Patient denies hx of cancer, seizures, unexplained weight loss, unexplained changes in bowel or bladder problems, unexplained stumbling or dropping things, back surgery.    SUBJECTIVE STATEMENT: Patient states he felt pretty good Friday, but Saturday it spiraled and could not keep it from hurting. Same on Sunday. Today he felt reasonably good, so he started working. It started hurting so he tried several things and did not have any success until a while ago when it just stopped hurting. It is currently below it's baseline, but he expects it to start hurting any minute now. He is describing the neck. R shoulder was hurting Saturday night but not last night. He has been able to work with it. Reaching into abduction with some weight was difficult but it did not start hurting again. His right shoulder is in pretty good shape. He feels that PT helped it. R scapular pain does not have a lot of improvement but it is better. It is intermittent and comes and goes with the neck a little bit, but the neck will get really bad and the scapular pain will be there but not higher unless he does something that aggravates it. He states that his neck felt hot after traction last visit like when you blush but not to touch. He did not have dizziness, changed vision, and felt okay otherwise. He did not have any further spasms in the back following traction. He is trying to keep track of his blood pressure and he thinks his home unit is inaccurate. He requests we take blood pressure today at the clinic.   Per chart review 06/04/2023: Dr. Rhesa Celeste recommended continuing PT for neck and right shoulder at visit with him on 05/23/2023.   PAIN: Are you having pain? Yes;  NPRS: 4/10 neck, 3/10 R shoulder, 4/10 R periscapular region, 3/10 left low back (normal level there)      FUNCTIONAL LIMITATIONS: limits him with everyday  activities and tasks, working on projects, working on the computer, walking for exercise, sleeping, doing as much as he wants to  [employer]: working in the shop, building and fixing things  PRECAUTIONS: None   WEIGHT BEARING RESTRICTIONS: No   PLOF: Independent  PATIENT GOALS: to get rid of the pain, to get on an exercise and stretching regimen for his overall health, sleep with less difficulty from pain   OBJECTIVE   Vitals:   06/24/23 1357  BP: 134/82     1RM TESTING Bent Over Row with contralateral UE braced (last tested 03/27/2023): 1 RM for Right Arm: 75.8  1 RM for Left Arm: 80.2   Bent Over Shoulder Scaption ("Y"):  (last tested 03/27/2023): 1 RM for Right Arm: 8.9  1 RM for Left Arm: 9.1   TREATMENT  Therapeutic exercise: therapeutic exercises that incorporate ONE parameter at one or more areas of the body to centralize symptoms, develop strength and endurance, range of motion, and flexibility required for successful completion of functional activities.  Vitals Testing per pt request (see above)  Shoulder AROM/PROM/OP/strength baseline testing Min/Mod loss flexion, abduction, IR.  Min loss extension Shoulder abduction able to perform with 3#D but not 5#DB  Repeated R shoulder extension with palm up using plinth for support 2x10 Mild improvement in ease of active abduction Added to HEP  Repeated baseline tests to assess repsonse  Seated cervical spine rotational SNAG with pillow case edge Completed up to 10 reps each side at each level  Manual therapy: to reduce pain and tissue tension, improve range of motion, neuromodulation, in order to promote improved ability to complete functional activities.  PRONE CPA and bilateral UPA grade III-IV along all segments of cervical spine and upper thoracic spine. Multiple bouts of up to 30 seconds per  segment to improve motion and pain.   Mechanical Traction: to improve cervical spine and thoracic pain, and help decrease muscle tension.  Type: Cervical Min (lb): 0 Max (lb): 35  Hold time: 30 seconds Rest time: 10 seconds Pull angle: ~20 degrees Time: 15 plus set up and take down Result: feels good after and good tolerance during.   Pt required multimodal cuing for proper technique and to facilitate improved neuromuscular control, strength, range of motion, and functional ability resulting in improved performance and form.     PATIENT EDUCATION:  Education details: Education on diagnosis, prognosis, POC, anatomy and physiology of current condition. Mechanical traction. Home traction.  Person educated: Patient Education method: Explanation, Demonstration Education comprehension: verbalized understanding and needs further education  HOME EXERCISE PROGRAM: Access Code: [email or web address] URL: [email or web address] Date: 05/17/2023 Prepared by: Alleen Isle  Exercises - Dumbbell Squat at Shoulders  - 1 x daily - 2-3 x weekly - 2 sets - 20 reps - Seated Thoracic Lumbar Extension  - 1 x daily - 1 sets - 20 reps - 5 seconds hold - Standing Row with Anchored Resistance  - 3 x weekly - 2 sets - 20 reps - Resistance Pulldown with March  - 3 x weekly - 2 sets - 20 reps - Curator  - 3 x weekly - 2 sets - 1 min each arm carry time - Sitting Chest Press With Kettlebell  - 3 x weekly - 2 sets - 15 reps - Quadruped Cat Cow  - 1 x daily - 2 sets - 10 reps - Child's Pose with Sidebending  - 1 x daily - 2 sets - 30 hold - Quadruped Rock Back into [employer] Up  - 1 x daily - 1 sets - 10 reps - Prone Press Up  - 1 x daily - 2 sets - 10 reps - Standing Bent Over Single Arm Scapular Row with Table Support with PLB  - 3 x weekly - 2 sets - 15-20 reps - Single Arm Bent Over Shoulder Scaption with Dumbbell  - 3 x weekly - 2 sets - 15-20 reps - Sub-Occipital Cervical Stretch   - 1 x daily - 1 sets - 10 reps - 5 second hold - Thoracic Stretch on Foam Roll - Hands Clasped  - 1-2 sets - 10 reps - Snow Angels on Foam Roll  - 1 x daily - 2 sets - 10 reps - Prone Angels  - Seated Upper Trapezius Stretch  - 1-2 x daily - 2-3  reps - 60 seconds hold  HOME EXERCISE PROGRAM [[email or web address]] View at "[email or web address]" using code: [email or web address] Prayer Stretch -  Repeat 10 Repetitions, Hold 2 Seconds, Complete 2 Sets, Perform 3 Times a Day  HOME EXERCISE PROGRAM [[id number]] View at [email or web address] using code: [id number] Suboccipital Stretch and C1-C2 Mobilization -  Repeat 20 Repetitions, Hold 2 Seconds, Perform 3 Times a Day  HOME EXERCISE PROGRAM [[email or web address]] View at [email or web address] using code [email or web address] LAT PULLDOWN - SINGLE ARM  -  Repeat 15 Repetitions, Complete 3 Sets, Perform 1 Times a Day  HOME EXERCISE PROGRAM Created by Alleen Isle, PT, DPT May 5th, 2025 View at [email or web address] code [email or web address] Repeated Shoulder Extension MDT: Perform 10-12 reps every 2-3 hours Cervical Rotation SNAG: Repeat 10 Times Hold 3 Seconds each level 1-2x a day To be more specific with this, line up the edge of the linen at the level of your neck where the stiffness exists. or move along the spine about 1/2 inch lower each time (4-6 levels)  6 Day Strength-Mobility Exercise Program  Determining how much weight to use with exercise:  If the goal of the exercise is strength and hypertrophy (muscle growth), choose a heavier weight that is very challenging by the end of 8-12 reps.   If the goal of the exercise is muscle endurance, choose a lighter weight that feels very challenging after 10-15 reps (up to 20 reps).  If you can do over 20-30 reps with a weight, you likely aren't challenging the muscles enough to make strength/endurance gains.   Repetition recommendations apply to reps that you can do with proper form. For example, if your form fails after 2-3 reps, only do 2-3 reps  until you can do more with good form or reduce the weight you are using if possible.    3 Non-Consecutive Days Per Week  Circuits Exercise Sets Reps Rest Interval   Warm Up (can go through twice if needed)   Aerobic Warm Up: Walking (optional)   Modified Prayer Stretch 20 5 second hold    Squats (no weight) 1 10    Supine Snow angels on Foam Roll (head supported)  1 20    Main Workout   A1 Squats with dumbbells on shoulders (8# DBs) 2-3 15   A2 Bent over single arm row  (>=30#) 2-3 8-10   1-2-minute rest following completion of circuit A if desired  B1 Deadlifts with barbell (40# added to bar) 2-3 15   B2 Push ups (modified to surface) 2-3 15   Any stretches you prefer would be great following circuit B   ASSESSMENT:  CLINICAL IMPRESSION: Patient again with decreased neck pain in response to manual interventions and traction. Also tolerated SNAG well today and was able to feel segmental stretch while performing it. Repeated motion exercise for R shoulder prescribed to test potential for mechanical shoulder pain that responds to repeated extension. Patient would benefit from continued management of limiting condition by skilled physical therapist to address remaining impairments and functional limitations to work towards stated goals and return to PLOF or maximal functional independence.   OBJECTIVE IMPAIRMENTS: decreased activity tolerance, decreased endurance, decreased knowledge of condition, decreased mobility, difficulty walking, decreased ROM, decreased strength, hypomobility, impaired perceived functional ability, increased muscle spasms, impaired flexibility, impaired UE functional use, postural dysfunction, and pain.   ACTIVITY LIMITATIONS: carrying, lifting, bending, sitting, standing, squatting, sleeping, transfers, bed mobility, bathing, dressing, hygiene/grooming, locomotion level, and caring for others  PARTICIPATION LIMITATIONS: meal prep, cleaning, laundry,  interpersonal relationship,  driving, shopping, community activity, yard work, and everyday activities and tasks, working on projects, working on [employer], walking for exercise, sleeping, doing as much as he wants to   PERSONAL FACTORS: Age, Past/current experiences, Time since onset of injury/illness/exacerbation, and 3+ comorbidities: bilateral shoulder surgery (RTC repair, R most recent 12/2020), bilateral TKA, subarachnoid hemorrhage 2017 with SAH coiling, HTN, hx of hepatitis C (cured), double hernia repair, plantar facial fibromatosis, chronic pain syndrome, former smoker, Chronic bilateral low back pain with bilateral sciatica; Acute bursitis of right shoulder; Lumbar facet arthropathy; Lumbar degenerative disc disease; Localized osteoarthritis of shoulder regions, bilateral; Chronic SI joint pain; Chronic pain syndrome; Pain in joint of left shoulder; Abnormal gait; Anatomical narrow angle; Biomechanical lesion; Cataract nuclear; Derangement of posterior horn of medial meniscus; Edema; Glaucoma suspect of both eyes; Hip pain; History of total knee arthroplasty; Localized, primary osteoarthritis; Hx of subarachnoid hemophage, Muscle weakness; Arthritis; Plantar fascial fibromatosis; Viral hepatitis C; Arthropathy of left wrist; Chronic pain of left heel; Intercostal neuralgia; and Closed fracture of rib of left side with routine healing,  has a past medical history of Arthritis, Hepatitis, Hypertension, Subarachnoid hemorrhage (HCC) (2017), and Wears hearing aid in both ears.  has a past surgical history that includes Brain surgery; Joint replacement (Bilateral); Shoulder arthroscopy with rotator cuff repair and open biceps tenodesis (Left, 09/08/2018); and Hernia repair (1963). Also had surgery on right shoulder are also affecting patient's functional outcome.   REHAB POTENTIAL: Good  CLINICAL DECISION MAKING: Evolving/moderate complexity  EVALUATION COMPLEXITY: Moderate   GOALS: Goals  reviewed with patient? No  SHORT TERM GOALS: Target date: 03/20/2023  Patient will be independent with initial home exercise program for self-management of symptoms. Baseline: Initial HEP to be provided at visit 2 as appropriate (03/06/23); continued participation in HEP (04/18/23); Goal status: MET   LONG TERM GOALS: Target date: 05/29/2023. Target date updated to 08/28/2023 for all unmet goals on 06/05/2023.  Patient will be independent with a long-term home exercise program for self-management of symptoms.  Baseline: Initial HEP to be provided at visit 2 as appropriate (03/06/23); continued participation in HEP (04/18/23); not participating recently (06/05/2023);  Goal status: In progress  2.  Patient will demonstrate improved FOTO to equal or greater than 56 by visit #13 to demonstrate improvement in overall condition and self-reported functional ability.  Baseline: 48/100 (03/06/23); 47/100 (04/18/23); Goal status: Discontinued due to clinic discontinuing access to measure (06/05/2023)  3.  Patient will demonstrate full thoracic AROM without provocation of thoracic spine pain and tightness to improve his ability to complete tasks such as walking, sweeping, and washing dishes with less difficulty. Baseline: Limitations with extension/flexion and concordant pulling sensation with lateral flexion (03/06/23); 46 degrees of flexion, 15 degrees of extension, continued limitations in ROM with later flexion but not painful with motion (04/18/23); continues with limitations and pain - see objective (06/05/2023);  Goal status: Ongoing  4.  Patient will demonstrate full cervical AROM without provocation of UT pain and thoracic spine tightness to improve ability to complete ADLs with less difficulty.  Baseline: Limited flexion and left side bending causing UT pain (03/06/23); 44 degrees and no pain with cervical flexion, 11 degrees of left side bending with tightness but no pain (04/18/23); continues with  limitations and pain in neck (06/05/2023);  Goal status: In progress  5.  Patient will demonstrate improvement in Patient Specific Functional Scale (PSFS) of equal or greater than 3 points to reflect clinically significant improvement in patient's most valued functional activities.  Baseline: To be tested at visit 2 as appropriate (03/06/23); 5/10 at visit #2 (03/13/2023); 7/10 at visit #10 (04/18/23); 6.3/10 (06/05/2023);  Goal status: In progress   PLAN:  PT FREQUENCY: 1-2x/week  PT DURATION: 8-12 weeks  PLANNED INTERVENTIONS: 97164- PT Re-evaluation, 97110-Therapeutic exercises, 97530- Therapeutic activity, 97112- Neuromuscular re-education, 97535- Self Care, 16109- Manual therapy, 97014- Electrical stimulation (unattended), Patient/Family education, Dry Needling, Joint mobilization, Spinal mobilization, Cryotherapy, and Moist heat.  PLAN FOR NEXT SESSION: update HEP as appropriate, progressive postural/UE/functional strengthening, core strengthening, manual therapy as needed, education.   Carilyn Charles. Artemio Larry, PT, DPT 06/24/23, 6:37 PM  [facility] [address] [place], [place] [address] P: [phone] I F: [phone]

## 2023-06-25 ENCOUNTER — Other Ambulatory Visit: Payer: Self-pay | Admitting: Student in an Organized Health Care Education/Training Program

## 2023-06-25 DIAGNOSIS — G894 Chronic pain syndrome: Secondary | ICD-10-CM

## 2023-06-26 ENCOUNTER — Ambulatory Visit: Admitting: Physical Therapy

## 2023-06-26 ENCOUNTER — Encounter: Payer: Self-pay | Admitting: Physical Therapy

## 2023-06-26 DIAGNOSIS — M546 Pain in thoracic spine: Secondary | ICD-10-CM | POA: Diagnosis not present

## 2023-06-26 DIAGNOSIS — M6281 Muscle weakness (generalized): Secondary | ICD-10-CM

## 2023-06-26 NOTE — Therapy (Signed)
 OUTPATIENT PHYSICAL THERAPY TREATMENT  Patient Name: Charles Mooney MRN: [id number] DOB:[birth year], 68 y.o., male Today's Date: 06/26/2023  END OF SESSION:  PT End of Session - 06/26/23 1538     Visit Number 24    Number of Visits 35    Date for PT Re-Evaluation 08/28/23    Authorization Type MEDICARE PART B reporting period from 06/12/2023    Progress Note Due on Visit 20    PT Start Time 1440    PT Stop Time 1533    PT Time Calculation (min) 53 min    Activity Tolerance Patient tolerated treatment well    Behavior During Therapy [facility] for tasks assessed/performed                   Past Medical History:  Diagnosis Date   Arthritis    Hepatitis    c 2000   Hypertension    Subarachnoid hemorrhage (HCC) 2017   Wears hearing aid in both ears    Past Surgical History:  Procedure Laterality Date   BRAIN SURGERY     [place] COILING 2017   HERNIA REPAIR  1963   double hernia repair   JOINT REPLACEMENT Bilateral    TKR  L-2012, R-2015   SHOULDER ARTHROSCOPY WITH ROTATOR CUFF REPAIR AND OPEN BICEPS TENODESIS Left 09/08/2018   Procedure: LEFT SHOULDER ARTHROSCOPY,SUBSACAPULARIS REPAIR, SUBACROMIAL DECOMP, DISTAL CLAVICLE EXCISION,BICEP TENODESIS, MINI OPEN REGENTEN PATCH APPLICATION;  Surgeon: Lorri Rota, MD;  Location: [facility] ORS;  Service: Orthopedics;  Laterality: Left;   Patient Active Problem List   Diagnosis Date Noted   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   Closed fracture of rib of left side with routine healing 09/04/2022   Chronic pain of left heel 08/31/2021   Arthropathy of left wrist 10/12/2020   Viral hepatitis C 11/02/2019   Lumbar facet arthropathy 04/27/2019   Lumbar degenerative disc disease 04/27/2019   Localized osteoarthritis of shoulder regions, bilateral 04/27/2019   Encounter for long-term opiate analgesic use 04/27/2019   Chronic SI joint pain 04/27/2019   Chronic pain syndrome 04/27/2019   Pain in joint of left shoulder 06/03/2018   Abnormal gait  06/18/2017   Anatomical narrow angle 06/18/2017   Derangement of posterior horn of medial meniscus 06/18/2017   Edema 06/18/2017   Hip pain 06/18/2017   History of total knee arthroplasty 06/18/2017   Localized, primary osteoarthritis 06/18/2017   Muscle weakness 06/18/2017   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   Acute bursitis of right shoulder 06/14/2017   Nonallopathic lesion of sacral region 06/14/2017   Nonallopathic lesion of thoracic region 06/14/2017   Nonallopathic lesion of lumbosacral region 06/14/2017   Biomechanical lesion 06/14/2017   Chronic bilateral low back pain with bilateral sciatica 05/23/2017   Arthritis 04/19/2013   Cataract nuclear 06/16/2012   Glaucoma suspect of both eyes 06/16/2012    PCP: Rex Castor, MD  REFERRING PROVIDER: Cephus Collin, MD  REFERRING DIAG: myofascial pain syndrome of thoracic spine  Rationale for Evaluation and Treatment: Rehabilitation  THERAPY DIAG:  Pain in thoracic spine  Muscle weakness (generalized)  ONSET DATE: October 2024 (approximately 3 months after rib fracture in July 2024)  SUBJECTIVE:  PERTINENT HISTORY:  Patient is a 68 y.o. male who presents to outpatient physical therapy with a referral for medical diagnosis of myofascial pain syndrome of thoracic spine. This patient's chief complaints consist of chronic right sided thoracic spine pain, right shoulder pain, and occasional paresthesia to the right UE, leading to the following functional deficits: limits him with everyday activities/tasks to some amount, working on projects, working on the computer, walking for exercise, sleeping, doing as much as he wants to. He never did get into a routine with the workout program.   Relevant past medical history and comorbidities  include bilateral shoulder surgery (RTC repair, R most recent 12/2020) and subarachnoid hemorrhage in 2017. Patient denies hx of cancer, seizures, unexplained weight loss, unexplained changes in bowel or bladder problems, unexplained stumbling or dropping things, back surgery.    SUBJECTIVE STATEMENT: Patient states he did pretty well after last visit. Yesterday he felt well. He worked some this morning and just after lunch his neck started hurting him while he was working on the computer. It has gone down hill since then. He has raised up the computer so he is looking more straight ahead. While he was sitting in the waiting room he was doing a right sidebending stretch at the neck and his right trunk muscles cramped up and have not let go yet. He is schedule for a massage tonight. He gets most of his pain when he is holding still, not when moving. He did his shoulder exercise 3 times a day. He noticed his shoulder got better. There was less pain last night. He did work yesterday. Patient reiterates his neck pain is not so problematic when moving, it is after being still for a few seconds where it starts to ache.   Per chart review 06/04/2023: Dr. Rhesa Celeste recommended continuing PT for neck and right shoulder at visit with him on 05/23/2023.   PAIN: Are you having pain? Yes;  NPRS: 6/10 neck, 3/10 R shoulder, 4/10 R periscapular region, 3/10 left low back (normal level there)      FUNCTIONAL LIMITATIONS: limits him with everyday activities and tasks, working on projects, working on the computer, walking for exercise, sleeping, doing as much as he wants to  [employer]: working in the shop, building and fixing things  PRECAUTIONS: None   WEIGHT BEARING RESTRICTIONS: No   PLOF: Independent  PATIENT GOALS: to get rid of the pain, to get on an exercise and stretching regimen for his overall health, sleep with less difficulty from pain   Next MD appointment: June or July with Dr. Rhesa Celeste  OBJECTIVE     1RM TESTING Bent Over Row with contralateral UE braced (last tested 03/27/2023): 1 RM for Right Arm: 75.8  1 RM for Left Arm: 80.2   Bent Over Shoulder Scaption ("Y"):  (last tested 03/27/2023): 1 RM for Right Arm: 8.9  1 RM for Left Arm: 9.1   TREATMENT  Therapeutic exercise: therapeutic exercises that incorporate ONE parameter at one or more areas of the body to centralize symptoms, develop strength and endurance, range of motion, and flexibility required for successful completion of functional activities.  Seated cervical spine rotational SNAG with pillow case edge / strap Completed up to 10 reps each side at 2 levels  Cuing to hold only 1-2 seconds  Shoulder AROM/PROM/OP/strength baseline testing Min loss abduction (pain)  Mid/Mod loss IR (pain) nil loss extension (pain), flexion Shoulder abduction able to perform with 3#D but not 5#DB, no pain  Seated cervical retractions AROM to with self overpressure 1x10 but some assistance from PT at times to get increased force  Limited ability to get enough force  Prone on elbows  Cervical retraction with self overpressure Attempted first with hands but requested a block when it was awkward and uncomfortable 2x10 with chin on yoga block and arms out to the side modulating force Uncomfortable on jaw Feels possibly better after first set 2nd set completed after set of retraction to extension with self overpressure (see below) Cervical retraction to extension with self overpressure 1x10  Felt increased stretch at base of neck but seemed to increase neck pain slightly afterwards so returned to retraction without extension. Patient impressed with how stiff he is here.   Provided patient with HEP handout for prone on elbows retraction with overpressure with and without extension to trial at home. Extension is easier  but retraction is more comfortable.     Mechanical Traction: to improve cervical spine and thoracic pain, and help decrease muscle tension.  Type: Cervical Min (lb): 0 Max (lb): 30-35  Hold time: 30 seconds Rest time: 10 seconds Pull angle: ~20 degrees Time: 15 plus set up and take down Result: feels good after and good tolerance during.   Pt required multimodal cuing for proper technique and to facilitate improved neuromuscular control, strength, range of motion, and functional ability resulting in improved performance and form.     PATIENT EDUCATION:  Education details: Education on diagnosis, prognosis, POC, anatomy and physiology of current condition. Mechanical traction. Home traction.  Person educated: Patient Education method: Explanation, Demonstration Education comprehension: verbalized understanding and needs further education  HOME EXERCISE PROGRAM: Access Code: [email or web address] URL: [email or web address] Date: 05/17/2023 Prepared by: Alleen Isle  Exercises - Dumbbell Squat at Shoulders  - 1 x daily - 2-3 x weekly - 2 sets - 20 reps - Seated Thoracic Lumbar Extension  - 1 x daily - 1 sets - 20 reps - 5 seconds hold - Standing Row with Anchored Resistance  - 3 x weekly - 2 sets - 20 reps - Resistance Pulldown with March  - 3 x weekly - 2 sets - 20 reps - Curator  - 3 x weekly - 2 sets - 1 min each arm carry time - Sitting Chest Press With Kettlebell  - 3 x weekly - 2 sets - 15 reps - Quadruped Cat Cow  - 1 x daily - 2 sets - 10 reps - Child's Pose with Sidebending  - 1 x daily - 2 sets - 30 hold - Quadruped Rock Back into [employer] Up  - 1 x daily - 1 sets - 10 reps - Prone Press Up  - 1 x daily - 2 sets - 10 reps - Standing Bent Over Single Arm Scapular Row with Table Support with PLB  - 3 x weekly - 2 sets - 15-20 reps - Single Arm Bent Over Shoulder Scaption with  Dumbbell  - 3 x weekly - 2 sets - 15-20 reps - Sub-Occipital Cervical  Stretch  - 1 x daily - 1 sets - 10 reps - 5 second hold - Thoracic Stretch on Foam Roll - Hands Clasped  - 1-2 sets - 10 reps - Snow Angels on Foam Roll  - 1 x daily - 2 sets - 10 reps - Prone Angels  - Seated Upper Trapezius Stretch  - 1-2 x daily - 2-3 reps - 60 seconds hold  HOME EXERCISE PROGRAM [[id number]] View at "[email or web address]" using code: [email or web address] Prayer Stretch -  Repeat 10 Repetitions, Hold 2 Seconds, Complete 2 Sets, Perform 3 Times a Day  HOME EXERCISE PROGRAM [[id number]] View at [email or web address] using code: [id number] Suboccipital Stretch and C1-C2 Mobilization -  Repeat 20 Repetitions, Hold 2 Seconds, Perform 3 Times a Day  HOME EXERCISE PROGRAM [[email or web address]] View at [email or web address] using code [email or web address] LAT PULLDOWN - SINGLE ARM  -  Repeat 15 Repetitions, Complete 3 Sets, Perform 1 Times a Day  HOME EXERCISE PROGRAM Created by Alleen Isle, PT, DPT May 5th, 2025 View at [email or web address] code [email or web address] Repeated Shoulder Extension MDT: Perform 10-12 reps every 2-3 hours Cervical Rotation SNAG: Repeat 10 Times Hold 3 Seconds each level 1-2x a day To be more specific with this, line up the edge of the linen at the level of your neck where the stiffness exists. or move along the spine about 1/2 inch lower each time (4-6 levels)  HOME EXERCISE PROGRAM [[id number]] View at [email or web address] code [id number] Retraction in Prone with Overpressure- McKenzie -  Repeat 10 Repetitions, Hold 1 Second(s), Perform 8 Times a Day OR Retraction Extension in Prone- McKenzie -  Repeat 10 Repetitions, Hold 1 Second(s), Perform 8 Times a Day  6 Day Strength-Mobility Exercise Program  Determining how much weight to use with exercise:  If the goal of the exercise is strength and hypertrophy (muscle growth), choose a heavier weight that is very challenging by the end of 8-12 reps.   If the goal of the exercise is muscle endurance, choose a lighter weight that feels very challenging  after 10-15 reps (up to 20 reps).  If you can do over 20-30 reps with a weight, you likely aren't challenging the muscles enough to make strength/endurance gains.   Repetition recommendations apply to reps that you can do with proper form. For example, if your form fails after 2-3 reps, only do 2-3 reps until you can do more with good form or reduce the weight you are using if possible.    3 Non-Consecutive Days Per Week  Circuits Exercise Sets Reps Rest Interval   Warm Up (can go through twice if needed)   Aerobic Warm Up: Walking (optional)   Modified Prayer Stretch 20 5 second hold    Squats (no weight) 1 10    Supine Snow angels on Foam Roll (head supported)  1 20    Main Workout   A1 Squats with dumbbells on shoulders (8# DBs) 2-3 15   A2 Bent over single arm row  (>=30#) 2-3 8-10   1-2-minute rest following completion of circuit A if desired  B1 Deadlifts with barbell (40# added to bar) 2-3 15   B2 Push ups (modified to surface) 2-3 15   Any stretches you prefer would be great following circuit B   ASSESSMENT:  CLINICAL IMPRESSION: Patient continues with neck stiffness and neck pain, most bothersome after a few minutes of rest. Attempted various self stretches to help improve  carry over and self-efficacy in improving pain, but patient reported not change by end of session. Mechanical traction was unable to provide relief today without being accompanied by manual therapy. Patient would benefit from continued management of limiting condition by skilled physical therapist to address remaining impairments and functional limitations to work towards stated goals and return to PLOF or maximal functional independence.   OBJECTIVE IMPAIRMENTS: decreased activity tolerance, decreased endurance, decreased knowledge of condition, decreased mobility, difficulty walking, decreased ROM, decreased strength, hypomobility, impaired perceived functional ability, increased muscle spasms, impaired  flexibility, impaired UE functional use, postural dysfunction, and pain.   ACTIVITY LIMITATIONS: carrying, lifting, bending, sitting, standing, squatting, sleeping, transfers, bed mobility, bathing, dressing, hygiene/grooming, locomotion level, and caring for others  PARTICIPATION LIMITATIONS: meal prep, cleaning, laundry, interpersonal relationship, driving, shopping, community activity, yard work, and everyday activities and tasks, working on projects, working on [employer], walking for exercise, sleeping, doing as much as he wants to   PERSONAL FACTORS: Age, Past/current experiences, Time since onset of injury/illness/exacerbation, and 3+ comorbidities: bilateral shoulder surgery (RTC repair, R most recent 12/2020), bilateral TKA, subarachnoid hemorrhage 2017 with SAH coiling, HTN, hx of hepatitis C (cured), double hernia repair, plantar facial fibromatosis, chronic pain syndrome, former smoker, Chronic bilateral low back pain with bilateral sciatica; Acute bursitis of right shoulder; Lumbar facet arthropathy; Lumbar degenerative disc disease; Localized osteoarthritis of shoulder regions, bilateral; Chronic SI joint pain; Chronic pain syndrome; Pain in joint of left shoulder; Abnormal gait; Anatomical narrow angle; Biomechanical lesion; Cataract nuclear; Derangement of posterior horn of medial meniscus; Edema; Glaucoma suspect of both eyes; Hip pain; History of total knee arthroplasty; Localized, primary osteoarthritis; Hx of subarachnoid hemophage, Muscle weakness; Arthritis; Plantar fascial fibromatosis; Viral hepatitis C; Arthropathy of left wrist; Chronic pain of left heel; Intercostal neuralgia; and Closed fracture of rib of left side with routine healing,  has a past medical history of Arthritis, Hepatitis, Hypertension, Subarachnoid hemorrhage (HCC) (2017), and Wears hearing aid in both ears.  has a past surgical history that includes Brain surgery; Joint replacement (Bilateral); Shoulder  arthroscopy with rotator cuff repair and open biceps tenodesis (Left, 09/08/2018); and Hernia repair (1963). Also had surgery on right shoulder are also affecting patient's functional outcome.   REHAB POTENTIAL: Good  CLINICAL DECISION MAKING: Evolving/moderate complexity  EVALUATION COMPLEXITY: Moderate   GOALS: Goals reviewed with patient? No  SHORT TERM GOALS: Target date: 03/20/2023  Patient will be independent with initial home exercise program for self-management of symptoms. Baseline: Initial HEP to be provided at visit 2 as appropriate (03/06/23); continued participation in HEP (04/18/23); Goal status: MET   LONG TERM GOALS: Target date: 05/29/2023. Target date updated to 08/28/2023 for all unmet goals on 06/05/2023.  Patient will be independent with a long-term home exercise program for self-management of symptoms.  Baseline: Initial HEP to be provided at visit 2 as appropriate (03/06/23); continued participation in HEP (04/18/23); not participating recently (06/05/2023);  Goal status: In progress  2.  Patient will demonstrate improved FOTO to equal or greater than 56 by visit #13 to demonstrate improvement in overall condition and self-reported functional ability.  Baseline: 48/100 (03/06/23); 47/100 (04/18/23); Goal status: Discontinued due to clinic discontinuing access to measure (06/05/2023)  3.  Patient will demonstrate full thoracic AROM without provocation of thoracic spine pain and tightness to improve his ability to complete tasks such as walking, sweeping, and washing dishes with less difficulty. Baseline: Limitations with extension/flexion and concordant pulling sensation with lateral flexion (03/06/23); 46 degrees of  flexion, 15 degrees of extension, continued limitations in ROM with later flexion but not painful with motion (04/18/23); continues with limitations and pain - see objective (06/05/2023);  Goal status: Ongoing  4.  Patient will demonstrate full cervical AROM  without provocation of UT pain and thoracic spine tightness to improve ability to complete ADLs with less difficulty.  Baseline: Limited flexion and left side bending causing UT pain (03/06/23); 44 degrees and no pain with cervical flexion, 11 degrees of left side bending with tightness but no pain (04/18/23); continues with limitations and pain in neck (06/05/2023);  Goal status: In progress  5.  Patient will demonstrate improvement in Patient Specific Functional Scale (PSFS) of equal or greater than 3 points to reflect clinically significant improvement in patient's most valued functional activities. Baseline: To be tested at visit 2 as appropriate (03/06/23); 5/10 at visit #2 (03/13/2023); 7/10 at visit #10 (04/18/23); 6.3/10 (06/05/2023);  Goal status: In progress   PLAN:  PT FREQUENCY: 1-2x/week  PT DURATION: 8-12 weeks  PLANNED INTERVENTIONS: 97164- PT Re-evaluation, 97110-Therapeutic exercises, 97530- Therapeutic activity, 97112- Neuromuscular re-education, 97535- Self Care, 16109- Manual therapy, 97014- Electrical stimulation (unattended), Patient/Family education, Dry Needling, Joint mobilization, Spinal mobilization, Cryotherapy, and Moist heat.  PLAN FOR NEXT SESSION: update HEP as appropriate, progressive postural/UE/functional strengthening, core strengthening, manual therapy as needed, education.   Carilyn Charles. Artemio Larry, PT, DPT 06/26/23, 8:14 PM  [facility] [address] [place], [place] [address] P: [phone] I F: [phone]

## 2023-07-02 ENCOUNTER — Ambulatory Visit

## 2023-07-02 DIAGNOSIS — M546 Pain in thoracic spine: Secondary | ICD-10-CM | POA: Diagnosis not present

## 2023-07-02 DIAGNOSIS — M6281 Muscle weakness (generalized): Secondary | ICD-10-CM

## 2023-07-02 DIAGNOSIS — M5459 Other low back pain: Secondary | ICD-10-CM

## 2023-07-02 NOTE — Therapy (Unsigned)
 OUTPATIENT PHYSICAL THERAPY TREATMENT  Patient Name: Charles Mooney MRN: [id number] DOB:[birth year], 68 y.o., male Today's Date: 07/03/2023  END OF SESSION:  PT End of Session - 07/03/23 0849     Visit Number 25    Number of Visits 35    Date for PT Re-Evaluation 08/28/23    Authorization Type MEDICARE PART B reporting period from 06/12/2023    Progress Note Due on Visit 20    PT Start Time 1734    PT Stop Time 1815    PT Time Calculation (min) 41 min    Activity Tolerance Patient tolerated treatment well    Behavior During Therapy [facility] for tasks assessed/performed                    Past Medical History:  Diagnosis Date   Arthritis    Hepatitis    c 2000   Hypertension    Subarachnoid hemorrhage (HCC) 2017   Wears hearing aid in both ears    Past Surgical History:  Procedure Laterality Date   BRAIN SURGERY     [facility] COILING 2017   HERNIA REPAIR  1963   double hernia repair   JOINT REPLACEMENT Bilateral    TKR  L-2012, R-2015   SHOULDER ARTHROSCOPY WITH ROTATOR CUFF REPAIR AND OPEN BICEPS TENODESIS Left 09/08/2018   Procedure: LEFT SHOULDER ARTHROSCOPY,SUBSACAPULARIS REPAIR, SUBACROMIAL DECOMP, DISTAL CLAVICLE EXCISION,BICEP TENODESIS, MINI OPEN REGENTEN PATCH APPLICATION;  Surgeon: Lorri Rota, MD;  Location: [facility] ORS;  Service: Orthopedics;  Laterality: Left;   Patient Active Problem List   Diagnosis Date Noted   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   Closed fracture of rib of left side with routine healing 09/04/2022   Chronic pain of left heel 08/31/2021   Arthropathy of left wrist 10/12/2020   Viral hepatitis C 11/02/2019   Lumbar facet arthropathy 04/27/2019   Lumbar degenerative disc disease 04/27/2019   Localized osteoarthritis of shoulder regions, bilateral 04/27/2019   Encounter for long-term opiate analgesic use 04/27/2019   Chronic SI joint pain 04/27/2019   Chronic pain syndrome 04/27/2019   Pain in joint of left shoulder 06/03/2018   Abnormal  gait 06/18/2017   Anatomical narrow angle 06/18/2017   Derangement of posterior horn of medial meniscus 06/18/2017   Edema 06/18/2017   Hip pain 06/18/2017   History of total knee arthroplasty 06/18/2017   Localized, primary osteoarthritis 06/18/2017   Muscle weakness 06/18/2017   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   Acute bursitis of right shoulder 06/14/2017   Nonallopathic lesion of sacral region 06/14/2017   Nonallopathic lesion of thoracic region 06/14/2017   Nonallopathic lesion of lumbosacral region 06/14/2017   Biomechanical lesion 06/14/2017   Chronic bilateral low back pain with bilateral sciatica 05/23/2017   Arthritis 04/19/2013   Cataract nuclear 06/16/2012   Glaucoma suspect of both eyes 06/16/2012    PCP: Rex Castor, MD  REFERRING PROVIDER: Cephus Collin, MD  REFERRING DIAG: myofascial pain syndrome of thoracic spine  Rationale for Evaluation and Treatment: Rehabilitation  THERAPY DIAG:  Pain in thoracic spine  Muscle weakness (generalized)  Other low back pain  ONSET DATE: October 2024 (approximately 3 months after rib fracture in July 2024)  SUBJECTIVE:  PERTINENT HISTORY:  Patient is a 68 y.o. male who presents to outpatient physical therapy with a referral for medical diagnosis of myofascial pain syndrome of thoracic spine. This patient's chief complaints consist of chronic right sided thoracic spine pain, right shoulder pain, and occasional paresthesia to the right UE, leading to the following functional deficits: limits him with everyday activities/tasks to some amount, working on projects, working on the computer, walking for exercise, sleeping, doing as much as he wants to. He never did get into a routine with the workout program.   Relevant past medical  history and comorbidities include bilateral shoulder surgery (RTC repair, R most recent 12/2020) and subarachnoid hemorrhage in 2017. Patient denies hx of cancer, seizures, unexplained weight loss, unexplained changes in bowel or bladder problems, unexplained stumbling or dropping things, back surgery.    SUBJECTIVE STATEMENT: Pt reports current 5/10 pain NPS. Stating PT has helped but it's been a slow healing process.   PAIN: Are you having pain? Yes;  NPRS: 5/10 neck, 3/10 R shoulder, 4/10 R periscapular region, 3/10 left low back (normal level there)      FUNCTIONAL LIMITATIONS: limits him with everyday activities and tasks, working on projects, working on the computer, walking for exercise, sleeping, doing as much as he wants to  [employer]: working in the shop, building and fixing things  PRECAUTIONS: None   WEIGHT BEARING RESTRICTIONS: No   PLOF: Independent  PATIENT GOALS: to get rid of the pain, to get on an exercise and stretching regimen for his overall health, sleep with less difficulty from pain   Next MD appointment: June or July with Dr. Rhesa Celeste  OBJECTIVE    1RM TESTING Bent Over Row with contralateral UE braced (last tested 03/27/2023): 1 RM for Right Arm: 75.8  1 RM for Left Arm: 80.2   Bent Over Shoulder Scaption ("Y"):  (last tested 03/27/2023): 1 RM for Right Arm: 8.9  1 RM for Left Arm: 9.1   TREATMENT                                                                                                                           Therapeutic exercise: therapeutic exercises that incorporate ONE parameter at one or more areas of the body to centralize symptoms, develop strength and endurance, range of motion, and flexibility required for successful completion of functional activities.  Manual cervical traction: 4x30 sec for pain relief and improved cervical joint spacing  Seated cervical spine rotational SNAG with pillow case edge / strap 2x12/side good  understanding   Seated cervical retractions into green ball on wall for resistance: 3x12  OMEGA:   Standing scap retractions: 3x15, 25#   Lat pull down: 1x15, 35#, 2x15, 45#  Plank with alternating row with 10# DB: 2x6/side. Reported as extremely difficult  Standing B shoulder ER with scap retraction: blue TB: 3x10   Standing B shoulder extension with black TB: 3x12        Pt required multimodal  cuing for proper technique and to facilitate improved neuromuscular control, strength, range of motion, and functional ability resulting in improved performance and form.     PATIENT EDUCATION:  Education details: Education on diagnosis, prognosis, POC, anatomy and physiology of current condition. Mechanical traction. Home traction.  Person educated: Patient Education method: Explanation, Demonstration Education comprehension: verbalized understanding and needs further education  HOME EXERCISE PROGRAM: Access Code: [email or web address] URL: [email or web address] Date: 05/17/2023 Prepared by: Alleen Isle  Exercises - Dumbbell Squat at Shoulders  - 1 x daily - 2-3 x weekly - 2 sets - 20 reps - Seated Thoracic Lumbar Extension  - 1 x daily - 1 sets - 20 reps - 5 seconds hold - Standing Row with Anchored Resistance  - 3 x weekly - 2 sets - 20 reps - Resistance Pulldown with March  - 3 x weekly - 2 sets - 20 reps - Curator  - 3 x weekly - 2 sets - 1 min each arm carry time - Sitting Chest Press With Kettlebell  - 3 x weekly - 2 sets - 15 reps - Quadruped Cat Cow  - 1 x daily - 2 sets - 10 reps - Child's Pose with Sidebending  - 1 x daily - 2 sets - 30 hold - Quadruped Rock Back into [employer] Up  - 1 x daily - 1 sets - 10 reps - Prone Press Up  - 1 x daily - 2 sets - 10 reps - Standing Bent Over Single Arm Scapular Row with Table Support with PLB  - 3 x weekly - 2 sets - 15-20 reps - Single Arm Bent Over Shoulder Scaption with Dumbbell  - 3 x weekly - 2 sets -  15-20 reps - Sub-Occipital Cervical Stretch  - 1 x daily - 1 sets - 10 reps - 5 second hold - Thoracic Stretch on Foam Roll - Hands Clasped  - 1-2 sets - 10 reps - Snow Angels on Foam Roll  - 1 x daily - 2 sets - 10 reps - Prone Angels  - Seated Upper Trapezius Stretch  - 1-2 x daily - 2-3 reps - 60 seconds hold  HOME EXERCISE PROGRAM [[id number]] View at "[email or web address]" using code: [email or web address] Prayer Stretch -  Repeat 10 Repetitions, Hold 2 Seconds, Complete 2 Sets, Perform 3 Times a Day  HOME EXERCISE PROGRAM [[id number]] View at [email or web address] using code: [id number] Suboccipital Stretch and C1-C2 Mobilization -  Repeat 20 Repetitions, Hold 2 Seconds, Perform 3 Times a Day  HOME EXERCISE PROGRAM [[id number]] View at [email or web address] using code [email or web address] LAT PULLDOWN - SINGLE ARM  -  Repeat 15 Repetitions, Complete 3 Sets, Perform 1 Times a Day  HOME EXERCISE PROGRAM Created by Alleen Isle, PT, DPT May 5th, 2025 View at [email or web address] code [email or web address] Repeated Shoulder Extension MDT: Perform 10-12 reps every 2-3 hours Cervical Rotation SNAG: Repeat 10 Times Hold 3 Seconds each level 1-2x a day To be more specific with this, line up the edge of the linen at the level of your neck where the stiffness exists. or move along the spine about 1/2 inch lower each time (4-6 levels)  HOME EXERCISE PROGRAM [[id number]] View at [email or web address] code [id number] Retraction in Prone with Overpressure- McKenzie -  Repeat 10 Repetitions, Hold 1 Second(s), Perform 8 Times a Day OR Retraction Extension in Prone- McKenzie -  Repeat 10 Repetitions, Hold 1 Second(s), Perform 8 Times a Day  6 Day Strength-Mobility Exercise Program  Determining how much weight to use with  exercise:  If the goal of the exercise is strength and hypertrophy (muscle growth), choose a heavier weight that is very challenging by the end of 8-12 reps.   If the goal of the exercise is muscle endurance, choose a  lighter weight that feels very challenging after 10-15 reps (up to 20 reps).  If you can do over 20-30 reps with a weight, you likely aren't challenging the muscles enough to make strength/endurance gains.   Repetition recommendations apply to reps that you can do with proper form. For example, if your form fails after 2-3 reps, only do 2-3 reps until you can do more with good form or reduce the weight you are using if possible.    3 Non-Consecutive Days Per Week  Circuits Exercise Sets Reps Rest Interval   Warm Up (can go through twice if needed)   Aerobic Warm Up: Walking (optional)   Modified Prayer Stretch 20 5 second hold    Squats (no weight) 1 10    Supine Snow angels on Foam Roll (head supported)  1 20    Main Workout   A1 Squats with dumbbells on shoulders (8# DBs) 2-3 15   A2 Bent over single arm row  (>=30#) 2-3 8-10   1-2-minute rest following completion of circuit A if desired  B1 Deadlifts with barbell (40# added to bar) 2-3 15   B2 Push ups (modified to surface) 2-3 15   Any stretches you prefer would be great following circuit B   ASSESSMENT:  CLINICAL IMPRESSION: Continuing PT POC addressing cervical impairments, pain, and shoulder pain. Pt with no chang in symptoms for better or worse with treatment today. Continuing to attempt progressions in cervical and periscapular strengthening to assist in pain relief. Patient would benefit from continued management of limiting condition by skilled physical therapist to address remaining impairments and functional limitations to work towards stated goals and return to PLOF or maximal functional independence.   OBJECTIVE IMPAIRMENTS: decreased activity tolerance, decreased endurance, decreased knowledge of condition, decreased mobility, difficulty walking, decreased ROM, decreased strength, hypomobility, impaired perceived functional ability, increased muscle spasms, impaired flexibility, impaired UE functional use, postural  dysfunction, and pain.   ACTIVITY LIMITATIONS: carrying, lifting, bending, sitting, standing, squatting, sleeping, transfers, bed mobility, bathing, dressing, hygiene/grooming, locomotion level, and caring for others  PARTICIPATION LIMITATIONS: meal prep, cleaning, laundry, interpersonal relationship, driving, shopping, community activity, yard work, and everyday activities and tasks, working on projects, working on [employer], walking for exercise, sleeping, doing as much as he wants to   PERSONAL FACTORS: Age, Past/current experiences, Time since onset of injury/illness/exacerbation, and 3+ comorbidities: bilateral shoulder surgery (RTC repair, R most recent 12/2020), bilateral TKA, subarachnoid hemorrhage 2017 with SAH coiling, HTN, hx of hepatitis C (cured), double hernia repair, plantar facial fibromatosis, chronic pain syndrome, former smoker, Chronic bilateral low back pain with bilateral sciatica; Acute bursitis of right shoulder; Lumbar facet arthropathy; Lumbar degenerative disc disease; Localized osteoarthritis of shoulder regions, bilateral; Chronic SI joint pain; Chronic pain syndrome; Pain in joint of left shoulder; Abnormal gait; Anatomical narrow angle; Biomechanical lesion; Cataract nuclear; Derangement of posterior horn of medial meniscus; Edema; Glaucoma suspect of both eyes; Hip pain; History of total knee arthroplasty; Localized, primary osteoarthritis; Hx of subarachnoid hemophage, Muscle weakness; Arthritis; Plantar fascial fibromatosis; Viral hepatitis C; Arthropathy of left wrist; Chronic pain of left heel; Intercostal neuralgia; and Closed fracture of rib of left side with routine healing,  has a past medical history of Arthritis, Hepatitis, Hypertension, Subarachnoid  hemorrhage (HCC) (2017), and Wears hearing aid in both ears.  has a past surgical history that includes Brain surgery; Joint replacement (Bilateral); Shoulder arthroscopy with rotator cuff repair and open biceps  tenodesis (Left, 09/08/2018); and Hernia repair (1963). Also had surgery on right shoulder are also affecting patient's functional outcome.   REHAB POTENTIAL: Good  CLINICAL DECISION MAKING: Evolving/moderate complexity  EVALUATION COMPLEXITY: Moderate   GOALS: Goals reviewed with patient? No  SHORT TERM GOALS: Target date: 03/20/2023  Patient will be independent with initial home exercise program for self-management of symptoms. Baseline: Initial HEP to be provided at visit 2 as appropriate (03/06/23); continued participation in HEP (04/18/23); Goal status: MET   LONG TERM GOALS: Target date: 05/29/2023. Target date updated to 08/28/2023 for all unmet goals on 06/05/2023.  Patient will be independent with a long-term home exercise program for self-management of symptoms.  Baseline: Initial HEP to be provided at visit 2 as appropriate (03/06/23); continued participation in HEP (04/18/23); not participating recently (06/05/2023);  Goal status: In progress  2.  Patient will demonstrate improved FOTO to equal or greater than 56 by visit #13 to demonstrate improvement in overall condition and self-reported functional ability.  Baseline: 48/100 (03/06/23); 47/100 (04/18/23); Goal status: Discontinued due to clinic discontinuing access to measure (06/05/2023)  3.  Patient will demonstrate full thoracic AROM without provocation of thoracic spine pain and tightness to improve his ability to complete tasks such as walking, sweeping, and washing dishes with less difficulty. Baseline: Limitations with extension/flexion and concordant pulling sensation with lateral flexion (03/06/23); 46 degrees of flexion, 15 degrees of extension, continued limitations in ROM with later flexion but not painful with motion (04/18/23); continues with limitations and pain - see objective (06/05/2023);  Goal status: Ongoing  4.  Patient will demonstrate full cervical AROM without provocation of UT pain and thoracic spine  tightness to improve ability to complete ADLs with less difficulty.  Baseline: Limited flexion and left side bending causing UT pain (03/06/23); 44 degrees and no pain with cervical flexion, 11 degrees of left side bending with tightness but no pain (04/18/23); continues with limitations and pain in neck (06/05/2023);  Goal status: In progress  5.  Patient will demonstrate improvement in Patient Specific Functional Scale (PSFS) of equal or greater than 3 points to reflect clinically significant improvement in patient's most valued functional activities. Baseline: To be tested at visit 2 as appropriate (03/06/23); 5/10 at visit #2 (03/13/2023); 7/10 at visit #10 (04/18/23); 6.3/10 (06/05/2023);  Goal status: In progress   PLAN:  PT FREQUENCY: 1-2x/week  PT DURATION: 8-12 weeks  PLANNED INTERVENTIONS: 97164- PT Re-evaluation, 97110-Therapeutic exercises, 97530- Therapeutic activity, 97112- Neuromuscular re-education, 97535- Self Care, 16109- Manual therapy, 97014- Electrical stimulation (unattended), Patient/Family education, Dry Needling, Joint mobilization, Spinal mobilization, Cryotherapy, and Moist heat.  PLAN FOR NEXT SESSION: update HEP as appropriate, progressive postural/UE/functional strengthening, core strengthening, manual therapy as needed, education.   Marc Senior. Fairly IV, PT, DPT Physical Therapist-   [facility]  07/03/23, 8:52 AM

## 2023-07-04 ENCOUNTER — Ambulatory Visit

## 2023-07-04 DIAGNOSIS — M6281 Muscle weakness (generalized): Secondary | ICD-10-CM

## 2023-07-04 DIAGNOSIS — M546 Pain in thoracic spine: Secondary | ICD-10-CM | POA: Diagnosis not present

## 2023-07-04 NOTE — Therapy (Signed)
 OUTPATIENT PHYSICAL THERAPY TREATMENT  Patient Name: Charles Mooney MRN: [id number] DOB:07-05-1955, 67 y.o., male Today's Date: 07/04/2023  END OF SESSION:  PT End of Session - 07/04/23 1735     Visit Number 26    Number of Visits 35    Date for PT Re-Evaluation 08/28/23    Authorization Type MEDICARE PART B reporting period from 06/12/2023    Progress Note Due on Visit 20    PT Start Time 1732    PT Stop Time 1812    PT Time Calculation (min) 40 min    Activity Tolerance Patient tolerated treatment well    Behavior During Therapy [facility] for tasks assessed/performed                    Past Medical History:  Diagnosis Date   Arthritis    Hepatitis    c 2000   Hypertension    Subarachnoid hemorrhage (HCC) 2017   Wears hearing aid in both ears    Past Surgical History:  Procedure Laterality Date   BRAIN SURGERY     [facility] COILING 2017   HERNIA REPAIR  1963   double hernia repair   JOINT REPLACEMENT Bilateral    TKR  L-2012, R-2015   SHOULDER ARTHROSCOPY WITH ROTATOR CUFF REPAIR AND OPEN BICEPS TENODESIS Left 09/08/2018   Procedure: LEFT SHOULDER ARTHROSCOPY,SUBSACAPULARIS REPAIR, SUBACROMIAL DECOMP, DISTAL CLAVICLE EXCISION,BICEP TENODESIS, MINI OPEN REGENTEN PATCH APPLICATION;  Surgeon: Lorri Rota, MD;  Location: [facility] ORS;  Service: Orthopedics;  Laterality: Left;   Patient Active Problem List   Diagnosis Date Noted   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   Closed fracture of rib of left side with routine healing 09/04/2022   Chronic pain of left heel 08/31/2021   Arthropathy of left wrist 10/12/2020   Viral hepatitis C 11/02/2019   Lumbar facet arthropathy 04/27/2019   Lumbar degenerative disc disease 04/27/2019   Localized osteoarthritis of shoulder regions, bilateral 04/27/2019   Encounter for long-term opiate analgesic use 04/27/2019   Chronic SI joint pain 04/27/2019   Chronic pain syndrome 04/27/2019   Pain in joint of left shoulder 06/03/2018   Abnormal  gait 06/18/2017   Anatomical narrow angle 06/18/2017   Derangement of posterior horn of medial meniscus 06/18/2017   Edema 06/18/2017   Hip pain 06/18/2017   History of total knee arthroplasty 06/18/2017   Localized, primary osteoarthritis 06/18/2017   Muscle weakness 06/18/2017   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   Acute bursitis of right shoulder 06/14/2017   Nonallopathic lesion of sacral region 06/14/2017   Nonallopathic lesion of thoracic region 06/14/2017   Nonallopathic lesion of lumbosacral region 06/14/2017   Biomechanical lesion 06/14/2017   Chronic bilateral low back pain with bilateral sciatica 05/23/2017   Arthritis 04/19/2013   Cataract nuclear 06/16/2012   Glaucoma suspect of both eyes 06/16/2012    PCP: Rex Castor, MD  REFERRING PROVIDER: Cephus Collin, MD  REFERRING DIAG: myofascial pain syndrome of thoracic spine  Rationale for Evaluation and Treatment: Rehabilitation  THERAPY DIAG:  Pain in thoracic spine  Muscle weakness (generalized)  ONSET DATE: October 2024 (approximately 3 months after rib fracture in July 2024)  SUBJECTIVE:  PERTINENT HISTORY:  Patient is a 68 y.o. male who presents to outpatient physical therapy with a referral for medical diagnosis of myofascial pain syndrome of thoracic spine. This patient's chief complaints consist of chronic right sided thoracic spine pain, right shoulder pain, and occasional paresthesia to the right UE, leading to the following functional deficits: limits him with everyday activities/tasks to some amount, working on projects, working on the computer, walking for exercise, sleeping, doing as much as he wants to. He never did get into a routine with the workout program.   Relevant past medical history and comorbidities  include bilateral shoulder surgery (RTC repair, R most recent 12/2020) and subarachnoid hemorrhage in 2017. Patient denies hx of cancer, seizures, unexplained weight loss, unexplained changes in bowel or bladder problems, unexplained stumbling or dropping things, back surgery.    SUBJECTIVE STATEMENT: Pt reports 3/10 NPS. Felt a lot better after last session with continuous relief.   PAIN: Are you having pain? Yes;  NPRS: 4/10 neck, 3/10 R shoulder, 4/10 R periscapular region, 3/10 left low back (normal level there)      FUNCTIONAL LIMITATIONS: limits him with everyday activities and tasks, working on projects, working on the computer, walking for exercise, sleeping, doing as much as he wants to  [employer]: working in the shop, building and fixing things  PRECAUTIONS: None   WEIGHT BEARING RESTRICTIONS: No   PLOF: Independent  PATIENT GOALS: to get rid of the pain, to get on an exercise and stretching regimen for his overall health, sleep with less difficulty from pain   Next MD appointment: June or July with Dr. Rhesa Celeste  OBJECTIVE    1RM TESTING Bent Over Row with contralateral UE braced (last tested 03/27/2023): 1 RM for Right Arm: 75.8  1 RM for Left Arm: 80.2   Bent Over Shoulder Scaption ("Y"):  (last tested 03/27/2023): 1 RM for Right Arm: 8.9  1 RM for Left Arm: 9.1   TREATMENT                                                                                                                           Therapeutic exercise: therapeutic exercises that incorporate ONE parameter at one or more areas of the body to centralize symptoms, develop strength and endurance, range of motion, and flexibility required for successful completion of functional activities.  UBE L6, 2 min forward, 2 min backward.   Manual cervical traction: 4x30 sec for pain relief and improved cervical joint spacing  Seated cervical spine rotational SNAG with pillow case edge / strap 2x12/side good  understanding   Seated cervical retractions into green ball on wall for resistance: 3x12  OMEGA:   Standing scap retractions: 3x15, 35#   Lat pull down: 3x10, 55#  Plank with alternating row with 10# DB: 2x6/side. Reported as extremely difficult  Standing B shoulder ER with scap retraction: blue TB: 3x10    Standing B shoulder extension with black TB: 3x12  Pt required multimodal cuing for proper technique and to facilitate improved neuromuscular control, strength, range of motion, and functional ability resulting in improved performance and form.     PATIENT EDUCATION:  Education details: Education on diagnosis, prognosis, POC, anatomy and physiology of current condition. Mechanical traction. Home traction.  Person educated: Patient Education method: Explanation, Demonstration Education comprehension: verbalized understanding and needs further education  HOME EXERCISE PROGRAM: Access Code: [email or web address] URL: [email or web address] Date: 05/17/2023 Prepared by: Alleen Isle  Exercises - Dumbbell Squat at Shoulders  - 1 x daily - 2-3 x weekly - 2 sets - 20 reps - Seated Thoracic Lumbar Extension  - 1 x daily - 1 sets - 20 reps - 5 seconds hold - Standing Row with Anchored Resistance  - 3 x weekly - 2 sets - 20 reps - Resistance Pulldown with March  - 3 x weekly - 2 sets - 20 reps - Curator  - 3 x weekly - 2 sets - 1 min each arm carry time - Sitting Chest Press With Kettlebell  - 3 x weekly - 2 sets - 15 reps - Quadruped Cat Cow  - 1 x daily - 2 sets - 10 reps - Child's Pose with Sidebending  - 1 x daily - 2 sets - 30 hold - Quadruped Rock Back into [employer] Up  - 1 x daily - 1 sets - 10 reps - Prone Press Up  - 1 x daily - 2 sets - 10 reps - Standing Bent Over Single Arm Scapular Row with Table Support with PLB  - 3 x weekly - 2 sets - 15-20 reps - Single Arm Bent Over Shoulder Scaption with Dumbbell  - 3 x weekly - 2 sets - 15-20  reps - Sub-Occipital Cervical Stretch  - 1 x daily - 1 sets - 10 reps - 5 second hold - Thoracic Stretch on Foam Roll - Hands Clasped  - 1-2 sets - 10 reps - Snow Angels on Foam Roll  - 1 x daily - 2 sets - 10 reps - Prone Angels  - Seated Upper Trapezius Stretch  - 1-2 x daily - 2-3 reps - 60 seconds hold  HOME EXERCISE PROGRAM [[id number]] View at "[email or web address]" using code: [email or web address] Prayer Stretch -  Repeat 10 Repetitions, Hold 2 Seconds, Complete 2 Sets, Perform 3 Times a Day  HOME EXERCISE PROGRAM [[id number]] View at [email or web address] using code: [id number] Suboccipital Stretch and C1-C2 Mobilization -  Repeat 20 Repetitions, Hold 2 Seconds, Perform 3 Times a Day  HOME EXERCISE PROGRAM [[id number]] View at [email or web address] using code [email or web address] LAT PULLDOWN - SINGLE ARM  -  Repeat 15 Repetitions, Complete 3 Sets, Perform 1 Times a Day  HOME EXERCISE PROGRAM Created by Alleen Isle, PT, DPT May 5th, 2025 View at [email or web address] code [email or web address] Repeated Shoulder Extension MDT: Perform 10-12 reps every 2-3 hours Cervical Rotation SNAG: Repeat 10 Times Hold 3 Seconds each level 1-2x a day To be more specific with this, line up the edge of the linen at the level of your neck where the stiffness exists. or move along the spine about 1/2 inch lower each time (4-6 levels)  HOME EXERCISE PROGRAM [[id number]] View at [email or web address] code [id number] Retraction in Prone with Overpressure- McKenzie -  Repeat 10 Repetitions, Hold 1 Second(s), Perform 8 Times a Day OR Retraction Extension in Prone- McKenzie -  Repeat 10 Repetitions, Hold 1 Second(s), Perform 8 Times a Day  6 Day Strength-Mobility Exercise Program  Determining how much weight  to use with exercise:  If the goal of the exercise is strength and hypertrophy (muscle growth), choose a heavier weight that is very challenging by the end of 8-12 reps.   If the goal of the exercise is muscle endurance, choose a lighter  weight that feels very challenging after 10-15 reps (up to 20 reps).  If you can do over 20-30 reps with a weight, you likely aren't challenging the muscles enough to make strength/endurance gains.   Repetition recommendations apply to reps that you can do with proper form. For example, if your form fails after 2-3 reps, only do 2-3 reps until you can do more with good form or reduce the weight you are using if possible.    3 Non-Consecutive Days Per Week  Circuits Exercise Sets Reps Rest Interval   Warm Up (can go through twice if needed)   Aerobic Warm Up: Walking (optional)   Modified Prayer Stretch 20 5 second hold    Squats (no weight) 1 10    Supine Snow angels on Foam Roll (head supported)  1 20    Main Workout   A1 Squats with dumbbells on shoulders (8# DBs) 2-3 15   A2 Bent over single arm row  (>=30#) 2-3 8-10   1-2-minute rest following completion of circuit A if desired  B1 Deadlifts with barbell (40# added to bar) 2-3 15   B2 Push ups (modified to surface) 2-3 15   Any stretches you prefer would be great following circuit B   ASSESSMENT:  CLINICAL IMPRESSION: Continuing PT POC addressing cervical impairments, pain, and shoulder pain. Due to excellent pain response last session continuing with pretty consistent/similar session with some increases in resistance. Pt responding promisingly to periscapular loading for R shoulder pain and neck pain. Encouraged compliance with HEP and PT to continue for long term carryover. Patient would benefit from continued management of limiting condition by skilled physical therapist to address remaining impairments and functional limitations to work towards stated goals and return to PLOF or maximal functional independence.    OBJECTIVE IMPAIRMENTS: decreased activity tolerance, decreased endurance, decreased knowledge of condition, decreased mobility, difficulty walking, decreased ROM, decreased strength, hypomobility, impaired  perceived functional ability, increased muscle spasms, impaired flexibility, impaired UE functional use, postural dysfunction, and pain.   ACTIVITY LIMITATIONS: carrying, lifting, bending, sitting, standing, squatting, sleeping, transfers, bed mobility, bathing, dressing, hygiene/grooming, locomotion level, and caring for others  PARTICIPATION LIMITATIONS: meal prep, cleaning, laundry, interpersonal relationship, driving, shopping, community activity, yard work, and everyday activities and tasks, working on projects, working on [employer], walking for exercise, sleeping, doing as much as he wants to   PERSONAL FACTORS: Age, Past/current experiences, Time since onset of injury/illness/exacerbation, and 3+ comorbidities: bilateral shoulder surgery (RTC repair, R most recent 12/2020), bilateral TKA, subarachnoid hemorrhage 2017 with SAH coiling, HTN, hx of hepatitis C (cured), double hernia repair, plantar facial fibromatosis, chronic pain syndrome, former smoker, Chronic bilateral low back pain with bilateral sciatica; Acute bursitis of right shoulder; Lumbar facet arthropathy; Lumbar degenerative disc disease; Localized osteoarthritis of shoulder regions, bilateral; Chronic SI joint pain; Chronic pain syndrome; Pain in joint of left shoulder; Abnormal gait; Anatomical narrow angle; Biomechanical lesion; Cataract nuclear; Derangement of posterior horn of medial meniscus; Edema; Glaucoma suspect of both eyes; Hip pain; History of total knee arthroplasty; Localized, primary osteoarthritis; Hx of subarachnoid hemophage, Muscle weakness; Arthritis; Plantar fascial fibromatosis; Viral hepatitis C; Arthropathy of left wrist; Chronic pain of left heel; Intercostal neuralgia; and Closed fracture  of rib of left side with routine healing,  has a past medical history of Arthritis, Hepatitis, Hypertension, Subarachnoid hemorrhage (HCC) (2017), and Wears hearing aid in both ears.  has a past surgical history that includes  Brain surgery; Joint replacement (Bilateral); Shoulder arthroscopy with rotator cuff repair and open biceps tenodesis (Left, 09/08/2018); and Hernia repair (1963). Also had surgery on right shoulder are also affecting patient's functional outcome.   REHAB POTENTIAL: Good  CLINICAL DECISION MAKING: Evolving/moderate complexity  EVALUATION COMPLEXITY: Moderate   GOALS: Goals reviewed with patient? No  SHORT TERM GOALS: Target date: 03/20/2023  Patient will be independent with initial home exercise program for self-management of symptoms. Baseline: Initial HEP to be provided at visit 2 as appropriate (03/06/23); continued participation in HEP (04/18/23); Goal status: MET   LONG TERM GOALS: Target date: 05/29/2023. Target date updated to 08/28/2023 for all unmet goals on 06/05/2023.  Patient will be independent with a long-term home exercise program for self-management of symptoms.  Baseline: Initial HEP to be provided at visit 2 as appropriate (03/06/23); continued participation in HEP (04/18/23); not participating recently (06/05/2023);  Goal status: In progress  2.  Patient will demonstrate improved FOTO to equal or greater than 56 by visit #13 to demonstrate improvement in overall condition and self-reported functional ability.  Baseline: 48/100 (03/06/23); 47/100 (04/18/23); Goal status: Discontinued due to clinic discontinuing access to measure (06/05/2023)  3.  Patient will demonstrate full thoracic AROM without provocation of thoracic spine pain and tightness to improve his ability to complete tasks such as walking, sweeping, and washing dishes with less difficulty. Baseline: Limitations with extension/flexion and concordant pulling sensation with lateral flexion (03/06/23); 46 degrees of flexion, 15 degrees of extension, continued limitations in ROM with later flexion but not painful with motion (04/18/23); continues with limitations and pain - see objective (06/05/2023);  Goal status:  Ongoing  4.  Patient will demonstrate full cervical AROM without provocation of UT pain and thoracic spine tightness to improve ability to complete ADLs with less difficulty.  Baseline: Limited flexion and left side bending causing UT pain (03/06/23); 44 degrees and no pain with cervical flexion, 11 degrees of left side bending with tightness but no pain (04/18/23); continues with limitations and pain in neck (06/05/2023);  Goal status: In progress  5.  Patient will demonstrate improvement in Patient Specific Functional Scale (PSFS) of equal or greater than 3 points to reflect clinically significant improvement in patient's most valued functional activities. Baseline: To be tested at visit 2 as appropriate (03/06/23); 5/10 at visit #2 (03/13/2023); 7/10 at visit #10 (04/18/23); 6.3/10 (06/05/2023);  Goal status: In progress   PLAN:  PT FREQUENCY: 1-2x/week  PT DURATION: 8-12 weeks  PLANNED INTERVENTIONS: 97164- PT Re-evaluation, 97110-Therapeutic exercises, 97530- Therapeutic activity, 97112- Neuromuscular re-education, 97535- Self Care, [id number]- Manual therapy, 97014- Electrical stimulation (unattended), Patient/Family education, Dry Needling, Joint mobilization, Spinal mobilization, Cryotherapy, and Moist heat.  PLAN FOR NEXT SESSION: update HEP as appropriate, progressive postural/UE/functional strengthening, core strengthening, manual therapy as needed, education.   Marc Senior. Fairly IV, PT, DPT Physical Therapist- [facility]  [facility]  07/04/23, 6:13 PM

## 2023-07-09 ENCOUNTER — Encounter: Payer: Self-pay | Admitting: Physical Therapy

## 2023-07-09 ENCOUNTER — Ambulatory Visit: Admitting: Physical Therapy

## 2023-07-09 DIAGNOSIS — M546 Pain in thoracic spine: Secondary | ICD-10-CM

## 2023-07-09 DIAGNOSIS — M6281 Muscle weakness (generalized): Secondary | ICD-10-CM

## 2023-07-09 NOTE — Therapy (Signed)
 OUTPATIENT PHYSICAL THERAPY TREATMENT  Patient Name: Charles GONGAWARE MRN: [id number] DOB:August 18, 1955, 68 y.o., male Today's Date: 07/09/2023  END OF SESSION:  PT End of Session - 07/09/23 1957     Visit Number 27    Number of Visits 35    Date for PT Re-Evaluation 08/28/23    Authorization Type MEDICARE PART B reporting period from 06/12/2023    Progress Note Due on Visit 20    PT Start Time 0951    PT Stop Time 1045    PT Time Calculation (min) 54 min    Activity Tolerance Patient tolerated treatment well    Behavior During Therapy [facility] for tasks assessed/performed                     Past Medical History:  Diagnosis Date   Arthritis    Hepatitis    c 2000   Hypertension    Subarachnoid hemorrhage (HCC) 2017   Wears hearing aid in both ears    Past Surgical History:  Procedure Laterality Date   BRAIN SURGERY     [facility] COILING 2017   HERNIA REPAIR  1963   double hernia repair   JOINT REPLACEMENT Bilateral    TKR  L-2012, R-2015   SHOULDER ARTHROSCOPY WITH ROTATOR CUFF REPAIR AND OPEN BICEPS TENODESIS Left 09/08/2018   Procedure: LEFT SHOULDER ARTHROSCOPY,SUBSACAPULARIS REPAIR, SUBACROMIAL DECOMP, DISTAL CLAVICLE EXCISION,BICEP TENODESIS, MINI OPEN REGENTEN PATCH APPLICATION;  Surgeon: Lorri Rota, MD;  Location: [facility] ORS;  Service: Orthopedics;  Laterality: Left;   Patient Active Problem List   Diagnosis Date Noted   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   Closed fracture of rib of left side with routine healing 09/04/2022   Chronic pain of left heel 08/31/2021   Arthropathy of left wrist 10/12/2020   Viral hepatitis C 11/02/2019   Lumbar facet arthropathy 04/27/2019   Lumbar degenerative disc disease 04/27/2019   Localized osteoarthritis of shoulder regions, bilateral 04/27/2019   Encounter for long-term opiate analgesic use 04/27/2019   Chronic SI joint pain 04/27/2019   Chronic pain syndrome 04/27/2019   Pain in joint of left shoulder 06/03/2018   Abnormal  gait 06/18/2017   Anatomical narrow angle 06/18/2017   Derangement of posterior horn of medial meniscus 06/18/2017   Edema 06/18/2017   Hip pain 06/18/2017   History of total knee arthroplasty 06/18/2017   Localized, primary osteoarthritis 06/18/2017   Muscle weakness 06/18/2017   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   Acute bursitis of right shoulder 06/14/2017   Nonallopathic lesion of sacral region 06/14/2017   Nonallopathic lesion of thoracic region 06/14/2017   Nonallopathic lesion of lumbosacral region 06/14/2017   Biomechanical lesion 06/14/2017   Chronic bilateral low back pain with bilateral sciatica 05/23/2017   Arthritis 04/19/2013   Cataract nuclear 06/16/2012   Glaucoma suspect of both eyes 06/16/2012    PCP: Rex Castor, MD  REFERRING PROVIDER: Cephus Collin, MD  REFERRING DIAG: myofascial pain syndrome of thoracic spine  Rationale for Evaluation and Treatment: Rehabilitation  THERAPY DIAG:  Pain in thoracic spine  Muscle weakness (generalized)  ONSET DATE: October 2024 (approximately 3 months after rib fracture in July 2024)  SUBJECTIVE:  PERTINENT HISTORY:  Patient is a 68 y.o. male who presents to outpatient physical therapy with a referral for medical diagnosis of myofascial pain syndrome of thoracic spine. This patient's chief complaints consist of chronic right sided thoracic spine pain, right shoulder pain, and occasional paresthesia to the right UE, leading to the following functional deficits: limits him with everyday activities/tasks to some amount, working on projects, working on the computer, walking for exercise, sleeping, doing as much as he wants to. He never did get into a routine with the workout program.   Relevant past medical history and comorbidities  include bilateral shoulder surgery (RTC repair, R most recent 12/2020) and subarachnoid hemorrhage in 2017. Patient denies hx of cancer, seizures, unexplained weight loss, unexplained changes in bowel or bladder problems, unexplained stumbling or dropping things, back surgery.   Exercise history: he used to do bench presses, flies, bent over rows, lat pull in front and behind head, curls, triceps extensions overhead. He worked out at home 2-3/x a week but he always struggled to be consistent. 45-60 minutes. He stopped approx 10 years ago when he started building his current house. Then he had a stroke and then it was all he could do to work. He did not have any extra energy or willpower at that point. He is currently doing all of this for his grandkids.   Sees someone for talk therapy 1x a month (50 minutes) with the same person. He is limited by the fact he has to pay out of pocket. He takes celexa and his GP switched him to Wellbutrin. He did not like this so he went back to celexa. He has not tried any other medications for that. He had hepatitis C in the early 90s. He was put on interferon and rivovinan. That caused some really serious depression and was put on something then but cannot remember what it was (it might have been amitriptyline, and it was wild). He feels his depression got fully blown and noticeable about 3 years ago, around the time he got his shoulder done. At that time he made a decision to move at that time and it really triggered it because he built the house he is in now and he had planned to retire there. He has not moved yet, but he is currently packing. He also feels like his depression is also related to not being in shape. He thinks if he could get his pain and physical conditioning under control, his depression would be much better.    SUBJECTIVE STATEMENT: Patient states his neck is gradually getting better. He states the pain is significantly better, but the tightness is still  there.  He liked all of the things from the last two visits but would prefer to do mechanical traction instead of manual traction. His R shoulder is continuing to bother him, and his R scapula pain is being the same.  Patient has been doing nothing at home for HEP. He was doing the rotational SNAG and stretching the shoulder behind him with a band into kneeling. He has not done anything except come to see PT, take pain pills (oxycodone ), drink beer, and smoke an occasional cigarette since current PT went on vacation last week. He would like help getting himself to do regular exercise that he wants to do. He cannot figure out how to get himself to to the exercises he needs to do.   PAIN: Are you having pain? Yes;  NPRS: 4/10 neck, 4/10 R shoulder, 3/10  R periscapular region     FUNCTIONAL LIMITATIONS: limits him with everyday activities and tasks, working on projects, working on the computer, walking for exercise, sleeping, doing as much as he wants to  [employer]: working in the shop, building and fixing things  PRECAUTIONS: None   WEIGHT BEARING RESTRICTIONS: No   PLOF: Independent  PATIENT GOALS: to get rid of the pain, to get on an exercise and stretching regimen for his overall health, sleep with less difficulty from pain   Next MD appointment: June or July with Dr. Rhesa Celeste  OBJECTIVE    1RM TESTING Bent Over Row with contralateral UE braced (last tested 03/27/2023): 1 RM for Right Arm: 75.8  1 RM for Left Arm: 80.2  Bent Over Shoulder Scaption ("Y"):  (last tested 03/27/2023): 1 RM for Right Arm: 8.9  1 RM for Left Arm: 9.1   TREATMENT                                                                                                                           Therapeutic exercise: therapeutic exercises that incorporate ONE parameter at one or more areas of the body to centralize symptoms, develop strength and endurance, range of motion, and flexibility required for successful  completion of functional activities.  UBE L6, 2, 1 min forward, 2, 1 min backward.   Seated cervical spine rotational SNAG with pillow case edge / strap 2x12/side good understanding   Seated cervical retractions into green ball on wall for resistance: 3x12  OMEGA superset:   Standing scap retractions: 3x15, 35#   Lat pull down: 3x10, 55#  Motivational interviewing, problem solving, and education around behavior change. Suggested contacting supplementary insurance company to see if they offer any support programs or bringing it up with his counselor. Also reviewed different health professionals that treat mental health and their roles (GP, psychiatrist, counselor/talk therapist).   Mechanical Traction: to improve cervical spine and thoracic pain, and help decrease muscle tension.  Type: Cervical Min (lb): 0 Max (lb): 30-35  Hold time: 30 seconds Rest time: 10 seconds Pull angle: ~20 degrees Time: 15 plus set up and take down Result:  good tolerance during, no complaints after  Pt required multimodal cuing for proper technique and to facilitate improved neuromuscular control, strength, range of motion, and functional ability resulting in improved performance and form.   PATIENT EDUCATION:  Education details: Education on diagnosis, prognosis, POC, anatomy and physiology of current condition. Mechanical traction. Home traction.  Person educated: Patient Education method: Explanation, Demonstration Education comprehension: verbalized understanding and needs further education  HOME EXERCISE PROGRAM: Access Code: [email or web address] URL: [email or web address] Date: 05/17/2023 Prepared by: Alleen Isle  Exercises - Dumbbell Squat at Shoulders  - 1 x daily - 2-3 x weekly - 2 sets - 20 reps - Seated Thoracic Lumbar Extension  - 1 x daily - 1 sets - 20 reps - 5 seconds hold - Standing Row with Anchored Resistance  - 3 x weekly -  2 sets - 20 reps - Resistance Pulldown with March  -  3 x weekly - 2 sets - 20 reps - Curator  - 3 x weekly - 2 sets - 1 min each arm carry time - Sitting Chest Press With Kettlebell  - 3 x weekly - 2 sets - 15 reps - Quadruped Cat Cow  - 1 x daily - 2 sets - 10 reps - Child's Pose with Sidebending  - 1 x daily - 2 sets - 30 hold - Quadruped Rock Back into [employer] Up  - 1 x daily - 1 sets - 10 reps - Prone Press Up  - 1 x daily - 2 sets - 10 reps - Standing Bent Over Single Arm Scapular Row with Table Support with PLB  - 3 x weekly - 2 sets - 15-20 reps - Single Arm Bent Over Shoulder Scaption with Dumbbell  - 3 x weekly - 2 sets - 15-20 reps - Sub-Occipital Cervical Stretch  - 1 x daily - 1 sets - 10 reps - 5 second hold - Thoracic Stretch on Foam Roll - Hands Clasped  - 1-2 sets - 10 reps - Snow Angels on Foam Roll  - 1 x daily - 2 sets - 10 reps - Prone Angels  - Seated Upper Trapezius Stretch  - 1-2 x daily - 2-3 reps - 60 seconds hold  HOME EXERCISE PROGRAM [[id number]] View at "[email or web address]" using code: [email or web address] Prayer Stretch -  Repeat 10 Repetitions, Hold 2 Seconds, Complete 2 Sets, Perform 3 Times a Day  HOME EXERCISE PROGRAM [[id number]] View at [email or web address] using code: [id number] Suboccipital Stretch and C1-C2 Mobilization -  Repeat 20 Repetitions, Hold 2 Seconds, Perform 3 Times a Day  HOME EXERCISE PROGRAM [[email or web address]] View at [email or web address] using code [email or web address] LAT PULLDOWN - SINGLE ARM  -  Repeat 15 Repetitions, Complete 3 Sets, Perform 1 Times a Day  HOME EXERCISE PROGRAM Created by Alleen Isle, PT, DPT May 5th, 2025 View at [email or web address] code [email or web address] Repeated Shoulder Extension MDT: Perform 10-12 reps every 2-3 hours Cervical Rotation SNAG: Repeat 10 Times Hold 3 Seconds each level 1-2x a day To be more specific with this, line up the edge of the linen at the level of your neck where the stiffness exists. or move along the spine about 1/2 inch lower each time (4-6  levels)  HOME EXERCISE PROGRAM [[id number]] View at [email or web address] code [id number] Retraction in Prone with Overpressure- McKenzie -  Repeat 10 Repetitions, Hold 1 Second(s), Perform 8 Times a Day OR Retraction Extension in Prone- McKenzie -  Repeat 10 Repetitions, Hold 1 Second(s), Perform 8 Times a Day    ASSESSMENT:  CLINICAL IMPRESSION: Attempted to keep today's session similar to last with substitution of mechanical traction for manual traction per patient request. Did not complete all planned exercises due to time spent collecting further information and doing education for the purposes of facilitating behavior change and mental health needs of patient. Plan to consolidate HEP next session and provide patient with resources to help organize his thoughts around why he wants to change and improve realistic targets for incremental change including doing his HEP and fitness program. He appears to be moving between the contemplation phase, preparation phase, and action phase but has not found sustainable action to help him meet his behavioral goals that include physical activity and HEP goals. His pain does seem to have improved after he stopped working on the floor in his home. He does appear  to be distressed about moving away from his home and has not found a new place to live that has the features like his workshop that he carefully crafted into his current home. This is likely affecting his motivation. Patient would benefit from continued management of limiting condition by skilled physical therapist to address remaining impairments and functional limitations to work towards stated goals and return to PLOF or maximal functional independence.    OBJECTIVE IMPAIRMENTS: decreased activity tolerance, decreased endurance, decreased knowledge of condition, decreased mobility, difficulty walking, decreased ROM, decreased strength, hypomobility, impaired perceived functional ability, increased muscle  spasms, impaired flexibility, impaired UE functional use, postural dysfunction, and pain.   ACTIVITY LIMITATIONS: carrying, lifting, bending, sitting, standing, squatting, sleeping, transfers, bed mobility, bathing, dressing, hygiene/grooming, locomotion level, and caring for others  PARTICIPATION LIMITATIONS: meal prep, cleaning, laundry, interpersonal relationship, driving, shopping, community activity, yard work, and everyday activities and tasks, working on projects, working on [employer], walking for exercise, sleeping, doing as much as he wants to   PERSONAL FACTORS: Age, Past/current experiences, Time since onset of injury/illness/exacerbation, and 3+ comorbidities: bilateral shoulder surgery (RTC repair, R most recent 12/2020), bilateral TKA, subarachnoid hemorrhage 2017 with SAH coiling, HTN, hx of hepatitis C (cured), double hernia repair, plantar facial fibromatosis, chronic pain syndrome, former smoker, Chronic bilateral low back pain with bilateral sciatica; Acute bursitis of right shoulder; Lumbar facet arthropathy; Lumbar degenerative disc disease; Localized osteoarthritis of shoulder regions, bilateral; Chronic SI joint pain; Chronic pain syndrome; Pain in joint of left shoulder; Abnormal gait; Anatomical narrow angle; Biomechanical lesion; Cataract nuclear; Derangement of posterior horn of medial meniscus; Edema; Glaucoma suspect of both eyes; Hip pain; History of total knee arthroplasty; Localized, primary osteoarthritis; Hx of subarachnoid hemophage, Muscle weakness; Arthritis; Plantar fascial fibromatosis; Viral hepatitis C; Arthropathy of left wrist; Chronic pain of left heel; Intercostal neuralgia; and Closed fracture of rib of left side with routine healing,  has a past medical history of Arthritis, Hepatitis, Hypertension, Subarachnoid hemorrhage (HCC) (2017), and Wears hearing aid in both ears.  has a past surgical history that includes Brain surgery; Joint replacement (Bilateral);  Shoulder arthroscopy with rotator cuff repair and open biceps tenodesis (Left, 09/08/2018); and Hernia repair (1963). Also had surgery on right shoulder are also affecting patient's functional outcome.   REHAB POTENTIAL: Good  CLINICAL DECISION MAKING: Evolving/moderate complexity  EVALUATION COMPLEXITY: Moderate   GOALS: Goals reviewed with patient? No  SHORT TERM GOALS: Target date: 03/20/2023  Patient will be independent with initial home exercise program for self-management of symptoms. Baseline: Initial HEP to be provided at visit 2 as appropriate (03/06/23); continued participation in HEP (04/18/23); Goal status: MET   LONG TERM GOALS: Target date: 05/29/2023. Target date updated to 08/28/2023 for all unmet goals on 06/05/2023.  Patient will be independent with a long-term home exercise program for self-management of symptoms.  Baseline: Initial HEP to be provided at visit 2 as appropriate (03/06/23); continued participation in HEP (04/18/23); not participating recently (06/05/2023);  Goal status: In progress  2.  Patient will demonstrate improved FOTO to equal or greater than 56 by visit #13 to demonstrate improvement in overall condition and self-reported functional ability.  Baseline: 48/100 (03/06/23); 47/100 (04/18/23); Goal status: Discontinued due to clinic discontinuing access to measure (06/05/2023)  3.  Patient will demonstrate full thoracic AROM without provocation of thoracic spine pain and tightness to improve his ability to complete tasks such as walking, sweeping, and washing dishes with less difficulty. Baseline: Limitations with extension/flexion and  concordant pulling sensation with lateral flexion (03/06/23); 46 degrees of flexion, 15 degrees of extension, continued limitations in ROM with later flexion but not painful with motion (04/18/23); continues with limitations and pain - see objective (06/05/2023);  Goal status: Ongoing  4.  Patient will demonstrate full cervical  AROM without provocation of UT pain and thoracic spine tightness to improve ability to complete ADLs with less difficulty.  Baseline: Limited flexion and left side bending causing UT pain (03/06/23); 44 degrees and no pain with cervical flexion, 11 degrees of left side bending with tightness but no pain (04/18/23); continues with limitations and pain in neck (06/05/2023);  Goal status: In progress  5.  Patient will demonstrate improvement in Patient Specific Functional Scale (PSFS) of equal or greater than 3 points to reflect clinically significant improvement in patient's most valued functional activities. Baseline: To be tested at visit 2 as appropriate (03/06/23); 5/10 at visit #2 (03/13/2023); 7/10 at visit #10 (04/18/23); 6.3/10 (06/05/2023);  Goal status: In progress   PLAN:  PT FREQUENCY: 1-2x/week  PT DURATION: 8-12 weeks  PLANNED INTERVENTIONS: 97164- PT Re-evaluation, 97110-Therapeutic exercises, 97530- Therapeutic activity, 97112- Neuromuscular re-education, 97535- Self Care, 16109- Manual therapy, 97014- Electrical stimulation (unattended), Patient/Family education, Dry Needling, Joint mobilization, Spinal mobilization, Cryotherapy, and Moist heat.  PLAN FOR NEXT SESSION: update HEP as appropriate, progressive postural/UE/functional strengthening, core strengthening, manual therapy as needed, education.   Carilyn Charles. Artemio Larry, PT, DPT 07/09/23, 8:09 PM  [facility] [address] [place], [place] [address] P: [phone] I F: [phone]

## 2023-07-11 ENCOUNTER — Ambulatory Visit: Admitting: Physical Therapy

## 2023-07-11 DIAGNOSIS — M546 Pain in thoracic spine: Secondary | ICD-10-CM | POA: Diagnosis not present

## 2023-07-11 NOTE — Therapy (Signed)
 OUTPATIENT PHYSICAL THERAPY TREATMENT  Patient Name: Charles Mooney MRN: [id number] DOB:[birth year], 68 y.o., male Today's Date: 07/11/2023  END OF SESSION:  PT End of Session - 07/11/23 1844     Visit Number 28    Number of Visits 35    Date for PT Re-Evaluation 08/28/23    Authorization Type MEDICARE PART B reporting period from 06/12/2023    Progress Note Due on Visit 20    PT Start Time 1819    PT Stop Time 1846    PT Time Calculation (min) 27 min    Activity Tolerance Patient tolerated treatment well    Behavior During Therapy [facility] for tasks assessed/performed                      Past Medical History:  Diagnosis Date   Arthritis    Hepatitis    c 2000   Hypertension    Subarachnoid hemorrhage (HCC) 2017   Wears hearing aid in both ears    Past Surgical History:  Procedure Laterality Date   BRAIN SURGERY     [facility] COILING 2017   HERNIA REPAIR  1963   double hernia repair   JOINT REPLACEMENT Bilateral    TKR  L-2012, R-2015   SHOULDER ARTHROSCOPY WITH ROTATOR CUFF REPAIR AND OPEN BICEPS TENODESIS Left 09/08/2018   Procedure: LEFT SHOULDER ARTHROSCOPY,SUBSACAPULARIS REPAIR, SUBACROMIAL DECOMP, DISTAL CLAVICLE EXCISION,BICEP TENODESIS, MINI OPEN REGENTEN PATCH APPLICATION;  Surgeon: Lorri Rota, MD;  Location: [facility] ORS;  Service: Orthopedics;  Laterality: Left;   Patient Active Problem List   Diagnosis Date Noted   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   Closed fracture of rib of left side with routine healing 09/04/2022   Chronic pain of left heel 08/31/2021   Arthropathy of left wrist 10/12/2020   Viral hepatitis C 11/02/2019   Lumbar facet arthropathy 04/27/2019   Lumbar degenerative disc disease 04/27/2019   Localized osteoarthritis of shoulder regions, bilateral 04/27/2019   Encounter for long-term opiate analgesic use 04/27/2019   Chronic SI joint pain 04/27/2019   Chronic pain syndrome 04/27/2019   Pain in joint of left shoulder 06/03/2018    Abnormal gait 06/18/2017   Anatomical narrow angle 06/18/2017   Derangement of posterior horn of medial meniscus 06/18/2017   Edema 06/18/2017   Hip pain 06/18/2017   History of total knee arthroplasty 06/18/2017   Localized, primary osteoarthritis 06/18/2017   Muscle weakness 06/18/2017   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   Acute bursitis of right shoulder 06/14/2017   Nonallopathic lesion of sacral region 06/14/2017   Nonallopathic lesion of thoracic region 06/14/2017   Nonallopathic lesion of lumbosacral region 06/14/2017   Biomechanical lesion 06/14/2017   Chronic bilateral low back pain with bilateral sciatica 05/23/2017   Arthritis 04/19/2013   Cataract nuclear 06/16/2012   Glaucoma suspect of both eyes 06/16/2012    PCP: Rex Castor, MD  REFERRING PROVIDER: Cephus Collin, MD  REFERRING DIAG: myofascial pain syndrome of thoracic spine  Rationale for Evaluation and Treatment: Rehabilitation  THERAPY DIAG:  Pain in thoracic spine  ONSET DATE: October 2024 (approximately 3 months after rib fracture in July 2024)  SUBJECTIVE:  PERTINENT HISTORY:  Patient is a 68 y.o. male who presents to outpatient physical therapy with a referral for medical diagnosis of myofascial pain syndrome of thoracic spine. This patient's chief complaints consist of chronic right sided thoracic spine pain, right shoulder pain, and occasional paresthesia to the right UE, leading to the following functional deficits: limits him with everyday activities/tasks to some amount, working on projects, working on the computer, walking for exercise, sleeping, doing as much as he wants to. He never did get into a routine with the workout program.   Relevant past medical history and comorbidities include bilateral  shoulder surgery (RTC repair, R most recent 12/2020) and subarachnoid hemorrhage in 2017. Patient denies hx of cancer, seizures, unexplained weight loss, unexplained changes in bowel or bladder problems, unexplained stumbling or dropping things, back surgery.   Exercise history: he used to do bench presses, flies, bent over rows, lat pull in front and behind head, curls, triceps extensions overhead. He worked out at home 2-3/x a week but he always struggled to be consistent. 45-60 minutes. He stopped approx 10 years ago when he started building his current house. Then he had a stroke and then it was all he could do to work. He did not have any extra energy or willpower at that point. He is currently doing all of this for his grandkids.   Sees someone for talk therapy 1x a month (50 minutes) with the same person. He is limited by the fact he has to pay out of pocket. He takes celexa and his GP switched him to Wellbutrin. He did not like this so he went back to celexa. He has not tried any other medications for that. He had hepatitis C in the early 90s. He was put on interferon and rivovinan. That caused some really serious depression and was put on something then but cannot remember what it was (it might have been amitriptyline, and it was wild). He feels his depression got fully blown and noticeable about 3 years ago, around the time he got his shoulder done. At that time he made a decision to move at that time and it really triggered it because he built the house he is in now and he had planned to retire there. He has not moved yet, but he is currently packing. He also feels like his depression is also related to not being in shape. He thinks if he could get his pain and physical conditioning under control, his depression would be much better.    SUBJECTIVE STATEMENT: Patient states his neck continues to feel better. He was running electrical and insulation this morning. Shoulder is better. Scapula is the  same that it has been for the last 3-4 weeks but better than what we started with. He did not call his insurance company.   PAIN: Are you having pain? Yes;  NPRS: 3/10 neck, 4/10 R shoulder, 3/10 R periscapular region     FUNCTIONAL LIMITATIONS: limits him with everyday activities and tasks, working on projects, working on the computer, walking for exercise, sleeping, doing as much as he wants to  [employer]: working in the shop, building and fixing things  PRECAUTIONS: None   WEIGHT BEARING RESTRICTIONS: No   PLOF: Independent  PATIENT GOALS: to get rid of the pain, to get on an exercise and stretching regimen for his overall health, sleep with less difficulty from pain   Next MD appointment: June or July with Dr. Rhesa Celeste  OBJECTIVE    1RM TESTING  Bent Over Row with contralateral UE braced (last tested 03/27/2023): 1 RM for Right Arm: 75.8  1 RM for Left Arm: 80.2  Bent Over Shoulder Scaption ("Y"):  (last tested 03/27/2023): 1 RM for Right Arm: 8.9  1 RM for Left Arm: 9.1   TREATMENT                                                                                                                           Therapeutic exercise: therapeutic exercises that incorporate ONE parameter at one or more areas of the body to centralize symptoms, develop strength and endurance, range of motion, and flexibility required for successful completion of functional activities.  UBE L6, 3 min forward, 3 min backward.   Seated cervical spine rotational SNAG with strap 2x12 each side  Seated cervical retractions into blue ball on wall for resistance:  3x12  OMEGA superset:   Standing scap retractions: 3x15, 35#   Lat pull down: 3x10, 55#   Standing B shoulder ER with scap retraction 3x10 with blue TB   Standing B shoulder extension  3x12 with black  Therapeutic activities: dynamic therapeutic activities incorporating MULTIPLE parameters or areas of the body designed to achieve improved  functional performance.  Plank with alternating rowing  with 10# DB 3x4-6/side.  Reported as extremely difficult Cuing to keep buttocks down Difficulty with weakness of R shoulder as supporting  Mechanical Traction: to improve cervical spine and thoracic pain, and help decrease muscle tension.  Type: Cervical Min (lb): 0 Max (lb): 30-35  Hold time: 30 seconds Rest time: 10 seconds Pull angle: ~20 degrees Time: 15 plus set up and take down Result:  good tolerance during, no complaints after  Pt required multimodal cuing for proper technique and to facilitate improved neuromuscular control, strength, range of motion, and functional ability resulting in improved performance and form.   PATIENT EDUCATION:  Education details: Education on diagnosis, prognosis, POC, anatomy and physiology of current condition. Mechanical traction. Home traction.  Person educated: Patient Education method: Explanation, Demonstration Education comprehension: verbalized understanding and needs further education  HOME EXERCISE PROGRAM: Access Code: [email or web address] URL: [email or web address] Date: 05/17/2023 Prepared by: Alleen Isle  Exercises - Dumbbell Squat at Shoulders  - 1 x daily - 2-3 x weekly - 2 sets - 20 reps - Seated Thoracic Lumbar Extension  - 1 x daily - 1 sets - 20 reps - 5 seconds hold - Standing Row with Anchored Resistance  - 3 x weekly - 2 sets - 20 reps - Resistance Pulldown with March  - 3 x weekly - 2 sets - 20 reps - Curator  - 3 x weekly - 2 sets - 1 min each arm carry time - Sitting Chest Press With Kettlebell  - 3 x weekly - 2 sets - 15 reps - Quadruped Cat Cow  - 1 x daily - 2 sets - 10 reps - Child's Pose with Sidebending  - 1 x daily - 2  sets - 30 hold - Quadruped Rock Back into [employer] Up  - 1 x daily - 1 sets - 10 reps - Prone Press Up  - 1 x daily - 2 sets - 10 reps - Standing Bent Over Single Arm Scapular Row with Table Support with PLB  -  3 x weekly - 2 sets - 15-20 reps - Single Arm Bent Over Shoulder Scaption with Dumbbell  - 3 x weekly - 2 sets - 15-20 reps - Sub-Occipital Cervical Stretch  - 1 x daily - 1 sets - 10 reps - 5 second hold - Thoracic Stretch on Foam Roll - Hands Clasped  - 1-2 sets - 10 reps - Snow Angels on Foam Roll  - 1 x daily - 2 sets - 10 reps - Prone Angels  - Seated Upper Trapezius Stretch  - 1-2 x daily - 2-3 reps - 60 seconds hold  HOME EXERCISE PROGRAM [[id number]] View at "[email or web address]" using code: [email or web address] Prayer Stretch -  Repeat 10 Repetitions, Hold 2 Seconds, Complete 2 Sets, Perform 3 Times a Day  HOME EXERCISE PROGRAM [[id number]] View at [email or web address] using code: [id number] Suboccipital Stretch and C1-C2 Mobilization -  Repeat 20 Repetitions, Hold 2 Seconds, Perform 3 Times a Day  HOME EXERCISE PROGRAM [[id number]] View at [email or web address] using code [email or web address] LAT PULLDOWN - SINGLE ARM  -  Repeat 15 Repetitions, Complete 3 Sets, Perform 1 Times a Day  HOME EXERCISE PROGRAM Created by Alleen Isle, PT, DPT May 5th, 2025 View at [email or web address] code [email or web address] Repeated Shoulder Extension MDT: Perform 10-12 reps every 2-3 hours Cervical Rotation SNAG: Repeat 10 Times Hold 3 Seconds each level 1-2x a day To be more specific with this, line up the edge of the linen at the level of your neck where the stiffness exists. or move along the spine about 1/2 inch lower each time (4-6 levels)  HOME EXERCISE PROGRAM [[id number]] View at [email or web address] code [id number] Retraction in Prone with Overpressure- McKenzie -  Repeat 10 Repetitions, Hold 1 Second(s), Perform 8 Times a Day OR Retraction Extension in Prone- McKenzie -  Repeat 10 Repetitions, Hold 1 Second(s), Perform 8 Times a Day    ASSESSMENT:  CLINICAL IMPRESSION: Continued with similar exercises today. Patient very motivated and energetic today and completed exercises more quickly than before. He had difficulty with  renegade row due to weakness in R shoulder protraction. Patient would benefit from continued management of limiting condition by skilled physical therapist to address remaining impairments and functional limitations to work towards stated goals and return to PLOF or maximal functional independence.    OBJECTIVE IMPAIRMENTS: decreased activity tolerance, decreased endurance, decreased knowledge of condition, decreased mobility, difficulty walking, decreased ROM, decreased strength, hypomobility, impaired perceived functional ability, increased muscle spasms, impaired flexibility, impaired UE functional use, postural dysfunction, and pain.   ACTIVITY LIMITATIONS: carrying, lifting, bending, sitting, standing, squatting, sleeping, transfers, bed mobility, bathing, dressing, hygiene/grooming, locomotion level, and caring for others  PARTICIPATION LIMITATIONS: meal prep, cleaning, laundry, interpersonal relationship, driving, shopping, community activity, yard work, and everyday activities and tasks, working on projects, working on [employer], walking for exercise, sleeping, doing as much as he wants to   PERSONAL FACTORS: Age, Past/current experiences, Time since onset of injury/illness/exacerbation, and 3+ comorbidities: bilateral shoulder surgery (RTC repair, R most recent 12/2020), bilateral TKA, subarachnoid hemorrhage 2017 with SAH coiling, HTN, hx of hepatitis C (cured), double hernia repair, plantar facial fibromatosis, chronic pain syndrome, former smoker, Chronic bilateral low back pain with bilateral sciatica;  Acute bursitis of right shoulder; Lumbar facet arthropathy; Lumbar degenerative disc disease; Localized osteoarthritis of shoulder regions, bilateral; Chronic SI joint pain; Chronic pain syndrome; Pain in joint of left shoulder; Abnormal gait; Anatomical narrow angle; Biomechanical lesion; Cataract nuclear; Derangement of posterior horn of medial meniscus; Edema; Glaucoma suspect of both eyes;  Hip pain; History of total knee arthroplasty; Localized, primary osteoarthritis; Hx of subarachnoid hemophage, Muscle weakness; Arthritis; Plantar fascial fibromatosis; Viral hepatitis C; Arthropathy of left wrist; Chronic pain of left heel; Intercostal neuralgia; and Closed fracture of rib of left side with routine healing,  has a past medical history of Arthritis, Hepatitis, Hypertension, Subarachnoid hemorrhage (HCC) (2017), and Wears hearing aid in both ears.  has a past surgical history that includes Brain surgery; Joint replacement (Bilateral); Shoulder arthroscopy with rotator cuff repair and open biceps tenodesis (Left, 09/08/2018); and Hernia repair (1963). Also had surgery on right shoulder are also affecting patient's functional outcome.   REHAB POTENTIAL: Good  CLINICAL DECISION MAKING: Evolving/moderate complexity  EVALUATION COMPLEXITY: Moderate   GOALS: Goals reviewed with patient? No  SHORT TERM GOALS: Target date: 03/20/2023  Patient will be independent with initial home exercise program for self-management of symptoms. Baseline: Initial HEP to be provided at visit 2 as appropriate (03/06/23); continued participation in HEP (04/18/23); Goal status: MET   LONG TERM GOALS: Target date: 05/29/2023. Target date updated to 08/28/2023 for all unmet goals on 06/05/2023.  Patient will be independent with a long-term home exercise program for self-management of symptoms.  Baseline: Initial HEP to be provided at visit 2 as appropriate (03/06/23); continued participation in HEP (04/18/23); not participating recently (06/05/2023);  Goal status: In progress  2.  Patient will demonstrate improved FOTO to equal or greater than 56 by visit #13 to demonstrate improvement in overall condition and self-reported functional ability.  Baseline: 48/100 (03/06/23); 47/100 (04/18/23); Goal status: Discontinued due to clinic discontinuing access to measure (06/05/2023)  3.  Patient will demonstrate full  thoracic AROM without provocation of thoracic spine pain and tightness to improve his ability to complete tasks such as walking, sweeping, and washing dishes with less difficulty. Baseline: Limitations with extension/flexion and concordant pulling sensation with lateral flexion (03/06/23); 46 degrees of flexion, 15 degrees of extension, continued limitations in ROM with later flexion but not painful with motion (04/18/23); continues with limitations and pain - see objective (06/05/2023);  Goal status: Ongoing  4.  Patient will demonstrate full cervical AROM without provocation of UT pain and thoracic spine tightness to improve ability to complete ADLs with less difficulty.  Baseline: Limited flexion and left side bending causing UT pain (03/06/23); 44 degrees and no pain with cervical flexion, 11 degrees of left side bending with tightness but no pain (04/18/23); continues with limitations and pain in neck (06/05/2023);  Goal status: In progress  5.  Patient will demonstrate improvement in Patient Specific Functional Scale (PSFS) of equal or greater than 3 points to reflect clinically significant improvement in patient's most valued functional activities. Baseline: To be tested at visit 2 as appropriate (03/06/23); 5/10 at visit #2 (03/13/2023); 7/10 at visit #10 (04/18/23); 6.3/10 (06/05/2023);  Goal status: In progress   PLAN:  PT FREQUENCY: 1-2x/week  PT DURATION: 8-12 weeks  PLANNED INTERVENTIONS: 97164- PT Re-evaluation, 97110-Therapeutic exercises, 97530- Therapeutic activity, 97112- Neuromuscular re-education, 97535- Self Care, 60454- Manual therapy, 97014- Electrical stimulation (unattended), Patient/Family education, Dry Needling, Joint mobilization, Spinal mobilization, Cryotherapy, and Moist heat.  PLAN FOR NEXT SESSION: update HEP as appropriate, progressive postural/UE/functional  strengthening, core strengthening, manual therapy as needed, education.   Carilyn Charles. Artemio Larry, PT, DPT 07/11/23,  7:20 PM  [facility] [address] [place], [place] [address] P: [phone] I F: [phone]

## 2023-07-17 ENCOUNTER — Ambulatory Visit: Admitting: Physical Therapy

## 2023-07-17 ENCOUNTER — Encounter: Payer: Self-pay | Admitting: Physical Therapy

## 2023-07-17 DIAGNOSIS — M546 Pain in thoracic spine: Secondary | ICD-10-CM | POA: Diagnosis not present

## 2023-07-17 DIAGNOSIS — M6281 Muscle weakness (generalized): Secondary | ICD-10-CM

## 2023-07-17 NOTE — Therapy (Signed)
 OUTPATIENT PHYSICAL THERAPY TREATMENT  Patient Name: Charles Mooney MRN: [id number] DOB:1955/05/20, 68 y.o., male Today's Date: 07/17/2023  END OF SESSION:  PT End of Session - 07/17/23 1742     Visit Number 29    Number of Visits 35    Date for PT Re-Evaluation 08/28/23    Authorization Type MEDICARE PART B reporting period from 06/12/2023    Progress Note Due on Visit 20    PT Start Time 1737    PT Stop Time 1815    PT Time Calculation (min) 38 min    Activity Tolerance Patient tolerated treatment well    Behavior During Therapy [facility] for tasks assessed/performed               Past Medical History:  Diagnosis Date   Arthritis    Hepatitis    c 2000   Hypertension    Subarachnoid hemorrhage (HCC) 2017   Wears hearing aid in both ears    Past Surgical History:  Procedure Laterality Date   BRAIN SURGERY     [facility] COILING 2017   HERNIA REPAIR  1963   double hernia repair   JOINT REPLACEMENT Bilateral    TKR  L-2012, R-2015   SHOULDER ARTHROSCOPY WITH ROTATOR CUFF REPAIR AND OPEN BICEPS TENODESIS Left 09/08/2018   Procedure: LEFT SHOULDER ARTHROSCOPY,SUBSACAPULARIS REPAIR, SUBACROMIAL DECOMP, DISTAL CLAVICLE EXCISION,BICEP TENODESIS, MINI OPEN REGENTEN PATCH APPLICATION;  Surgeon: Lorri Rota, MD;  Location: [facility] ORS;  Service: Orthopedics;  Laterality: Left;   Patient Active Problem List   Diagnosis Date Noted   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   Closed fracture of rib of left side with routine healing 09/04/2022   Chronic pain of left heel 08/31/2021   Arthropathy of left wrist 10/12/2020   Viral hepatitis C 11/02/2019   Lumbar facet arthropathy 04/27/2019   Lumbar degenerative disc disease 04/27/2019   Localized osteoarthritis of shoulder regions, bilateral 04/27/2019   Encounter for long-term opiate analgesic use 04/27/2019   Chronic SI joint pain 04/27/2019   Chronic pain syndrome 04/27/2019   Pain in joint of left shoulder 06/03/2018   Abnormal gait  06/18/2017   Anatomical narrow angle 06/18/2017   Derangement of posterior horn of medial meniscus 06/18/2017   Edema 06/18/2017   Hip pain 06/18/2017   History of total knee arthroplasty 06/18/2017   Localized, primary osteoarthritis 06/18/2017   Muscle weakness 06/18/2017   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   Acute bursitis of right shoulder 06/14/2017   Nonallopathic lesion of sacral region 06/14/2017   Nonallopathic lesion of thoracic region 06/14/2017   Nonallopathic lesion of lumbosacral region 06/14/2017   Biomechanical lesion 06/14/2017   Chronic bilateral low back pain with bilateral sciatica 05/23/2017   Arthritis 04/19/2013   Cataract nuclear 06/16/2012   Glaucoma suspect of both eyes 06/16/2012    PCP: Rex Castor, MD  REFERRING PROVIDER: Cephus Collin, MD  REFERRING DIAG: myofascial pain syndrome of thoracic spine  Rationale for Evaluation and Treatment: Rehabilitation  THERAPY DIAG:  Pain in thoracic spine  Muscle weakness (generalized)  ONSET DATE: October 2024 (approximately 3 months after rib fracture in July 2024)  SUBJECTIVE:  PERTINENT HISTORY:  Patient is a 68 y.o. male who presents to outpatient physical therapy with a referral for medical diagnosis of myofascial pain syndrome of thoracic spine. This patient's chief complaints consist of chronic right sided thoracic spine pain, right shoulder pain, and occasional paresthesia to the right UE, leading to the following functional deficits: limits him with everyday activities/tasks to some amount, working on projects, working on the computer, walking for exercise, sleeping, doing as much as he wants to. He never did get into a routine with the workout program.   Relevant past medical history and comorbidities  include bilateral shoulder surgery (RTC repair, R most recent 12/2020) and subarachnoid hemorrhage in 2017. Patient denies hx of cancer, seizures, unexplained weight loss, unexplained changes in bowel or bladder problems, unexplained stumbling or dropping things, back surgery.   Exercise history: he used to do bench presses, flies, bent over rows, lat pull in front and behind head, curls, triceps extensions overhead. He worked out at home 2-3/x a week but he always struggled to be consistent. 45-60 minutes. He stopped approx 10 years ago when he started building his current house. Then he had a stroke and then it was all he could do to work. He did not have any extra energy or willpower at that point. He is currently doing all of this for his grandkids.   Sees someone for talk therapy 1x a month (50 minutes) with the same person. He is limited by the fact he has to pay out of pocket. He takes celexa and his GP switched him to Wellbutrin. He did not like this so he went back to celexa. He has not tried any other medications for that. He had hepatitis C in the early 90s. He was put on interferon and rivovinan. That caused some really serious depression and was put on something then but cannot remember what it was (it might have been amitriptyline, and it was wild). He feels his depression got fully blown and noticeable about 3 years ago, around the time he got his shoulder done. At that time he made a decision to move at that time and it really triggered it because he built the house he is in now and he had planned to retire there. He has not moved yet, but he is currently packing. He also feels like his depression is also related to not being in shape. He thinks if he could get his pain and physical conditioning under control, his depression would be much better.    SUBJECTIVE STATEMENT: Patient states his neck and scapula are a bit worse but his shoulder is a lot worse after shaking a can of adhesive while  installing drywall this weekend. His shoulder instantly hurt more and was a little better yesterday. He had someone else finish shaking the adhesive. His neck, shoulder, and scapular region were feeling pretty good before he did the drywall project. He does not have any further big projects coming up, but he has a lot of packing and cleaning to do. They do not have a move date yet. He has been doing some HEP.   PAIN: Are you having pain? Yes;  NPRS: 4/10 neck, 6/10 R shoulder, 4/10 R periscapular region     FUNCTIONAL LIMITATIONS: limits him with everyday activities and tasks, working on projects, working on the computer, walking for exercise, sleeping, doing as much as he wants to  [employer]: working in the shop, building and fixing things  PRECAUTIONS: None   WEIGHT  BEARING RESTRICTIONS: No   PLOF: Independent  PATIENT GOALS: to get rid of the pain, to get on an exercise and stretching regimen for his overall health, sleep with less difficulty from pain   Next MD appointment: June or July with Dr. Rhesa Celeste  OBJECTIVE    1RM TESTING Bent Over Row with contralateral UE braced (last tested 03/27/2023): 1 RM for Right Arm: 75.8  1 RM for Left Arm: 80.2  Bent Over Shoulder Scaption ("Y"):  (last tested 03/27/2023): 1 RM for Right Arm: 8.9  1 RM for Left Arm: 9.1   TREATMENT                                                                                                                           Therapeutic exercise: therapeutic exercises that incorporate ONE parameter at one or more areas of the body to centralize symptoms, develop strength and endurance, range of motion, and flexibility required for successful completion of functional activities.  UBE L6, 3 min forward, 3 min backward.   Seated cervical spine rotational SNAG with strap 2x12 each side  Seated cervical retractions into blue ball on wall for resistance:  3x12  OMEGA superset:   Standing scap retractions: 3x15, 35#    Lat pull down: 3x15, 55#   Standing B shoulder ER with scap retraction 1x10 with blue TB  Painful Lacks L ER ROM  Seated R shoulder ER with elbow propped on knee 1x5 with 5#DB  Discontinued due to painful and less smooth part of rotation.   Standing shoulder IR at neutral (nothing under elbow, but pulling fist to mid trunk).  3x15 each side with 10#cable except last set on R 10 due to fatigue.  Trial of blue TB too easy  Sidelying L shoulder ER Attempt with 5#DB unsuccessful (painful) 1x5 AROM not painful 1x10 with 2#DB painful at anterior shoulder (discontinued) 1x5 AROM now painful Discontinued and educated to drop ER from HEP for 1 week  Therapeutic activities: dynamic therapeutic activities incorporating MULTIPLE parameters or areas of the body designed to achieve improved functional performance.  Hands elevated plinth with alternating rowing  ROM 3x6/side on 22.5 inch plinth after trying on other surfaces Reported feeling of instability in R shoulder when supporting.   Mechanical Traction: to improve cervical spine and thoracic pain, and help decrease muscle tension.  Type: Cervical Min (lb): 0 Max (lb): 30-35  Hold time: 30 seconds Rest time: 10 seconds Pull angle: ~20 degrees Time: 15 plus set up and take down Result:  good tolerance during, no complaints after, decreased neck pain.   Pt required multimodal cuing for proper technique and to facilitate improved neuromuscular control, strength, range of motion, and functional ability resulting in improved performance and form.   PATIENT EDUCATION:  Education details: Education on diagnosis, prognosis, POC, anatomy and physiology of current condition. Mechanical traction. Home traction.  Person educated: Patient Education method: Explanation, Demonstration Education comprehension: verbalized understanding and needs further education  HOME EXERCISE PROGRAM: Access Code: [email or web address] URL:  [email or web address] Date: 05/17/2023 Prepared by: Alleen Isle  Exercises - Dumbbell Squat at Shoulders  - 1 x daily - 2-3 x weekly - 2 sets - 20 reps - Seated Thoracic Lumbar Extension  - 1 x daily - 1 sets - 20 reps - 5 seconds hold - Standing Row with Anchored Resistance  - 3 x weekly - 2 sets - 20 reps - Resistance Pulldown with March  - 3 x weekly - 2 sets - 20 reps - Curator  - 3 x weekly - 2 sets - 1 min each arm carry time - Sitting Chest Press With Kettlebell  - 3 x weekly - 2 sets - 15 reps - Quadruped Cat Cow  - 1 x daily - 2 sets - 10 reps - Child's Pose with Sidebending  - 1 x daily - 2 sets - 30 hold - Quadruped Rock Back into [employer] Up  - 1 x daily - 1 sets - 10 reps - Prone Press Up  - 1 x daily - 2 sets - 10 reps - Standing Bent Over Single Arm Scapular Row with Table Support with PLB  - 3 x weekly - 2 sets - 15-20 reps - Single Arm Bent Over Shoulder Scaption with Dumbbell  - 3 x weekly - 2 sets - 15-20 reps - Sub-Occipital Cervical Stretch  - 1 x daily - 1 sets - 10 reps - 5 second hold - Thoracic Stretch on Foam Roll - Hands Clasped  - 1-2 sets - 10 reps - Snow Angels on Foam Roll  - 1 x daily - 2 sets - 10 reps - Prone Angels  - Seated Upper Trapezius Stretch  - 1-2 x daily - 2-3 reps - 60 seconds hold  HOME EXERCISE PROGRAM [[id number]] View at "[email or web address]" using code: [email or web address] Prayer Stretch -  Repeat 10 Repetitions, Hold 2 Seconds, Complete 2 Sets, Perform 3 Times a Day  HOME EXERCISE PROGRAM [[id number]] View at [email or web address] using code: [id number] Suboccipital Stretch and C1-C2 Mobilization -  Repeat 20 Repetitions, Hold 2 Seconds, Perform 3 Times a Day  HOME EXERCISE PROGRAM [[id number]] View at [email or web address] using code [email or web address] LAT PULLDOWN - SINGLE ARM  -  Repeat 15 Repetitions, Complete 3 Sets, Perform 1 Times a Day  HOME EXERCISE PROGRAM Created by Alleen Isle, PT, DPT May 5th, 2025 View at  [email or web address] code [email or web address] Repeated Shoulder Extension MDT: Perform 10-12 reps every 2-3 hours Cervical Rotation SNAG: Repeat 10 Times Hold 3 Seconds each level 1-2x a day To be more specific with this, line up the edge of the linen at the level of your neck where the stiffness exists. or move along the spine about 1/2 inch lower each time (4-6 levels)  HOME EXERCISE PROGRAM [[id number]] View at [email or web address] code [id number] Retraction in Prone with Overpressure- McKenzie -  Repeat 10 Repetitions, Hold 1 Second(s), Perform 8 Times a Day OR Retraction Extension in Prone- McKenzie -  Repeat 10 Repetitions, Hold 1 Second(s), Perform 8 Times a Day    ASSESSMENT:  CLINICAL IMPRESSION: Patient with elevated pain today after putting up drywall this weekend and shaking a can of adhesive. Patient with weak and painful R ER so advised him to discontinue ER exercise for 1 week before attempting to re-introduce it. Continued with strengthening in other directions that was previously well tolerated and modified any that were not to continue building capacity. Patient reported slight increase in R shoulder pain by end  of session and decreased neck pain after mechanical traction. Patient focused and determined today. Patient would benefit from continued management of limiting condition by skilled physical therapist to address remaining impairments and functional limitations to work towards stated goals and return to PLOF or maximal functional independence.   OBJECTIVE IMPAIRMENTS: decreased activity tolerance, decreased endurance, decreased knowledge of condition, decreased mobility, difficulty walking, decreased ROM, decreased strength, hypomobility, impaired perceived functional ability, increased muscle spasms, impaired flexibility, impaired UE functional use, postural dysfunction, and pain.   ACTIVITY LIMITATIONS: carrying, lifting, bending, sitting, standing, squatting, sleeping, transfers, bed  mobility, bathing, dressing, hygiene/grooming, locomotion level, and caring for others  PARTICIPATION LIMITATIONS: meal prep, cleaning, laundry, interpersonal relationship, driving, shopping, community activity, yard work, and everyday activities and tasks, working on projects, working on [employer], walking for exercise, sleeping, doing as much as he wants to   PERSONAL FACTORS: Age, Past/current experiences, Time since onset of injury/illness/exacerbation, and 3+ comorbidities: bilateral shoulder surgery (RTC repair, R most recent 12/2020), bilateral TKA, subarachnoid hemorrhage 2017 with SAH coiling, HTN, hx of hepatitis C (cured), double hernia repair, plantar facial fibromatosis, chronic pain syndrome, former smoker, Chronic bilateral low back pain with bilateral sciatica; Acute bursitis of right shoulder; Lumbar facet arthropathy; Lumbar degenerative disc disease; Localized osteoarthritis of shoulder regions, bilateral; Chronic SI joint pain; Chronic pain syndrome; Pain in joint of left shoulder; Abnormal gait; Anatomical narrow angle; Biomechanical lesion; Cataract nuclear; Derangement of posterior horn of medial meniscus; Edema; Glaucoma suspect of both eyes; Hip pain; History of total knee arthroplasty; Localized, primary osteoarthritis; Hx of subarachnoid hemophage, Muscle weakness; Arthritis; Plantar fascial fibromatosis; Viral hepatitis C; Arthropathy of left wrist; Chronic pain of left heel; Intercostal neuralgia; and Closed fracture of rib of left side with routine healing,  has a past medical history of Arthritis, Hepatitis, Hypertension, Subarachnoid hemorrhage (HCC) (2017), and Wears hearing aid in both ears.  has a past surgical history that includes Brain surgery; Joint replacement (Bilateral); Shoulder arthroscopy with rotator cuff repair and open biceps tenodesis (Left, 09/08/2018); and Hernia repair (1963). Also had surgery on right shoulder are also affecting patient's functional  outcome.   REHAB POTENTIAL: Good   CLINICAL DECISION MAKING: Evolving/moderate complexity  EVALUATION COMPLEXITY: Moderate   GOALS: Goals reviewed with patient? No  SHORT TERM GOALS: Target date: 03/20/2023  Patient will be independent with initial home exercise program for self-management of symptoms. Baseline: Initial HEP to be provided at visit 2 as appropriate (03/06/23); continued participation in HEP (04/18/23); Goal status: MET   LONG TERM GOALS: Target date: 05/29/2023. Target date updated to 08/28/2023 for all unmet goals on 06/05/2023.  Patient will be independent with a long-term home exercise program for self-management of symptoms.  Baseline: Initial HEP to be provided at visit 2 as appropriate (03/06/23); continued participation in HEP (04/18/23); not participating recently (06/05/2023);  Goal status: In progress  2.  Patient will demonstrate improved FOTO to equal or greater than 56 by visit #13 to demonstrate improvement in overall condition and self-reported functional ability.  Baseline: 48/100 (03/06/23); 47/100 (04/18/23); Goal status: Discontinued due to clinic discontinuing access to measure (06/05/2023)  3.  Patient will demonstrate full thoracic AROM without provocation of thoracic spine pain and tightness to improve his ability to complete tasks such as walking, sweeping, and washing dishes with less difficulty. Baseline: Limitations with extension/flexion and concordant pulling sensation with lateral flexion (03/06/23); 46 degrees of flexion, 15 degrees of extension, continued limitations in ROM with later flexion but not painful  with motion (04/18/23); continues with limitations and pain - see objective (06/05/2023);  Goal status: Ongoing  4.  Patient will demonstrate full cervical AROM without provocation of UT pain and thoracic spine tightness to improve ability to complete ADLs with less difficulty.  Baseline: Limited flexion and left side bending causing UT pain  (03/06/23); 44 degrees and no pain with cervical flexion, 11 degrees of left side bending with tightness but no pain (04/18/23); continues with limitations and pain in neck (06/05/2023);  Goal status: In progress  5.  Patient will demonstrate improvement in Patient Specific Functional Scale (PSFS) of equal or greater than 3 points to reflect clinically significant improvement in patient's most valued functional activities. Baseline: To be tested at visit 2 as appropriate (03/06/23); 5/10 at visit #2 (03/13/2023); 7/10 at visit #10 (04/18/23); 6.3/10 (06/05/2023);  Goal status: In progress   PLAN:  PT FREQUENCY: 1-2x/week  PT DURATION: 8-12 weeks  PLANNED INTERVENTIONS: 97164- PT Re-evaluation, 97110-Therapeutic exercises, 97530- Therapeutic activity, 97112- Neuromuscular re-education, 97535- Self Care, 16109- Manual therapy, 97014- Electrical stimulation (unattended), Patient/Family education, Dry Needling, Joint mobilization, Spinal mobilization, Cryotherapy, and Moist heat.  PLAN FOR NEXT SESSION: update HEP as appropriate, progressive postural/UE/functional strengthening, core strengthening, manual therapy as needed, education.   Carilyn Charles. Artemio Larry, PT, DPT 07/17/23, 6:45 PM  [facility] [address] [place], [place] [address] P: [phone] I F: [phone]

## 2023-07-23 ENCOUNTER — Encounter: Payer: Self-pay | Admitting: Physical Therapy

## 2023-07-23 ENCOUNTER — Ambulatory Visit: Attending: Student in an Organized Health Care Education/Training Program | Admitting: Physical Therapy

## 2023-07-23 ENCOUNTER — Other Ambulatory Visit: Payer: Self-pay | Admitting: Student in an Organized Health Care Education/Training Program

## 2023-07-23 DIAGNOSIS — M542 Cervicalgia: Secondary | ICD-10-CM | POA: Insufficient documentation

## 2023-07-23 DIAGNOSIS — M546 Pain in thoracic spine: Secondary | ICD-10-CM | POA: Insufficient documentation

## 2023-07-23 DIAGNOSIS — M6281 Muscle weakness (generalized): Secondary | ICD-10-CM | POA: Diagnosis present

## 2023-07-23 DIAGNOSIS — M25511 Pain in right shoulder: Secondary | ICD-10-CM | POA: Diagnosis present

## 2023-07-23 DIAGNOSIS — M19011 Primary osteoarthritis, right shoulder: Secondary | ICD-10-CM

## 2023-07-23 NOTE — Therapy (Signed)
 OUTPATIENT PHYSICAL THERAPY EVALUATION   Patient Name: Charles Mooney MRN: [id number] DOB:[birth year], 68 y.o., male Today's Date: 07/23/2023  END OF SESSION:  PT End of Session - 07/23/23 1238     Visit Number 1    Number of Visits 17    Date for PT Re-Evaluation 10/15/23    Authorization Type MEDICARE PART B reporting period from 07/23/2023    Progress Note Due on Visit 10    PT Start Time 1033    PT Stop Time 1130    PT Time Calculation (min) 57 min    Activity Tolerance Patient tolerated treatment well    Behavior During Therapy [facility] for tasks assessed/performed                Past Medical History:  Diagnosis Date   Arthritis    Hepatitis    c 2000   Hypertension    Subarachnoid hemorrhage (HCC) 2017   Wears hearing aid in both ears    Past Surgical History:  Procedure Laterality Date   BRAIN SURGERY     [facility] COILING 2017   HERNIA REPAIR  1963   double hernia repair   JOINT REPLACEMENT Bilateral    TKR  L-2012, R-2015   SHOULDER ARTHROSCOPY WITH ROTATOR CUFF REPAIR AND OPEN BICEPS TENODESIS Left 09/08/2018   Procedure: LEFT SHOULDER ARTHROSCOPY,SUBSACAPULARIS REPAIR, SUBACROMIAL DECOMP, DISTAL CLAVICLE EXCISION,BICEP TENODESIS, MINI OPEN REGENTEN PATCH APPLICATION;  Surgeon: Lorri Rota, MD;  Location: [facility] ORS;  Service: Orthopedics;  Laterality: Left;   Patient Active Problem List   Diagnosis Date Noted   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   Closed fracture of rib of left side with routine healing 09/04/2022   Chronic pain of left heel 08/31/2021   Arthropathy of left wrist 10/12/2020   Viral hepatitis C 11/02/2019   Lumbar facet arthropathy 04/27/2019   Lumbar degenerative disc disease 04/27/2019   Localized osteoarthritis of shoulder regions, bilateral 04/27/2019   Encounter for long-term opiate analgesic use 04/27/2019   Chronic SI joint pain 04/27/2019   Chronic pain syndrome 04/27/2019   Pain in joint of left shoulder 06/03/2018   Abnormal gait  06/18/2017   Anatomical narrow angle 06/18/2017   Derangement of posterior horn of medial meniscus 06/18/2017   Edema 06/18/2017   Hip pain 06/18/2017   History of total knee arthroplasty 06/18/2017   Localized, primary osteoarthritis 06/18/2017   Muscle weakness 06/18/2017   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   Acute bursitis of right shoulder 06/14/2017   Nonallopathic lesion of sacral region 06/14/2017   Nonallopathic lesion of thoracic region 06/14/2017   Nonallopathic lesion of lumbosacral region 06/14/2017   Biomechanical lesion 06/14/2017   Chronic bilateral low back pain with bilateral sciatica 05/23/2017   Arthritis 04/19/2013   Cataract nuclear 06/16/2012   Glaucoma suspect of both eyes 06/16/2012    PCP: Rex Castor, MD  REFERRING PROVIDER: Cephus Collin, MD  REFERRING DIAG: cervicalgia, arthritis of right shoulder region, myofascial pain syndrome of thoracic spine  Rationale for Evaluation and Treatment: Rehabilitation  THERAPY DIAG:  Right shoulder pain, unspecified chronicity  Cervicalgia  Muscle weakness (generalized)  Pain in thoracic spine  ONSET DATE: Neck pain and shoulder pain worsened since January 2025, October 2024 for right scapular pain (approximately 3 months after rib fracture in July 2024)  SUBJECTIVE:  PERTINENT HISTORY:  Patient is a 68 y.o. male who presents to outpatient physical therapy with a referral for medical diagnosis of cervicalgia, arthritis of right shoulder region, myofascial pain syndrome of thoracic spine. This patient's chief complaints consist of acute on chronic R shoulder pain and popping, neck pain, and R shoulder pain leading to the following functional deficits: limits him with everyday activities/tasks to some amount, working on  projects, working on the computer, walking for exercise, sleeping, working in his shop, house maintenance and projects, doing as much as he wants to, regular exercise.   Relevant past medical history and comorbidities include bilateral shoulder surgery (RTC repair, R most recent 12/2020) and subarachnoid hemorrhage in 2017, depression. Patient denies hx of cancer, seizures, unexplained weight loss, unexplained changes in bowel or bladder problems, unexplained stumbling or dropping things, back surgery.   Exercise history: he used to do bench presses, flies, bent over rows, lat pull in front and behind head, curls, triceps extensions overhead. He worked out at home 2-3/x a week but he always struggled to be consistent. 45-60 minutes. He stopped approx 10 years ago when he started building his current house. Then he had a stroke and then it was all he could do to work. He did not have any extra energy or willpower at that point. He is currently doing all of this for his grandkids.   He struggles with depression, Sees someone for talk therapy 1x a month (50 minutes) with the same person. He is limited by the fact he has to pay out of pocket. He takes celexa and his GP switched him to Wellbutrin. He did not like this so he went back to celexa. He has not tried any other medications for that. He had hepatitis C in the early 90s. He was put on interferon and rivovinan. That caused some really serious depression and was put on something then but cannot remember what it was (it might have been amitriptyline, and it was wild). He feels his depression got fully blown and noticeable about 3 years ago, around the time he got his shoulder done. At that time he made a decision to move at that time and it really triggered it because he built the house he is in now and he had planned to retire there. He has not moved yet, but he is currently packing. He also feels like his depression is also related to not being in shape. He  thinks if he could get his pain and physical conditioning under control, his depression would be much better.   Patient's neck started bothering him a lot when PT prescribed a generalized fitness program during his rehab for shoulder pain to help him meet his goals of exercising more regularly and improving his strength. His R shoulder has been getting worse as he has been doing projects to work on house maintenance getting it ready to sell which have involved heavy tasks for his shoulder such as hanging drywall. On 07/15/23 he was shaking a can of adhesive with his R shoulder and had sudden pain in the anterior shoulder which stopped him in his tracks so he had to bend over. This pain subsided over 5 min but took at least two days to get back to baseline (gradually increasing pain over the R anterior shoulder).    SUBJECTIVE STATEMENT: Patient is known to this PT and clinic. He has been completing PT for R periscapular pain and PT has worked on addressing some of the R  shoulder and neck pain. His periscapular pain is better but not gone but the neck and R shoulder pain has taken a more primary role in his functional limitations and he now has a PT order to address these conditions too, prompting a new evaluation.   Patient states states he is not feeling too bad. He thinks "we have turned the corner" on his neck. It has felt pretty good since last PT session. It still feels stiff and he would like to start strengthening it. The right periscapular region is staying pretty constant at a 3/10, not flaring up like it was and he thinks it will go away when the neck and shoulder gets worked out. The right shoulder is still painful and still flaring but has improved some since last PT session. It is changing a little bit. What he is getting now is trending more towards feeling like a "nerve pinch" when moving it certain directions. It is a sharp shooting pain, then it subsides. It has done this several times over  the last few days without him doing much, and certainly not heavy, work on it. He is thinking that he never got the shoulder strong after RTC surgery in 2022. He did all the PT to get it moving, but it left him with a weak/loose joint that has starting to turn into the pain he is having now. He thinks as we are strengthening it now it is moving things back into place, but in so doing it is popping a lot and he thinks this is tendons or muscles or possibly the nerve that is running over roughness in the bones that is there because it is not aligned properly. He thinks if he gets strengthening done it will solve the problem. This will take a while because it hurts really bad. The popping does not hurt, but when he has pain it goes up quickly and then resolves in 5-10 minutes. The pain happens when he abducts his arm with the arm out (demonstrates abduction to 90 degrees with IR/ER) and there is any resistance or when he moves into horizontal abduction at around 90 degrees. Describes stabilizing a case of beer when his arm is elevated in scaption, and reaching across body.The pain is mostly in the anterior shoulder.     PAIN: Are you having pain? Yes;  NPRS: 2/10 neck, 4/10 R shoulder while doing RPE but will jump to 7 "in a heart beat", 3/10 R periscapular region     FUNCTIONAL LIMITATIONS:limits him with everyday activities/tasks to some amount, working on projects, working on the computer, walking for exercise, sleeping, working in his shop, house maintenance and projects, doing as much as he wants to, regular exercise  LEISURE: working in the shop, building and fixing things  PRECAUTIONS: None   WEIGHT BEARING RESTRICTIONS: No   PLOF: Independent  PATIENT GOALS: to get rid of the pain, to get on an exercise and stretching regimen for his overall health, sleep with less difficulty from pain.  Next MD appointment: June or July with Dr. Rhesa Celeste  OBJECTIVE   PATIENT SPECIFIC FUNCTIONAL SCALE  (for R scapula only)            1. Exercise: 8            2. Building/Using Tools: 8            3. Walking: 10 4. Sleeping: 9            Average: 9/10   SPINE  MOTION CERVICAL SPINE AROM *Indicates pain Flexion: 55 Extension: 35 slight pain/stretch in right base of neck Side Flexion:       R 15       L  12  Rotation:  R 68  L 58   SEATED THORACIC SPINE AROM *Indicates pain Flexion: 45 deg  Extension: 15 deg  Side Flexion:       R 43 deg ipsilateral lower rib pain, contralateral pain/tightness lumbar level      L 55 deg Rotation:  R 48 deg lower back stiffness L 64 deg lower back stiffness  PERIPHERAL JOINT MOTION (in degrees)  ACTIVE RANGE OF MOTION (AROM) *Indicates pain 07/23/23 Date Date  Joint/Motion R/L R/L R/L  Shoulder     Flexion 152*/ / /  Extension 50/ / /  Abduction  148/ / /  External rotation 90/ / /  Internal rotation L2/ / /  Comments:   PASSIVE RANGE OF MOTION (PROM) *Indicates pain 07/23/23 Date Date  Joint/Motion R/L R/L R/L  Shoulder     Flexion 160*/ / /  Extension 82/ / /  Abduction  135*/ / /  External rotation 91*/ / /  Internal rotation 35/ / /  Comments:    MUSCLE PERFORMANCE (MMT):  *Indicates pain 07/23/23 Date  Joint/Motion R/L R/L  Shoulder      Flexion 3+*/5 /  Abduction (C5) 3+*/5 /  External rotation 3*/5 /  Internal rotation 5/5 /  Extension 5/5 /  Periscapular      Serratus A.  5/5 /  Middle Trap 5/5 /  Lower Trap 5/5 /  Elbow      Flexion (C6) / /  Extension (C7) / /  Wrist      Flexion (C7) / /  Extension (C6) / /  Ulnar deviation (C8) / /  Hand      Thumb extension (C8) / /  Finger adduction (T1) / /  Comments:  07/23/2023: cramp in R low back when testing R and L lower and mid traps.    ACCESSORY MOTION R shoulder with increased motion throughout compared to left and more anterior translation of humeral head.     SPECIAL TESTS:  SHOULDER SPECIAL TESTS: Impingement tests: Hawkins/Kennedy impingement  test: positive  SLAP lesions: Crank test: positive , Clunk test: positive , and O'Brien's test: positive Instability tests: Load and shift test: positive , Jobes relocation test: positive , and Sulcus sign: positive  Rotator cuff assessment: Infraspinatus test: positive  Biceps assessment: Yergason's test: negative and Speed's test: negative   TREATMENT                                                                                                                            PATIENT EDUCATION:  Education details: Exercise purpose/form. Self management techniques. Education on diagnosis, prognosis, POC, anatomy and physiology of current condition. Education on HEP including handout. Person educated: Patient Education method: Explanation, Demonstration Education comprehension: verbalized understanding  and needs further education  HOME EXERCISE PROGRAM:  From previous POC, to be updated:   Access Code: [email or web address] URL: [email or web address] Date: 05/17/2023 Prepared by: Alleen Isle  Exercises - Dumbbell Squat at Shoulders  - 1 x daily - 2-3 x weekly - 2 sets - 20 reps - Seated Thoracic Lumbar Extension  - 1 x daily - 1 sets - 20 reps - 5 seconds hold - Standing Row with Anchored Resistance  - 3 x weekly - 2 sets - 20 reps - Resistance Pulldown with March  - 3 x weekly - 2 sets - 20 reps - Curator  - 3 x weekly - 2 sets - 1 min each arm carry time - Sitting Chest Press With Kettlebell  - 3 x weekly - 2 sets - 15 reps - Quadruped Cat Cow  - 1 x daily - 2 sets - 10 reps - Child's Pose with Sidebending  - 1 x daily - 2 sets - 30 hold - Quadruped Rock Back into [employer] Up  - 1 x daily - 1 sets - 10 reps - Prone Press Up  - 1 x daily - 2 sets - 10 reps - Standing Bent Over Single Arm Scapular Row with Table Support with PLB  - 3 x weekly - 2 sets - 15-20 reps - Single Arm Bent Over Shoulder Scaption with Dumbbell  - 3 x weekly - 2 sets - 15-20 reps -  Sub-Occipital Cervical Stretch  - 1 x daily - 1 sets - 10 reps - 5 second hold - Thoracic Stretch on Foam Roll - Hands Clasped  - 1-2 sets - 10 reps - Snow Angels on Foam Roll  - 1 x daily - 2 sets - 10 reps - Prone Angels  - Seated Upper Trapezius Stretch  - 1-2 x daily - 2-3 reps - 60 seconds hold  HOME EXERCISE PROGRAM [[id number]] View at "[email or web address]" using code: [email or web address] Prayer Stretch -  Repeat 10 Repetitions, Hold 2 Seconds, Complete 2 Sets, Perform 3 Times a Day  HOME EXERCISE PROGRAM [[id number]] View at [email or web address] using code: [id number] Suboccipital Stretch and C1-C2 Mobilization -  Repeat 20 Repetitions, Hold 2 Seconds, Perform 3 Times a Day  HOME EXERCISE PROGRAM [[id number]] View at [email or web address] using code [email or web address] LAT PULLDOWN - SINGLE ARM  -  Repeat 15 Repetitions, Complete 3 Sets, Perform 1 Times a Day  HOME EXERCISE PROGRAM Created by Alleen Isle, PT, DPT May 5th, 2025 View at [email or web address] code [email or web address] Repeated Shoulder Extension MDT: Perform 10-12 reps every 2-3 hours Cervical Rotation SNAG: Repeat 10 Times Hold 3 Seconds each level 1-2x a day To be more specific with this, line up the edge of the linen at the level of your neck where the stiffness exists. or move along the spine about 1/2 inch lower each time (4-6 levels)  HOME EXERCISE PROGRAM [[id number]] View at [email or web address] code [id number] Retraction in Prone with Overpressure- McKenzie -  Repeat 10 Repetitions, Hold 1 Second(s), Perform 8 Times a Day OR Retraction Extension in Prone- McKenzie -  Repeat 10 Repetitions, Hold 1 Second(s), Perform 8 Times a Day    ASSESSMENT:  CLINICAL IMPRESSION: Patient is a 68 y.o. male referred to outpatient physical therapy with a medical diagnosis of  cervicalgia, arthritis of right shoulder region, myofascial pain syndrome of thoracic spine who presents with signs and symptoms consistent with cervicalgia possibly radiating to R  periscapular region, acute on chronic R shoulder pain s/p RTC repair in 2022. He  has increased joint play and painful popping consistent with SLAP lesion as well as weak and painful ER and abduction suggesting rotator cuff related pain in the R shoulder. Patient presents with significant pain, joint stability, ROM, joint stiffness, muscle performance (strength/power/endurance), activity tolerance, and knowledge impairments that are limiting ability to complete everyday activities/tasks to some amount, working on projects, working on the computer, walking for exercise, sleeping, working in his shop, house maintenance and projects, doing as much as he wants to, regular exercise without difficulty. Patient will benefit from skilled physical therapy intervention to address current body structure impairments and activity limitations to improve function and work towards goals set in current POC in order to return to prior level of function or maximal functional improvement.    OBJECTIVE IMPAIRMENTS: decreased activity tolerance, decreased endurance, decreased knowledge of condition, decreased mobility, joint stiffness, joint instability, decreased ROM, decreased strength, hypomobility, impaired perceived functional ability, increased muscle spasms, impaired flexibility, impaired UE functional use, postural dysfunction, and pain.   ACTIVITY LIMITATIONS: carrying, lifting, bending, sitting, standing, squatting, sleeping, transfers, bed mobility, bathing, dressing, hygiene/grooming, reaching overhead, stabilizing with B UE, locomotion level, and caring for others  PARTICIPATION LIMITATIONS: meal prep, cleaning, laundry, interpersonal relationship, driving, shopping, community activity, yard work, and  Advertising account executive, working on projects, working on [employer], walking for exercise, sleeping, working in his shop, house maintenance and projects, doing as much as he wants to, regular exercise  PERSONAL  FACTORS: Age, Past/current experiences, Time since onset of injury/illness/exacerbation,behavior,  and 3+ comorbidities: depression, bilateral shoulder surgery (RTC repair, R most recent 12/2020), bilateral TKA, subarachnoid hemorrhage 2017 with SAH coiling, HTN, hx of hepatitis C (cured), double hernia repair, plantar facial fibromatosis, chronic pain syndrome, former smoker, Chronic bilateral low back pain with bilateral sciatica; Acute bursitis of right shoulder; Lumbar facet arthropathy; Lumbar degenerative disc disease; Localized osteoarthritis of shoulder regions, bilateral; Chronic SI joint pain; Chronic pain syndrome; Pain in joint of left shoulder; Abnormal gait; Anatomical narrow angle; Biomechanical lesion; Cataract nuclear; Derangement of posterior horn of medial meniscus; Edema; Glaucoma suspect of both eyes; Hip pain; History of total knee arthroplasty; Localized, primary osteoarthritis; Hx of subarachnoid hemophage, Muscle weakness; Arthritis; Plantar fascial fibromatosis; Viral hepatitis C; Arthropathy of left wrist; Chronic pain of left heel; Intercostal neuralgia; and Closed fracture of rib of left side with routine healing,  has a past medical history of Arthritis, Hepatitis, Hypertension, Subarachnoid hemorrhage (HCC) (2017), and Wears hearing aid in both ears.  has a past surgical history that includes Brain surgery; Joint replacement (Bilateral); Shoulder arthroscopy with rotator cuff repair and open biceps tenodesis (Left, 09/08/2018); and Hernia repair (1963). Also had surgery on right shoulder are also affecting patient's functional outcome.   REHAB POTENTIAL: Good   CLINICAL DECISION MAKING: Evolving/moderate complexity  EVALUATION COMPLEXITY: Moderate   GOALS: Goals reviewed with patient? No  SHORT TERM GOALS: Target date: 08/06/2023  Patient will be independent with initial home exercise program for self-management of symptoms. Baseline: Continue with HEP from last episode  of care (07/23/23); Goal status: INITIAL  LONG TERM GOALS: Target date: 10/15/2023  Patient will be independent with a long-term home exercise program for self-management of symptoms.  Baseline: Continue with past HEP (07/23/23); Goal status: INITIAL  2.  Patient will demonstrate improved Upper Extremity Functional Scale (UEFS) to equal or greater than 64/80 to demonstrate improvement in overall condition and self-reported functional ability.  Baseline: to be measured at visit 2 as appropriate (07/23/23); Goal status: INITIAL  3.  Patient will successfully participate in a exercise program for health at least 3 days a week without exacerbation of symptoms.  Baseline: not participating and difficulty participating due to symptom exacerbation (07/23/23); Goal status: INITIAL  4.  R shoulder strength will improve at least 1/2 MMT grade or be 4+/5 or more without increased pain to improve his ability to work on projects, home maintenance, and a fitness program.  Baseline: weak and painful - see objective (07/23/23); Goal status: INITIAL  5.  Patient will demonstrate improvement in Patient Specific Functional Scale (PSFS) of equal or greater than 8/10 points for all three body parts to reflect clinically significant improvement in patient's most valued functional activities. Baseline: to be measured at visit 2 as appropriate (07/23/23); Goal status: INITIAL  6.  Patient will report NPRS equal or less than 3/10 during functional activities during the last 2 weeks to improve their abilitly to complete community, work and/or recreational activities with less limitation. Baseline: to be measured visit 2 as appropriate (07/23/23); Goal status: INITIAL    PLAN:  PT FREQUENCY: 1-2x/week  PT DURATION: 8-12 weeks  PLANNED INTERVENTIONS: 97164- PT Re-evaluation, 97750- Physical Performance Testing, 97110-Therapeutic exercises, 97530- Therapeutic activity, [id number]- Neuromuscular re-education,  97535- Self Care, 16109- Manual therapy, [id number]- Electrical stimulation (unattended), [phone]- Traction (mechanical), 20560 (1-2 muscles), 20561 (3+ muscles)- Dry Needling, Patient/Family education, Joint mobilization, Spinal mobilization, Cryotherapy, and Moist heat.  PLAN FOR NEXT SESSION: update HEP as appropriate, graded/progressive postural/UE/functional strengthening, core strengthening, manual therapy as needed, mechanical traction. Gentle R shoulder stability exercises progressing to strengthening as tolerated.    Carilyn Charles. Artemio Larry, PT, DPT 07/23/23, 8:08 PM  [facility] [address] [place], [place] [address] P: [phone] I F: [phone]

## 2023-07-25 ENCOUNTER — Ambulatory Visit: Admitting: Physical Therapy

## 2023-07-25 ENCOUNTER — Encounter: Payer: Self-pay | Admitting: Physical Therapy

## 2023-07-25 DIAGNOSIS — M546 Pain in thoracic spine: Secondary | ICD-10-CM

## 2023-07-25 DIAGNOSIS — M542 Cervicalgia: Secondary | ICD-10-CM

## 2023-07-25 DIAGNOSIS — M6281 Muscle weakness (generalized): Secondary | ICD-10-CM

## 2023-07-25 DIAGNOSIS — M25511 Pain in right shoulder: Secondary | ICD-10-CM | POA: Diagnosis not present

## 2023-07-25 NOTE — Therapy (Signed)
 OUTPATIENT PHYSICAL THERAPY TREATMENT   Patient Name: Charles Mooney MRN: [id number] DOB:09/02/1955, 68 y.o., male Today's Date: 07/25/2023  END OF SESSION:  PT End of Session - 07/25/23 1043     Visit Number 2    Number of Visits 17    Date for PT Re-Evaluation 10/15/23    Authorization Type MEDICARE PART B reporting period from 07/23/2023    Progress Note Due on Visit 10    PT Start Time 1035    PT Stop Time 1123    PT Time Calculation (min) 48 min    Activity Tolerance Patient tolerated treatment well    Behavior During Therapy [facility] for tasks assessed/performed                 Past Medical History:  Diagnosis Date   Arthritis    Hepatitis    c 2000   Hypertension    Subarachnoid hemorrhage (HCC) 2017   Wears hearing aid in both ears    Past Surgical History:  Procedure Laterality Date   BRAIN SURGERY     [facility] COILING 2017   HERNIA REPAIR  1963   double hernia repair   JOINT REPLACEMENT Bilateral    TKR  L-2012, R-2015   SHOULDER ARTHROSCOPY WITH ROTATOR CUFF REPAIR AND OPEN BICEPS TENODESIS Left 09/08/2018   Procedure: LEFT SHOULDER ARTHROSCOPY,SUBSACAPULARIS REPAIR, SUBACROMIAL DECOMP, DISTAL CLAVICLE EXCISION,BICEP TENODESIS, MINI OPEN REGENTEN PATCH APPLICATION;  Surgeon: Lorri Rota, MD;  Location: [facility] ORS;  Service: Orthopedics;  Laterality: Left;   Patient Active Problem List   Diagnosis Date Noted   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   Closed fracture of rib of left side with routine healing 09/04/2022   Chronic pain of left heel 08/31/2021   Arthropathy of left wrist 10/12/2020   Viral hepatitis C 11/02/2019   Lumbar facet arthropathy 04/27/2019   Lumbar degenerative disc disease 04/27/2019   Localized osteoarthritis of shoulder regions, bilateral 04/27/2019   Encounter for long-term opiate analgesic use 04/27/2019   Chronic SI joint pain 04/27/2019   Chronic pain syndrome 04/27/2019   Pain in joint of left shoulder 06/03/2018   Abnormal gait  06/18/2017   Anatomical narrow angle 06/18/2017   Derangement of posterior horn of medial meniscus 06/18/2017   Edema 06/18/2017   Hip pain 06/18/2017   History of total knee arthroplasty 06/18/2017   Localized, primary osteoarthritis 06/18/2017   Muscle weakness 06/18/2017   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   Acute bursitis of right shoulder 06/14/2017   Nonallopathic lesion of sacral region 06/14/2017   Nonallopathic lesion of thoracic region 06/14/2017   Nonallopathic lesion of lumbosacral region 06/14/2017   Biomechanical lesion 06/14/2017   Chronic bilateral low back pain with bilateral sciatica 05/23/2017   Arthritis 04/19/2013   Cataract nuclear 06/16/2012   Glaucoma suspect of both eyes 06/16/2012    PCP: Rex Castor, MD  REFERRING PROVIDER: Cephus Collin, MD  REFERRING DIAG: cervicalgia, arthritis of right shoulder region, myofascial pain syndrome of thoracic spine  Rationale for Evaluation and Treatment: Rehabilitation  THERAPY DIAG:  Right shoulder pain, unspecified chronicity  Cervicalgia  Muscle weakness (generalized)  Pain in thoracic spine  ONSET DATE: Neck pain and shoulder pain worsened since January 2025, October 2024 for right scapular pain (approximately 3 months after rib fracture in July 2024)  SUBJECTIVE:  PERTINENT HISTORY:  Patient is a 68 y.o. male who presents to outpatient physical therapy with a referral for medical diagnosis of cervicalgia, arthritis of right shoulder region, myofascial pain syndrome of thoracic spine. This patient's chief complaints consist of acute on chronic R shoulder pain and popping, neck pain, and R shoulder pain leading to the following functional deficits: limits him with everyday activities/tasks to some amount, working on  projects, working on the computer, walking for exercise, sleeping, working in his shop, house maintenance and projects, doing as much as he wants to, regular exercise.   Relevant past medical history and comorbidities include bilateral shoulder surgery (RTC repair, R most recent 12/2020) and subarachnoid hemorrhage in 2017, depression. Patient denies hx of cancer, seizures, unexplained weight loss, unexplained changes in bowel or bladder problems, unexplained stumbling or dropping things, back surgery.   Exercise history: he used to do bench presses, flies, bent over rows, lat pull in front and behind head, curls, triceps extensions overhead. He worked out at home 2-3/x a week but he always struggled to be consistent. 45-60 minutes. He stopped approx 10 years ago when he started building his current house. Then he had a stroke and then it was all he could do to work. He did not have any extra energy or willpower at that point. He is currently doing all of this for his grandkids.   He struggles with depression, Sees someone for talk therapy 1x a month (50 minutes) with the same person. He is limited by the fact he has to pay out of pocket. He takes celexa and his GP switched him to Wellbutrin. He did not like this so he went back to celexa. He has not tried any other medications for that. He had hepatitis C in the early 90s. He was put on interferon and rivovinan. That caused some really serious depression and was put on something then but cannot remember what it was (it might have been amitriptyline, and it was wild). He feels his depression got fully blown and noticeable about 3 years ago, around the time he got his shoulder done. At that time he made a decision to move at that time and it really triggered it because he built the house he is in now and he had planned to retire there. He has not moved yet, but he is currently packing. He also feels like his depression is also related to not being in shape. He  thinks if he could get his pain and physical conditioning under control, his depression would be much better.    SUBJECTIVE STATEMENT: Patient states he is having a good day today. He thinks some of the shoulder PROM for measurements helped his shoulder. He did HEP once since last PT session, with no questions or concerns about it.   PAIN: Are you having pain? Yes;  NPRS: 3/10 neck, 3/10 R shoulder, 3/10 R periscapular region     FUNCTIONAL LIMITATIONS:limits him with everyday activities/tasks to some amount, working on projects, working on the computer, walking for exercise, sleeping, working in his shop, house maintenance and projects, doing as much as he wants to, regular exercise  LEISURE: working in the shop, building and fixing things  PRECAUTIONS: None   WEIGHT BEARING RESTRICTIONS: No   PLOF: Independent  PATIENT GOALS: to get rid of the pain, to get on an exercise and stretching regimen for his overall health, sleep with less difficulty from pain.  Next MD appointment: June or July with Dr. Lateef  OBJECTIVE   1RM TESTING Bent Over Row with contralateral UE braced (last tested 03/27/2023): 1 RM for Right Arm: 75.8  1 RM for Left Arm: 80.2   Bent Over Shoulder Scaption ("Y"):  (last tested 03/27/2023): 1 RM for Right Arm: 8.9  1 RM for Left Arm: 9.1   TREATMENT                                                                                                                           Therapeutic exercise: therapeutic exercises that incorporate ONE parameter at one or more areas of the body to centralize symptoms, develop strength and endurance, range of motion, and flexibility required for successful completion of functional activities.   UBE L6, 3 min forward, 3 min backward.    Seated cervical spine rotational SNAG with strap 2x12 each side   Seated cervical retractions into blue ball on wall for resistance:  3x12  Hooklying B shoulder flexion with PVC  stick 1x10   Sidelying L shoulder ER 3x16 AROM not painful but very fatiguing R knee on mat to prevent compensatory roll Towel roll under L elbow  Standing shoulder IR at neutral (nothing under elbow, but pulling fist to mid trunk).  3x15 each side with 10#cable except last set on R 10 due to fatigue.   Pt required multimodal cuing for proper technique and to facilitate improved neuromuscular control, strength, range of motion, and functional ability resulting in improved performance and form.  Manual therapy: to reduce pain and tissue tension, improve range of motion, neuromodulation, in order to promote improved ability to complete functional activities. SUPINE/HOOKLYING R shoulder GHJ mobs grade II-IV 3x30 seconds caudal glide at > 90 degrees abduction 3x30 seconds AP glide    Mechanical Traction: to improve cervical spine and thoracic pain, and help decrease muscle tension.  Type: Cervical Min (lb): 0 Max (lb): 30-35  Hold time: 30 seconds Rest time: 10 seconds Pull angle: ~20 degrees Time: 13 plus set up and take down Result:  good tolerance during, no complaints after, decreased neck pain.      PATIENT EDUCATION:  Education details: Exercise purpose/form. Self management techniques. Education on diagnosis, prognosis, POC, anatomy and physiology of current condition. Education on HEP including handout. Person educated: Patient Education method: Explanation, Demonstration Education comprehension: verbalized understanding and needs further education  HOME EXERCISE PROGRAM:  From previous POC, to be updated:   Access Code: [email or web address] URL: [email or web address] Date: 05/17/2023 Prepared by: Alleen Isle  Exercises - Dumbbell Squat at Shoulders  - 1 x daily - 2-3 x weekly - 2 sets - 20 reps - Seated Thoracic Lumbar Extension  - 1 x daily - 1 sets - 20 reps - 5 seconds hold - Standing Row with Anchored Resistance  - 3 x weekly - 2 sets - 20 reps -  Resistance Pulldown with March  - 3 x weekly - 2 sets - 20 reps - Curator  - 3 x weekly -  2 sets - 1 min each arm carry time - Sitting Chest Press With Kettlebell  - 3 x weekly - 2 sets - 15 reps - Quadruped Cat Cow  - 1 x daily - 2 sets - 10 reps - Child's Pose with Sidebending  - 1 x daily - 2 sets - 30 hold - Quadruped Rock Back into [employer] Up  - 1 x daily - 1 sets - 10 reps - Prone Press Up  - 1 x daily - 2 sets - 10 reps - Standing Bent Over Single Arm Scapular Row with Table Support with PLB  - 3 x weekly - 2 sets - 15-20 reps - Single Arm Bent Over Shoulder Scaption with Dumbbell  - 3 x weekly - 2 sets - 15-20 reps - Sub-Occipital Cervical Stretch  - 1 x daily - 1 sets - 10 reps - 5 second hold - Thoracic Stretch on Foam Roll - Hands Clasped  - 1-2 sets - 10 reps - Snow Angels on Foam Roll  - 1 x daily - 2 sets - 10 reps - Prone Angels  - Seated Upper Trapezius Stretch  - 1-2 x daily - 2-3 reps - 60 seconds hold  HOME EXERCISE PROGRAM [[id number]] View at "[email or web address]" using code: [email or web address] Prayer Stretch -  Repeat 10 Repetitions, Hold 2 Seconds, Complete 2 Sets, Perform 3 Times a Day  HOME EXERCISE PROGRAM [[id number]] View at [email or web address] using code: [id number] Suboccipital Stretch and C1-C2 Mobilization -  Repeat 20 Repetitions, Hold 2 Seconds, Perform 3 Times a Day  HOME EXERCISE PROGRAM [[email or web address]] View at [email or web address] using code [email or web address] LAT PULLDOWN - SINGLE ARM  -  Repeat 15 Repetitions, Complete 3 Sets, Perform 1 Times a Day  HOME EXERCISE PROGRAM Created by Alleen Isle, PT, DPT May 5th, 2025 View at [email or web address] code FLTASRA Repeated Shoulder Extension MDT: Perform 10-12 reps every 2-3 hours Cervical Rotation SNAG: Repeat 10 Times Hold 3 Seconds each level 1-2x a day To be more specific with this, line up the edge of the linen at the level of your neck where the stiffness exists. or move along the spine about 1/2  inch lower each time (4-6 levels)  HOME EXERCISE PROGRAM [[id number]] View at [email or web address] code [id number] Retraction in Prone with Overpressure- McKenzie -  Repeat 10 Repetitions, Hold 1 Second(s), Perform 8 Times a Day OR Retraction Extension in Prone- McKenzie -  Repeat 10 Repetitions, Hold 1 Second(s), Perform 8 Times a Day    ASSESSMENT:  CLINICAL IMPRESSION: Patient arrives with improved pain. Focused on decreasing R shoulder pain and improving cuff strength at the right shoulder. Now able to tolerate ER AROM but fatigued quickly. Patient would benefit from continued management of limiting condition by skilled physical therapist to address remaining impairments and functional limitations to work towards stated goals and return to PLOF or maximal functional independence.   From initial PT evaluation on 07/23/2023:  Patient is a 68 y.o. male referred to outpatient physical therapy with a medical diagnosis of  cervicalgia, arthritis of right shoulder region, myofascial pain syndrome of thoracic spine who presents with signs and symptoms consistent with cervicalgia possibly radiating to R periscapular region, acute on chronic R shoulder pain s/p RTC repair in 2022. He has increased joint play and painful popping consistent with SLAP lesion as well as weak and painful ER and abduction suggesting rotator cuff related pain in the R shoulder. Patient presents with significant pain, joint stability, ROM, joint stiffness, muscle performance (strength/power/endurance), activity tolerance,  and knowledge impairments that are limiting ability to complete everyday activities/tasks to some amount, working on projects, working on the computer, walking for exercise, sleeping, working in his shop, house maintenance and projects, doing as much as he wants to, regular exercise without difficulty. Patient will benefit from skilled physical therapy intervention to address current body structure impairments and  activity limitations to improve function and work towards goals set in current POC in order to return to prior level of function or maximal functional improvement.    OBJECTIVE IMPAIRMENTS: decreased activity tolerance, decreased endurance, decreased knowledge of condition, decreased mobility, joint stiffness, joint instability, decreased ROM, decreased strength, hypomobility, impaired perceived functional ability, increased muscle spasms, impaired flexibility, impaired UE functional use, postural dysfunction, and pain.   ACTIVITY LIMITATIONS: carrying, lifting, bending, sitting, standing, squatting, sleeping, transfers, bed mobility, bathing, dressing, hygiene/grooming, reaching overhead, stabilizing with B UE, locomotion level, and caring for others  PARTICIPATION LIMITATIONS: meal prep, cleaning, laundry, interpersonal relationship, driving, shopping, community activity, yard work, and  Advertising account executive, working on projects, working on [employer], walking for exercise, sleeping, working in his shop, house maintenance and projects, doing as much as he wants to, regular exercise  PERSONAL FACTORS: Age, Past/current experiences, Time since onset of injury/illness/exacerbation,behavior,  and 3+ comorbidities: depression, bilateral shoulder surgery (RTC repair, R most recent 12/2020), bilateral TKA, subarachnoid hemorrhage 2017 with SAH coiling, HTN, hx of hepatitis C (cured), double hernia repair, plantar facial fibromatosis, chronic pain syndrome, former smoker, Chronic bilateral low back pain with bilateral sciatica; Acute bursitis of right shoulder; Lumbar facet arthropathy; Lumbar degenerative disc disease; Localized osteoarthritis of shoulder regions, bilateral; Chronic SI joint pain; Chronic pain syndrome; Pain in joint of left shoulder; Abnormal gait; Anatomical narrow angle; Biomechanical lesion; Cataract nuclear; Derangement of posterior horn of medial meniscus; Edema; Glaucoma suspect of  both eyes; Hip pain; History of total knee arthroplasty; Localized, primary osteoarthritis; Hx of subarachnoid hemophage, Muscle weakness; Arthritis; Plantar fascial fibromatosis; Viral hepatitis C; Arthropathy of left wrist; Chronic pain of left heel; Intercostal neuralgia; and Closed fracture of rib of left side with routine healing,  has a past medical history of Arthritis, Hepatitis, Hypertension, Subarachnoid hemorrhage (HCC) (2017), and Wears hearing aid in both ears.  has a past surgical history that includes Brain surgery; Joint replacement (Bilateral); Shoulder arthroscopy with rotator cuff repair and open biceps tenodesis (Left, 09/08/2018); and Hernia repair (1963). Also had surgery on right shoulder are also affecting patient's functional outcome.   REHAB POTENTIAL: Good   CLINICAL DECISION MAKING: Evolving/moderate complexity  EVALUATION COMPLEXITY: Moderate   GOALS: Goals reviewed with patient? No  SHORT TERM GOALS: Target date: 08/06/2023  Patient will be independent with initial home exercise program for self-management of symptoms. Baseline: Continue with HEP from last episode of care (07/23/23); Goal status: INITIAL  LONG TERM GOALS: Target date: 10/15/2023  Patient will be independent with a long-term home exercise program for self-management of symptoms.  Baseline: Continue with past HEP (07/23/23); Goal status: INITIAL  2.  Patient will demonstrate improved Upper Extremity Functional Scale (UEFS) to equal or greater than 64/80 to demonstrate improvement in overall condition and self-reported functional ability.  Baseline: to be measured at visit 2 as appropriate (07/23/23); Goal status: INITIAL  3.  Patient will successfully participate in a exercise program for health at least 3 days a week without exacerbation of symptoms.  Baseline: not participating and difficulty participating due to symptom exacerbation (07/23/23); Goal status: INITIAL  4.  R shoulder strength  will improve at least 1/2 MMT grade or be 4+/5 or more without increased pain to improve his ability to work on projects, home maintenance, and a fitness program.  Baseline: weak and painful - see objective (07/23/23); Goal status: INITIAL  5.  Patient will demonstrate improvement in Patient Specific Functional Scale (PSFS) of equal or greater than 8/10 points for all three body parts to reflect clinically significant improvement in patient's most valued functional activities. Baseline: to be measured at visit 2 as appropriate (07/23/23); Goal status: INITIAL  6.  Patient will report NPRS equal or less than 3/10 during functional activities during the last 2 weeks to improve their abilitly to complete community, work and/or recreational activities with less limitation. Baseline: to be measured visit 2 as appropriate (07/23/23); Goal status: INITIAL    PLAN:  PT FREQUENCY: 1-2x/week  PT DURATION: 8-12 weeks  PLANNED INTERVENTIONS: 97164- PT Re-evaluation, 97750- Physical Performance Testing, 97110-Therapeutic exercises, 97530- Therapeutic activity, [id number]- Neuromuscular re-education, 97535- Self Care, 54098- Manual therapy, [id number]- Electrical stimulation (unattended), [phone]- Traction (mechanical), 20560 (1-2 muscles), 20561 (3+ muscles)- Dry Needling, Patient/Family education, Joint mobilization, Spinal mobilization, Cryotherapy, and Moist heat.  PLAN FOR NEXT SESSION: update HEP as appropriate, graded/progressive postural/UE/functional strengthening, core strengthening, manual therapy as needed, mechanical traction. Gentle R shoulder stability exercises progressing to strengthening as tolerated.    Carilyn Charles. Artemio Larry, PT, DPT 07/25/23, 12:59 PM  [facility] [address] [place], [place] [address] P: [phone] I F: [phone]

## 2023-07-29 ENCOUNTER — Ambulatory Visit: Admitting: Physical Therapy

## 2023-07-29 ENCOUNTER — Encounter: Payer: Self-pay | Admitting: Physical Therapy

## 2023-07-29 DIAGNOSIS — M25511 Pain in right shoulder: Secondary | ICD-10-CM

## 2023-07-29 DIAGNOSIS — M542 Cervicalgia: Secondary | ICD-10-CM

## 2023-07-29 DIAGNOSIS — M546 Pain in thoracic spine: Secondary | ICD-10-CM

## 2023-07-29 DIAGNOSIS — M6281 Muscle weakness (generalized): Secondary | ICD-10-CM

## 2023-07-29 NOTE — Therapy (Signed)
 OUTPATIENT PHYSICAL THERAPY TREATMENT   Patient Name: Charles Mooney MRN: [id number] DOB:[birth year], 68 y.o., male Today's Date: 07/29/2023  END OF SESSION:  PT End of Session - 07/29/23 1036     Visit Number 3    Number of Visits 17    Date for PT Re-Evaluation 10/15/23    Authorization Type MEDICARE PART B reporting period from 07/23/2023    Progress Note Due on Visit 10    PT Start Time 1032    PT Stop Time 1138    PT Time Calculation (min) 66 min    Activity Tolerance Patient tolerated treatment well    Behavior During Therapy [facility] for tasks assessed/performed                  Past Medical History:  Diagnosis Date   Arthritis    Hepatitis    c 2000   Hypertension    Subarachnoid hemorrhage (HCC) 2017   Wears hearing aid in both ears    Past Surgical History:  Procedure Laterality Date   BRAIN SURGERY     [facility] COILING 2017   HERNIA REPAIR  1963   double hernia repair   JOINT REPLACEMENT Bilateral    TKR  L-2012, R-2015   SHOULDER ARTHROSCOPY WITH ROTATOR CUFF REPAIR AND OPEN BICEPS TENODESIS Left 09/08/2018   Procedure: LEFT SHOULDER ARTHROSCOPY,SUBSACAPULARIS REPAIR, SUBACROMIAL DECOMP, DISTAL CLAVICLE EXCISION,BICEP TENODESIS, MINI OPEN REGENTEN PATCH APPLICATION;  Surgeon: Lorri Rota, MD;  Location: [facility] ORS;  Service: Orthopedics;  Laterality: Left;   Patient Active Problem List   Diagnosis Date Noted   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   Closed fracture of rib of left side with routine healing 09/04/2022   Chronic pain of left heel 08/31/2021   Arthropathy of left wrist 10/12/2020   Viral hepatitis C 11/02/2019   Lumbar facet arthropathy 04/27/2019   Lumbar degenerative disc disease 04/27/2019   Localized osteoarthritis of shoulder regions, bilateral 04/27/2019   Encounter for long-term opiate analgesic use 04/27/2019   Chronic SI joint pain 04/27/2019   Chronic pain syndrome 04/27/2019   Pain in joint of left shoulder 06/03/2018   Abnormal gait  06/18/2017   Anatomical narrow angle 06/18/2017   Derangement of posterior horn of medial meniscus 06/18/2017   Edema 06/18/2017   Hip pain 06/18/2017   History of total knee arthroplasty 06/18/2017   Localized, primary osteoarthritis 06/18/2017   Muscle weakness 06/18/2017   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   Acute bursitis of right shoulder 06/14/2017   Nonallopathic lesion of sacral region 06/14/2017   Nonallopathic lesion of thoracic region 06/14/2017   Nonallopathic lesion of lumbosacral region 06/14/2017   Biomechanical lesion 06/14/2017   Chronic bilateral low back pain with bilateral sciatica 05/23/2017   Arthritis 04/19/2013   Cataract nuclear 06/16/2012   Glaucoma suspect of both eyes 06/16/2012    PCP: Rex Castor, MD  REFERRING PROVIDER: Cephus Collin, MD  REFERRING DIAG: cervicalgia, arthritis of right shoulder region, myofascial pain syndrome of thoracic spine  Rationale for Evaluation and Treatment: Rehabilitation  THERAPY DIAG:  Right shoulder pain, unspecified chronicity  Cervicalgia  Muscle weakness (generalized)  Pain in thoracic spine  ONSET DATE: Neck pain and shoulder pain worsened since January 2025, October 2024 for right scapular pain (approximately 3 months after rib fracture in July 2024)  SUBJECTIVE:  PERTINENT HISTORY:  Patient is a 68 y.o. male who presents to outpatient physical therapy with a referral for medical diagnosis of cervicalgia, arthritis of right shoulder region, myofascial pain syndrome of thoracic spine. This patient's chief complaints consist of acute on chronic R shoulder pain and popping, neck pain, and R shoulder pain leading to the following functional deficits: limits him with everyday activities/tasks to some amount, working on  projects, working on the computer, walking for exercise, sleeping, working in his shop, house maintenance and projects, doing as much as he wants to, regular exercise.   Relevant past medical history and comorbidities include bilateral shoulder surgery (RTC repair, R most recent 12/2020) and subarachnoid hemorrhage in 2017, depression. Patient denies hx of cancer, seizures, unexplained weight loss, unexplained changes in bowel or bladder problems, unexplained stumbling or dropping things, back surgery.   Exercise history: he used to do bench presses, flies, bent over rows, lat pull in front and behind head, curls, triceps extensions overhead. He worked out at home 2-3/x a week but he always struggled to be consistent. 45-60 minutes. He stopped approx 10 years ago when he started building his current house. Then he had a stroke and then it was all he could do to work. He did not have any extra energy or willpower at that point. He is currently doing all of this for his grandkids.   He struggles with depression, Sees someone for talk therapy 1x a month (50 minutes) with the same person. He is limited by the fact he has to pay out of pocket. He takes celexa and his GP switched him to Wellbutrin. He did not like this so he went back to celexa. He has not tried any other medications for that. He had hepatitis C in the early 90s. He was put on interferon and rivovinan. That caused some really serious depression and was put on something then but cannot remember what it was (it might have been amitriptyline, and it was wild). He feels his depression got fully blown and noticeable about 3 years ago, around the time he got his shoulder done. At that time he made a decision to move at that time and it really triggered it because he built the house he is in now and he had planned to retire there. He has not moved yet, but he is currently packing. He also feels like his depression is also related to not being in shape. He  thinks if he could get his pain and physical conditioning under control, his depression would be much better.    SUBJECTIVE STATEMENT: Patient states his shoulder started hurting more on Saturday for no apparent reason. He did more of his HEP since last PT session than before that. He continues to feel cervical traction is helpful to him and would like to repeat it today.   PAIN: Are you having pain? Yes;  NPRS: 2/10 neck, 4/10 R anterior shoulder, 3/10 R periscapular region     FUNCTIONAL LIMITATIONS:limits him with everyday activities/tasks to some amount, working on projects, working on the computer, walking for exercise, sleeping, working in his shop, house maintenance and projects, doing as much as he wants to, regular exercise  LEISURE: working in the shop, building and fixing things  PRECAUTIONS: None   WEIGHT BEARING RESTRICTIONS: No   PLOF: Independent  PATIENT GOALS: to get rid of the pain, to get on an exercise and stretching regimen for his overall health, sleep with less difficulty from pain.  Next  MD appointment: June or July with Dr. Rhesa Celeste  OBJECTIVE   1RM TESTING Bent Over Row with contralateral UE braced (last tested 03/27/2023): 1 RM for Right Arm: 75.8  1 RM for Left Arm: 80.2   Bent Over Shoulder Scaption ("Y"):  (last tested 03/27/2023): 1 RM for Right Arm: 8.9  1 RM for Left Arm: 9.1   TREATMENT                                                                                                                           Therapeutic exercise: therapeutic exercises that incorporate ONE parameter at one or more areas of the body to centralize symptoms, develop strength and endurance, range of motion, and flexibility required for successful completion of functional activities.   UBE L10, 3 min forward, 3 min backward.    Seated cervical spine rotational SNAG with strap 2x12 each side   Seated cervical retractions into blue ball on wall for resistance:   3x12  Hooklying B shoulder flexion with PVC stick 2x12  Hooklying B shoulder flexion with pressing into horizontal adduction with Pilates ring.  2x12 Feels good   Superset:  Sidelying L shoulder ER 3x15 AROM not painful but very fatiguing R knee on mat to prevent compensatory roll Towel roll under L elbow  Standing shoulder IR at neutral (nothing under elbow, but pulling fist to mid trunk).  3x15 R     Therapeutic activities: dynamic therapeutic activities incorporating MULTIPLE parameters or areas of the body designed to achieve improved functional performance.  Hookling chest press with plus (for improved pushing, working, exercising) 2x12 with 15#DB in each hand 1x10 with 15#DB in each hand  OMEGA superset (for improved pulling):             Standing row with scap retractions: 3x15, 35#             Lat pull down: 3x15, 55#  Inclined B shoulder scaption on [employer] with gentle cervical spine retraction to improve posterior chain strength and coordination for reaching 1x12 AROM 2x12 with 2#DB in each hand  R shoulder stability training for pushing and return to fitness routine R UE supported on OMEGA seat with capula in protraction and as much body weight over R UE as possible while bouncing a LAX ball with L UE.  2x20 bounces Attempted from quadruped with knees lifted but too difficult.   Pt required multimodal cuing for proper technique and to facilitate improved neuromuscular control, strength, range of motion, and functional ability resulting in improved performance and form.   Mechanical Traction: to improve cervical spine and thoracic pain, and help decrease muscle tension.  Type: Cervical Min (lb): 0 Max (lb): 30-35  Hold time: 60 seconds Rest time: 20 seconds Pull angle: ~20 degrees Time: 15 plus set up and take down Result:  good tolerance during, no complaints after, decreased neck pain.     PATIENT EDUCATION:  Education details: Exercise  purpose/form. Self management  techniques. Education on diagnosis, prognosis, POC, anatomy and physiology of current condition. Education on HEP including handout. Person educated: Patient Education method: Explanation, Demonstration Education comprehension: verbalized understanding and needs further education  HOME EXERCISE PROGRAM:  From previous POC, to be updated:   Access Code: [email or web address] URL: [email or web address] Date: 05/17/2023 Prepared by: Alleen Isle  Exercises - Dumbbell Squat at Shoulders  - 1 x daily - 2-3 x weekly - 2 sets - 20 reps - Seated Thoracic Lumbar Extension  - 1 x daily - 1 sets - 20 reps - 5 seconds hold - Standing Row with Anchored Resistance  - 3 x weekly - 2 sets - 20 reps - Resistance Pulldown with March  - 3 x weekly - 2 sets - 20 reps - Curator  - 3 x weekly - 2 sets - 1 min each arm carry time - Sitting Chest Press With Kettlebell  - 3 x weekly - 2 sets - 15 reps - Quadruped Cat Cow  - 1 x daily - 2 sets - 10 reps - Child's Pose with Sidebending  - 1 x daily - 2 sets - 30 hold - Quadruped Rock Back into [employer] Up  - 1 x daily - 1 sets - 10 reps - Prone Press Up  - 1 x daily - 2 sets - 10 reps - Standing Bent Over Single Arm Scapular Row with Table Support with PLB  - 3 x weekly - 2 sets - 15-20 reps - Single Arm Bent Over Shoulder Scaption with Dumbbell  - 3 x weekly - 2 sets - 15-20 reps - Sub-Occipital Cervical Stretch  - 1 x daily - 1 sets - 10 reps - 5 second hold - Thoracic Stretch on Foam Roll - Hands Clasped  - 1-2 sets - 10 reps - Snow Angels on Foam Roll  - 1 x daily - 2 sets - 10 reps - Prone Angels  - Seated Upper Trapezius Stretch  - 1-2 x daily - 2-3 reps - 60 seconds hold  HOME EXERCISE PROGRAM [[id number]] View at "[email or web address]" using code: [email or web address] Prayer Stretch -  Repeat 10 Repetitions, Hold 2 Seconds, Complete 2 Sets, Perform 3 Times a Day  HOME EXERCISE PROGRAM [[id number]] View at  [email or web address] using code: [id number] Suboccipital Stretch and C1-C2 Mobilization -  Repeat 20 Repetitions, Hold 2 Seconds, Perform 3 Times a Day  HOME EXERCISE PROGRAM [[id number]] View at [email or web address] using code [email or web address] LAT PULLDOWN - SINGLE ARM  -  Repeat 15 Repetitions, Complete 3 Sets, Perform 1 Times a Day  HOME EXERCISE PROGRAM Created by Alleen Isle, PT, DPT May 5th, 2025 View at [email or web address] code FLTASRA Repeated Shoulder Extension MDT: Perform 10-12 reps every 2-3 hours Cervical Rotation SNAG: Repeat 10 Times Hold 3 Seconds each level 1-2x a day To be more specific with this, line up the edge of the linen at the level of your neck where the stiffness exists. or move along the spine about 1/2 inch lower each time (4-6 levels)  HOME EXERCISE PROGRAM [[id number]] View at [email or web address] code [id number] Retraction in Prone with Overpressure- McKenzie -  Repeat 10 Repetitions, Hold 1 Second(s), Perform 8 Times a Day OR Retraction Extension in Prone- McKenzie -  Repeat 10 Repetitions, Hold 1 Second(s), Perform 8 Times a Day    ASSESSMENT:  CLINICAL IMPRESSION: Continued POC with focus on improving postural, GHJ, and scapular strength and endurance to improve his ability to perform functional activities. Patient tolerated all exercises well and reported feeling  better by end of session. He continues to demonstrate a lot of weakness with R shoulder ER and limited capacity to stabilize R shoulder when weight bearing through it. Continued with cervical spine traction per pt preference and previous positive response. Patient would benefit from continued management of limiting condition by skilled physical therapist to address remaining impairments and functional limitations to work towards stated goals and return to PLOF or maximal functional independence.   From initial PT evaluation on 07/23/2023:  Patient is a 68 y.o. male referred to outpatient physical therapy  with a medical diagnosis of  cervicalgia, arthritis of right shoulder region, myofascial pain syndrome of thoracic spine who presents with signs and symptoms consistent with cervicalgia possibly radiating to R periscapular region, acute on chronic R shoulder pain s/p RTC repair in 2022. He has increased joint play and painful popping consistent with SLAP lesion as well as weak and painful ER and abduction suggesting rotator cuff related pain in the R shoulder. Patient presents with significant pain, joint stability, ROM, joint stiffness, muscle performance (strength/power/endurance), activity tolerance, and knowledge impairments that are limiting ability to complete everyday activities/tasks to some amount, working on projects, working on the computer, walking for exercise, sleeping, working in his shop, house maintenance and projects, doing as much as he wants to, regular exercise without difficulty. Patient will benefit from skilled physical therapy intervention to address current body structure impairments and activity limitations to improve function and work towards goals set in current POC in order to return to prior level of function or maximal functional improvement.    OBJECTIVE IMPAIRMENTS: decreased activity tolerance, decreased endurance, decreased knowledge of condition, decreased mobility, joint stiffness, joint instability, decreased ROM, decreased strength, hypomobility, impaired perceived functional ability, increased muscle spasms, impaired flexibility, impaired UE functional use, postural dysfunction, and pain.   ACTIVITY LIMITATIONS: carrying, lifting, bending, sitting, standing, squatting, sleeping, transfers, bed mobility, bathing, dressing, hygiene/grooming, reaching overhead, stabilizing with B UE, locomotion level, and caring for others  PARTICIPATION LIMITATIONS: meal prep, cleaning, laundry, interpersonal relationship, driving, shopping, community activity, yard work, and  Arboriculturist, working on projects, working on [employer], walking for exercise, sleeping, working in his shop, house maintenance and projects, doing as much as he wants to, regular exercise  PERSONAL FACTORS: Age, Past/current experiences, Time since onset of injury/illness/exacerbation,behavior,  and 3+ comorbidities: depression, bilateral shoulder surgery (RTC repair, R most recent 12/2020), bilateral TKA, subarachnoid hemorrhage 2017 with SAH coiling, HTN, hx of hepatitis C (cured), double hernia repair, plantar facial fibromatosis, chronic pain syndrome, former smoker, Chronic bilateral low back pain with bilateral sciatica; Acute bursitis of right shoulder; Lumbar facet arthropathy; Lumbar degenerative disc disease; Localized osteoarthritis of shoulder regions, bilateral; Chronic SI joint pain; Chronic pain syndrome; Pain in joint of left shoulder; Abnormal gait; Anatomical narrow angle; Biomechanical lesion; Cataract nuclear; Derangement of posterior horn of medial meniscus; Edema; Glaucoma suspect of both eyes; Hip pain; History of total knee arthroplasty; Localized, primary osteoarthritis; Hx of subarachnoid hemophage, Muscle weakness; Arthritis; Plantar fascial fibromatosis; Viral hepatitis C; Arthropathy of left wrist; Chronic pain of left heel; Intercostal neuralgia; and Closed fracture of rib of left side with routine healing,  has a past medical history of Arthritis, Hepatitis, Hypertension, Subarachnoid hemorrhage (HCC) (2017), and Wears hearing aid in both ears.  has a past surgical history that includes Brain surgery; Joint replacement (Bilateral); Shoulder arthroscopy with rotator cuff repair and open biceps tenodesis (Left, 09/08/2018); and Hernia repair (1963). Also had surgery on right shoulder  are also affecting patient's functional outcome.   REHAB POTENTIAL: Good   CLINICAL DECISION MAKING: Evolving/moderate complexity  EVALUATION COMPLEXITY: Moderate   GOALS: Goals reviewed  with patient? No  SHORT TERM GOALS: Target date: 08/06/2023  Patient will be independent with initial home exercise program for self-management of symptoms. Baseline: Continue with HEP from last episode of care (07/23/23); Goal status: INITIAL  LONG TERM GOALS: Target date: 10/15/2023  Patient will be independent with a long-term home exercise program for self-management of symptoms.  Baseline: Continue with past HEP (07/23/23); Goal status: INITIAL  2.  Patient will demonstrate improved Upper Extremity Functional Scale (UEFS) to equal or greater than 64/80 to demonstrate improvement in overall condition and self-reported functional ability.  Baseline: to be measured at visit 2 as appropriate (07/23/23); Goal status: INITIAL  3.  Patient will successfully participate in a exercise program for health at least 3 days a week without exacerbation of symptoms.  Baseline: not participating and difficulty participating due to symptom exacerbation (07/23/23); Goal status: INITIAL  4.  R shoulder strength will improve at least 1/2 MMT grade or be 4+/5 or more without increased pain to improve his ability to work on projects, home maintenance, and a fitness program.  Baseline: weak and painful - see objective (07/23/23); Goal status: INITIAL  5.  Patient will demonstrate improvement in Patient Specific Functional Scale (PSFS) of equal or greater than 8/10 points for all three body parts to reflect clinically significant improvement in patient's most valued functional activities. Baseline: to be measured at visit 2 as appropriate (07/23/23); Goal status: INITIAL  6.  Patient will report NPRS equal or less than 3/10 during functional activities during the last 2 weeks to improve their abilitly to complete community, work and/or recreational activities with less limitation. Baseline: to be measured visit 2 as appropriate (07/23/23); Goal status: INITIAL    PLAN:  PT FREQUENCY: 1-2x/week  PT  DURATION: 8-12 weeks  PLANNED INTERVENTIONS: 97164- PT Re-evaluation, 97750- Physical Performance Testing, 97110-Therapeutic exercises, 97530- Therapeutic activity, [id number]- Neuromuscular re-education, 97535- Self Care, 40981- Manual therapy, [id number]- Electrical stimulation (unattended), [phone]- Traction (mechanical), 20560 (1-2 muscles), 20561 (3+ muscles)- Dry Needling, Patient/Family education, Joint mobilization, Spinal mobilization, Cryotherapy, and Moist heat.  PLAN FOR NEXT SESSION: update HEP as appropriate, graded/progressive postural/UE/functional strengthening, core strengthening, manual therapy as needed, mechanical traction. Gentle R shoulder stability exercises progressing to strengthening as tolerated.    Carilyn Charles. Artemio Larry, PT, DPT 07/29/23, 11:48 AM  [facility] [address] [place], [place] [address] P: [phone] I F: [phone]

## 2023-08-01 ENCOUNTER — Ambulatory Visit: Admitting: Physical Therapy

## 2023-08-01 ENCOUNTER — Encounter: Payer: Self-pay | Admitting: Physical Therapy

## 2023-08-01 DIAGNOSIS — M25511 Pain in right shoulder: Secondary | ICD-10-CM | POA: Diagnosis not present

## 2023-08-01 DIAGNOSIS — M546 Pain in thoracic spine: Secondary | ICD-10-CM

## 2023-08-01 DIAGNOSIS — M542 Cervicalgia: Secondary | ICD-10-CM

## 2023-08-01 DIAGNOSIS — M6281 Muscle weakness (generalized): Secondary | ICD-10-CM

## 2023-08-01 NOTE — Therapy (Signed)
 OUTPATIENT PHYSICAL THERAPY TREATMENT   Patient Name: Charles Mooney MRN: [id number] DOB:06-01-1955, 68 y.o., male Today's Date: 08/01/2023  END OF SESSION:  PT End of Session - 08/01/23 1223     Visit Number 4    Number of Visits 17    Date for PT Re-Evaluation 10/15/23    Authorization Type MEDICARE PART B reporting period from 07/23/2023    Progress Note Due on Visit 10    PT Start Time 1031    PT Stop Time 1139    PT Time Calculation (min) 68 min    Activity Tolerance Patient tolerated treatment well    Behavior During Therapy [facility] for tasks assessed/performed                Past Medical History:  Diagnosis Date   Arthritis    Hepatitis    c 2000   Hypertension    Subarachnoid hemorrhage (HCC) 2017   Wears hearing aid in both ears    Past Surgical History:  Procedure Laterality Date   BRAIN SURGERY     [facility] COILING 2017   HERNIA REPAIR  1963   double hernia repair   JOINT REPLACEMENT Bilateral    TKR  L-2012, R-2015   SHOULDER ARTHROSCOPY WITH ROTATOR CUFF REPAIR AND OPEN BICEPS TENODESIS Left 09/08/2018   Procedure: LEFT SHOULDER ARTHROSCOPY,SUBSACAPULARIS REPAIR, SUBACROMIAL DECOMP, DISTAL CLAVICLE EXCISION,BICEP TENODESIS, MINI OPEN REGENTEN PATCH APPLICATION;  Surgeon: Lorri Rota, MD;  Location: [facility] ORS;  Service: Orthopedics;  Laterality: Left;   Patient Active Problem List   Diagnosis Date Noted   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   Closed fracture of rib of left side with routine healing 09/04/2022   Chronic pain of left heel 08/31/2021   Arthropathy of left wrist 10/12/2020   Viral hepatitis C 11/02/2019   Lumbar facet arthropathy 04/27/2019   Lumbar degenerative disc disease 04/27/2019   Localized osteoarthritis of shoulder regions, bilateral 04/27/2019   Encounter for long-term opiate analgesic use 04/27/2019   Chronic SI joint pain 04/27/2019   Chronic pain syndrome 04/27/2019   Pain in joint of left shoulder 06/03/2018   Abnormal gait  06/18/2017   Anatomical narrow angle 06/18/2017   Derangement of posterior horn of medial meniscus 06/18/2017   Edema 06/18/2017   Hip pain 06/18/2017   History of total knee arthroplasty 06/18/2017   Localized, primary osteoarthritis 06/18/2017   Muscle weakness 06/18/2017   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   Acute bursitis of right shoulder 06/14/2017   Nonallopathic lesion of sacral region 06/14/2017   Nonallopathic lesion of thoracic region 06/14/2017   Nonallopathic lesion of lumbosacral region 06/14/2017   Biomechanical lesion 06/14/2017   Chronic bilateral low back pain with bilateral sciatica 05/23/2017   Arthritis 04/19/2013   Cataract nuclear 06/16/2012   Glaucoma suspect of both eyes 06/16/2012    PCP: Rex Castor, MD  REFERRING PROVIDER: Cephus Collin, MD  REFERRING DIAG: cervicalgia, arthritis of right shoulder region, myofascial pain syndrome of thoracic spine  Rationale for Evaluation and Treatment: Rehabilitation  THERAPY DIAG:  Right shoulder pain, unspecified chronicity  Cervicalgia  Muscle weakness (generalized)  Pain in thoracic spine  ONSET DATE: Neck pain and shoulder pain worsened since January 2025, October 2024 for right scapular pain (approximately 3 months after rib fracture in July 2024)  SUBJECTIVE:  PERTINENT HISTORY:  Patient is a 68 y.o. male who presents to outpatient physical therapy with a referral for medical diagnosis of cervicalgia, arthritis of right shoulder region, myofascial pain syndrome of thoracic spine. This patient's chief complaints consist of acute on chronic R shoulder pain and popping, neck pain, and R shoulder pain leading to the following functional deficits: limits him with everyday activities/tasks to some amount, working on  projects, working on the computer, walking for exercise, sleeping, working in his shop, house maintenance and projects, doing as much as he wants to, regular exercise.   Relevant past medical history and comorbidities include bilateral shoulder surgery (RTC repair, R most recent 12/2020) and subarachnoid hemorrhage in 2017, depression. Patient denies hx of cancer, seizures, unexplained weight loss, unexplained changes in bowel or bladder problems, unexplained stumbling or dropping things, back surgery.   Exercise history: he used to do bench presses, flies, bent over rows, lat pull in front and behind head, curls, triceps extensions overhead. He worked out at home 2-3/x a week but he always struggled to be consistent. 45-60 minutes. He stopped approx 10 years ago when he started building his current house. Then he had a stroke and then it was all he could do to work. He did not have any extra energy or willpower at that point. He is currently doing all of this for his grandkids.   He struggles with depression, Sees someone for talk therapy 1x a month (50 minutes) with the same person. He is limited by the fact he has to pay out of pocket. He takes celexa and his GP switched him to Wellbutrin. He did not like this so he went back to celexa. He has not tried any other medications for that. He had hepatitis C in the early 90s. He was put on interferon and rivovinan. That caused some really serious depression and was put on something then but cannot remember what it was (it might have been amitriptyline, and it was wild). He feels his depression got fully blown and noticeable about 3 years ago, around the time he got his shoulder done. At that time he made a decision to move at that time and it really triggered it because he built the house he is in now and he had planned to retire there. He has not moved yet, but he is currently packing. He also feels like his depression is also related to not being in shape. He  thinks if he could get his pain and physical conditioning under control, his depression would be much better.    SUBJECTIVE STATEMENT: Patient states he was doing pretty well yesterday and after last PT session. Last night while watching TV he went to turn his head and got a sharp shooting dislocation feeling in his neck, and it has been tight and sore since then. He had turned it to the left and hurt when he turned it back right. Pain is at the left base of the neck. He has been fairly active width his R shoulder. He noticed his R shoulder got tired and achy while holding his vibrating toothbrush. R scapula has been staying pretty calm and steady.   PAIN: Are you having pain? Yes;  NPRS: 3/10 L neck (hurts more when turning to the left), 4/10 R anterior shoulder, 4/10 R periscapular region     FUNCTIONAL LIMITATIONS:limits him with everyday activities/tasks to some amount, working on projects, working on the computer, walking for exercise, sleeping, working in his shop, house  maintenance and projects, doing as much as he wants to, regular exercise  LEISURE: working in the shop, building and fixing things  PRECAUTIONS: None   WEIGHT BEARING RESTRICTIONS: No   PLOF: Independent  PATIENT GOALS: to get rid of the pain, to get on an exercise and stretching regimen for his overall health, sleep with less difficulty from pain.  Next MD appointment: June or July with Dr. Rhesa Celeste  OBJECTIVE   1RM TESTING Bent Over Row with contralateral UE braced (last tested 03/27/2023): 1 RM for Right Arm: 75.8  1 RM for Left Arm: 80.2   Bent Over Shoulder Scaption (Y):  (last tested 03/27/2023): 1 RM for Right Arm: 8.9  1 RM for Left Arm: 9.1   TREATMENT                                                                                                                           Manual therapy: to reduce pain and tissue tension, improve range of motion, neuromodulation, in order to promote improved  ability to complete functional activities. PRONE UPA each side at cervical spine segment that reproduces concordant pain (~ C5) as well as one segment above and below (~C6 and C4) 1x30 seconds each side, each level CPA grade IV at T1 1x30 seconds Painful at all levels  Improved pain with left rotation, still a little stiff  Therapeutic exercise: therapeutic exercises that incorporate ONE parameter at one or more areas of the body to centralize symptoms, develop strength and endurance, range of motion, and flexibility required for successful completion of functional activities.   UBE L10, 3 min forward, 3 min backward.   (Manual therapy - see above)   Seated cervical spine rotational SNAG with strap 2x12 each side   Seated cervical retractions into blue ball on wall for resistance:  3x12  Hooklying B shoulder flexion with pressing into horizontal adduction with Pilates ring.  2x12   Sidelying L shoulder ER 2x15 AROM not painful but very fatiguing (added 1#DB part way through 2nd set) 1x15 with 1#DB R knee on mat to prevent compensatory roll Towel roll under L elbow  Hooklying pec stretch and shoulder angels while laying on half foam roll along entire spine.  Between sets of chest press  Therapeutic activities: dynamic therapeutic activities incorporating MULTIPLE parameters or areas of the body designed to achieve improved functional performance.  Hookling DB chest press with plus while laying on half foam roll along spine (for improved pushing, working, exercising) 3x12 with 15#DB in each hand  Inclined B shoulder scaption on [employer] with gentle cervical spine retraction to improve posterior chain strength and coordination for reaching 3x12 AROM Poor R shoulder ROM with 2#DB today.   Sled push (improved shoulder strength and stability for pushing) 2x30 feet with sled only (75#) 2x30 feet with sled + 50# 2x30 feet with sled + 140#   Pt required multimodal cuing for  proper technique and to facilitate improved neuromuscular control,  strength, range of motion, and functional ability resulting in improved performance and form.   Mechanical Traction: to improve cervical spine and thoracic pain, and help decrease muscle tension.  Type: Cervical Min (lb): 0 Max (lb): 35  Hold time: 30 seconds Rest time: 10 seconds Pull angle: ~20 degrees Time: 15 plus set up and take down Result:  good tolerance during, no complaints after, decreased neck pain.     PATIENT EDUCATION:  Education details: Exercise purpose/form. Self management techniques. Education on diagnosis, prognosis, POC, anatomy and physiology of current condition. Education on HEP including handout. Person educated: Patient Education method: Explanation, Demonstration Education comprehension: verbalized understanding and needs further education  HOME EXERCISE PROGRAM:  From previous POC, to be updated:   Access Code: [email or web address] URL: [email or web address] Date: 05/17/2023 Prepared by: Alleen Isle  Exercises - Dumbbell Squat at Shoulders  - 1 x daily - 2-3 x weekly - 2 sets - 20 reps - Seated Thoracic Lumbar Extension  - 1 x daily - 1 sets - 20 reps - 5 seconds hold - Standing Row with Anchored Resistance  - 3 x weekly - 2 sets - 20 reps - Resistance Pulldown with March  - 3 x weekly - 2 sets - 20 reps - Curator  - 3 x weekly - 2 sets - 1 min each arm carry time - Sitting Chest Press With Kettlebell  - 3 x weekly - 2 sets - 15 reps - Quadruped Cat Cow  - 1 x daily - 2 sets - 10 reps - Child's Pose with Sidebending  - 1 x daily - 2 sets - 30 hold - Quadruped Rock Back into [employer] Up  - 1 x daily - 1 sets - 10 reps - Prone Press Up  - 1 x daily - 2 sets - 10 reps - Standing Bent Over Single Arm Scapular Row with Table Support with PLB  - 3 x weekly - 2 sets - 15-20 reps - Single Arm Bent Over Shoulder Scaption with Dumbbell  - 3 x weekly - 2 sets - 15-20  reps - Sub-Occipital Cervical Stretch  - 1 x daily - 1 sets - 10 reps - 5 second hold - Thoracic Stretch on Foam Roll - Hands Clasped  - 1-2 sets - 10 reps - Snow Angels on Foam Roll  - 1 x daily - 2 sets - 10 reps - Prone Angels  - Seated Upper Trapezius Stretch  - 1-2 x daily - 2-3 reps - 60 seconds hold  HOME EXERCISE PROGRAM [[id number]] View at [email or web address] using code: [email or web address] Prayer Stretch -  Repeat 10 Repetitions, Hold 2 Seconds, Complete 2 Sets, Perform 3 Times a Day  HOME EXERCISE PROGRAM [[id number]] View at [email or web address] using code: [id number] Suboccipital Stretch and C1-C2 Mobilization -  Repeat 20 Repetitions, Hold 2 Seconds, Perform 3 Times a Day  HOME EXERCISE PROGRAM [[email or web address]] View at [email or web address] using code [email or web address] LAT PULLDOWN - SINGLE ARM  -  Repeat 15 Repetitions, Complete 3 Sets, Perform 1 Times a Day  HOME EXERCISE PROGRAM Created by Alleen Isle, PT, DPT May 5th, 2025 View at [email or web address] code [email or web address] Repeated Shoulder Extension MDT: Perform 10-12 reps every 2-3 hours Cervical Rotation SNAG: Repeat 10 Times Hold 3 Seconds each level 1-2x a day To be more specific with this, line up the edge of the linen at the level of your neck where the stiffness exists. or move along the spine about 1/2 inch lower each time (4-6 levels)  HOME EXERCISE PROGRAM [[id number]] View at [email or web address] code [id number] Retraction in Prone with Overpressure- McKenzie -  Repeat 10 Repetitions, Hold 1 Second(s), Perform 8 Times a Day OR Retraction Extension in Prone- McKenzie -  Repeat 10 Repetitions, Hold 1 Second(s), Perform 8 Times a Day    ASSESSMENT:  CLINICAL IMPRESSION: Patient's neck pain better after manual therapy and tolerated exercises well. Continued working on shoulder, neck, and scapular strength. Patient continues to be limited by left shoulder weakness and pain and neck and R scapular pain. Patient would benefit from continued  management of limiting condition by skilled physical therapist to address remaining impairments and functional limitations to work towards stated goals and return to PLOF or maximal functional independence.   From initial PT evaluation on 07/23/2023:  Patient is a 68 y.o. male referred to outpatient physical therapy with a medical diagnosis of  cervicalgia, arthritis of right shoulder region, myofascial pain syndrome of thoracic spine who presents with signs and symptoms consistent with cervicalgia possibly radiating to R periscapular region, acute on chronic R shoulder pain s/p RTC repair in 2022. He has increased joint play and painful popping consistent with SLAP lesion as well as weak and painful ER and abduction suggesting rotator cuff related pain in the R shoulder. Patient presents with significant pain, joint stability, ROM, joint stiffness, muscle performance (strength/power/endurance), activity tolerance, and knowledge impairments that are limiting ability to complete everyday activities/tasks to some amount, working on projects, working on the computer, walking for exercise, sleeping, working in his shop, house maintenance and projects, doing as much as he wants to, regular exercise without difficulty. Patient will benefit from skilled physical therapy intervention to address current body structure impairments and activity limitations to improve function and work towards goals set in current POC in order to return to prior level of function or maximal functional improvement.    OBJECTIVE IMPAIRMENTS: decreased activity tolerance, decreased endurance, decreased knowledge of condition, decreased mobility, joint stiffness, joint instability, decreased ROM, decreased strength, hypomobility, impaired perceived functional ability, increased muscle spasms, impaired flexibility, impaired UE functional use, postural dysfunction, and pain.   ACTIVITY LIMITATIONS: carrying, lifting, bending, sitting, standing,  squatting, sleeping, transfers, bed mobility, bathing, dressing, hygiene/grooming, reaching overhead, stabilizing with B UE, locomotion level, and caring for others  PARTICIPATION LIMITATIONS: meal prep, cleaning, laundry, interpersonal relationship, driving, shopping, community activity, yard work, and  Advertising account executive, working on projects, working on [employer], walking for exercise, sleeping, working in his shop, house maintenance and projects, doing as much as he wants to, regular exercise  PERSONAL FACTORS: Age, Past/current experiences, Time since onset of injury/illness/exacerbation,behavior,  and 3+ comorbidities: depression, bilateral shoulder surgery (RTC repair, R most recent 12/2020), bilateral TKA, subarachnoid hemorrhage 2017 with SAH coiling, HTN, hx of hepatitis C (cured), double hernia repair, plantar facial fibromatosis, chronic pain syndrome, former smoker, Chronic bilateral low back pain with bilateral sciatica; Acute bursitis of right shoulder; Lumbar facet arthropathy; Lumbar degenerative disc disease; Localized osteoarthritis of shoulder regions, bilateral; Chronic SI joint pain; Chronic pain syndrome; Pain in joint of left shoulder; Abnormal gait; Anatomical narrow angle; Biomechanical lesion; Cataract nuclear; Derangement of posterior horn of medial meniscus; Edema; Glaucoma suspect of both eyes; Hip pain; History of total knee arthroplasty; Localized, primary osteoarthritis; Hx of subarachnoid hemophage, Muscle weakness; Arthritis; Plantar fascial fibromatosis; Viral hepatitis C; Arthropathy of left wrist; Chronic pain of left heel; Intercostal neuralgia; and Closed fracture of rib of left side with routine healing,  has a past medical history of  Arthritis, Hepatitis, Hypertension, Subarachnoid hemorrhage (HCC) (2017), and Wears hearing aid in both ears.  has a past surgical history that includes Brain surgery; Joint replacement (Bilateral); Shoulder arthroscopy with rotator  cuff repair and open biceps tenodesis (Left, 09/08/2018); and Hernia repair (1963). Also had surgery on right shoulder are also affecting patient's functional outcome.   REHAB POTENTIAL: Good   CLINICAL DECISION MAKING: Evolving/moderate complexity  EVALUATION COMPLEXITY: Moderate   GOALS: Goals reviewed with patient? No  SHORT TERM GOALS: Target date: 08/06/2023  Patient will be independent with initial home exercise program for self-management of symptoms. Baseline: Continue with HEP from last episode of care (07/23/23); Goal status: INITIAL  LONG TERM GOALS: Target date: 10/15/2023  Patient will be independent with a long-term home exercise program for self-management of symptoms.  Baseline: Continue with past HEP (07/23/23); Goal status: INITIAL  2.  Patient will demonstrate improved Upper Extremity Functional Scale (UEFS) to equal or greater than 64/80 to demonstrate improvement in overall condition and self-reported functional ability.  Baseline: to be measured at visit 2 as appropriate (07/23/23); Goal status: INITIAL  3.  Patient will successfully participate in a exercise program for health at least 3 days a week without exacerbation of symptoms.  Baseline: not participating and difficulty participating due to symptom exacerbation (07/23/23); Goal status: INITIAL  4.  R shoulder strength will improve at least 1/2 MMT grade or be 4+/5 or more without increased pain to improve his ability to work on projects, home maintenance, and a fitness program.  Baseline: weak and painful - see objective (07/23/23); Goal status: INITIAL  5.  Patient will demonstrate improvement in Patient Specific Functional Scale (PSFS) of equal or greater than 8/10 points for all three body parts to reflect clinically significant improvement in patient's most valued functional activities. Baseline: to be measured at visit 2 as appropriate (07/23/23); Goal status: INITIAL  6.  Patient will report  NPRS equal or less than 3/10 during functional activities during the last 2 weeks to improve their abilitly to complete community, work and/or recreational activities with less limitation. Baseline: to be measured visit 2 as appropriate (07/23/23); Goal status: INITIAL    PLAN:  PT FREQUENCY: 1-2x/week  PT DURATION: 8-12 weeks  PLANNED INTERVENTIONS: 97164- PT Re-evaluation, 97750- Physical Performance Testing, 97110-Therapeutic exercises, 97530- Therapeutic activity, [id number]- Neuromuscular re-education, 97535- Self Care, 16109- Manual therapy, [id number]- Electrical stimulation (unattended), [phone]- Traction (mechanical), 20560 (1-2 muscles), 20561 (3+ muscles)- Dry Needling, Patient/Family education, Joint mobilization, Spinal mobilization, Cryotherapy, and Moist heat.  PLAN FOR NEXT SESSION: update HEP as appropriate, graded/progressive postural/UE/functional strengthening, core strengthening, manual therapy as needed, mechanical traction. Gentle R shoulder stability exercises progressing to strengthening as tolerated.    Carilyn Charles. Artemio Larry, PT, DPT 08/01/23, 12:31 PM  [facility] [address] [place], [place] [address] P: [phone] I F: [phone]

## 2023-08-05 ENCOUNTER — Ambulatory Visit: Admitting: Physical Therapy

## 2023-08-05 ENCOUNTER — Encounter: Payer: Self-pay | Admitting: Physical Therapy

## 2023-08-05 DIAGNOSIS — M6281 Muscle weakness (generalized): Secondary | ICD-10-CM

## 2023-08-05 DIAGNOSIS — M542 Cervicalgia: Secondary | ICD-10-CM

## 2023-08-05 DIAGNOSIS — M546 Pain in thoracic spine: Secondary | ICD-10-CM

## 2023-08-05 DIAGNOSIS — M25511 Pain in right shoulder: Secondary | ICD-10-CM | POA: Diagnosis not present

## 2023-08-05 NOTE — Therapy (Signed)
 OUTPATIENT PHYSICAL THERAPY TREATMENT   Patient Name: Charles Mooney MRN: [id number] DOB:1956-01-03, 68 y.o., male Today's Date: 08/05/2023  END OF SESSION:  PT End of Session - 08/05/23 1115     Visit Number 5    Number of Visits 17    Date for PT Re-Evaluation 10/15/23    Authorization Type MEDICARE PART B reporting period from 07/23/2023    Progress Note Due on Visit 10    PT Start Time 1021    PT Stop Time 1130    PT Time Calculation (min) 69 min    Activity Tolerance Patient tolerated treatment well    Behavior During Therapy [facility] for tasks assessed/performed                 Past Medical History:  Diagnosis Date   Arthritis    Hepatitis    c 2000   Hypertension    Subarachnoid hemorrhage (HCC) 2017   Wears hearing aid in both ears    Past Surgical History:  Procedure Laterality Date   BRAIN SURGERY     [facility] COILING 2017   HERNIA REPAIR  1963   double hernia repair   JOINT REPLACEMENT Bilateral    TKR  L-2012, R-2015   SHOULDER ARTHROSCOPY WITH ROTATOR CUFF REPAIR AND OPEN BICEPS TENODESIS Left 09/08/2018   Procedure: LEFT SHOULDER ARTHROSCOPY,SUBSACAPULARIS REPAIR, SUBACROMIAL DECOMP, DISTAL CLAVICLE EXCISION,BICEP TENODESIS, MINI OPEN REGENTEN PATCH APPLICATION;  Surgeon: Lorri Rota, MD;  Location: [facility] ORS;  Service: Orthopedics;  Laterality: Left;   Patient Active Problem List   Diagnosis Date Noted   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   Closed fracture of rib of left side with routine healing 09/04/2022   Chronic pain of left heel 08/31/2021   Arthropathy of left wrist 10/12/2020   Viral hepatitis C 11/02/2019   Lumbar facet arthropathy 04/27/2019   Lumbar degenerative disc disease 04/27/2019   Localized osteoarthritis of shoulder regions, bilateral 04/27/2019   Encounter for long-term opiate analgesic use 04/27/2019   Chronic SI joint pain 04/27/2019   Chronic pain syndrome 04/27/2019   Pain in joint of left shoulder 06/03/2018   Abnormal gait  06/18/2017   Anatomical narrow angle 06/18/2017   Derangement of posterior horn of medial meniscus 06/18/2017   Edema 06/18/2017   Hip pain 06/18/2017   History of total knee arthroplasty 06/18/2017   Localized, primary osteoarthritis 06/18/2017   Muscle weakness 06/18/2017   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   Acute bursitis of right shoulder 06/14/2017   Nonallopathic lesion of sacral region 06/14/2017   Nonallopathic lesion of thoracic region 06/14/2017   Nonallopathic lesion of lumbosacral region 06/14/2017   Biomechanical lesion 06/14/2017   Chronic bilateral low back pain with bilateral sciatica 05/23/2017   Arthritis 04/19/2013   Cataract nuclear 06/16/2012   Glaucoma suspect of both eyes 06/16/2012    PCP: Rex Castor, MD  REFERRING PROVIDER: Cephus Collin, MD  REFERRING DIAG: cervicalgia, arthritis of right shoulder region, myofascial pain syndrome of thoracic spine  Rationale for Evaluation and Treatment: Rehabilitation  THERAPY DIAG:  Right shoulder pain, unspecified chronicity  Cervicalgia  Muscle weakness (generalized)  Pain in thoracic spine  ONSET DATE: Neck pain and shoulder pain worsened since January 2025, October 2024 for right scapular pain (approximately 3 months after rib fracture in July 2024)  SUBJECTIVE:  PERTINENT HISTORY:  Patient is a 68 y.o. male who presents to outpatient physical therapy with a referral for medical diagnosis of cervicalgia, arthritis of right shoulder region, myofascial pain syndrome of thoracic spine. This patient's chief complaints consist of acute on chronic R shoulder pain and popping, neck pain, and R shoulder pain leading to the following functional deficits: limits him with everyday activities/tasks to some amount, working on  projects, working on the computer, walking for exercise, sleeping, working in his shop, house maintenance and projects, doing as much as he wants to, regular exercise.   Relevant past medical history and comorbidities include bilateral shoulder surgery (RTC repair, R most recent 12/2020) and subarachnoid hemorrhage in 2017, depression. Patient denies hx of cancer, seizures, unexplained weight loss, unexplained changes in bowel or bladder problems, unexplained stumbling or dropping things, back surgery.   Exercise history: he used to do bench presses, flies, bent over rows, lat pull in front and behind head, curls, triceps extensions overhead. He worked out at home 2-3/x a week but he always struggled to be consistent. 45-60 minutes. He stopped approx 10 years ago when he started building his current house. Then he had a stroke and then it was all he could do to work. He did not have any extra energy or willpower at that point. He is currently doing all of this for his grandkids.   He struggles with depression, Sees someone for talk therapy 1x a month (50 minutes) with the same person. He is limited by the fact he has to pay out of pocket. He takes celexa and his GP switched him to Wellbutrin. He did not like this so he went back to celexa. He has not tried any other medications for that. He had hepatitis C in the early 90s. He was put on interferon and rivovinan. That caused some really serious depression and was put on something then but cannot remember what it was (it might have been amitriptyline, and it was wild). He feels his depression got fully blown and noticeable about 3 years ago, around the time he got his shoulder done. At that time he made a decision to move at that time and it really triggered it because he built the house he is in now and he had planned to retire there. He has not moved yet, but he is currently packing. He also feels like his depression is also related to not being in shape. He  thinks if he could get his pain and physical conditioning under control, his depression would be much better.    SUBJECTIVE STATEMENT: Patient states he thinks his neck is hurting more this morning after sitting in the car for about an hour doing a telehealth visit. He felt really good after last PT session. This lasted through Saturday. No other updates since last PT session. He would like a copy of the workout he is supposed to be doing at home.       PAIN: Are you having pain? Yes;  NPRS: 4/10 center neck and constant, 4/10 R anterior shoulder, 3/10 R periscapular region     FUNCTIONAL LIMITATIONS:limits him with everyday activities/tasks to some amount, working on projects, working on the computer, walking for exercise, sleeping, working in his shop, house maintenance and projects, doing as much as he wants to, regular exercise  LEISURE: working in the shop, building and fixing things  PRECAUTIONS: None   WEIGHT BEARING RESTRICTIONS: No   PLOF: Independent  PATIENT GOALS: to get rid  of the pain, to get on an exercise and stretching regimen for his overall health, sleep with less difficulty from pain.  Next MD appointment: June or July with Dr. Rhesa Celeste  OBJECTIVE   1RM TESTING Bent Over Row with contralateral UE braced (last tested 03/27/2023): 1 RM for Right Arm: 75.8  1 RM for Left Arm: 80.2   Bent Over Shoulder Scaption (Y):  (last tested 03/27/2023): 1 RM for Right Arm: 8.9  1 RM for Left Arm: 9.1   TREATMENT                                                                                                                           Therapeutic exercise: therapeutic exercises that incorporate ONE parameter at one or more areas of the body to centralize symptoms, develop strength and endurance, range of motion, and flexibility required for successful completion of functional activities.   UBE L10, 3 min forward, 3 min backward.   Seated cervical spine rotational SNAG  with strap 2x12 each side   Seated cervical retractions into blue ball on wall for resistance:  3x12  Hooklying B shoulder flexion with pressing into horizontal adduction with Pilates ring.  2x12   Sidelying L shoulder ER 1x15 AROM not painful  2x15 with 1#DB mild increase in pain, fatiguing R knee on mat to prevent compensatory roll Towel roll under L elbow  Hooklying pec stretch and shoulder angels while laying on half foam roll along entire spine.  Between sets of chest press  Therapeutic activities: dynamic therapeutic activities incorporating MULTIPLE parameters or areas of the body designed to achieve improved functional performance.  Hookling DB chest press with plus while laying on half foam roll along spine (for improved pushing, working, exercising) 1x12 with 15#DB in each hand 2x12 with 20#DB in each hand  R shoulder pain up to 6/10 during  Superset:  Inclined B shoulder scaption on [employer] with gentle cervical spine retraction to improve posterior chain strength and coordination for reaching 1x12 with 1#DB 2x12 with 2#DBs  Seated OMEGA Lat pull down (for improved pulling): 3x12 at 55#  Superset:  Standing row with scap retractions at [facility] (for improved pulling): 3x12 at 35# 1x12 at 45# Sled push (improved shoulder strength and stability for pushing) 3x2x30 feet with sled + 140# Patient turns it 180 degrees between two 30 foot lengths  Pt required multimodal cuing for proper technique and to facilitate improved neuromuscular control, strength, range of motion, and functional ability resulting in improved performance and form.   Mechanical Traction: to improve cervical spine and thoracic pain, and help decrease muscle tension.  Type: Cervical Min (lb): 0 Max (lb): 35  Hold time: 30 seconds Rest time: 10 seconds Pull angle: ~20 degrees Time: 15 min  plus set up and take down Result:  good tolerance during, no complaints after, decreased neck pain.      PATIENT EDUCATION:  Education details: Exercise purpose/form. Self management techniques. Education on diagnosis,  prognosis, POC, anatomy and physiology of current condition. Education on HEP including handout. Person educated: Patient Education method: Explanation, Demonstration Education comprehension: verbalized understanding and needs further education  HOME EXERCISE PROGRAM:  From previous POC, to be updated:   Access Code: [email or web address] URL: [email or web address] Date: 05/17/2023 Prepared by: Alleen Isle  Exercises - Dumbbell Squat at Shoulders  - 1 x daily - 2-3 x weekly - 2 sets - 20 reps - Seated Thoracic Lumbar Extension  - 1 x daily - 1 sets - 20 reps - 5 seconds hold - Standing Row with Anchored Resistance  - 3 x weekly - 2 sets - 20 reps - Resistance Pulldown with March  - 3 x weekly - 2 sets - 20 reps - Curator  - 3 x weekly - 2 sets - 1 min each arm carry time - Sitting Chest Press With Kettlebell  - 3 x weekly - 2 sets - 15 reps - Quadruped Cat Cow  - 1 x daily - 2 sets - 10 reps - Child's Pose with Sidebending  - 1 x daily - 2 sets - 30 hold - Quadruped Rock Back into [employer] Up  - 1 x daily - 1 sets - 10 reps - Prone Press Up  - 1 x daily - 2 sets - 10 reps - Standing Bent Over Single Arm Scapular Row with Table Support with PLB  - 3 x weekly - 2 sets - 15-20 reps - Single Arm Bent Over Shoulder Scaption with Dumbbell  - 3 x weekly - 2 sets - 15-20 reps - Sub-Occipital Cervical Stretch  - 1 x daily - 1 sets - 10 reps - 5 second hold - Thoracic Stretch on Foam Roll - Hands Clasped  - 1-2 sets - 10 reps - Snow Angels on Foam Roll  - 1 x daily - 2 sets - 10 reps - Prone Angels  - Seated Upper Trapezius Stretch  - 1-2 x daily - 2-3 reps - 60 seconds hold  HOME EXERCISE PROGRAM [[id number]] View at [email or web address] using code: [email or web address] Prayer Stretch -  Repeat 10 Repetitions, Hold 2 Seconds, Complete 2 Sets, Perform 3 Times a  Day  HOME EXERCISE PROGRAM [[id number]] View at [email or web address] using code: [id number] Suboccipital Stretch and C1-C2 Mobilization -  Repeat 20 Repetitions, Hold 2 Seconds, Perform 3 Times a Day  HOME EXERCISE PROGRAM [[email or web address]] View at [email or web address] using code [email or web address] LAT PULLDOWN - SINGLE ARM  -  Repeat 15 Repetitions, Complete 3 Sets, Perform 1 Times a Day  HOME EXERCISE PROGRAM Created by Alleen Isle, PT, DPT May 5th, 2025 View at [email or web address] code [email or web address] Repeated Shoulder Extension MDT: Perform 10-12 reps every 2-3 hours Cervical Rotation SNAG: Repeat 10 Times Hold 3 Seconds each level 1-2x a day To be more specific with this, line up the edge of the linen at the level of your neck where the stiffness exists. or move along the spine about 1/2 inch lower each time (4-6 levels)  HOME EXERCISE PROGRAM [[id number]] View at [email or web address] code [email or web address] Retraction in Prone with Overpressure- McKenzie -  Repeat 10 Repetitions, Hold 1 Second(s), Perform 8 Times a Day OR Retraction Extension in Prone- McKenzie -  Repeat 10 Repetitions, Hold 1 Second(s), Perform 8 Times a Day    ASSESSMENT:  CLINICAL IMPRESSION: Patient continued with strengthening and mobility exercises with moderate to mild progression in muscle training load. Patient had some increased R shoulder discomfort not more than 2 points on the NPRS that returned  to baseline by end of exercise portion of treatment. He reported feeling no difference by end of exercise session prior to mechanical traction and better following mechanical traction. He continues to be limited by R shoulder, neck, and less so right periscapular pain. Patient prefers to push himself to the limit and will benefit from continued practice in self regulation. Patient would benefit from continued management of limiting condition by skilled physical therapist to address remaining impairments and functional limitations to work towards  stated goals and return to PLOF or maximal functional independence.    From initial PT evaluation on 07/23/2023:  Patient is a 68 y.o. male referred to outpatient physical therapy with a medical diagnosis of  cervicalgia, arthritis of right shoulder region, myofascial pain syndrome of thoracic spine who presents with signs and symptoms consistent with cervicalgia possibly radiating to R periscapular region, acute on chronic R shoulder pain s/p RTC repair in 2022. He has increased joint play and painful popping consistent with SLAP lesion as well as weak and painful ER and abduction suggesting rotator cuff related pain in the R shoulder. Patient presents with significant pain, joint stability, ROM, joint stiffness, muscle performance (strength/power/endurance), activity tolerance, and knowledge impairments that are limiting ability to complete everyday activities/tasks to some amount, working on projects, working on the computer, walking for exercise, sleeping, working in his shop, house maintenance and projects, doing as much as he wants to, regular exercise without difficulty. Patient will benefit from skilled physical therapy intervention to address current body structure impairments and activity limitations to improve function and work towards goals set in current POC in order to return to prior level of function or maximal functional improvement.    OBJECTIVE IMPAIRMENTS: decreased activity tolerance, decreased endurance, decreased knowledge of condition, decreased mobility, joint stiffness, joint instability, decreased ROM, decreased strength, hypomobility, impaired perceived functional ability, increased muscle spasms, impaired flexibility, impaired UE functional use, postural dysfunction, and pain.   ACTIVITY LIMITATIONS: carrying, lifting, bending, sitting, standing, squatting, sleeping, transfers, bed mobility, bathing, dressing, hygiene/grooming, reaching overhead, stabilizing with B UE, locomotion  level, and caring for others  PARTICIPATION LIMITATIONS: meal prep, cleaning, laundry, interpersonal relationship, driving, shopping, community activity, yard work, and  Advertising account executive, working on projects, working on [employer], walking for exercise, sleeping, working in his shop, house maintenance and projects, doing as much as he wants to, regular exercise  PERSONAL FACTORS: Age, Past/current experiences, Time since onset of injury/illness/exacerbation,behavior,  and 3+ comorbidities: depression, bilateral shoulder surgery (RTC repair, R most recent 12/2020), bilateral TKA, subarachnoid hemorrhage 2017 with SAH coiling, HTN, hx of hepatitis C (cured), double hernia repair, plantar facial fibromatosis, chronic pain syndrome, former smoker, Chronic bilateral low back pain with bilateral sciatica; Acute bursitis of right shoulder; Lumbar facet arthropathy; Lumbar degenerative disc disease; Localized osteoarthritis of shoulder regions, bilateral; Chronic SI joint pain; Chronic pain syndrome; Pain in joint of left shoulder; Abnormal gait; Anatomical narrow angle; Biomechanical lesion; Cataract nuclear; Derangement of posterior horn of medial meniscus; Edema; Glaucoma suspect of both eyes; Hip pain; History of total knee arthroplasty; Localized, primary osteoarthritis; Hx of subarachnoid hemophage, Muscle weakness; Arthritis; Plantar fascial fibromatosis; Viral hepatitis C; Arthropathy of left wrist; Chronic pain of left heel; Intercostal neuralgia; and Closed fracture of rib of left side with routine healing,  has a past medical history of Arthritis, Hepatitis, Hypertension, Subarachnoid hemorrhage (HCC) (2017), and Wears hearing aid in both ears.  has a past surgical history that includes Brain surgery; Joint replacement (Bilateral); Shoulder arthroscopy  with rotator cuff repair and open biceps tenodesis (Left, 09/08/2018); and Hernia repair (1963). Also had surgery on right shoulder are also  affecting patient's functional outcome.   REHAB POTENTIAL: Good   CLINICAL DECISION MAKING: Evolving/moderate complexity  EVALUATION COMPLEXITY: Moderate   GOALS: Goals reviewed with patient? No  SHORT TERM GOALS: Target date: 08/06/2023  Patient will be independent with initial home exercise program for self-management of symptoms. Baseline: Continue with HEP from last episode of care (07/23/23); Goal status: In progress  LONG TERM GOALS: Target date: 10/15/2023  Patient will be independent with a long-term home exercise program for self-management of symptoms.  Baseline: Continue with past HEP (07/23/23); Goal status: In progress  2.  Patient will demonstrate improved Upper Extremity Functional Scale (UEFS) to equal or greater than 64/80 to demonstrate improvement in overall condition and self-reported functional ability.  Baseline: to be measured at visit 2 as appropriate (07/23/23); Goal status: In progress  3.  Patient will successfully participate in a exercise program for health at least 3 days a week without exacerbation of symptoms.  Baseline: not participating and difficulty participating due to symptom exacerbation (07/23/23); Goal status: In progress  4.  R shoulder strength will improve at least 1/2 MMT grade or be 4+/5 or more without increased pain to improve his ability to work on projects, home maintenance, and a fitness program.  Baseline: weak and painful - see objective (07/23/23); Goal status: In progress  5.  Patient will demonstrate improvement in Patient Specific Functional Scale (PSFS) of equal or greater than 8/10 points for all three body parts to reflect clinically significant improvement in patient's most valued functional activities. Baseline: to be measured at visit 2 as appropriate (07/23/23); Goal status: In progress  6.  Patient will report NPRS equal or less than 3/10 during functional activities during the last 2 weeks to improve their  abilitly to complete community, work and/or recreational activities with less limitation. Baseline: to be measured visit 2 as appropriate (07/23/23); Goal status: In progress    PLAN:  PT FREQUENCY: 1-2x/week  PT DURATION: 8-12 weeks  PLANNED INTERVENTIONS: 97164- PT Re-evaluation, 97750- Physical Performance Testing, 97110-Therapeutic exercises, 97530- Therapeutic activity, [id number]- Neuromuscular re-education, 97535- Self Care, 16109- Manual therapy, [id number]- Electrical stimulation (unattended), [phone]- Traction (mechanical), 20560 (1-2 muscles), 20561 (3+ muscles)- Dry Needling, Patient/Family education, Joint mobilization, Spinal mobilization, Cryotherapy, and Moist heat.  PLAN FOR NEXT SESSION: update HEP as appropriate, graded/progressive postural/UE/functional strengthening, core strengthening, manual therapy as needed, mechanical traction. Gentle R shoulder stability exercises progressing to strengthening as tolerated.    Carilyn Charles. Artemio Larry, PT, DPT 08/05/23, 11:32 AM  [facility] [address] [place], [place] [address] P: [phone] I F: [phone]

## 2023-08-08 ENCOUNTER — Encounter: Payer: Self-pay | Admitting: Physical Therapy

## 2023-08-08 ENCOUNTER — Ambulatory Visit: Admitting: Physical Therapy

## 2023-08-08 DIAGNOSIS — M542 Cervicalgia: Secondary | ICD-10-CM

## 2023-08-08 DIAGNOSIS — M6281 Muscle weakness (generalized): Secondary | ICD-10-CM

## 2023-08-08 DIAGNOSIS — M25511 Pain in right shoulder: Secondary | ICD-10-CM

## 2023-08-08 DIAGNOSIS — M546 Pain in thoracic spine: Secondary | ICD-10-CM

## 2023-08-08 NOTE — Therapy (Signed)
 OUTPATIENT PHYSICAL THERAPY TREATMENT   Patient Name: Charles Mooney MRN: [id number] DOB:01/06/1956, 68 y.o., male Today's Date: 08/08/2023  END OF SESSION:  PT End of Session - 08/08/23 1321     Visit Number 6    Number of Visits 17    Date for PT Re-Evaluation 10/15/23    Authorization Type MEDICARE PART B reporting period from 07/23/2023    Progress Note Due on Visit 10    PT Start Time 1120    PT Stop Time 1210    PT Time Calculation (min) 50 min    Activity Tolerance Patient tolerated treatment well    Behavior During Therapy [facility] for tasks assessed/performed                  Past Medical History:  Diagnosis Date   Arthritis    Hepatitis    c 2000   Hypertension    Subarachnoid hemorrhage (HCC) 2017   Wears hearing aid in both ears    Past Surgical History:  Procedure Laterality Date   BRAIN SURGERY     [facility] COILING 2017   HERNIA REPAIR  1963   double hernia repair   JOINT REPLACEMENT Bilateral    TKR  L-2012, R-2015   SHOULDER ARTHROSCOPY WITH ROTATOR CUFF REPAIR AND OPEN BICEPS TENODESIS Left 09/08/2018   Procedure: LEFT SHOULDER ARTHROSCOPY,SUBSACAPULARIS REPAIR, SUBACROMIAL DECOMP, DISTAL CLAVICLE EXCISION,BICEP TENODESIS, MINI OPEN REGENTEN PATCH APPLICATION;  Surgeon: Lorri Rota, MD;  Location: [facility] ORS;  Service: Orthopedics;  Laterality: Left;   Patient Active Problem List   Diagnosis Date Noted   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   Closed fracture of rib of left side with routine healing 09/04/2022   Chronic pain of left heel 08/31/2021   Arthropathy of left wrist 10/12/2020   Viral hepatitis C 11/02/2019   Lumbar facet arthropathy 04/27/2019   Lumbar degenerative disc disease 04/27/2019   Localized osteoarthritis of shoulder regions, bilateral 04/27/2019   Encounter for long-term opiate analgesic use 04/27/2019   Chronic SI joint pain 04/27/2019   Chronic pain syndrome 04/27/2019   Pain in joint of left shoulder 06/03/2018   Abnormal gait  06/18/2017   Anatomical narrow angle 06/18/2017   Derangement of posterior horn of medial meniscus 06/18/2017   Edema 06/18/2017   Hip pain 06/18/2017   History of total knee arthroplasty 06/18/2017   Localized, primary osteoarthritis 06/18/2017   Muscle weakness 06/18/2017   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   Acute bursitis of right shoulder 06/14/2017   Nonallopathic lesion of sacral region 06/14/2017   Nonallopathic lesion of thoracic region 06/14/2017   Nonallopathic lesion of lumbosacral region 06/14/2017   Biomechanical lesion 06/14/2017   Chronic bilateral low back pain with bilateral sciatica 05/23/2017   Arthritis 04/19/2013   Cataract nuclear 06/16/2012   Glaucoma suspect of both eyes 06/16/2012    PCP: Rex Castor, MD  REFERRING PROVIDER: Cephus Collin, MD  REFERRING DIAG: cervicalgia, arthritis of right shoulder region, myofascial pain syndrome of thoracic spine  Rationale for Evaluation and Treatment: Rehabilitation  THERAPY DIAG:  Right shoulder pain, unspecified chronicity  Cervicalgia  Muscle weakness (generalized)  Pain in thoracic spine  ONSET DATE: Neck pain and shoulder pain worsened since January 2025, October 2024 for right scapular pain (approximately 3 months after rib fracture in July 2024)  SUBJECTIVE:  PERTINENT HISTORY:  Patient is a 68 y.o. male who presents to outpatient physical therapy with a referral for medical diagnosis of cervicalgia, arthritis of right shoulder region, myofascial pain syndrome of thoracic spine. This patient's chief complaints consist of acute on chronic R shoulder pain and popping, neck pain, and R shoulder pain leading to the following functional deficits: limits him with everyday activities/tasks to some amount, working on  projects, working on the computer, walking for exercise, sleeping, working in his shop, house maintenance and projects, doing as much as he wants to, regular exercise.   Relevant past medical history and comorbidities include bilateral shoulder surgery (RTC repair, R most recent 12/2020) and subarachnoid hemorrhage in 2017, depression. Patient denies hx of cancer, seizures, unexplained weight loss, unexplained changes in bowel or bladder problems, unexplained stumbling or dropping things, back surgery.   Exercise history: he used to do bench presses, flies, bent over rows, lat pull in front and behind head, curls, triceps extensions overhead. He worked out at home 2-3/x a week but he always struggled to be consistent. 45-60 minutes. He stopped approx 10 years ago when he started building his current house. Then he had a stroke and then it was all he could do to work. He did not have any extra energy or willpower at that point. He is currently doing all of this for his grandkids.   He struggles with depression, Sees someone for talk therapy 1x a month (50 minutes) with the same person. He is limited by the fact he has to pay out of pocket. He takes celexa and his GP switched him to Wellbutrin. He did not like this so he went back to celexa. He has not tried any other medications for that. He had hepatitis C in the early 90s. He was put on interferon and rivovinan. That caused some really serious depression and was put on something then but cannot remember what it was (it might have been amitriptyline, and it was wild). He feels his depression got fully blown and noticeable about 3 years ago, around the time he got his shoulder done. At that time he made a decision to move at that time and it really triggered it because he built the house he is in now and he had planned to retire there. He has not moved yet, but he is currently packing. He also feels like his depression is also related to not being in shape. He  thinks if he could get his pain and physical conditioning under control, his depression would be much better.    SUBJECTIVE STATEMENT: Patient states his neck was much stiffer and painful when he was walking this morning than it is now. He took a pain pill right after he got back from the walk. He thinks the neck is getting a lot better but he still can't get below that 3/10. R shoulder has been improving. He cannot remember the last time it really got painful, which is usually at least once a day. R periscapular pain continues to stay constant.    PAIN: Are you having pain? Yes;  NPRS: 3/10 center neck and constant, 3/10 R anterior shoulder, 3/10 R periscapular region     FUNCTIONAL LIMITATIONS:limits him with everyday activities/tasks to some amount, working on projects, working on the computer, walking for exercise, sleeping, working in his shop, house maintenance and projects, doing as much as he wants to, regular exercise  LEISURE: working in the shop, building and fixing things  PRECAUTIONS:  None   WEIGHT BEARING RESTRICTIONS: No   PLOF: Independent  PATIENT GOALS: to get rid of the pain, to get on an exercise and stretching regimen for his overall health, sleep with less difficulty from pain.  Next MD appointment: June or July with Dr. Rhesa Celeste  OBJECTIVE   1RM TESTING Bent Over Row with contralateral UE braced (last tested 03/27/2023): 1 RM for Right Arm: 75.8  1 RM for Left Arm: 80.2   Bent Over Shoulder Scaption (Y):  (last tested 03/27/2023): 1 RM for Right Arm: 8.9  1 RM for Left Arm: 9.1   TREATMENT                                                                                                                           Therapeutic exercise: therapeutic exercises that incorporate ONE parameter at one or more areas of the body to centralize symptoms, develop strength and endurance, range of motion, and flexibility required for successful completion of functional  activities.   UBE L10, 3 min forward, 3 min backward.   Seated cervical spine rotational SNAG with strap 2x12 each side   Seated cervical retractions into blue ball on wall for resistance:  3x12   Sidelying L shoulder ER 3x15 with 1#DB mild increase in pain, fatiguing R knee on mat to prevent compensatory roll Towel roll under L elbow  Seated short arc R shoulder scaption (~80-120 deg) 1x3 easy Tried with 10#DB (cannot lift off mat) Tried with 4#DB (painful/stiff) Regressed to AROM (still difficult and with some pain).  1x max AROM (10?) 1x max AROM short lever (elbow bent)  Hooklying pec stretch and shoulder angels while laying on half foam roll along entire spine.  2G40 with 2#DB  After each set of chest press  Education on HEP including handout and provided small children's ball for home use with cervical retraction.    Therapeutic activities: dynamic therapeutic activities incorporating MULTIPLE parameters or areas of the body designed to achieve improved functional performance.  Hookling DB chest press with double plus while laying on half foam roll along spine (for improved pushing, working, exercising) 3x12 with 20#DB in each hand  R shoulder pain up to 4/10 during  Superset:  Inclined B shoulder scaption on [employer] with gentle cervical spine retraction to improve posterior chain strength and coordination for reaching 1x12 with 1#DB 2x12 with 2#DBs  Seated OMEGA Lat pull down (for improved pulling): 3x12 at 55#  Superset:  Standing row with scap retractions at [facility] (for improved pulling): 3x12 at 45#   Pt required multimodal cuing for proper technique and to facilitate improved neuromuscular control, strength, range of motion, and functional ability resulting in improved performance and form.    PATIENT EDUCATION:  Education details: Exercise purpose/form. Self management techniques. Education on diagnosis, prognosis, POC, anatomy and physiology of  current condition. Education on HEP including handout. Person educated: Patient Education method: Explanation, Demonstration Education comprehension: verbalized understanding and needs further education  HOME EXERCISE PROGRAM: Access Code: [facility] URL: [email or web address] Date: 08/08/2023 Prepared by: Alleen [place]  Exercises - Cervical SNAG Rotation  - 3 x weekly - 2 sets - 12 reps - Standing Isometric Cervical Retraction with Chin Tucks and Ball at [employer]  - 3 x weekly - 3 sets - 12 reps - Supine Chest Press with Dumbbells  - 3 x weekly - 3 sets - 12 reps - [employer] on Foam Roll  - 3 x weekly - 3 sets - 12 reps - Sidelying Shoulder ER with Towel and Dumbbell  - 3 x weekly - 3 sets - 15 reps - Seated Single Arm Shoulder Scaption  - 3 x weekly - 3 sets - 15 reps - Lat Pull Down - Cable/Bar  - 3 x weekly - 3 sets - 12 reps - Row with band/cable  - 3 x weekly - 3 sets - 12 reps - Prone Lower Trapezius Strengthening on Swiss Ball with Dumbbells  - 3 x weekly - 3 sets - 12 reps    ASSESSMENT:  CLINICAL IMPRESSION: Today's session continued focus on shoulder girdle strengthening with some exercises for spine mobility and postural strength. Updated HEP today to reinforce current exercises in the clinic. Patient with very limited right shoulder abduction strength and muscular endurance. HE was unable to perform 15 reps AROM scaption due to quick fatigue. Patient reported feeling slightly better by end of session. Patient would benefit from continued management of limiting condition by skilled physical therapist to address remaining impairments and functional limitations to work towards stated goals and return to PLOF or maximal functional independence.   From initial PT evaluation on 07/23/2023:  Patient is a 68 y.o. male referred to outpatient physical therapy with a medical diagnosis of  cervicalgia, arthritis of right shoulder region, myofascial pain syndrome of thoracic spine  who presents with signs and symptoms consistent with cervicalgia possibly radiating to R periscapular region, acute on chronic R shoulder pain s/p RTC repair in 2022. He has increased joint play and painful popping consistent with SLAP lesion as well as weak and painful ER and abduction suggesting rotator cuff related pain in the R shoulder. Patient presents with significant pain, joint stability, ROM, joint stiffness, muscle performance (strength/power/endurance), activity tolerance, and knowledge impairments that are limiting ability to complete everyday activities/tasks to some amount, working on projects, working on the computer, walking for exercise, sleeping, working in his shop, house maintenance and projects, doing as much as he wants to, regular exercise without difficulty. Patient will benefit from skilled physical therapy intervention to address current body structure impairments and activity limitations to improve function and work towards goals set in current POC in order to return to prior level of function or maximal functional improvement.    OBJECTIVE IMPAIRMENTS: decreased activity tolerance, decreased endurance, decreased knowledge of condition, decreased mobility, joint stiffness, joint instability, decreased ROM, decreased strength, hypomobility, impaired perceived functional ability, increased muscle spasms, impaired flexibility, impaired UE functional use, postural dysfunction, and pain.   ACTIVITY LIMITATIONS: carrying, lifting, bending, sitting, standing, squatting, sleeping, transfers, bed mobility, bathing, dressing, hygiene/grooming, reaching overhead, stabilizing with B UE, locomotion level, and caring for others  PARTICIPATION LIMITATIONS: meal prep, cleaning, laundry, interpersonal relationship, driving, shopping, community activity, yard work, and  Advertising account executive, working on projects, working on [employer], walking for exercise, sleeping, working in his shop, house  maintenance and projects, doing as much as he wants to, regular exercise  PERSONAL FACTORS: Age, Past/current experiences, Time since onset  of injury/illness/exacerbation,behavior,  and 3+ comorbidities: depression, bilateral shoulder surgery (RTC repair, R most recent 12/2020), bilateral TKA, subarachnoid hemorrhage 2017 with SAH coiling, HTN, hx of hepatitis C (cured), double hernia repair, plantar facial fibromatosis, chronic pain syndrome, former smoker, Chronic bilateral low back pain with bilateral sciatica; Acute bursitis of right shoulder; Lumbar facet arthropathy; Lumbar degenerative disc disease; Localized osteoarthritis of shoulder regions, bilateral; Chronic SI joint pain; Chronic pain syndrome; Pain in joint of left shoulder; Abnormal gait; Anatomical narrow angle; Biomechanical lesion; Cataract nuclear; Derangement of posterior horn of medial meniscus; Edema; Glaucoma suspect of both eyes; Hip pain; History of total knee arthroplasty; Localized, primary osteoarthritis; Hx of subarachnoid hemophage, Muscle weakness; Arthritis; Plantar fascial fibromatosis; Viral hepatitis C; Arthropathy of left wrist; Chronic pain of left heel; Intercostal neuralgia; and Closed fracture of rib of left side with routine healing,  has a past medical history of Arthritis, Hepatitis, Hypertension, Subarachnoid hemorrhage (HCC) (2017), and Wears hearing aid in both ears.  has a past surgical history that includes Brain surgery; Joint replacement (Bilateral); Shoulder arthroscopy with rotator cuff repair and open biceps tenodesis (Left, 09/08/2018); and Hernia repair (1963). Also had surgery on right shoulder are also affecting patient's functional outcome.   REHAB POTENTIAL: Good   CLINICAL DECISION MAKING: Evolving/moderate complexity  EVALUATION COMPLEXITY: Moderate   GOALS: Goals reviewed with patient? No  SHORT TERM GOALS: Target date: 08/06/2023  Patient will be independent with initial home exercise  program for self-management of symptoms. Baseline: Continue with HEP from last episode of care (07/23/23); Goal status: In progress  LONG TERM GOALS: Target date: 10/15/2023  Patient will be independent with a long-term home exercise program for self-management of symptoms.  Baseline: Continue with past HEP (07/23/23); Goal status: In progress  2.  Patient will demonstrate improved Upper Extremity Functional Scale (UEFS) to equal or greater than 64/80 to demonstrate improvement in overall condition and self-reported functional ability.  Baseline: to be measured at visit 2 as appropriate (07/23/23); Goal status: In progress  3.  Patient will successfully participate in a exercise program for health at least 3 days a week without exacerbation of symptoms.  Baseline: not participating and difficulty participating due to symptom exacerbation (07/23/23); Goal status: In progress  4.  R shoulder strength will improve at least 1/2 MMT grade or be 4+/5 or more without increased pain to improve his ability to work on projects, home maintenance, and a fitness program.  Baseline: weak and painful - see objective (07/23/23); Goal status: In progress  5.  Patient will demonstrate improvement in Patient Specific Functional Scale (PSFS) of equal or greater than 8/10 points for all three body parts to reflect clinically significant improvement in patient's most valued functional activities. Baseline: to be measured at visit 2 as appropriate (07/23/23); Goal status: In progress  6.  Patient will report NPRS equal or less than 3/10 during functional activities during the last 2 weeks to improve their abilitly to complete community, work and/or recreational activities with less limitation. Baseline: to be measured visit 2 as appropriate (07/23/23); Goal status: In progress    PLAN:  PT FREQUENCY: 1-2x/week  PT DURATION: 8-12 weeks  PLANNED INTERVENTIONS: 97164- PT Re-evaluation, 97750- Physical  Performance Testing, 97110-Therapeutic exercises, 97530- Therapeutic activity, [id number]- Neuromuscular re-education, 97535- Self Care, [address]- Manual therapy, [id number]- Electrical stimulation (unattended), [phone]- Traction (mechanical), 20560 (1-2 muscles), 20561 (3+ muscles)- Dry Needling, Patient/Family education, Joint mobilization, Spinal mobilization, Cryotherapy, and Moist heat.  PLAN FOR NEXT SESSION: update HEP as  appropriate, graded/progressive postural/UE/functional strengthening, core strengthening, manual therapy as needed, mechanical traction. Gentle R shoulder stability exercises progressing to strengthening as tolerated.    Carilyn Charles. Artemio Larry, PT, DPT 08/08/23, 1:24 PM  [facility] [address], [place] [address] P: [phone] I F: [phone]

## 2023-08-13 ENCOUNTER — Encounter: Payer: Self-pay | Admitting: Physical Therapy

## 2023-08-13 ENCOUNTER — Ambulatory Visit: Admitting: Physical Therapy

## 2023-08-13 DIAGNOSIS — M542 Cervicalgia: Secondary | ICD-10-CM

## 2023-08-13 DIAGNOSIS — M25511 Pain in right shoulder: Secondary | ICD-10-CM | POA: Diagnosis not present

## 2023-08-13 DIAGNOSIS — M6281 Muscle weakness (generalized): Secondary | ICD-10-CM

## 2023-08-13 DIAGNOSIS — M546 Pain in thoracic spine: Secondary | ICD-10-CM

## 2023-08-13 NOTE — Therapy (Signed)
 OUTPATIENT PHYSICAL THERAPY TREATMENT   Patient Name: Charles Mooney MRN: [id number] DOB:1956-02-18, 68 y.o., male Today's Date: 08/13/2023  END OF SESSION:  PT End of Session - 08/13/23 0953     Visit Number 7    Number of Visits 17    Date for PT Re-Evaluation 10/15/23    Authorization Type MEDICARE PART B reporting period from 07/23/2023    Progress Note Due on Visit 10    PT Start Time 0953    PT Stop Time 1050    PT Time Calculation (min) 57 min    Activity Tolerance Patient tolerated treatment well    Behavior During Therapy [facility] for tasks assessed/performed          Past Medical History:  Diagnosis Date   Arthritis    Hepatitis    c 2000   Hypertension    Subarachnoid hemorrhage (HCC) 2017   Wears hearing aid in both ears    Past Surgical History:  Procedure Laterality Date   BRAIN SURGERY     [facility] COILING 2017   HERNIA REPAIR  1963   double hernia repair   JOINT REPLACEMENT Bilateral    TKR  L-2012, R-2015   SHOULDER ARTHROSCOPY WITH ROTATOR CUFF REPAIR AND OPEN BICEPS TENODESIS Left 09/08/2018   Procedure: LEFT SHOULDER ARTHROSCOPY,SUBSACAPULARIS REPAIR, SUBACROMIAL DECOMP, DISTAL CLAVICLE EXCISION,BICEP TENODESIS, MINI OPEN REGENTEN PATCH APPLICATION;  Surgeon: Tobie Priest, MD;  Location: [facility] ORS;  Service: Orthopedics;  Laterality: Left;   Patient Active Problem List   Diagnosis Date Noted   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   Closed fracture of rib of left side with routine healing 09/04/2022   Chronic pain of left heel 08/31/2021   Arthropathy of left wrist 10/12/2020   Viral hepatitis C 11/02/2019   Lumbar facet arthropathy 04/27/2019   Lumbar degenerative disc disease 04/27/2019   Localized osteoarthritis of shoulder regions, bilateral 04/27/2019   Encounter for long-term opiate analgesic use 04/27/2019   Chronic SI joint pain 04/27/2019   Chronic pain syndrome 04/27/2019   Pain in joint of left shoulder 06/03/2018   Abnormal gait 06/18/2017    Anatomical narrow angle 06/18/2017   Derangement of posterior horn of medial meniscus 06/18/2017   Edema 06/18/2017   Hip pain 06/18/2017   History of total knee arthroplasty 06/18/2017   Localized, primary osteoarthritis 06/18/2017   Muscle weakness 06/18/2017   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   Acute bursitis of right shoulder 06/14/2017   Nonallopathic lesion of sacral region 06/14/2017   Nonallopathic lesion of thoracic region 06/14/2017   Nonallopathic lesion of lumbosacral region 06/14/2017   Biomechanical lesion 06/14/2017   Chronic bilateral low back pain with bilateral sciatica 05/23/2017   Arthritis 04/19/2013   Cataract nuclear 06/16/2012   Glaucoma suspect of both eyes 06/16/2012    PCP: Sherial Bail, MD  REFERRING PROVIDER: Marcelino Nurse, MD  REFERRING DIAG: cervicalgia, arthritis of right shoulder region, myofascial pain syndrome of thoracic spine  Rationale for Evaluation and Treatment: Rehabilitation  THERAPY DIAG:  Right shoulder pain, unspecified chronicity  Cervicalgia  Muscle weakness (generalized)  Pain in thoracic spine  ONSET DATE: Neck pain and shoulder pain worsened since January 2025, October 2024 for right scapular pain (approximately 3 months after rib fracture in July 2024)  SUBJECTIVE:  PERTINENT HISTORY:  Patient is a 68 y.o. male who presents to outpatient physical therapy with a referral for medical diagnosis of cervicalgia, arthritis of right shoulder region, myofascial pain syndrome of thoracic spine. This patient's chief complaints consist of acute on chronic R shoulder pain and popping, neck pain, and R shoulder pain leading to the following functional deficits: limits him with everyday activities/tasks to some amount, working on projects,  working on the computer, walking for exercise, sleeping, working in his shop, house maintenance and projects, doing as much as he wants to, regular exercise.   Relevant past medical history and comorbidities include bilateral shoulder surgery (RTC repair, R most recent 12/2020) and subarachnoid hemorrhage in 2017, depression. Patient denies hx of cancer, seizures, unexplained weight loss, unexplained changes in bowel or bladder problems, unexplained stumbling or dropping things, back surgery.   Exercise history: he used to do bench presses, flies, bent over rows, lat pull in front and behind head, curls, triceps extensions overhead. He worked out at home 2-3/x a week but he always struggled to be consistent. 45-60 minutes. He stopped approx 10 years ago when he started building his current house. Then he had a stroke and then it was all he could do to work. He did not have any extra energy or willpower at that point. He is currently doing all of this for his grandkids.   He struggles with depression, Sees someone for talk therapy 1x a month (50 minutes) with the same person. He is limited by the fact he has to pay out of pocket. He takes celexa and his GP switched him to Wellbutrin. He did not like this so he went back to celexa. He has not tried any other medications for that. He had hepatitis C in the early 90s. He was put on interferon and rivovinan. That caused some really serious depression and was put on something then but cannot remember what it was (it might have been amitriptyline, and it was wild). He feels his depression got fully blown and noticeable about 3 years ago, around the time he got his shoulder done. At that time he made a decision to move at that time and it really triggered it because he built the house he is in now and he had planned to retire there. He has not moved yet, but he is currently packing. He also feels like his depression is also related to not being in shape. He thinks if  he could get his pain and physical conditioning under control, his depression would be much better.    SUBJECTIVE STATEMENT: Patient states he is feeling good because he did his exercises on Saturday and he has been more active in general. He also bought a house, which he does like but not as much as the house he is in.  He would like to do traction today. Patient felt good after last PT session.    PAIN: Are you having pain? Yes;  NPRS: 3.5/10 center neck and constant, 3/10 R anterior shoulder, 4/10 R periscapular region     FUNCTIONAL LIMITATIONS:limits him with everyday activities/tasks to some amount, working on projects, working on the computer, walking for exercise, sleeping, working in his shop, house maintenance and projects, doing as much as he wants to, regular exercise  LEISURE: working in the shop, building and fixing things  PRECAUTIONS: None   WEIGHT BEARING RESTRICTIONS: No   PLOF: Independent  PATIENT GOALS: to get rid of the pain, to get on an  exercise and stretching regimen for his overall health, sleep with less difficulty from pain.  Next MD appointment: June or July with Dr. Marcelino  OBJECTIVE   1RM TESTING Bent Over Row with contralateral UE braced (last tested 03/27/2023): 1 RM for Right Arm: 75.8  1 RM for Left Arm: 80.2   Bent Over Shoulder Scaption (Y):  (last tested 03/27/2023): 1 RM for Right Arm: 8.9  1 RM for Left Arm: 9.1   TREATMENT                                                                                                                           Therapeutic exercise: therapeutic exercises that incorporate ONE parameter at one or more areas of the body to centralize symptoms, develop strength and endurance, range of motion, and flexibility required for successful completion of functional activities.   UBE L10, 3 min forward, 3 min backward.   Seated cervical spine rotational SNAG with strap 2x12 each side   Seated cervical  retractions into blue ball on wall for resistance:  3x12   Sidelying L shoulder ER 3x12 with 1#DB fatiguing R knee on mat to prevent compensatory roll Towel roll under L elbow  Hooklying pec stretch and shoulder angels while laying on half foam roll along entire spine.  [id number] with 2#DB  After each set of chest press   Therapeutic activities: dynamic therapeutic activities incorporating MULTIPLE parameters or areas of the body designed to achieve improved functional performance.  Hookling DB chest press with double plus while laying on half foam roll along spine (for improved pushing, working, exercising) 3x12 with 20#DB in each hand   Superset:  Inclined B shoulder scaption on [employer] with gentle cervical spine retraction to improve posterior chain strength and coordination for reaching 3x12 with 2#DBs  Seated OMEGA Lat pull down (for improved pulling): 3x12 at 55#  Superset:  Seated short arc R shoulder scaption (~80-120 deg) 1x15 AROM  2x12 AROM Superset with Rows (below)  Standing row with scap retractions at [facility] (for improved pulling): 3x12 at 45#   Pt required multimodal cuing for proper technique and to facilitate improved neuromuscular control, strength, range of motion, and functional ability resulting in improved performance and form.  Mechanical Traction: to improve cervical spine and thoracic pain, and help decrease muscle tension.  Type: Cervical Min (lb): 0 Max (lb): 35  Hold time: 30 seconds Rest time: 10 seconds Pull angle: ~20 degrees Time: 15 min  plus set up and take down Result:  good tolerance during, no complaints after, decreased neck pain.     PATIENT EDUCATION:  Education details: Exercise purpose/form. Self management techniques. Education on diagnosis, prognosis, POC, anatomy and physiology of current condition. Education on HEP including handout. Person educated: Patient Education method: Explanation, Demonstration Education  comprehension: verbalized understanding and needs further education  HOME EXERCISE PROGRAM: Access Code: [facility] URL: [email or web address] Date: 08/08/2023 Prepared by: Camie Cleverly  Exercises - Cervical SNAG Rotation  -  3 x weekly - 2 sets - 12 reps - Standing Isometric Cervical Retraction with Chin Tucks and Ball at [employer]  - 3 x weekly - 3 sets - 12 reps - Supine Chest Press with Dumbbells  - 3 x weekly - 3 sets - 12 reps - [place] on Foam Roll  - 3 x weekly - 3 sets - 12 reps - Sidelying Shoulder ER with Towel and Dumbbell  - 3 x weekly - 3 sets - 15 reps - Seated Single Arm Shoulder Scaption  - 3 x weekly - 3 sets - 15 reps - Lat Pull Down - Cable/Bar  - 3 x weekly - 3 sets - 12 reps - Row with band/cable  - 3 x weekly - 3 sets - 12 reps - Prone Lower Trapezius Strengthening on Swiss Ball with Dumbbells  - 3 x weekly - 3 sets - 12 reps    ASSESSMENT:  CLINICAL IMPRESSION: Patient with good tolerance to today's session which continued to build on prior sessions. Patient continues to have weakness and pain that limitis him and needs guildance for exercise selection and performance. Patient would benefit from continued management of limiting condition by skilled physical therapist to address remaining impairments and functional limitations to work towards stated goals and return to PLOF or maximal functional independence.    From initial PT evaluation on 07/23/2023:  Patient is a 68 y.o. male referred to outpatient physical therapy with a medical diagnosis of  cervicalgia, arthritis of right shoulder region, myofascial pain syndrome of thoracic spine who presents with signs and symptoms consistent with cervicalgia possibly radiating to R periscapular region, acute on chronic R shoulder pain s/p RTC repair in 2022. He has increased joint play and painful popping consistent with SLAP lesion as well as weak and painful ER and abduction suggesting rotator cuff related pain in  the R shoulder. Patient presents with significant pain, joint stability, ROM, joint stiffness, muscle performance (strength/power/endurance), activity tolerance, and knowledge impairments that are limiting ability to complete everyday activities/tasks to some amount, working on projects, working on the computer, walking for exercise, sleeping, working in his shop, house maintenance and projects, doing as much as he wants to, regular exercise without difficulty. Patient will benefit from skilled physical therapy intervention to address current body structure impairments and activity limitations to improve function and work towards goals set in current POC in order to return to prior level of function or maximal functional improvement.    OBJECTIVE IMPAIRMENTS: decreased activity tolerance, decreased endurance, decreased knowledge of condition, decreased mobility, joint stiffness, joint instability, decreased ROM, decreased strength, hypomobility, impaired perceived functional ability, increased muscle spasms, impaired flexibility, impaired UE functional use, postural dysfunction, and pain.   ACTIVITY LIMITATIONS: carrying, lifting, bending, sitting, standing, squatting, sleeping, transfers, bed mobility, bathing, dressing, hygiene/grooming, reaching overhead, stabilizing with B UE, locomotion level, and caring for others  PARTICIPATION LIMITATIONS: meal prep, cleaning, laundry, interpersonal relationship, driving, shopping, community activity, yard work, and  Advertising account executive, working on projects, working on [employer], walking for exercise, sleeping, working in his shop, house maintenance and projects, doing as much as he wants to, regular exercise  PERSONAL FACTORS: Age, Past/current experiences, Time since onset of injury/illness/exacerbation,behavior,  and 3+ comorbidities: depression, bilateral shoulder surgery (RTC repair, R most recent 12/2020), bilateral TKA, subarachnoid hemorrhage 2017  with SAH coiling, HTN, hx of hepatitis C (cured), double hernia repair, plantar facial fibromatosis, chronic pain syndrome, former smoker, Chronic bilateral low back pain with bilateral sciatica;  Acute bursitis of right shoulder; Lumbar facet arthropathy; Lumbar degenerative disc disease; Localized osteoarthritis of shoulder regions, bilateral; Chronic SI joint pain; Chronic pain syndrome; Pain in joint of left shoulder; Abnormal gait; Anatomical narrow angle; Biomechanical lesion; Cataract nuclear; Derangement of posterior horn of medial meniscus; Edema; Glaucoma suspect of both eyes; Hip pain; History of total knee arthroplasty; Localized, primary osteoarthritis; Hx of subarachnoid hemophage, Muscle weakness; Arthritis; Plantar fascial fibromatosis; Viral hepatitis C; Arthropathy of left wrist; Chronic pain of left heel; Intercostal neuralgia; and Closed fracture of rib of left side with routine healing,  has a past medical history of Arthritis, Hepatitis, Hypertension, Subarachnoid hemorrhage (HCC) (2017), and Wears hearing aid in both ears.  has a past surgical history that includes Brain surgery; Joint replacement (Bilateral); Shoulder arthroscopy with rotator cuff repair and open biceps tenodesis (Left, 09/08/2018); and Hernia repair (1963). Also had surgery on right shoulder are also affecting patient's functional outcome.   REHAB POTENTIAL: Good   CLINICAL DECISION MAKING: Evolving/moderate complexity  EVALUATION COMPLEXITY: Moderate   GOALS: Goals reviewed with patient? No  SHORT TERM GOALS: Target date: 08/06/2023  Patient will be independent with initial home exercise program for self-management of symptoms. Baseline: Continue with HEP from last episode of care (07/23/23); Goal status: In progress  LONG TERM GOALS: Target date: 10/15/2023  Patient will be independent with a long-term home exercise program for self-management of symptoms.  Baseline: Continue with past HEP  (07/23/23); Goal status: In progress  2.  Patient will demonstrate improved Upper Extremity Functional Scale (UEFS) to equal or greater than 64/80 to demonstrate improvement in overall condition and self-reported functional ability.  Baseline: to be measured at visit 2 as appropriate (07/23/23); Goal status: In progress  3.  Patient will successfully participate in a exercise program for health at least 3 days a week without exacerbation of symptoms.  Baseline: not participating and difficulty participating due to symptom exacerbation (07/23/23); Goal status: In progress  4.  R shoulder strength will improve at least 1/2 MMT grade or be 4+/5 or more without increased pain to improve his ability to work on projects, home maintenance, and a fitness program.  Baseline: weak and painful - see objective (07/23/23); Goal status: In progress  5.  Patient will demonstrate improvement in Patient Specific Functional Scale (PSFS) of equal or greater than 8/10 points for all three body parts to reflect clinically significant improvement in patient's most valued functional activities. Baseline: to be measured at visit 2 as appropriate (07/23/23); Goal status: In progress  6.  Patient will report NPRS equal or less than 3/10 during functional activities during the last 2 weeks to improve their abilitly to complete community, work and/or recreational activities with less limitation. Baseline: to be measured visit 2 as appropriate (07/23/23); Goal status: In progress    PLAN:  PT FREQUENCY: 1-2x/week  PT DURATION: 8-12 weeks  PLANNED INTERVENTIONS: 97164- PT Re-evaluation, 97750- Physical Performance Testing, 97110-Therapeutic exercises, 97530- Therapeutic activity, [id number]- Neuromuscular re-education, 97535- Self Care, 02859- Manual therapy, [id number]- Electrical stimulation (unattended), [phone]- Traction (mechanical), 20560 (1-2 muscles), 20561 (3+ muscles)- Dry Needling, Patient/Family education, Joint  mobilization, Spinal mobilization, Cryotherapy, and Moist heat.  PLAN FOR NEXT SESSION: update HEP as appropriate, graded/progressive postural/UE/functional strengthening, core strengthening, manual therapy as needed, mechanical traction. Gentle R shoulder stability exercises progressing to strengthening as tolerated.    Camie SAUNDERS. Juli, PT, DPT 08/13/23, 3:06 PM  [facility] [address] [place], [place] [address] P:  [phone] I F: [phone]

## 2023-08-14 ENCOUNTER — Ambulatory Visit: Attending: Nurse Practitioner | Admitting: Nurse Practitioner

## 2023-08-14 ENCOUNTER — Encounter: Payer: Self-pay | Admitting: Nurse Practitioner

## 2023-08-14 VITALS — BP 138/85 | HR 75 | Temp 97.9°F | Ht 72.0 in | Wt 220.0 lb

## 2023-08-14 DIAGNOSIS — M79672 Pain in left foot: Secondary | ICD-10-CM | POA: Insufficient documentation

## 2023-08-14 DIAGNOSIS — M19012 Primary osteoarthritis, left shoulder: Secondary | ICD-10-CM | POA: Diagnosis present

## 2023-08-14 DIAGNOSIS — M47816 Spondylosis without myelopathy or radiculopathy, lumbar region: Secondary | ICD-10-CM | POA: Diagnosis present

## 2023-08-14 DIAGNOSIS — M542 Cervicalgia: Secondary | ICD-10-CM | POA: Diagnosis not present

## 2023-08-14 DIAGNOSIS — G8929 Other chronic pain: Secondary | ICD-10-CM | POA: Diagnosis present

## 2023-08-14 DIAGNOSIS — G588 Other specified mononeuropathies: Secondary | ICD-10-CM | POA: Diagnosis present

## 2023-08-14 DIAGNOSIS — M533 Sacrococcygeal disorders, not elsewhere classified: Secondary | ICD-10-CM | POA: Diagnosis present

## 2023-08-14 DIAGNOSIS — S2232XD Fracture of one rib, left side, subsequent encounter for fracture with routine healing: Secondary | ICD-10-CM | POA: Diagnosis present

## 2023-08-14 DIAGNOSIS — M19011 Primary osteoarthritis, right shoulder: Secondary | ICD-10-CM | POA: Diagnosis present

## 2023-08-14 DIAGNOSIS — Z79891 Long term (current) use of opiate analgesic: Secondary | ICD-10-CM | POA: Insufficient documentation

## 2023-08-14 DIAGNOSIS — M7918 Myalgia, other site: Secondary | ICD-10-CM | POA: Diagnosis present

## 2023-08-14 DIAGNOSIS — G894 Chronic pain syndrome: Secondary | ICD-10-CM | POA: Diagnosis present

## 2023-08-14 MED ORDER — OXYCODONE-ACETAMINOPHEN 7.5-325 MG PO TABS: 1.0000 | ORAL_TABLET | Freq: Three times a day (TID) | ORAL | 0 refills | Status: DC | PRN

## 2023-08-14 NOTE — Progress Notes (Signed)
 PROVIDER NOTE: Interpretation of information contained herein should be left to medically-trained personnel. Specific patient instructions are provided elsewhere under Patient Instructions section of medical record. This document was created in part using AI and STT-dictation technology, any transcriptional errors that may result from this process are unintentional.  Patient: Charles Mooney  Service: E/M   PCP: Sherial Bail, MD  DOB: 27-Jan-1956  DOS: 08/14/2023  Provider: Emmy MARLA Blanch, NP  MRN: [id number]  Delivery: Face-to-face  Specialty: Interventional Pain Management  Type: Established Patient  Setting: Ambulatory outpatient facility  Specialty designation: 09  Referring Prov.: Sherial Bail, MD  Location: Outpatient office facility       History of present illness (HPI) Charles Mooney, a 68 y.o. year old male, is here today because of his Chronic pain syndrome [G89.4]. Charles Mooney's primary complain today is Shoulder Pain (Right shoulder, neck, back and bilateral foot pain)  Pertinent problems: Charles Mooney has Chronic bilateral low back pain with bilateral sciatica; Acute bursitis of right shoulder; Nonallopathic lesion of sacral region; Lumbar facet arthropathy; Lumbar degenerative disc disease; Localized osteoarthritis of shoulder regions, Bilateral; Encounter for long-term opioid analgesics use; Chronic SI joint pain; and Chronic pain syndrome on their pertinent problem list.  Pain Assessment: Severity of Chronic pain is reported as a 4 /10. Location: Shoulder Right/back pain radiates down left leg to left knee. Onset: More than a month ago. Quality: Aching. Timing: Constant. Modifying factor(s): meds, ice, heat, stretching, PT. Vitals:  height is 6' (1.829 m) and weight is 220 lb (99.8 kg). His temperature is 97.9 F (36.6 C). His blood pressure is 138/85 and his pulse is 75. His oxygen saturation is 97%.  BMI: Estimated body mass index is 29.84 kg/m as calculated from the  following:   Height as of this encounter: 6' (1.829 m).   Weight as of this encounter: 220 lb (99.8 kg).  Last encounter: 05/23/2023 Last procedure: 03/04/2023  Reason for encounter: medication management. No change in medical history since last visit.  Patient's pain is at baseline.  Patient continues multimodal pain regimen as prescribed.  States that it provides pain relief and improvement in functional status.   He continues to experience chronic right shoulder pain despite undergoing physical therapy, although there is some improvement in range of motion and stability due to physical therapy.  He also has experienced a significant worsening of neck pain and chronic bilateral foot pain.   Due to significant and persistent pain, the patient had been taking more medication than prescribed and requested an increase in frequency.  After a detailed conversation, I educate the patient about the risk of narcotics tolerance and the importance of compliance with the prescribed regimen.  We agreed to adjust the medication to Percocet 7.5-325 mg every 8 hours as needed, limited to a 30-day supply.   Pharmacotherapy Assessment   Analgesic: Oxycodone -acetaminophen  (Percocet) 7.5-325 mg tablet every 8 hours as needed for pain. MME=30 Monitoring: [place] PMP: PDMP reviewed during this encounter.       Pharmacotherapy: No side-effects or adverse reactions reported. Compliance: No problems identified. Effectiveness: Clinically acceptable.  Margrette Nathanel PARAS, RN  08/14/2023  9:15 AM  Sign when Signing Visit Safety precautions to be maintained throughout the outpatient stay will include: orient to surroundings, keep bed in low position, maintain call bell within reach at all times, provide assistance with transfer out of bed and ambulation.   Nursing Pain Medication Assessment:  Safety precautions to be maintained throughout the outpatient stay  will include: orient to surroundings, keep bed in low position,  maintain call bell within reach at all times, provide assistance with transfer out of bed and ambulation.  Medication Inspection Compliance: Pill count conducted under aseptic conditions, in front of the patient. Neither the pills nor the bottle was removed from the patient's sight at any time. Once count was completed pills were immediately returned to the patient in their original bottle.  Medication: Oxycodone /APAP Pill/Patch Count: 37 of 120 pills/patches remain Pill/Patch Appearance: Markings consistent with prescribed medication Bottle Appearance: Standard pharmacy container. Clearly labeled. Filled Date: 6 / 6 / 2025 Last Medication intake:  Today    UDS:  Summary  Date Value Ref Range Status  06/05/2022 Note  Final    Comment:    ==================================================================== [employer] Select 13 (MW) ==================================================================== Test                             Result       Flag       Units  Drug Present and Declared for Prescription Verification   Oxycodone                       717          EXPECTED   ng/mg creat   Oxymorphone                    700          EXPECTED   ng/mg creat   Noroxycodone                   2135         EXPECTED   ng/mg creat   Noroxymorphone                 348          EXPECTED   ng/mg creat    Sources of oxycodone  are scheduled prescription medications.    Oxymorphone, noroxycodone, and noroxymorphone are expected    metabolites of oxycodone . Oxymorphone is also available as a    scheduled prescription medication.  ==================================================================== Test                      Result    Flag   Units      Ref Range   Creatinine              23               mg/dL      >=79 ==================================================================== Declared Medications:  The flagging and interpretation on this report are based on the  following declared  medications.  Unexpected results may arise from  inaccuracies in the declared medications.   **Note: The testing scope of this panel includes these medications:   Oxycodone  (Percocet)   **Note: The testing scope of this panel does not include the  following reported medications:   Acetaminophen  (Percocet)  Amlodipine (Norvasc)  Baclofen  (Lioresal )  Cannabidiol  Citalopram (Celexa)  Cyclobenzaprine  (Flexeril )  Meloxicam  (Mobic )  Trazodone (Desyrel) ==================================================================== For clinical consultation, please call [phone]. ====================================================================     No results found for: CBDTHCR No results found for: D8THCCBX No results found for: D9THCCBX  ROS  Constitutional: Denies any fever or chills Gastrointestinal: No reported hemesis, hematochezia, vomiting, or acute GI distress Musculoskeletal: Right shoulder pain, neck pain, bilateral foot pain Neurological: No reported episodes of  acute onset apraxia, aphasia, dysarthria, agnosia, amnesia, paralysis, loss of coordination, or loss of consciousness  Medication Review  NON FORMULARY, amLODipine, citalopram, methocarbamol , oxyCODONE -acetaminophen , and traZODone  History Review  Allergy: Charles Mooney is allergic to sulfa antibiotics. Drug: Charles Mooney  reports that he does not currently use drugs. Alcohol:  reports current alcohol use of about 18.0 standard drinks of alcohol per week. Tobacco:  reports that he has been smoking cigarettes. He has never used smokeless tobacco. Social: Charles Mooney  reports that he has been smoking cigarettes. He has never used smokeless tobacco. He reports current alcohol use of about 18.0 standard drinks of alcohol per week. He reports that he does not currently use drugs. Medical:  has a past medical history of Arthritis, Hepatitis, Hypertension, Subarachnoid hemorrhage (HCC) (2017), and Wears hearing aid  in both ears. Surgical: Charles Mooney  has a past surgical history that includes Brain surgery; Joint replacement (Bilateral); Shoulder arthroscopy with rotator cuff repair and open biceps tenodesis (Left, 09/08/2018); and Hernia repair (1963). Family: family history is not on file.  Laboratory Chemistry Profile   Renal Lab Results  Component Value Date   BUN 18 09/04/2018   CREATININE 0.81 09/04/2018   GFRAA >60 09/04/2018   GFRNONAA >60 09/04/2018    Hepatic Lab Results  Component Value Date   AST 18 09/04/2018   ALT 20 09/04/2018   ALBUMIN 4.4 09/04/2018   ALKPHOS 39 09/04/2018    Electrolytes Lab Results  Component Value Date   NA 138 09/04/2018   K 3.9 09/04/2018   CL 105 09/04/2018   CALCIUM 9.2 09/04/2018    Bone No results found for: VD25OH, VD125OH2TOT, CI6874NY7, CI7874NY7, 25OHVITD1, 25OHVITD2, 25OHVITD3, TESTOFREE, TESTOSTERONE  Inflammation (CRP: Acute Phase) (ESR: Chronic Phase) No results found for: CRP, ESRSEDRATE, LATICACIDVEN       Note: Above Lab results reviewed.  Recent Imaging Review  DG PAIN CLINIC C-ARM 1-60 MIN NO REPORT Fluoro was used, but no Radiologist interpretation will be provided.  Please refer to NOTES tab for provider progress note. Note: Reviewed        Physical Exam  General appearance: Well nourished, well developed, and well hydrated. In no apparent acute distress Mental status: Alert, oriented x 3 (person, place, & time)       Respiratory: No evidence of acute respiratory distress Eyes: PERLA Vitals: BP 138/85   Pulse 75   Temp 97.9 F (36.6 C)   Ht 6' (1.829 m)   Wt 220 lb (99.8 kg)   SpO2 97%   BMI 29.84 kg/m  BMI: Estimated body mass index is 29.84 kg/m as calculated from the following:   Height as of this encounter: 6' (1.829 m).   Weight as of this encounter: 220 lb (99.8 kg). Ideal: Ideal body weight: 77.6 kg (171 lb 1.2 oz) Adjusted ideal body weight: 86.5 kg (190 lb 10.3  oz)  Assessment   Diagnosis Status  1. Chronic pain syndrome   2. Cervicalgia   3. Arthritis of right shoulder region   4. Encounter for long-term opiate analgesic use   5. Lumbar facet arthropathy   6. Myofascial pain syndrome of thoracic spine   7. Intercostal neuralgia   8. Closed fracture of one rib of left side with routine healing, subsequent encounter   9. Chronic SI joint pain   10. Localized osteoarthritis of shoulder regions, bilateral   11. Chronic pain of left heel    Controlled Controlled Controlled   Updated Problems: No problems updated.  Plan of Care  Problem-specific:  Assessment and Plan  Discontinue Percocet 10-325 mg tablet every 6 hours as needed  Started on Oxycodone -acetaminophen  7.5-325 mg every 8 hours as needed for up to 30 days. Prescribing drug monitoring (PDMP) reviewed; findings consistent with the use of prescribed medication and no evidence of narcotic misuse or abuse. Routine UDS ordered today.  Schedule follow-up in 30 days for medication management with Magnum Lunde, NP.  Charles Mooney has a current medication list which includes the following long-term medication(s): amlodipine and citalopram.  Pharmacotherapy (Medications Ordered): Meds ordered this encounter  Medications   oxyCODONE -acetaminophen  (PERCOCET) 7.5-325 MG tablet    Sig: Take 1 tablet by mouth every 8 (eight) hours as needed for moderate pain (pain score 4-6) or severe pain (pain score 7-10). Must last 30 days. Fill up one day early on 08/24/2023    Dispense:  90 tablet    Refill:  0    Chronic Pain: STOP Act (Not applicable) Fill 1 day early if closed on refill date. Avoid benzodiazepines within 8 hours of opioids   Orders:  Orders Placed This Encounter  Procedures   [employer] Select 13 (MW), Urine    Volume: 30 ml(s). Minimum 3 ml of urine is needed. Document temperature of fresh sample. Indications: Long term (current) use of opiate analgesic (S20.108)     Release to patient:   Immediate        Return in about 1 month (around 09/13/2023) for (F2F), (MM), Emmy Blanch NP.    Recent Visits Date Type Provider Dept  05/23/23 Office Visit Marcelino Nurse, MD [facility]  Showing recent visits within past 90 days and meeting all other requirements Today's Visits Date Type Provider Dept  08/14/23 Office Visit Jillian Warth K, NP [facility]  Showing today's visits and meeting all other requirements Future Appointments Date Type Provider Dept  09/11/23 Appointment Khaleem Burchill K, NP [facility]  Showing future appointments within next 90 days and meeting all other requirements  I discussed the assessment and treatment plan with the patient. The patient was provided an opportunity to ask questions and all were answered. The patient agreed with the plan and demonstrated an understanding of the instructions.  Patient advised to call back or seek an in-person evaluation if the symptoms or condition worsens.  Duration of encounter: 30 minutes.  Total time on encounter, as per AMA guidelines included both the face-to-face and non-face-to-face time personally spent by the physician and/or other qualified health care professional(s) on the day of the encounter (includes time in activities that require the physician or other qualified health care professional and does not include time in activities normally performed by clinical staff). Physician's time may include the following activities when performed: Preparing to see the patient (e.g., pre-charting review of records, searching for previously ordered imaging, lab work, and nerve conduction tests) Review of prior analgesic pharmacotherapies. Reviewing PMP Interpreting ordered tests (e.g., lab work, imaging, nerve conduction tests) Performing post-procedure evaluations, including interpretation of diagnostic procedures Obtaining and/or reviewing separately obtained  history Performing a medically appropriate examination and/or evaluation Counseling and educating the patient/family/caregiver Ordering medications, tests, or procedures Referring and communicating with other health care professionals (when not separately reported) Documenting clinical information in the electronic or other health record Independently interpreting results (not separately reported) and communicating results to the patient/ family/caregiver Care coordination (not separately reported)  Note by: Kirtan Sada K Pilar Corrales, NP (TTS and AI technology used. I apologize for  any typographical errors that were not detected and corrected.) Date: 08/14/2023; Time: 9:55 AM

## 2023-08-14 NOTE — Progress Notes (Signed)
 Safety precautions to be maintained throughout the outpatient stay will include: orient to surroundings, keep bed in low position, maintain call bell within reach at all times, provide assistance with transfer out of bed and ambulation.   Nursing Pain Medication Assessment:  Safety precautions to be maintained throughout the outpatient stay will include: orient to surroundings, keep bed in low position, maintain call bell within reach at all times, provide assistance with transfer out of bed and ambulation.  Medication Inspection Compliance: Pill count conducted under aseptic conditions, in front of the patient. Neither the pills nor the bottle was removed from the patient's sight at any time. Once count was completed pills were immediately returned to the patient in their original bottle.  Medication: Oxycodone /APAP Pill/Patch Count: 37 of 120 pills/patches remain Pill/Patch Appearance: Markings consistent with prescribed medication Bottle Appearance: Standard pharmacy container. Clearly labeled. Filled Date: 6 / 6 / 2025 Last Medication intake:  Today

## 2023-08-15 ENCOUNTER — Ambulatory Visit: Admitting: Physical Therapy

## 2023-08-15 DIAGNOSIS — M546 Pain in thoracic spine: Secondary | ICD-10-CM

## 2023-08-15 DIAGNOSIS — M542 Cervicalgia: Secondary | ICD-10-CM

## 2023-08-15 DIAGNOSIS — M25511 Pain in right shoulder: Secondary | ICD-10-CM | POA: Diagnosis not present

## 2023-08-15 DIAGNOSIS — M6281 Muscle weakness (generalized): Secondary | ICD-10-CM

## 2023-08-15 NOTE — Therapy (Signed)
 OUTPATIENT PHYSICAL THERAPY TREATMENT   Patient Name: Charles Mooney MRN: [id number] DOB:Jan 26, 1956, 68 y.o., male Today's Date: 08/15/2023  END OF SESSION:  PT End of Session - 08/15/23 1052     Visit Number 8    Number of Visits 17    Date for PT Re-Evaluation 10/15/23    Authorization Type MEDICARE PART B reporting period from 07/23/2023    Progress Note Due on Visit 10    PT Start Time 1035    PT Stop Time 1136    PT Time Calculation (min) 61 min    Activity Tolerance Patient tolerated treatment well    Behavior During Therapy [facility] for tasks assessed/performed           Past Medical History:  Diagnosis Date   Arthritis    Hepatitis    c 2000   Hypertension    Subarachnoid hemorrhage (HCC) 2017   Wears hearing aid in both ears    Past Surgical History:  Procedure Laterality Date   BRAIN SURGERY     [facility] COILING 2017   HERNIA REPAIR  1963   double hernia repair   JOINT REPLACEMENT Bilateral    TKR  L-2012, R-2015   SHOULDER ARTHROSCOPY WITH ROTATOR CUFF REPAIR AND OPEN BICEPS TENODESIS Left 09/08/2018   Procedure: LEFT SHOULDER ARTHROSCOPY,SUBSACAPULARIS REPAIR, SUBACROMIAL DECOMP, DISTAL CLAVICLE EXCISION,BICEP TENODESIS, MINI OPEN REGENTEN PATCH APPLICATION;  Surgeon: Tobie Priest, MD;  Location: [facility] ORS;  Service: Orthopedics;  Laterality: Left;   Patient Active Problem List   Diagnosis Date Noted   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   Closed fracture of rib of left side with routine healing 09/04/2022   Chronic pain of left heel 08/31/2021   Arthropathy of left wrist 10/12/2020   Viral hepatitis C 11/02/2019   Lumbar facet arthropathy 04/27/2019   Lumbar degenerative disc disease 04/27/2019   Localized osteoarthritis of shoulder regions, bilateral 04/27/2019   Encounter for long-term opiate analgesic use 04/27/2019   Chronic SI joint pain 04/27/2019   Chronic pain syndrome 04/27/2019   Pain in joint of left shoulder 06/03/2018   Abnormal gait 06/18/2017    Anatomical narrow angle 06/18/2017   Derangement of posterior horn of medial meniscus 06/18/2017   Edema 06/18/2017   Hip pain 06/18/2017   History of total knee arthroplasty 06/18/2017   Localized, primary osteoarthritis 06/18/2017   Muscle weakness 06/18/2017   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   Acute bursitis of right shoulder 06/14/2017   Nonallopathic lesion of sacral region 06/14/2017   Nonallopathic lesion of thoracic region 06/14/2017   Nonallopathic lesion of lumbosacral region 06/14/2017   Biomechanical lesion 06/14/2017   Chronic bilateral low back pain with bilateral sciatica 05/23/2017   Arthritis 04/19/2013   Cataract nuclear 06/16/2012   Glaucoma suspect of both eyes 06/16/2012    PCP: Sherial Bail, MD  REFERRING PROVIDER: Marcelino Nurse, MD  REFERRING DIAG: cervicalgia, arthritis of right shoulder region, myofascial pain syndrome of thoracic spine  Rationale for Evaluation and Treatment: Rehabilitation  THERAPY DIAG:  Right shoulder pain, unspecified chronicity  Cervicalgia  Muscle weakness (generalized)  Pain in thoracic spine  ONSET DATE: Neck pain and shoulder pain worsened since January 2025, October 2024 for right scapular pain (approximately 3 months after rib fracture in July 2024)  SUBJECTIVE:  PERTINENT HISTORY:  Patient is a 68 y.o. male who presents to outpatient physical therapy with a referral for medical diagnosis of cervicalgia, arthritis of right shoulder region, myofascial pain syndrome of thoracic spine. This patient's chief complaints consist of acute on chronic R shoulder pain and popping, neck pain, and R shoulder pain leading to the following functional deficits: limits him with everyday activities/tasks to some amount, working on projects,  working on the computer, walking for exercise, sleeping, working in his shop, house maintenance and projects, doing as much as he wants to, regular exercise.   Relevant past medical history and comorbidities include bilateral shoulder surgery (RTC repair, R most recent 12/2020) and subarachnoid hemorrhage in 2017, depression. Patient denies hx of cancer, seizures, unexplained weight loss, unexplained changes in bowel or bladder problems, unexplained stumbling or dropping things, back surgery.   Exercise history: he used to do bench presses, flies, bent over rows, lat pull in front and behind head, curls, triceps extensions overhead. He worked out at home 2-3/x a week but he always struggled to be consistent. 45-60 minutes. He stopped approx 10 years ago when he started building his current house. Then he had a stroke and then it was all he could do to work. He did not have any extra energy or willpower at that point. He is currently doing all of this for his grandkids.   He struggles with depression, Sees someone for talk therapy 1x a month (50 minutes) with the same person. He is limited by the fact he has to pay out of pocket. He takes celexa and his GP switched him to Wellbutrin. He did not like this so he went back to celexa. He has not tried any other medications for that. He had hepatitis C in the early 90s. He was put on interferon and rivovinan. That caused some really serious depression and was put on something then but cannot remember what it was (it might have been amitriptyline, and it was wild). He feels his depression got fully blown and noticeable about 3 years ago, around the time he got his shoulder done. At that time he made a decision to move at that time and it really triggered it because he built the house he is in now and he had planned to retire there. He has not moved yet, but he is currently packing. He also feels like his depression is also related to not being in shape. He thinks if  he could get his pain and physical conditioning under control, his depression would be much better.    SUBJECTIVE STATEMENT: Patient states he is marvelous today because he decided to be marvelous. He would like to do mechanical traction today. He felt very good after last PT session up until the afternoon when he had to listen to a home inspector talk about what was wrong with the house he is moving from that he spent 8 years building. His neck pain is always worse at the end of the day.    PAIN: Are you having pain? Yes;  NPRS: 3/10 center neck and constant (always worse at end of day), 3/10 R anterior shoulder, 3/10 R periscapular region     FUNCTIONAL LIMITATIONS:limits him with everyday activities/tasks to some amount, working on projects, working on the computer, walking for exercise, sleeping, working in his shop, house maintenance and projects, doing as much as he wants to, regular exercise  LEISURE: working in the shop, building and fixing things  PRECAUTIONS: None  WEIGHT BEARING RESTRICTIONS: No   PLOF: Independent  PATIENT GOALS: to get rid of the pain, to get on an exercise and stretching regimen for his overall health, sleep with less difficulty from pain.  Next MD appointment: June or July with Dr. Marcelino  OBJECTIVE   1RM TESTING Bent Over Row with contralateral UE braced (last tested 03/27/2023): 1 RM for Right Arm: 75.8  1 RM for Left Arm: 80.2   Bent Over Shoulder Scaption (Y):  (last tested 03/27/2023): 1 RM for Right Arm: 8.9  1 RM for Left Arm: 9.1   TREATMENT                                                                                                                           Therapeutic exercise: therapeutic exercises that incorporate ONE parameter at one or more areas of the body to centralize symptoms, develop strength and endurance, range of motion, and flexibility required for successful completion of functional activities.   UBE L10, 3 min  forward, 3 min backward.   Seated cervical spine rotational SNAG with strap 2x12 each side   Seated cervical retractions into blue ball on wall for resistance:  3x12  Hooklying pec stretch and shoulder angels while laying on half foam roll along entire spine.  [id number] with 2#DB  After each set of chest press  Superset:  Sidelying L shoulder ER 3x12 with 2#DB  R knee on mat to prevent compensatory roll Towel roll under L elbow Cuing to prevent elbow extension  Standing L shoulder IR 3x12 with double black TB  Education on HEP including handout and yellow and red therabands  Therapeutic activities: dynamic therapeutic activities incorporating MULTIPLE parameters or areas of the body designed to achieve improved functional performance.  Hookling DB chest press with double plus while laying on half foam roll along spine (for improved pushing, working, exercising) 3x12 with 20#DB in each hand    Superset:  Seated short arc R shoulder scaption (~80-120 deg) to improve reaching 3x12/11 AROM Starting long lever arm and moving to short when too fatigued to do more reps  Standing row with scap retractions at [facility] (for improved pulling): 3x12 at 45#  Superset:  Inclined B shoulder scaption on [employer] with gentle cervical spine retraction to improve posterior chain strength and coordination for reaching 3x12 with 2#DBs  Seated OMEGA Lat pull down (for improved pulling): 3x12 at 65#  Sled push (improved shoulder strength and stability for pushing) 300 feet with sled (75#) + 140#  Rests as needed (2 major rests noted) Patient turns it independently  Pt required multimodal cuing for proper technique and to facilitate improved neuromuscular control, strength, range of motion, and functional ability resulting in improved performance and form.  Mechanical Traction: to improve cervical spine and thoracic pain, and help decrease muscle tension.  Type: Cervical Min (lb): 0 Max (lb):  35  Hold time: 30 seconds Rest time: 10 seconds Pull angle: ~20 degrees Time:  15 min  plus set up and take down Result:  good tolerance during, no complaints after, decreased neck pain.     PATIENT EDUCATION:  Education details: Exercise purpose/form. Self management techniques. Education on diagnosis, prognosis, POC, anatomy and physiology of current condition. Education on HEP including handout. Person educated: Patient Education method: Explanation, Demonstration Education comprehension: verbalized understanding and needs further education  HOME EXERCISE PROGRAM: Access Code: [facility] URL: [email or web address] Date: 08/15/2023 Prepared by: Camie Cleverly  Exercises - Cervical SNAG Rotation  - 3 x weekly - 2 sets - 12 reps - Standing Isometric Cervical Retraction with Chin Tucks and Ball at [employer]  - 3 x weekly - 3 sets - 12 reps - Supine Chest Press with Dumbbells  - 3 x weekly - 3 sets - 12 reps - [employer] on Foam Roll  - 3 x weekly - 3 sets - 12 reps - Sidelying Shoulder ER with Towel and Dumbbell  - 3 x weekly - 3 sets - 12 reps - Seated Single Arm Shoulder Scaption  - 3 x weekly - 3 sets - 12 reps - Lat Pull Down - Cable/Bar  - 3 x weekly - 3 sets - 12 reps - Row with band/cable  - 3 x weekly - 3 sets - 12 reps - Prone Lower Trapezius Strengthening on Swiss Ball with Dumbbells  - 3 x weekly - 3 sets - 12 reps - Standing Shoulder Internal Rotation with Anchored Resistance  - 3 x weekly - 3 sets - 12 reps    ASSESSMENT:  CLINICAL IMPRESSION: Continued with same projection as last session as patient continues to respond well and continues to require postural and shoulder girdle strengthening. Pain temporarily up to 5/10 with chest press but resolves back to baseline with rest. Patient reports he feels wonderful after exercises. He was able to progress load slightly with shoulder ER. Scaption and ER continue to be his weakest shoulder movements but less painful  now. He will be out of town next week, so he is planning to do his HEP more frequently instead of coming to PT. Plan to progress as able and update HEP as appropriate next visit. Patient would benefit from continued management of limiting condition by skilled physical therapist to address remaining impairments and functional limitations to work towards stated goals and return to PLOF or maximal functional independence.     From initial PT evaluation on 07/23/2023:  Patient is a 68 y.o. male referred to outpatient physical therapy with a medical diagnosis of  cervicalgia, arthritis of right shoulder region, myofascial pain syndrome of thoracic spine who presents with signs and symptoms consistent with cervicalgia possibly radiating to R periscapular region, acute on chronic R shoulder pain s/p RTC repair in 2022. He has increased joint play and painful popping consistent with SLAP lesion as well as weak and painful ER and abduction suggesting rotator cuff related pain in the R shoulder. Patient presents with significant pain, joint stability, ROM, joint stiffness, muscle performance (strength/power/endurance), activity tolerance, and knowledge impairments that are limiting ability to complete everyday activities/tasks to some amount, working on projects, working on the computer, walking for exercise, sleeping, working in his shop, house maintenance and projects, doing as much as he wants to, regular exercise without difficulty. Patient will benefit from skilled physical therapy intervention to address current body structure impairments and activity limitations to improve function and work towards goals set in current POC in order to return to prior level of function or maximal  functional improvement.    OBJECTIVE IMPAIRMENTS: decreased activity tolerance, decreased endurance, decreased knowledge of condition, decreased mobility, joint stiffness, joint instability, decreased ROM, decreased strength, hypomobility,  impaired perceived functional ability, increased muscle spasms, impaired flexibility, impaired UE functional use, postural dysfunction, and pain.   ACTIVITY LIMITATIONS: carrying, lifting, bending, sitting, standing, squatting, sleeping, transfers, bed mobility, bathing, dressing, hygiene/grooming, reaching overhead, stabilizing with B UE, locomotion level, and caring for others  PARTICIPATION LIMITATIONS: meal prep, cleaning, laundry, interpersonal relationship, driving, shopping, community activity, yard work, and  Advertising account executive, working on projects, working on [employer], walking for exercise, sleeping, working in his shop, house maintenance and projects, doing as much as he wants to, regular exercise  PERSONAL FACTORS: Age, Past/current experiences, Time since onset of injury/illness/exacerbation,behavior,  and 3+ comorbidities: depression, bilateral shoulder surgery (RTC repair, R most recent 12/2020), bilateral TKA, subarachnoid hemorrhage 2017 with SAH coiling, HTN, hx of hepatitis C (cured), double hernia repair, plantar facial fibromatosis, chronic pain syndrome, former smoker, Chronic bilateral low back pain with bilateral sciatica; Acute bursitis of right shoulder; Lumbar facet arthropathy; Lumbar degenerative disc disease; Localized osteoarthritis of shoulder regions, bilateral; Chronic SI joint pain; Chronic pain syndrome; Pain in joint of left shoulder; Abnormal gait; Anatomical narrow angle; Biomechanical lesion; Cataract nuclear; Derangement of posterior horn of medial meniscus; Edema; Glaucoma suspect of both eyes; Hip pain; History of total knee arthroplasty; Localized, primary osteoarthritis; Hx of subarachnoid hemophage, Muscle weakness; Arthritis; Plantar fascial fibromatosis; Viral hepatitis C; Arthropathy of left wrist; Chronic pain of left heel; Intercostal neuralgia; and Closed fracture of rib of left side with routine healing,  has a past medical history of Arthritis,  Hepatitis, Hypertension, Subarachnoid hemorrhage (HCC) (2017), and Wears hearing aid in both ears.  has a past surgical history that includes Brain surgery; Joint replacement (Bilateral); Shoulder arthroscopy with rotator cuff repair and open biceps tenodesis (Left, 09/08/2018); and Hernia repair (1963). Also had surgery on right shoulder are also affecting patient's functional outcome.   REHAB POTENTIAL: Good   CLINICAL DECISION MAKING: Evolving/moderate complexity  EVALUATION COMPLEXITY: Moderate   GOALS: Goals reviewed with patient? No  SHORT TERM GOALS: Target date: 08/06/2023  Patient will be independent with initial home exercise program for self-management of symptoms. Baseline: Continue with HEP from last episode of care (07/23/23); Goal status: In progress  LONG TERM GOALS: Target date: 10/15/2023  Patient will be independent with a long-term home exercise program for self-management of symptoms.  Baseline: Continue with past HEP (07/23/23); Goal status: In progress  2.  Patient will demonstrate improved Upper Extremity Functional Scale (UEFS) to equal or greater than 64/80 to demonstrate improvement in overall condition and self-reported functional ability.  Baseline: to be measured at visit 2 as appropriate (07/23/23); Goal status: In progress  3.  Patient will successfully participate in a exercise program for health at least 3 days a week without exacerbation of symptoms.  Baseline: not participating and difficulty participating due to symptom exacerbation (07/23/23); Goal status: In progress  4.  R shoulder strength will improve at least 1/2 MMT grade or be 4+/5 or more without increased pain to improve his ability to work on projects, home maintenance, and a fitness program.  Baseline: weak and painful - see objective (07/23/23); Goal status: In progress  5.  Patient will demonstrate improvement in Patient Specific Functional Scale (PSFS) of equal or greater than 8/10  points for all three body parts to reflect clinically significant improvement in patient's most valued functional activities. Baseline:  to be measured at visit 2 as appropriate (07/23/23); Goal status: In progress  6.  Patient will report NPRS equal or less than 3/10 during functional activities during the last 2 weeks to improve their abilitly to complete community, work and/or recreational activities with less limitation. Baseline: to be measured visit 2 as appropriate (07/23/23); Goal status: In progress    PLAN:  PT FREQUENCY: 1-2x/week  PT DURATION: 8-12 weeks  PLANNED INTERVENTIONS: 97164- PT Re-evaluation, 97750- Physical Performance Testing, 97110-Therapeutic exercises, 97530- Therapeutic activity, [id number]- Neuromuscular re-education, 97535- Self Care, [id number]- Manual therapy, [id number]- Electrical stimulation (unattended), [phone]- Traction (mechanical), 20560 (1-2 muscles), 20561 (3+ muscles)- Dry Needling, Patient/Family education, Joint mobilization, Spinal mobilization, Cryotherapy, and Moist heat.  PLAN FOR NEXT SESSION: update HEP as appropriate, graded/progressive postural/UE/functional strengthening, core strengthening, manual therapy as needed, mechanical traction. Gentle R shoulder stability exercises progressing to strengthening as tolerated.    Camie SAUNDERS. Juli, PT, DPT 08/15/23, 11:42 AM  [facility] [address] [place], [place] [address] P: [phone] I F: [phone]

## 2023-08-19 ENCOUNTER — Encounter: Admitting: Physical Therapy

## 2023-08-19 LAB — TOXASSURE SELECT 13 (MW), URINE

## 2023-08-20 ENCOUNTER — Encounter: Admitting: Nurse Practitioner

## 2023-08-27 ENCOUNTER — Ambulatory Visit: Attending: Student in an Organized Health Care Education/Training Program | Admitting: Physical Therapy

## 2023-08-27 ENCOUNTER — Encounter: Payer: Self-pay | Admitting: Physical Therapy

## 2023-08-27 DIAGNOSIS — M25511 Pain in right shoulder: Secondary | ICD-10-CM | POA: Insufficient documentation

## 2023-08-27 DIAGNOSIS — M542 Cervicalgia: Secondary | ICD-10-CM | POA: Insufficient documentation

## 2023-08-27 DIAGNOSIS — M6281 Muscle weakness (generalized): Secondary | ICD-10-CM | POA: Insufficient documentation

## 2023-08-27 DIAGNOSIS — M546 Pain in thoracic spine: Secondary | ICD-10-CM | POA: Insufficient documentation

## 2023-08-27 NOTE — Therapy (Signed)
 OUTPATIENT PHYSICAL THERAPY TREATMENT   Patient Name: Charles Mooney MRN: [id number] DOB:[birth year], 68 y.o., male Today's Date: 08/27/2023  END OF SESSION:  PT End of Session - 08/27/23 1949     Visit Number 9    Number of Visits 17    Date for PT Re-Evaluation 10/15/23    Authorization Type MEDICARE PART B reporting period from 07/23/2023    Progress Note Due on Visit 10    PT Start Time 1737    PT Stop Time 1817    PT Time Calculation (min) 40 min    Activity Tolerance Patient tolerated treatment well;Patient limited by fatigue    Behavior During Therapy [facility] for tasks assessed/performed            Past Medical History:  Diagnosis Date   Arthritis    Hepatitis    c 2000   Hypertension    Subarachnoid hemorrhage (HCC) 2017   Wears hearing aid in both ears    Past Surgical History:  Procedure Laterality Date   BRAIN SURGERY     [facility] COILING 2017   HERNIA REPAIR  1963   double hernia repair   JOINT REPLACEMENT Bilateral    TKR  L-2012, R-2015   SHOULDER ARTHROSCOPY WITH ROTATOR CUFF REPAIR AND OPEN BICEPS TENODESIS Left 09/08/2018   Procedure: LEFT SHOULDER ARTHROSCOPY,SUBSACAPULARIS REPAIR, SUBACROMIAL DECOMP, DISTAL CLAVICLE EXCISION,BICEP TENODESIS, MINI OPEN REGENTEN PATCH APPLICATION;  Surgeon: Tobie Priest, MD;  Location: [facility] ORS;  Service: Orthopedics;  Laterality: Left;   Patient Active Problem List   Diagnosis Date Noted   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   Closed fracture of rib of left side with routine healing 09/04/2022   Chronic pain of left heel 08/31/2021   Arthropathy of left wrist 10/12/2020   Viral hepatitis C 11/02/2019   Lumbar facet arthropathy 04/27/2019   Lumbar degenerative disc disease 04/27/2019   Localized osteoarthritis of shoulder regions, bilateral 04/27/2019   Encounter for long-term opiate analgesic use 04/27/2019   Chronic SI joint pain 04/27/2019   Chronic pain syndrome 04/27/2019   Pain in joint of left shoulder 06/03/2018    Abnormal gait 06/18/2017   Anatomical narrow angle 06/18/2017   Derangement of posterior horn of medial meniscus 06/18/2017   Edema 06/18/2017   Hip pain 06/18/2017   History of total knee arthroplasty 06/18/2017   Localized, primary osteoarthritis 06/18/2017   Muscle weakness 06/18/2017   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   Acute bursitis of right shoulder 06/14/2017   Nonallopathic lesion of sacral region 06/14/2017   Nonallopathic lesion of thoracic region 06/14/2017   Nonallopathic lesion of lumbosacral region 06/14/2017   Biomechanical lesion 06/14/2017   Chronic bilateral low back pain with bilateral sciatica 05/23/2017   Arthritis 04/19/2013   Cataract nuclear 06/16/2012   Glaucoma suspect of both eyes 06/16/2012    PCP: Sherial Bail, MD  REFERRING PROVIDER: Marcelino Nurse, MD  REFERRING DIAG: cervicalgia, arthritis of right shoulder region, myofascial pain syndrome of thoracic spine  Rationale for Evaluation and Treatment: Rehabilitation  THERAPY DIAG:  Right shoulder pain, unspecified chronicity  Cervicalgia  Muscle weakness (generalized)  Pain in thoracic spine  ONSET DATE: Neck pain and shoulder pain worsened since January 2025, October 2024 for right scapular pain (approximately 3 months after rib fracture in July 2024)  SUBJECTIVE:  PERTINENT HISTORY:  Patient is a 68 y.o. male who presents to outpatient physical therapy with a referral for medical diagnosis of cervicalgia, arthritis of right shoulder region, myofascial pain syndrome of thoracic spine. This patient's chief complaints consist of acute on chronic R shoulder pain and popping, neck pain, and R shoulder pain leading to the following functional deficits: limits him with everyday activities/tasks to some  amount, working on projects, working on the computer, walking for exercise, sleeping, working in his shop, house maintenance and projects, doing as much as he wants to, regular exercise.   Relevant past medical history and comorbidities include bilateral shoulder surgery (RTC repair, R most recent 12/2020) and subarachnoid hemorrhage in 2017, depression. Patient denies hx of cancer, seizures, unexplained weight loss, unexplained changes in bowel or bladder problems, unexplained stumbling or dropping things, back surgery.   Exercise history: he used to do bench presses, flies, bent over rows, lat pull in front and behind head, curls, triceps extensions overhead. He worked out at home 2-3/x a week but he always struggled to be consistent. 45-60 minutes. He stopped approx 10 years ago when he started building his current house. Then he had a stroke and then it was all he could do to work. He did not have any extra energy or willpower at that point. He is currently doing all of this for his grandkids.   He struggles with depression, Sees someone for talk therapy 1x a month (50 minutes) with the same person. He is limited by the fact he has to pay out of pocket. He takes celexa and his GP switched him to Wellbutrin. He did not like this so he went back to celexa. He has not tried any other medications for that. He had hepatitis C in the early 90s. He was put on interferon and rivovinan. That caused some really serious depression and was put on something then but cannot remember what it was (it might have been amitriptyline, and it was wild). He feels his depression got fully blown and noticeable about 3 years ago, around the time he got his shoulder done. At that time he made a decision to move at that time and it really triggered it because he built the house he is in now and he had planned to retire there. He has not moved yet, but he is currently packing. He also feels like his depression is also related to not  being in shape. He thinks if he could get his pain and physical conditioning under control, his depression would be much better.    SUBJECTIVE STATEMENT: Patient states he was at the beach last week but he does not like the beach. Patient states everything feels better although he did not do any of his HEP. He did lay on a float and work his arms. He got back Sunday afternoon. He has not done any physical stuff since he got back except a long walk yesterday. Neck gets tired but pain is no longer constant.    PAIN: Are you having pain? Yes;  NPRS: 2.5/10 center neck and no longer constant, 2.5/10 R anterior shoulder, 2.5/10 R periscapular region     FUNCTIONAL LIMITATIONS:limits him with everyday activities/tasks to some amount, working on projects, working on the computer, walking for exercise, sleeping, working in his shop, house maintenance and projects, doing as much as he wants to, regular exercise  LEISURE: working in the shop, building and fixing things  PRECAUTIONS: None   WEIGHT BEARING RESTRICTIONS: No  PLOF: Independent  PATIENT GOALS: to get rid of the pain, to get on an exercise and stretching regimen for his overall health, sleep with less difficulty from pain.  Next MD appointment: June or July with Dr. Marcelino  OBJECTIVE   1RM TESTING Bent Over Row with contralateral UE braced (last tested 03/27/2023): 1 RM for Right Arm: 75.8  1 RM for Left Arm: 80.2   Bent Over Shoulder Scaption (Y):  (last tested 03/27/2023): 1 RM for Right Arm: 8.9  1 RM for Left Arm: 9.1   TREATMENT                                                                                                                           Therapeutic exercise: therapeutic exercises that incorporate ONE parameter at one or more areas of the body to centralize symptoms, develop strength and endurance, range of motion, and flexibility required for successful completion of functional activities.   UBE L10, 3 min  forward, 3 min backward.   Seated cervical spine rotational SNAG with strap 2x12 each side   Seated cervical retractions into blue ball on wall for resistance:  3x12  Hooklying pec stretch and shoulder angels while laying on half foam roll along entire spine.  [id number] with 2#DB  After each set of chest press Mild discomfort L shoulder  Superset:  Sidelying L shoulder ER 3x12 with 2#DB  R knee on mat to prevent compensatory roll Towel roll under L elbow  Standing L shoulder IR 3x12 with double black TB  Therapeutic activities: dynamic therapeutic activities incorporating MULTIPLE parameters or areas of the body designed to achieve improved functional performance.  Hookling DB chest press with double plus while laying on half foam roll along spine (for improved pushing, working, exercising) 3x12 with 20#DB in each hand   Superset:  Seated short arc R shoulder scaption (~80-120 deg) to improve reaching 3x12 + 1x6 AROM Starting long lever arm and moving to short when too fatigued to do more reps  Standing row with scap retractions at [facility] (for improved pulling): 3x12 at 45#  Superset:  Inclined B shoulder scaption on [employer] with gentle cervical spine retraction to improve posterior chain strength and coordination for reaching 3x12 with 2#DBs  Seated OMEGA Lat pull down (for improved pulling): 3x12 at 65#   Pt required multimodal cuing for proper technique and to facilitate improved neuromuscular control, strength, range of motion, and functional ability resulting in improved performance and form.   PATIENT EDUCATION:  Education details: Exercise purpose/form. Self management techniques. Education on diagnosis, prognosis, POC, anatomy and physiology of current condition. Education on HEP including handout. Person educated: Patient Education method: Explanation, Demonstration Education comprehension: verbalized understanding and needs further education  HOME EXERCISE  PROGRAM: Access Code: [facility] URL: [email or web address] Date: 08/15/2023 Prepared by: Camie Cleverly  Exercises - Cervical SNAG Rotation  - 3 x weekly - 2 sets - 12 reps - Standing Isometric Cervical Retraction with Chin Tucks  and Ball at [employer]  - 3 x weekly - 3 sets - 12 reps - Supine Chest Press with Dumbbells  - 3 x weekly - 3 sets - 12 reps - [place] Angels on Foam Roll  - 3 x weekly - 3 sets - 12 reps - Sidelying Shoulder ER with Towel and Dumbbell  - 3 x weekly - 3 sets - 12 reps - Seated Single Arm Shoulder Scaption  - 3 x weekly - 3 sets - 12 reps - Lat Pull Down - Cable/Bar  - 3 x weekly - 3 sets - 12 reps - Row with band/cable  - 3 x weekly - 3 sets - 12 reps - Prone Lower Trapezius Strengthening on Swiss Ball with Dumbbells  - 3 x weekly - 3 sets - 12 reps - Standing Shoulder Internal Rotation with Anchored Resistance  - 3 x weekly - 3 sets - 12 reps    ASSESSMENT:  CLINICAL IMPRESSION: Patient with decreased pain after being off from exercise and difficulty activity last week. He found returning to prior level of exercise today challenging, especially the chest press. He irritating cramping in his back and legs when lying in hooklying on the half foam roll. Continued strengthening exercises as tolerated with regressions as needed. Patient would benefit from continued management of limiting condition by skilled physical therapist to address remaining impairments and functional limitations to work towards stated goals and return to PLOF or maximal functional independence.   From initial PT evaluation on 07/23/2023:  Patient is a 68 y.o. male referred to outpatient physical therapy with a medical diagnosis of  cervicalgia, arthritis of right shoulder region, myofascial pain syndrome of thoracic spine who presents with signs and symptoms consistent with cervicalgia possibly radiating to R periscapular region, acute on chronic R shoulder pain s/p RTC repair in 2022. He has  increased joint play and painful popping consistent with SLAP lesion as well as weak and painful ER and abduction suggesting rotator cuff related pain in the R shoulder. Patient presents with significant pain, joint stability, ROM, joint stiffness, muscle performance (strength/power/endurance), activity tolerance, and knowledge impairments that are limiting ability to complete everyday activities/tasks to some amount, working on projects, working on the computer, walking for exercise, sleeping, working in his shop, house maintenance and projects, doing as much as he wants to, regular exercise without difficulty. Patient will benefit from skilled physical therapy intervention to address current body structure impairments and activity limitations to improve function and work towards goals set in current POC in order to return to prior level of function or maximal functional improvement.    OBJECTIVE IMPAIRMENTS: decreased activity tolerance, decreased endurance, decreased knowledge of condition, decreased mobility, joint stiffness, joint instability, decreased ROM, decreased strength, hypomobility, impaired perceived functional ability, increased muscle spasms, impaired flexibility, impaired UE functional use, postural dysfunction, and pain.   ACTIVITY LIMITATIONS: carrying, lifting, bending, sitting, standing, squatting, sleeping, transfers, bed mobility, bathing, dressing, hygiene/grooming, reaching overhead, stabilizing with B UE, locomotion level, and caring for others  PARTICIPATION LIMITATIONS: meal prep, cleaning, laundry, interpersonal relationship, driving, shopping, community activity, yard work, and  Advertising account executive, working on projects, working on [employer], walking for exercise, sleeping, working in his shop, house maintenance and projects, doing as much as he wants to, regular exercise  PERSONAL FACTORS: Age, Past/current experiences, Time since onset of  injury/illness/exacerbation,behavior,  and 3+ comorbidities: depression, bilateral shoulder surgery (RTC repair, R most recent 12/2020), bilateral TKA, subarachnoid hemorrhage 2017 with SAH coiling, HTN, hx  of hepatitis C (cured), double hernia repair, plantar facial fibromatosis, chronic pain syndrome, former smoker, Chronic bilateral low back pain with bilateral sciatica; Acute bursitis of right shoulder; Lumbar facet arthropathy; Lumbar degenerative disc disease; Localized osteoarthritis of shoulder regions, bilateral; Chronic SI joint pain; Chronic pain syndrome; Pain in joint of left shoulder; Abnormal gait; Anatomical narrow angle; Biomechanical lesion; Cataract nuclear; Derangement of posterior horn of medial meniscus; Edema; Glaucoma suspect of both eyes; Hip pain; History of total knee arthroplasty; Localized, primary osteoarthritis; Hx of subarachnoid hemophage, Muscle weakness; Arthritis; Plantar fascial fibromatosis; Viral hepatitis C; Arthropathy of left wrist; Chronic pain of left heel; Intercostal neuralgia; and Closed fracture of rib of left side with routine healing,  has a past medical history of Arthritis, Hepatitis, Hypertension, Subarachnoid hemorrhage (HCC) (2017), and Wears hearing aid in both ears.  has a past surgical history that includes Brain surgery; Joint replacement (Bilateral); Shoulder arthroscopy with rotator cuff repair and open biceps tenodesis (Left, 09/08/2018); and Hernia repair (1963). Also had surgery on right shoulder are also affecting patient's functional outcome.   REHAB POTENTIAL: Good   CLINICAL DECISION MAKING: Evolving/moderate complexity  EVALUATION COMPLEXITY: Moderate   GOALS: Goals reviewed with patient? No  SHORT TERM GOALS: Target date: 08/06/2023  Patient will be independent with initial home exercise program for self-management of symptoms. Baseline: Continue with HEP from last episode of care (07/23/23); Goal status: In progress  LONG TERM  GOALS: Target date: 10/15/2023  Patient will be independent with a long-term home exercise program for self-management of symptoms.  Baseline: Continue with past HEP (07/23/23); Goal status: In progress  2.  Patient will demonstrate improved Upper Extremity Functional Scale (UEFS) to equal or greater than 64/80 to demonstrate improvement in overall condition and self-reported functional ability.  Baseline: to be measured at visit 2 as appropriate (07/23/23); Goal status: In progress  3.  Patient will successfully participate in a exercise program for health at least 3 days a week without exacerbation of symptoms.  Baseline: not participating and difficulty participating due to symptom exacerbation (07/23/23); Goal status: In progress  4.  R shoulder strength will improve at least 1/2 MMT grade or be 4+/5 or more without increased pain to improve his ability to work on projects, home maintenance, and a fitness program.  Baseline: weak and painful - see objective (07/23/23); Goal status: In progress  5.  Patient will demonstrate improvement in Patient Specific Functional Scale (PSFS) of equal or greater than 8/10 points for all three body parts to reflect clinically significant improvement in patient's most valued functional activities. Baseline: to be measured at visit 2 as appropriate (07/23/23); Goal status: In progress  6.  Patient will report NPRS equal or less than 3/10 during functional activities during the last 2 weeks to improve their abilitly to complete community, work and/or recreational activities with less limitation. Baseline: to be measured visit 2 as appropriate (07/23/23); Goal status: In progress    PLAN:  PT FREQUENCY: 1-2x/week  PT DURATION: 8-12 weeks  PLANNED INTERVENTIONS: 97164- PT Re-evaluation, 97750- Physical Performance Testing, 97110-Therapeutic exercises, 97530- Therapeutic activity, [id number]- Neuromuscular re-education, 97535- Self Care, [id number]- Manual  therapy, [id number]- Electrical stimulation (unattended), [phone]- Traction (mechanical), 20560 (1-2 muscles), 20561 (3+ muscles)- Dry Needling, Patient/Family education, Joint mobilization, Spinal mobilization, Cryotherapy, and Moist heat.  PLAN FOR NEXT SESSION: update HEP as appropriate, graded/progressive postural/UE/functional strengthening, core strengthening, manual therapy as needed, mechanical traction. Gentle R shoulder stability exercises progressing to strengthening as tolerated.    Camie  FABIENE Cleverly, PT, DPT 08/27/23, 7:51 PM  [facility] [address] [place], [place] [address] P: [phone] I F: [phone]

## 2023-08-29 ENCOUNTER — Ambulatory Visit: Admitting: Physical Therapy

## 2023-08-29 ENCOUNTER — Encounter: Payer: Self-pay | Admitting: Physical Therapy

## 2023-08-29 DIAGNOSIS — M6281 Muscle weakness (generalized): Secondary | ICD-10-CM

## 2023-08-29 DIAGNOSIS — M25511 Pain in right shoulder: Secondary | ICD-10-CM

## 2023-08-29 DIAGNOSIS — M542 Cervicalgia: Secondary | ICD-10-CM

## 2023-08-29 DIAGNOSIS — M546 Pain in thoracic spine: Secondary | ICD-10-CM

## 2023-08-29 NOTE — Therapy (Signed)
 OUTPATIENT PHYSICAL THERAPY TREATMENT / PROGRESS NOTE Dates of reporting from 07/23/2023 to 08/29/2023   Patient Name: Charles Mooney MRN: [id number] DOB:04-23-1955, 68 y.o., male Today's Date: 08/29/2023  END OF SESSION:  PT End of Session - 08/29/23 1550     Visit Number 10    Number of Visits 17    Date for PT Re-Evaluation 10/15/23    Authorization Type MEDICARE PART B reporting period from 07/23/2023    Progress Note Due on Visit 10    PT Start Time 1347    PT Stop Time 1435    PT Time Calculation (min) 48 min    Activity Tolerance Patient tolerated treatment well    Behavior During Therapy [facility] for tasks assessed/performed             Past Medical History:  Diagnosis Date   Arthritis    Hepatitis    c 2000   Hypertension    Subarachnoid hemorrhage (HCC) 2017   Wears hearing aid in both ears    Past Surgical History:  Procedure Laterality Date   BRAIN SURGERY     [facility] COILING 2017   HERNIA REPAIR  1963   double hernia repair   JOINT REPLACEMENT Bilateral    TKR  L-2012, R-2015   SHOULDER ARTHROSCOPY WITH ROTATOR CUFF REPAIR AND OPEN BICEPS TENODESIS Left 09/08/2018   Procedure: LEFT SHOULDER ARTHROSCOPY,SUBSACAPULARIS REPAIR, SUBACROMIAL DECOMP, DISTAL CLAVICLE EXCISION,BICEP TENODESIS, MINI OPEN REGENTEN PATCH APPLICATION;  Surgeon: Tobie Priest, MD;  Location: [facility] ORS;  Service: Orthopedics;  Laterality: Left;   Patient Active Problem List   Diagnosis Date Noted   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   Closed fracture of rib of left side with routine healing 09/04/2022   Chronic pain of left heel 08/31/2021   Arthropathy of left wrist 10/12/2020   Viral hepatitis C 11/02/2019   Lumbar facet arthropathy 04/27/2019   Lumbar degenerative disc disease 04/27/2019   Localized osteoarthritis of shoulder regions, bilateral 04/27/2019   Encounter for long-term opiate analgesic use 04/27/2019   Chronic SI joint pain 04/27/2019   Chronic pain syndrome 04/27/2019   Pain  in joint of left shoulder 06/03/2018   Abnormal gait 06/18/2017   Anatomical narrow angle 06/18/2017   Derangement of posterior horn of medial meniscus 06/18/2017   Edema 06/18/2017   Hip pain 06/18/2017   History of total knee arthroplasty 06/18/2017   Localized, primary osteoarthritis 06/18/2017   Muscle weakness 06/18/2017   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   Acute bursitis of right shoulder 06/14/2017   Nonallopathic lesion of sacral region 06/14/2017   Nonallopathic lesion of thoracic region 06/14/2017   Nonallopathic lesion of lumbosacral region 06/14/2017   Biomechanical lesion 06/14/2017   Chronic bilateral low back pain with bilateral sciatica 05/23/2017   Arthritis 04/19/2013   Cataract nuclear 06/16/2012   Glaucoma suspect of both eyes 06/16/2012    PCP: Sherial Bail, MD  REFERRING PROVIDER: Marcelino Nurse, MD  REFERRING DIAG: cervicalgia, arthritis of right shoulder region, myofascial pain syndrome of thoracic spine  Rationale for Evaluation and Treatment: Rehabilitation  THERAPY DIAG:  Right shoulder pain, unspecified chronicity  Cervicalgia  Muscle weakness (generalized)  Pain in thoracic spine  ONSET DATE: Neck pain and shoulder pain worsened since January 2025, October 2024 for right scapular pain (approximately 3 months after rib fracture in July 2024)  SUBJECTIVE:  PERTINENT HISTORY:  Patient is a 68 y.o. male who presents to outpatient physical therapy with a referral for medical diagnosis of cervicalgia, arthritis of right shoulder region, myofascial pain syndrome of thoracic spine. This patient's chief complaints consist of acute on chronic R shoulder pain and popping, neck pain, and R shoulder pain leading to the following functional deficits: limits him with  everyday activities/tasks to some amount, working on projects, working on the computer, walking for exercise, sleeping, working in his shop, house maintenance and projects, doing as much as he wants to, regular exercise.   Relevant past medical history and comorbidities include bilateral shoulder surgery (RTC repair, R most recent 12/2020) and subarachnoid hemorrhage in 2017, depression. Patient denies hx of cancer, seizures, unexplained weight loss, unexplained changes in bowel or bladder problems, unexplained stumbling or dropping things, back surgery.   Exercise history: he used to do bench presses, flies, bent over rows, lat pull in front and behind head, curls, triceps extensions overhead. He worked out at home 2-3/x a week but he always struggled to be consistent. 45-60 minutes. He stopped approx 10 years ago when he started building his current house. Then he had a stroke and then it was all he could do to work. He did not have any extra energy or willpower at that point. He is currently doing all of this for his grandkids.   He struggles with depression, Sees someone for talk therapy 1x a month (50 minutes) with the same person. He is limited by the fact he has to pay out of pocket. He takes celexa and his GP switched him to Wellbutrin. He did not like this so he went back to celexa. He has not tried any other medications for that. He had hepatitis C in the early 90s. He was put on interferon and rivovinan. That caused some really serious depression and was put on something then but cannot remember what it was (it might have been amitriptyline, and it was wild). He feels his depression got fully blown and noticeable about 3 years ago, around the time he got his shoulder done. At that time he made a decision to move at that time and it really triggered it because he built the house he is in now and he had planned to retire there. He has not moved yet, but he is currently packing. He also feels like his  depression is also related to not being in shape. He thinks if he could get his pain and physical conditioning under control, his depression would be much better.    SUBJECTIVE STATEMENT: Patient states he had some muscle soreness after last PT session. His neck felt fine yesterday but he awoke with some elevated neck pain after sleeping wrong. He states PT is helping and he has less pain and is stronger than before.    PAIN: Are you having pain? Yes;  NPRS: 5/10 center neck and no longer constant, 2.5/10 R anterior shoulder, 2.5/10 R periscapular region     FUNCTIONAL LIMITATIONS:limits him with everyday activities/tasks to some amount, working on projects, working on the computer, walking for exercise, sleeping, working in his shop, house maintenance and projects, doing as much as he wants to, regular exercise  LEISURE: working in the shop, building and fixing things  PRECAUTIONS: None   WEIGHT BEARING RESTRICTIONS: No   PLOF: Independent  PATIENT GOALS: to get rid of the pain, to get on an exercise and stretching regimen for his overall health, sleep with less  difficulty from pain.  Next MD appointment: June or July with Dr. Marcelino  OBJECTIVE   PATIENT SPECIFIC FUNCTIONAL SCALE             1. Exercise: 7            2. Building/Using Tools: 7            3. Walking: 10 4. Sleeping: 8            Average: 8/10   MUSCLE PERFORMANCE (MMT):  *Indicates pain 07/23/23 08/29/23  Joint/Motion R/L R/L  Shoulder      Flexion 3+*/5 4/4+  Abduction (C5) 3+*/5 4/  External rotation 3*/5 3+/5  Internal rotation 5/5 5/5  Extension 5/5 /  Periscapular      Serratus A.  5/5 /  Middle Trap 5/5 /  Lower Trap 5/5 /  Comments:  07/23/2023: cramp in R low back when testing R and L lower and mid traps.  08/29/23: pop in right shoulder with ER resistance  TREATMENT                                                                                                                            Therapeutic exercise: therapeutic exercises that incorporate ONE parameter at one or more areas of the body to centralize symptoms, develop strength and endurance, range of motion, and flexibility required for successful completion of functional activities.  Seated cervical spine rotational SNAG with strap 2x12 each side   Seated cervical retractions into blue ball on wall for resistance:  3x12  MMT to assess progress (see above)  Hooklying pec stretch and shoulder angels while laying on half foam roll along entire spine.  [id number] with 2#DB  After each set of chest press Mild discomfort L shoulder  Superset:  Sidelying L shoulder ER 3x12 with 3#DB  R knee on mat to prevent compensatory roll Towel roll under L elbow Fatigue not pain  Standing L shoulder IR 3x12 with 10#cable  Therapeutic activities: dynamic therapeutic activities incorporating MULTIPLE parameters or areas of the body designed to achieve improved functional performance.  Standing cervical thoracic extension/BUE flexion and serratus anterior activation, lat stretch, with foam roller up wall, to improve reaching and warm up shoulders 2x12  Dead hange to stretch lats, shoulders, back to improve overhead reaching 3x30 seconds  Hookling DB chest press with double plus while laying on half foam roll along spine (for improved pushing, working, exercising) 3x12 with 20#DB in each hand   Superset:  Seated short arc R shoulder scaption (~80-120 deg) to improve reaching 3x12 Loses ROM last 2 reps due to fatigue  Standing row with scap retractions at [facility] (for improved pulling): 3x12 at 55#  Superset:  Inclined B shoulder scaption on [employer] with gentle cervical spine retraction to improve posterior chain strength and coordination for reaching 3x12 with 2#DBs  Seated OMEGA Lat pull down (for improved pulling): 3x12 at 75#   Pt required multimodal cuing  for proper technique and to facilitate improved  neuromuscular control, strength, range of motion, and functional ability resulting in improved performance and form.   PATIENT EDUCATION:  Education details: Exercise purpose/form. Self management techniques. Education on diagnosis, prognosis, POC, anatomy and physiology of current condition. Education on HEP including handout. Person educated: Patient Education method: Explanation, Demonstration Education comprehension: verbalized understanding and needs further education  HOME EXERCISE PROGRAM: Access Code: [facility] URL: [email or web address] Date: 08/15/2023 Prepared by: Camie Cleverly  Exercises - Cervical SNAG Rotation  - 3 x weekly - 2 sets - 12 reps - Standing Isometric Cervical Retraction with Chin Tucks and Ball at [employer]  - 3 x weekly - 3 sets - 12 reps - Supine Chest Press with Dumbbells  - 3 x weekly - 3 sets - 12 reps - [employer] on Foam Roll  - 3 x weekly - 3 sets - 12 reps - Sidelying Shoulder ER with Towel and Dumbbell  - 3 x weekly - 3 sets - 12 reps - Seated Single Arm Shoulder Scaption  - 3 x weekly - 3 sets - 12 reps - Lat Pull Down - Cable/Bar  - 3 x weekly - 3 sets - 12 reps - Row with band/cable  - 3 x weekly - 3 sets - 12 reps - Prone Lower Trapezius Strengthening on Swiss Ball with Dumbbells  - 3 x weekly - 3 sets - 12 reps - Standing Shoulder Internal Rotation with Anchored Resistance  - 3 x weekly - 3 sets - 12 reps    ASSESSMENT:  CLINICAL IMPRESSION: Patient has attended 10 physical therapy session since starting current episode of care on 07/23/2023. He continues to have partial HEP participation, likely related to his mental health conditions he is being treated for elsewhere. He demonstrates excellent participation in the clinic and is making progress with less pain and improving strength in his right shoulder in the weakest motions (flexion, abduction, and ER). Patient's PSFS for 3 body parts was not taken at baseline, but is 8/10 now. Patient  has improved his R shoulder strength by at least 1/2 MMT grade, but it is still too weak to be functional without pain for the level of activity he wants to do in home repairs, yardwork, and exercise for fitness. Therefore, goal was updated to make the goal 4+/5 to be a more functional strength. Plan to continue PT to continue progress and help patient reach his goals. Patient would benefit from continued management of limiting condition by skilled physical therapist to address remaining impairments and functional limitations to work towards stated goals and return to PLOF or maximal functional independence.     From initial PT evaluation on 07/23/2023:  Patient is a 68 y.o. male referred to outpatient physical therapy with a medical diagnosis of  cervicalgia, arthritis of right shoulder region, myofascial pain syndrome of thoracic spine who presents with signs and symptoms consistent with cervicalgia possibly radiating to R periscapular region, acute on chronic R shoulder pain s/p RTC repair in 2022. He has increased joint play and painful popping consistent with SLAP lesion as well as weak and painful ER and abduction suggesting rotator cuff related pain in the R shoulder. Patient presents with significant pain, joint stability, ROM, joint stiffness, muscle performance (strength/power/endurance), activity tolerance, and knowledge impairments that are limiting ability to complete everyday activities/tasks to some amount, working on projects, working on the computer, walking for exercise, sleeping, working in his shop, house maintenance and projects, doing as much as  he wants to, regular exercise without difficulty. Patient will benefit from skilled physical therapy intervention to address current body structure impairments and activity limitations to improve function and work towards goals set in current POC in order to return to prior level of function or maximal functional improvement.    OBJECTIVE  IMPAIRMENTS: decreased activity tolerance, decreased endurance, decreased knowledge of condition, decreased mobility, joint stiffness, joint instability, decreased ROM, decreased strength, hypomobility, impaired perceived functional ability, increased muscle spasms, impaired flexibility, impaired UE functional use, postural dysfunction, and pain.   ACTIVITY LIMITATIONS: carrying, lifting, bending, sitting, standing, squatting, sleeping, transfers, bed mobility, bathing, dressing, hygiene/grooming, reaching overhead, stabilizing with B UE, locomotion level, and caring for others  PARTICIPATION LIMITATIONS: meal prep, cleaning, laundry, interpersonal relationship, driving, shopping, community activity, yard work, and  Advertising account executive, working on projects, working on [employer], walking for exercise, sleeping, working in his shop, house maintenance and projects, doing as much as he wants to, regular exercise  PERSONAL FACTORS: Age, Past/current experiences, Time since onset of injury/illness/exacerbation,behavior,  and 3+ comorbidities: depression, bilateral shoulder surgery (RTC repair, R most recent 12/2020), bilateral TKA, subarachnoid hemorrhage 2017 with SAH coiling, HTN, hx of hepatitis C (cured), double hernia repair, plantar facial fibromatosis, chronic pain syndrome, former smoker, Chronic bilateral low back pain with bilateral sciatica; Acute bursitis of right shoulder; Lumbar facet arthropathy; Lumbar degenerative disc disease; Localized osteoarthritis of shoulder regions, bilateral; Chronic SI joint pain; Chronic pain syndrome; Pain in joint of left shoulder; Abnormal gait; Anatomical narrow angle; Biomechanical lesion; Cataract nuclear; Derangement of posterior horn of medial meniscus; Edema; Glaucoma suspect of both eyes; Hip pain; History of total knee arthroplasty; Localized, primary osteoarthritis; Hx of subarachnoid hemophage, Muscle weakness; Arthritis; Plantar fascial fibromatosis;  Viral hepatitis C; Arthropathy of left wrist; Chronic pain of left heel; Intercostal neuralgia; and Closed fracture of rib of left side with routine healing,  has a past medical history of Arthritis, Hepatitis, Hypertension, Subarachnoid hemorrhage (HCC) (2017), and Wears hearing aid in both ears.  has a past surgical history that includes Brain surgery; Joint replacement (Bilateral); Shoulder arthroscopy with rotator cuff repair and open biceps tenodesis (Left, 09/08/2018); and Hernia repair (1963). Also had surgery on right shoulder are also affecting patient's functional outcome.   REHAB POTENTIAL: Good   CLINICAL DECISION MAKING: Evolving/moderate complexity  EVALUATION COMPLEXITY: Moderate   GOALS: Goals reviewed with patient? No  SHORT TERM GOALS: Target date: 08/06/2023  Patient will be independent with initial home exercise program for self-management of symptoms. Baseline: Continue with HEP from last episode of care (07/23/23); Goal status: In progress  LONG TERM GOALS: Target date: 10/15/2023  Patient will be independent with a long-term home exercise program for self-management of symptoms.  Baseline: Continue with past HEP (07/23/23); participating irregularly Goal status: In progress  2.  Patient will demonstrate improved Upper Extremity Functional Scale (UEFS) to equal or greater than 64/80 to demonstrate improvement in overall condition and self-reported functional ability.  Baseline: to be measured at visit 2 as appropriate (07/23/23); not captured (08/29/23);  Goal status: ongoing  3.  Patient will successfully participate in a exercise program for health at least 3 days a week without exacerbation of symptoms.  Baseline: not participating and difficulty participating due to symptom exacerbation (07/23/23); participating irregularly (08/29/2023);  Goal status: In progress  4.  R shoulder strength will improve at least 4+/5 or more without increased pain to improve his  ability to work on projects, home maintenance, and a fitness program.  Baseline: weak and painful - see objective (07/23/23); less painful and at least 1/2 MMT stronger - updated goal to more functional 4+/5 <<T (08/29/2023) Goal status: In progress  5.  Patient will demonstrate improvement in Patient Specific Functional Scale (PSFS) of equal or greater than 8/10 points for all three body parts to reflect clinically significant improvement in patient's most valued functional activities. Baseline: to be measured at visit 2 as appropriate (07/23/23); 8/10 (08/29/23);  Goal status: MET  6.  Patient will report NPRS equal or less than 3/10 during functional activities during the last 2 weeks to improve their abilitly to complete community, work and/or recreational activities with less limitation. Baseline: to be measured visit 2 as appropriate (07/23/23); 6/10 (08/29/2023);  Goal status: In progress    PLAN:  PT FREQUENCY: 1-2x/week  PT DURATION: 8-12 weeks  PLANNED INTERVENTIONS: 97164- PT Re-evaluation, 97750- Physical Performance Testing, 97110-Therapeutic exercises, 97530- Therapeutic activity, [id number]- Neuromuscular re-education, 97535- Self Care, 02859- Manual therapy, [id number]- Electrical stimulation (unattended), [phone]- Traction (mechanical), 20560 (1-2 muscles), 20561 (3+ muscles)- Dry Needling, Patient/Family education, Joint mobilization, Spinal mobilization, Cryotherapy, and Moist heat.  PLAN FOR NEXT SESSION: update HEP as appropriate, graded/progressive postural/UE/functional strengthening, core strengthening, manual therapy as needed, mechanical traction. Progressive R shoulder girdle and functional strengthening as tolerated.    Camie SAUNDERS. Juli, PT, DPT 08/29/23, 3:52 PM  [facility] [address] [place], [place] [address] P: [phone] I F: [phone]

## 2023-09-03 ENCOUNTER — Ambulatory Visit

## 2023-09-03 DIAGNOSIS — M542 Cervicalgia: Secondary | ICD-10-CM

## 2023-09-03 DIAGNOSIS — M25511 Pain in right shoulder: Secondary | ICD-10-CM

## 2023-09-03 DIAGNOSIS — M6281 Muscle weakness (generalized): Secondary | ICD-10-CM

## 2023-09-03 NOTE — Therapy (Addendum)
 OUTPATIENT PHYSICAL THERAPY TREATMENT   Patient Name: Charles Mooney MRN: [id number] DOB:[birth year], 68 y.o., male Today's Date: 09/03/2023  END OF SESSION:  PT End of Session - 09/03/23 0903     Visit Number 11    Number of Visits 17    Date for PT Re-Evaluation 10/15/23    Authorization Type MEDICARE PART B reporting period from 07/23/2023    Progress Note Due on Visit 20    PT Start Time 0900    PT Stop Time 0940    PT Time Calculation (min) 40 min    Activity Tolerance Patient tolerated treatment well;No increased pain    Behavior During Therapy [facility] for tasks assessed/performed             Past Medical History:  Diagnosis Date   Arthritis    Hepatitis    c 2000   Hypertension    Subarachnoid hemorrhage (HCC) 2017   Wears hearing aid in both ears    Past Surgical History:  Procedure Laterality Date   BRAIN SURGERY     [facility] COILING 2017   HERNIA REPAIR  1963   double hernia repair   JOINT REPLACEMENT Bilateral    TKR  L-2012, R-2015   SHOULDER ARTHROSCOPY WITH ROTATOR CUFF REPAIR AND OPEN BICEPS TENODESIS Left 09/08/2018   Procedure: LEFT SHOULDER ARTHROSCOPY,SUBSACAPULARIS REPAIR, SUBACROMIAL DECOMP, DISTAL CLAVICLE EXCISION,BICEP TENODESIS, MINI OPEN REGENTEN PATCH APPLICATION;  Surgeon: Tobie Priest, MD;  Location: [facility] ORS;  Service: Orthopedics;  Laterality: Left;   Patient Active Problem List   Diagnosis Date Noted   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   Closed fracture of rib of left side with routine healing 09/04/2022   Chronic pain of left heel 08/31/2021   Arthropathy of left wrist 10/12/2020   Viral hepatitis C 11/02/2019   Lumbar facet arthropathy 04/27/2019   Lumbar degenerative disc disease 04/27/2019   Localized osteoarthritis of shoulder regions, bilateral 04/27/2019   Encounter for long-term opiate analgesic use 04/27/2019   Chronic SI joint pain 04/27/2019   Chronic pain syndrome 04/27/2019   Pain in joint of left shoulder 06/03/2018    Abnormal gait 06/18/2017   Anatomical narrow angle 06/18/2017   Derangement of posterior horn of medial meniscus 06/18/2017   Edema 06/18/2017   Hip pain 06/18/2017   History of total knee arthroplasty 06/18/2017   Localized, primary osteoarthritis 06/18/2017   Muscle weakness 06/18/2017   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   Acute bursitis of right shoulder 06/14/2017   Nonallopathic lesion of sacral region 06/14/2017   Nonallopathic lesion of thoracic region 06/14/2017   Nonallopathic lesion of lumbosacral region 06/14/2017   Biomechanical lesion 06/14/2017   Chronic bilateral low back pain with bilateral sciatica 05/23/2017   Arthritis 04/19/2013   Cataract nuclear 06/16/2012   Glaucoma suspect of both eyes 06/16/2012    PCP: Sherial Bail, MD  REFERRING PROVIDER: Marcelino Nurse, MD  REFERRING DIAG: cervicalgia, arthritis of right shoulder region, myofascial pain syndrome of thoracic spine  Rationale for Evaluation and Treatment: Rehabilitation  THERAPY DIAG:  Right shoulder pain, unspecified chronicity  Cervicalgia  Muscle weakness (generalized)  ONSET DATE: Neck pain and shoulder pain worsened since January 2025, October 2024 for right scapular pain (approximately 3 months after rib fracture in July 2024)  SUBJECTIVE:  PERTINENT HISTORY:  Patient is a 68 y.o. male who presents to outpatient physical therapy with a referral for medical diagnosis of cervicalgia, arthritis of right shoulder region, myofascial pain syndrome of thoracic spine. This patient's chief complaints consist of acute on chronic R shoulder pain and popping, neck pain, and R shoulder pain leading to the following functional deficits: limits him with everyday activities/tasks to some amount, working on projects,  working on the computer, walking for exercise, sleeping, working in his shop, house maintenance and projects, doing as much as he wants to, regular exercise.   Relevant past medical history and comorbidities include bilateral shoulder surgery (RTC repair, R most recent 12/2020) and subarachnoid hemorrhage in 2017, depression. Patient denies hx of cancer, seizures, unexplained weight loss, unexplained changes in bowel or bladder problems, unexplained stumbling or dropping things, back surgery.   Exercise history: he used to do bench presses, flies, bent over rows, lat pull in front and behind head, curls, triceps extensions overhead. He worked out at home 2-3/x a week but he always struggled to be consistent. 45-60 minutes. He stopped approx 10 years ago when he started building his current house. Then he had a stroke and then it was all he could do to work. He did not have any extra energy or willpower at that point. He is currently doing all of this for his grandkids.   He struggles with depression, Sees someone for talk therapy 1x a month (50 minutes) with the same person. He is limited by the fact he has to pay out of pocket. He takes celexa and his GP switched him to Wellbutrin. He did not like this so he went back to celexa. He has not tried any other medications for that. He had hepatitis C in the early 90s. He was put on interferon and rivovinan. That caused some really serious depression and was put on something then but cannot remember what it was (it might have been amitriptyline, and it was wild). He feels his depression got fully blown and noticeable about 3 years ago, around the time he got his shoulder done. At that time he made a decision to move at that time and it really triggered it because he built the house he is in now and he had planned to retire there. He has not moved yet, but he is currently packing. He also feels like his depression is also related to not being in shape. He thinks if  he could get his pain and physical conditioning under control, his depression would be much better.   SUBJECTIVE STATEMENT: No pertinent updates since last session.   PAIN: Are you having pain?  NPRS: 6/10 center neck, 3/10 Rt shoulder, 3/10 R periscapular region     FUNCTIONAL LIMITATIONS:limits him with everyday activities/tasks to some amount, working on projects, working on the computer, walking for exercise, sleeping, working in his shop, house maintenance and projects, doing as much as he wants to, regular exercise  LEISURE: working in the shop, building and fixing things  PRECAUTIONS: None  WEIGHT BEARING RESTRICTIONS: No  PLOF: Independent  PATIENT GOALS: to get rid of the pain, to get on an exercise and stretching regimen for his overall health, sleep with less difficulty from pain.  Next MD appointment: July with Dr. Marcelino  OBJECTIVE   PATIENT SPECIFIC FUNCTIONAL SCALE             1. Exercise: 7            2.  Building/Using Tools: 7            3. Walking: 10 4. Sleeping: 8            Average: 8/10  MUSCLE PERFORMANCE (MMT):  *Indicates pain 07/23/23 08/29/23  Joint/Motion R/L R/L  Shoulder      Flexion 3+*/5 4/4+  Abduction (C5) 3+*/5 4/  External rotation 3*/5 3+/5  Internal rotation 5/5 5/5  Extension 5/5 /  Periscapular      Serratus A.  5/5 /  Middle Trap 5/5 /  Lower Trap 5/5 /  Comments:  07/23/2023: cramp in R low back when testing R and L lower and mid traps.  08/29/23: pop in right shoulder with ER resistance  TREATMENT                                                                                                                            -UBE level 11 3' F, 3' Back -Seated cervical spine AA/ROM rotation SNAG with strap 2x12 each side  -Seated cervical retractions into blue ball on wall for resistance 3x12 -Hooklying pec stretch and shoulder angels while laying on half foam roll along entire spine.  3x12 with 2#DB   SUPERSET  a. Left  Sidelying Rt shoulder ER, axillary roll 3x15 with 2#DB, supinated grip (3lb too hard last time) B. standing L shoulder IR 3x12 with 10# OMEGA cable (0 degree GHJ flexion/ABDCT)  SUPERSET Inclined B shoulder scaption on [employer] with gentle cervical spine retraction 3x12 with 2#DBs Seated OMEGA Lat pull down 3x12 at 65#  SUPERSET A. Standing OMEGA cable row 1x12 @ 55lb  B. Seated OMEGA chest press 1x12 @ 45lb (sub in for 20lb FW supine bench press)    PATIENT EDUCATION:  Education details: Exercise purpose/form. Self management techniques. Education on diagnosis, prognosis, POC, anatomy and physiology of current condition. Education on HEP including handout. Person educated: Patient Education method: Explanation, Demonstration Education comprehension: verbalized understanding and needs further education  HOME EXERCISE PROGRAM: Access Code: [facility] URL: [email or web address] Date: 08/15/2023 Prepared by: Camie Cleverly  Exercises - Cervical SNAG Rotation  - 3 x weekly - 2 sets - 12 reps - Standing Isometric Cervical Retraction with Chin Tucks and Ball at [employer]  - 3 x weekly - 3 sets - 12 reps - Supine Chest Press with Dumbbells  - 3 x weekly - 3 sets - 12 reps - [employer] on Foam Roll  - 3 x weekly - 3 sets - 12 reps - Sidelying Shoulder ER with Towel and Dumbbell  - 3 x weekly - 3 sets - 12 reps - Seated Single Arm Shoulder Scaption  - 3 x weekly - 3 sets - 12 reps - Lat Pull Down - Cable/Bar  - 3 x weekly - 3 sets - 12 reps - Row with band/cable  - 3 x weekly - 3 sets - 12 reps - Prone Lower Trapezius Strengthening on [place] with Dumbbells  -  3 x weekly - 3 sets - 12 reps - Standing Shoulder Internal Rotation with Anchored Resistance  - 3 x weekly - 3 sets - 12 reps  ASSESSMENT:  CLINICAL IMPRESSION: Pt continues to tolerate loading program overall. HEP compliance remains poor. Pain recently flared on arrival which is more related to home routine. Some extra  soreness in shoulder after last session, but resolved in anticipated timeframe. Shoulder ER remains most isolated regarding strength impairment of note, increasing load has been limited. Plan to continue PT to continue progress and help patient reach his goals. Patient would benefit from continued management of limiting condition by skilled physical therapist to address remaining impairments and functional limitations to work towards stated goals and return to PLOF or maximal functional independence.     OBJECTIVE IMPAIRMENTS: decreased activity tolerance, decreased endurance, decreased knowledge of condition, decreased mobility, joint stiffness, joint instability, decreased ROM, decreased strength, hypomobility, impaired perceived functional ability, increased muscle spasms, impaired flexibility, impaired UE functional use, postural dysfunction, and pain.   ACTIVITY LIMITATIONS: carrying, lifting, bending, sitting, standing, squatting, sleeping, transfers, bed mobility, bathing, dressing, hygiene/grooming, reaching overhead, stabilizing with B UE, locomotion level, and caring for others  PARTICIPATION LIMITATIONS: meal prep, cleaning, laundry, interpersonal relationship, driving, shopping, community activity, yard work, and  Advertising account executive, working on projects, working on [employer], walking for exercise, sleeping, working in his shop, house maintenance and projects, doing as much as he wants to, regular exercise  PERSONAL FACTORS: Age, Past/current experiences, Time since onset of injury/illness/exacerbation, behavior,  and 3+ comorbidities: depression, bilateral shoulder surgery (RTC repair, R most recent 12/2020), bilateral TKA, subarachnoid hemorrhage 2017 with SAH coiling, HTN, hx of hepatitis C (cured), double hernia repair, plantar facial fibromatosis, chronic pain syndrome, former smoker, Chronic bilateral low back pain with bilateral sciatica; Acute bursitis of right shoulder; Lumbar  facet arthropathy; Lumbar degenerative disc disease; Localized osteoarthritis of shoulder regions, bilateral; Chronic SI joint pain; Chronic pain syndrome; Pain in joint of left shoulder; Abnormal gait; Anatomical narrow angle; Biomechanical lesion; Cataract nuclear; Derangement of posterior horn of medial meniscus; Edema; Glaucoma suspect of both eyes; Hip pain; History of total knee arthroplasty; Localized, primary osteoarthritis; Hx of subarachnoid hemophage, Muscle weakness; Arthritis; Plantar fascial fibromatosis; Viral hepatitis C; Arthropathy of left wrist; Chronic pain of left heel; Intercostal neuralgia; and Closed fracture of rib of left side with routine healing,  has a past medical history of Arthritis, Hepatitis, Hypertension, Subarachnoid hemorrhage (HCC) (2017), and Wears hearing aid in both ears.  has a past surgical history that includes Brain surgery; Joint replacement (Bilateral); Shoulder arthroscopy with rotator cuff repair and open biceps tenodesis (Left, 09/08/2018); and Hernia repair (1963). Also had surgery on right shoulder are also affecting patient's functional outcome.   REHAB POTENTIAL: Good   CLINICAL DECISION MAKING: Evolving/moderate complexity  EVALUATION COMPLEXITY: Moderate   GOALS: Goals reviewed with patient? No  SHORT TERM GOALS: Target date: 08/06/2023  Patient will be independent with initial home exercise program for self-management of symptoms. Baseline: Continue with HEP from last episode of care (07/23/23); Goal status: In progress  LONG TERM GOALS: Target date: 10/15/2023  Patient will be independent with a long-term home exercise program for self-management of symptoms.  Baseline: Continue with past HEP (07/23/23); participating irregularly Goal status: In progress  2.  Patient will demonstrate improved Upper Extremity Functional Scale (UEFS) to equal or greater than 64/80 to demonstrate improvement in overall condition and self-reported functional  ability.  Baseline: to be measured at visit  2 as appropriate (07/23/23); not captured (08/29/23);  Goal status: ongoing  3.  Patient will successfully participate in a exercise program for health at least 3 days a week without exacerbation of symptoms.  Baseline: not participating and difficulty participating due to symptom exacerbation (07/23/23); participating irregularly (08/29/2023);  Goal status: In progress  4.  R shoulder strength will improve at least 4+/5 or more without increased pain to improve his ability to work on projects, home maintenance, and a fitness program.  Baseline: weak and painful - see objective (07/23/23); less painful and at least 1/2 MMT stronger - updated goal to more functional 4+/5 <<T (08/29/2023) Goal status: In progress  5.  Patient will demonstrate improvement in Patient Specific Functional Scale (PSFS) of equal or greater than 8/10 points for all three body parts to reflect clinically significant improvement in patient's most valued functional activities. Baseline: to be measured at visit 2 as appropriate (07/23/23); 8/10 (08/29/23);  Goal status: MET  6.  Patient will report NPRS equal or less than 3/10 during functional activities during the last 2 weeks to improve their abilitly to complete community, work and/or recreational activities with less limitation. Baseline: to be measured visit 2 as appropriate (07/23/23); 6/10 (08/29/2023);  Goal status: In progress  PLAN: PT FREQUENCY: 1-2x/week PT DURATION: 8-12 weeks PLANNED INTERVENTIONS: 97164- PT Re-evaluation, 97750- Physical Performance Testing, 97110-Therapeutic exercises, 97530- Therapeutic activity, [id number]- Neuromuscular re-education, 97535- Self Care, [id number]- Manual therapy, [id number]- Electrical stimulation (unattended), [phone]- Traction (mechanical), 20560 (1-2 muscles), 20561 (3+ muscles)- Dry Needling, Patient/Family education, Joint mobilization, Spinal mobilization, Cryotherapy, and Moist heat.  PLAN  FOR NEXT SESSION: update HEP as appropriate, graded/progressive postural/UE/functional strengthening, core strengthening, manual therapy as needed, mechanical traction. Progressive R shoulder girdle and functional strengthening as tolerated.    9:07 AM, 09/03/23 Peggye JAYSON Linear, PT, DPT Physical Therapist - [place] [phone] (Office)

## 2023-09-05 ENCOUNTER — Ambulatory Visit: Admitting: Physical Therapy

## 2023-09-10 ENCOUNTER — Ambulatory Visit: Admitting: Physical Therapy

## 2023-09-10 ENCOUNTER — Encounter: Payer: Self-pay | Admitting: Physical Therapy

## 2023-09-10 DIAGNOSIS — M542 Cervicalgia: Secondary | ICD-10-CM

## 2023-09-10 DIAGNOSIS — M546 Pain in thoracic spine: Secondary | ICD-10-CM

## 2023-09-10 DIAGNOSIS — M25511 Pain in right shoulder: Secondary | ICD-10-CM | POA: Diagnosis not present

## 2023-09-10 DIAGNOSIS — M6281 Muscle weakness (generalized): Secondary | ICD-10-CM

## 2023-09-10 NOTE — Therapy (Signed)
 OUTPATIENT PHYSICAL THERAPY TREATMENT   Patient Name: Charles Mooney MRN: [id number] DOB:October 03, 1955, 68 y.o., male Today's Date: 09/10/2023  END OF SESSION:  PT End of Session - 09/10/23 0948     Visit Number 12    Number of Visits 17    Date for PT Re-Evaluation 10/15/23    Authorization Type MEDICARE PART B reporting period from 07/23/2023    Progress Note Due on Visit 20    PT Start Time 0947    PT Stop Time 1049    PT Time Calculation (min) 62 min    Activity Tolerance Patient tolerated treatment well    Behavior During Therapy [facility] for tasks assessed/performed              Past Medical History:  Diagnosis Date   Arthritis    Hepatitis    c 2000   Hypertension    Subarachnoid hemorrhage (HCC) 2017   Wears hearing aid in both ears    Past Surgical History:  Procedure Laterality Date   BRAIN SURGERY     [facility] COILING 2017   HERNIA REPAIR  1963   double hernia repair   JOINT REPLACEMENT Bilateral    TKR  L-2012, R-2015   SHOULDER ARTHROSCOPY WITH ROTATOR CUFF REPAIR AND OPEN BICEPS TENODESIS Left 09/08/2018   Procedure: LEFT SHOULDER ARTHROSCOPY,SUBSACAPULARIS REPAIR, SUBACROMIAL DECOMP, DISTAL CLAVICLE EXCISION,BICEP TENODESIS, MINI OPEN REGENTEN PATCH APPLICATION;  Surgeon: Tobie Priest, MD;  Location: [facility] ORS;  Service: Orthopedics;  Laterality: Left;   Patient Active Problem List   Diagnosis Date Noted   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   Closed fracture of rib of left side with routine healing 09/04/2022   Chronic pain of left heel 08/31/2021   Arthropathy of left wrist 10/12/2020   Viral hepatitis C 11/02/2019   Lumbar facet arthropathy 04/27/2019   Lumbar degenerative disc disease 04/27/2019   Localized osteoarthritis of shoulder regions, bilateral 04/27/2019   Encounter for long-term opiate analgesic use 04/27/2019   Chronic SI joint pain 04/27/2019   Chronic pain syndrome 04/27/2019   Pain in joint of left shoulder 06/03/2018   Abnormal gait  06/18/2017   Anatomical narrow angle 06/18/2017   Derangement of posterior horn of medial meniscus 06/18/2017   Edema 06/18/2017   Hip pain 06/18/2017   History of total knee arthroplasty 06/18/2017   Localized, primary osteoarthritis 06/18/2017   Muscle weakness 06/18/2017   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   Acute bursitis of right shoulder 06/14/2017   Nonallopathic lesion of sacral region 06/14/2017   Nonallopathic lesion of thoracic region 06/14/2017   Nonallopathic lesion of lumbosacral region 06/14/2017   Biomechanical lesion 06/14/2017   Chronic bilateral low back pain with bilateral sciatica 05/23/2017   Arthritis 04/19/2013   Cataract nuclear 06/16/2012   Glaucoma suspect of both eyes 06/16/2012    PCP: Sherial Bail, MD  REFERRING PROVIDER: Marcelino Nurse, MD  REFERRING DIAG: cervicalgia, arthritis of right shoulder region, myofascial pain syndrome of thoracic spine  Rationale for Evaluation and Treatment: Rehabilitation  THERAPY DIAG:  Right shoulder pain, unspecified chronicity  Cervicalgia  Muscle weakness (generalized)  Pain in thoracic spine  ONSET DATE: Neck pain and shoulder pain worsened since January 2025, October 2024 for right scapular pain (approximately 3 months after rib fracture in July 2024)  SUBJECTIVE:  PERTINENT HISTORY:  Patient is a 68 y.o. male who presents to outpatient physical therapy with a referral for medical diagnosis of cervicalgia, arthritis of right shoulder region, myofascial pain syndrome of thoracic spine. This patient's chief complaints consist of acute on chronic R shoulder pain and popping, neck pain, and R shoulder pain leading to the following functional deficits: limits him with everyday activities/tasks to some amount, working on  projects, working on the computer, walking for exercise, sleeping, working in his shop, house maintenance and projects, doing as much as he wants to, regular exercise.   Relevant past medical history and comorbidities include bilateral shoulder surgery (RTC repair, R most recent 12/2020) and subarachnoid hemorrhage in 2017, depression. Patient denies hx of cancer, seizures, unexplained weight loss, unexplained changes in bowel or bladder problems, unexplained stumbling or dropping things, back surgery.   Exercise history: he used to do bench presses, flies, bent over rows, lat pull in front and behind head, curls, triceps extensions overhead. He worked out at home 2-3/x a week but he always struggled to be consistent. 45-60 minutes. He stopped approx 10 years ago when he started building his current house. Then he had a stroke and then it was all he could do to work. He did not have any extra energy or willpower at that point. He is currently doing all of this for his grandkids.   He struggles with depression, Sees someone for talk therapy 1x a month (50 minutes) with the same person. He is limited by the fact he has to pay out of pocket. He takes celexa and his GP switched him to Wellbutrin. He did not like this so he went back to celexa. He has not tried any other medications for that. He had hepatitis C in the early 90s. He was put on interferon and rivovinan. That caused some really serious depression and was put on something then but cannot remember what it was (it might have been amitriptyline, and it was wild). He feels his depression got fully blown and noticeable about 3 years ago, around the time he got his shoulder done. At that time he made a decision to move at that time and it really triggered it because he built the house he is in now and he had planned to retire there. He has not moved yet, but he is currently packing. He also feels like his depression is also related to not being in shape. He  thinks if he could get his pain and physical conditioning under control, his depression would be much better.   SUBJECTIVE STATEMENT: Patient states he is not great. He states he skipped Thursday last week because Wednesday his neck started to hurt and it got really bad Thursday morning and he did not feel up to coming in. Since then it has gone up and down but stayed on the worse side. He has found it gets worse while driving, but he is not sure why that is. He can still work because working takes his mind off of it. After PT noted that his neck pain was rated 6/10 at last PT session, patient remembers the pain was bothering him more in the neck all that week. He does not remember how he felt after his PT session previously. The work he has been doing is general handyman kind of stuff. Nothing unusual. Almost all of it is outdoors and physically challenging. Mostly shoulder level and below, a lot of bending over. His elbows are away from his  body for a lot of it. He had trouble sleeping last night but he has been taking magnesium  and trazedone and that is getting him a pretty good night's sleep (but causing a lot of dreams). For the last week or so though he has not been sleeping as well. He has been awakening with neck pain, and is already moving when he awakes. He changes positions and cannot go back to sleep. He gets up and gets a heating pad on, takes an oxycodone  and he cannot go back to sleep. He has not been doing his HEP.  He notes his neck pain increasing seems to correlate with not using traction unit in clinic and would like to use it today.   PAIN: Are you having pain?  NPRS: 5-6/10 center neck, 3/10 Rt shoulder, 3/10 R periscapular region     FUNCTIONAL LIMITATIONS:limits him with everyday activities/tasks to some amount, working on projects, working on the computer, walking for exercise, sleeping, working in his shop, house maintenance and projects, doing as much as he wants to, regular  exercise  LEISURE: working in the shop, building and fixing things  PRECAUTIONS: None  WEIGHT BEARING RESTRICTIONS: No  PLOF: Independent  PATIENT GOALS: to get rid of the pain, to get on an exercise and stretching regimen for his overall health, sleep with less difficulty from pain.  Next MD appointment: July with Dr. Marcelino  TREATMENT                                                                                                                           Therapeutic exercise: therapeutic exercises that incorporate ONE parameter at one or more areas of the body to centralize symptoms, develop strength and endurance, range of motion, and flexibility required for successful completion of functional activities.   Seated cervical spine rotational SNAG with strap 2x12 each side   Seated cervical retractions into blue ball on wall for resistance:  3x12   Hooklying pec stretch and shoulder angels while laying on half foam roll along entire spine.  2x12 with 2#DB 1x6 last set (had to stop due to pain at anterior R shoulder) After each set of chest press   Superset:  Sidelying L shoulder ER with forearm supinated 1x12 with 2#DB  3x12 with 3#DB  R knee on mat to prevent compensatory roll Towel roll under L elbow Fatigue not pain   Standing L shoulder IR 2x12 with 15#cable 1x12 with 10#cable Cuing for head position and to decrease compensatory trunk rotation.    Therapeutic activities: dynamic therapeutic activities incorporating MULTIPLE parameters or areas of the body designed to achieve improved functional performance.   Dead hange to stretch lats, shoulders, back to improve overhead reaching 3x15-30 seconds R shoulder pain when moving to position first time Modified as needed with slight weight through feet   Hookling DB chest press with double plus while laying on half foam roll along spine (for improved pushing, working, exercising) 3x12 with  20#DB in each hand  Pain in  anterior R shoulder instantly with first rep (but not reported until after end of 3rd set) New grinding in R shoulder with protraction   Superset:  Standing row with scap retractions at [facility] (for improved pulling): 3x12 at 55#  Seated short arc R shoulder scaption (~80-120 deg) to improve reaching 3x12 Loses ROM last 2 reps due to fatigue   Superset:  Inclined B shoulder scaption on [employer] with gentle cervical spine retraction to improve posterior chain strength and coordination for reaching 3x12 with 2#DBs   Seated OMEGA Lat pull down (for improved pulling): 3x12 at 75#     Pt required multimodal cuing for proper technique and to facilitate improved neuromuscular control, strength, range of motion, and functional ability resulting in improved performance and form.  Mechanical Traction: to improve cervical spine and thoracic pain, and help decrease muscle tension.  Type: Cervical Min (lb): 0 Max (lb): 35  Hold time: 30 seconds Rest time: 10 seconds Pull angle: ~20 degrees Time: 15 min  plus set up and take down Result:  good tolerance during, no complaints after     PATIENT EDUCATION:  Education details: Exercise purpose/form. Self management techniques. Person educated: Patient Education method: Explanation, Demonstration Education comprehension: verbalized understanding and needs further education  HOME EXERCISE PROGRAM: Access Code: [facility] URL: [email or web address] Date: 08/15/2023 Prepared by: Camie Cleverly  Exercises - Cervical SNAG Rotation  - 3 x weekly - 2 sets - 12 reps - Standing Isometric Cervical Retraction with Chin Tucks and Ball at [employer]  - 3 x weekly - 3 sets - 12 reps - Supine Chest Press with Dumbbells  - 3 x weekly - 3 sets - 12 reps - [employer] on Foam Roll  - 3 x weekly - 3 sets - 12 reps - Sidelying Shoulder ER with Towel and Dumbbell  - 3 x weekly - 3 sets - 12 reps - Seated Single Arm Shoulder Scaption  - 3 x weekly - 3  sets - 12 reps - Lat Pull Down - Cable/Bar  - 3 x weekly - 3 sets - 12 reps - Row with band/cable  - 3 x weekly - 3 sets - 12 reps - Prone Lower Trapezius Strengthening on Swiss Ball with Dumbbells  - 3 x weekly - 3 sets - 12 reps - Standing Shoulder Internal Rotation with Anchored Resistance  - 3 x weekly - 3 sets - 12 reps  ASSESSMENT:  CLINICAL IMPRESSION: Patient with more difficulty with pressing exercise today due to R anterior shoulder pain (which he did not report until he completed all 3 sets). He also noted some new grinding in the right shoulder at that time. He tolerated other exercises well as he did cervical traction. Patient may benefit from re-assessment of mechanical stressors and sensitivities to those at the cervical spine. Suspect his home activities are causing irritation at the neck due to load, position, and/or nerve tension sensitivities. Also provided hand out of a neck hammock so he can try something similar at home.  Patient would benefit from continued management of limiting condition by skilled physical therapist to address remaining impairments and functional limitations to work towards stated goals and return to PLOF or maximal functional independence.    OBJECTIVE IMPAIRMENTS: decreased activity tolerance, decreased endurance, decreased knowledge of condition, decreased mobility, joint stiffness, joint instability, decreased ROM, decreased strength, hypomobility, impaired perceived functional ability, increased muscle spasms, impaired flexibility, impaired UE functional use, postural dysfunction, and  pain.   ACTIVITY LIMITATIONS: carrying, lifting, bending, sitting, standing, squatting, sleeping, transfers, bed mobility, bathing, dressing, hygiene/grooming, reaching overhead, stabilizing with B UE, locomotion level, and caring for others  PARTICIPATION LIMITATIONS: meal prep, cleaning, laundry, interpersonal relationship, driving, shopping, community activity, yard  work, and  Advertising account executive, working on projects, working on [employer], walking for exercise, sleeping, working in his shop, house maintenance and projects, doing as much as he wants to, regular exercise  PERSONAL FACTORS: Age, Past/current experiences, Time since onset of injury/illness/exacerbation, behavior,  and 3+ comorbidities: depression, bilateral shoulder surgery (RTC repair, R most recent 12/2020), bilateral TKA, subarachnoid hemorrhage 2017 with SAH coiling, HTN, hx of hepatitis C (cured), double hernia repair, plantar facial fibromatosis, chronic pain syndrome, former smoker, Chronic bilateral low back pain with bilateral sciatica; Acute bursitis of right shoulder; Lumbar facet arthropathy; Lumbar degenerative disc disease; Localized osteoarthritis of shoulder regions, bilateral; Chronic SI joint pain; Chronic pain syndrome; Pain in joint of left shoulder; Abnormal gait; Anatomical narrow angle; Biomechanical lesion; Cataract nuclear; Derangement of posterior horn of medial meniscus; Edema; Glaucoma suspect of both eyes; Hip pain; History of total knee arthroplasty; Localized, primary osteoarthritis; Hx of subarachnoid hemophage, Muscle weakness; Arthritis; Plantar fascial fibromatosis; Viral hepatitis C; Arthropathy of left wrist; Chronic pain of left heel; Intercostal neuralgia; and Closed fracture of rib of left side with routine healing,  has a past medical history of Arthritis, Hepatitis, Hypertension, Subarachnoid hemorrhage (HCC) (2017), and Wears hearing aid in both ears.  has a past surgical history that includes Brain surgery; Joint replacement (Bilateral); Shoulder arthroscopy with rotator cuff repair and open biceps tenodesis (Left, 09/08/2018); and Hernia repair (1963). Also had surgery on right shoulder are also affecting patient's functional outcome.   REHAB POTENTIAL: Good   CLINICAL DECISION MAKING: Evolving/moderate complexity  EVALUATION COMPLEXITY:  Moderate   GOALS: Goals reviewed with patient? No  SHORT TERM GOALS: Target date: 08/06/2023  Patient will be independent with initial home exercise program for self-management of symptoms. Baseline: Continue with HEP from last episode of care (07/23/23); Goal status: In progress  LONG TERM GOALS: Target date: 10/15/2023  Patient will be independent with a long-term home exercise program for self-management of symptoms.  Baseline: Continue with past HEP (07/23/23); participating irregularly Goal status: In progress  2.  Patient will demonstrate improved Upper Extremity Functional Scale (UEFS) to equal or greater than 64/80 to demonstrate improvement in overall condition and self-reported functional ability.  Baseline: to be measured at visit 2 as appropriate (07/23/23); not captured (08/29/23);  Goal status: ongoing  3.  Patient will successfully participate in a exercise program for health at least 3 days a week without exacerbation of symptoms.  Baseline: not participating and difficulty participating due to symptom exacerbation (07/23/23); participating irregularly (08/29/2023);  Goal status: In progress  4.  R shoulder strength will improve at least 4+/5 or more without increased pain to improve his ability to work on projects, home maintenance, and a fitness program.  Baseline: weak and painful - see objective (07/23/23); less painful and at least 1/2 MMT stronger - updated goal to more functional 4+/5 <<T (08/29/2023) Goal status: In progress  5.  Patient will demonstrate improvement in Patient Specific Functional Scale (PSFS) of equal or greater than 8/10 points for all three body parts to reflect clinically significant improvement in patient's most valued functional activities. Baseline: to be measured at visit 2 as appropriate (07/23/23); 8/10 (08/29/23);  Goal status: MET  6.  Patient will report NPRS  equal or less than 3/10 during functional activities during the last 2 weeks  to improve their abilitly to complete community, work and/or recreational activities with less limitation. Baseline: to be measured visit 2 as appropriate (07/23/23); 6/10 (08/29/2023);  Goal status: In progress  PLAN: PT FREQUENCY: 1-2x/week PT DURATION: 8-12 weeks PLANNED INTERVENTIONS: 97164- PT Re-evaluation, 97750- Physical Performance Testing, 97110-Therapeutic exercises, 97530- Therapeutic activity, [id number]- Neuromuscular re-education, 97535- Self Care, [id number]- Manual therapy, [id number]- Electrical stimulation (unattended), [phone]- Traction (mechanical), 20560 (1-2 muscles), 20561 (3+ muscles)- Dry Needling, Patient/Family education, Joint mobilization, Spinal mobilization, Cryotherapy, and Moist heat.  PLAN FOR NEXT SESSION: update HEP as appropriate, graded/progressive postural/UE/functional strengthening, core strengthening, manual therapy as needed, mechanical traction. Progressive R shoulder girdle and functional strengthening as tolerated.    Camie SAUNDERS. Juli, PT, DPT, Cert. MDT 09/10/23, 12:38 PM  [facility] [address] [place], [place] [address] P: [phone] I F: [phone]

## 2023-09-11 ENCOUNTER — Ambulatory Visit: Attending: Nurse Practitioner | Admitting: Nurse Practitioner

## 2023-09-11 ENCOUNTER — Encounter: Payer: Self-pay | Admitting: Nurse Practitioner

## 2023-09-11 VITALS — BP 134/96 | HR 77 | Temp 98.2°F | Ht 72.0 in | Wt 220.0 lb

## 2023-09-11 DIAGNOSIS — M542 Cervicalgia: Secondary | ICD-10-CM | POA: Insufficient documentation

## 2023-09-11 DIAGNOSIS — G8929 Other chronic pain: Secondary | ICD-10-CM | POA: Diagnosis present

## 2023-09-11 DIAGNOSIS — M47816 Spondylosis without myelopathy or radiculopathy, lumbar region: Secondary | ICD-10-CM | POA: Diagnosis not present

## 2023-09-11 DIAGNOSIS — Z79891 Long term (current) use of opiate analgesic: Secondary | ICD-10-CM | POA: Diagnosis present

## 2023-09-11 DIAGNOSIS — S2232XD Fracture of one rib, left side, subsequent encounter for fracture with routine healing: Secondary | ICD-10-CM | POA: Insufficient documentation

## 2023-09-11 DIAGNOSIS — M533 Sacrococcygeal disorders, not elsewhere classified: Secondary | ICD-10-CM | POA: Insufficient documentation

## 2023-09-11 DIAGNOSIS — Z79899 Other long term (current) drug therapy: Secondary | ICD-10-CM | POA: Insufficient documentation

## 2023-09-11 DIAGNOSIS — G894 Chronic pain syndrome: Secondary | ICD-10-CM | POA: Diagnosis present

## 2023-09-11 MED ORDER — OXYCODONE-ACETAMINOPHEN 7.5-325 MG PO TABS: 1.0000 | ORAL_TABLET | Freq: Three times a day (TID) | ORAL | 0 refills | Status: DC | PRN

## 2023-09-11 MED ORDER — OXYCODONE-ACETAMINOPHEN 7.5-325 MG PO TABS: 1.0000 | ORAL_TABLET | Freq: Three times a day (TID) | ORAL | 0 refills | Status: AC | PRN

## 2023-09-11 NOTE — Progress Notes (Signed)
 Nursing Pain Medication Assessment:  Safety precautions to be maintained throughout the outpatient stay will include: orient to surroundings, keep bed in low position, maintain call bell within reach at all times, provide assistance with transfer out of bed and ambulation.  Medication Inspection Compliance: Pill count conducted under aseptic conditions, in front of the patient. Neither the pills nor the bottle was removed from the patient's sight at any time. Once count was completed pills were immediately returned to the patient in their original bottle.  Medication: Oxycodone /APAP Pill/Patch Count: 33 of 90 pills/patches remain Pill/Patch Appearance: Markings consistent with prescribed medication Bottle Appearance: Standard pharmacy container. Clearly labeled. Filled Date: 7 / 5 / 2025 Last Medication intake:  TodaySafety precautions to be maintained throughout the outpatient stay will include: orient to surroundings, keep bed in low position, maintain call bell within reach at all times, provide assistance with transfer out of bed and ambulation.

## 2023-09-11 NOTE — Progress Notes (Signed)
 PROVIDER NOTE: Interpretation of information contained herein should be left to medically-trained personnel. Specific patient instructions are provided elsewhere under Patient Instructions section of medical record. This document was created in part using AI and STT-dictation technology, any transcriptional errors that may result from this process are unintentional.  Patient: Charles Mooney  Service: E/M   PCP: Sherial Bail, MD  DOB: [birth year]  DOS: 09/11/2023  Provider: Emmy MARLA Blanch, NP  MRN: [id number]  Delivery: Face-to-face  Specialty: Interventional Pain Management  Type: Established Patient  Setting: Ambulatory outpatient facility  Specialty designation: 09  Referring Prov.: Sherial Bail, MD  Location: Outpatient office facility       History of present illness (HPI) Mr. AFNAN EMBERTON, a 68 y.o. year old male, is here today because of his Chronic pain syndrome [G89.4]. Mr. Schaafsma's primary complain today is Back Pain (lower)  Pertinent problems: Mr. Gandolfi has  Chronic bilateral low back pain with bilateral sciatica; Acute bursitis of right shoulder; Nonallopathic lesion of sacral region; Lumbar facet arthropathy; Lumbar degenerative disc disease; Localized osteoarthritis of shoulder regions, Bilateral; Encounter for long-term opioid analgesics use; Chronic SI joint pain; and Chronic pain syndrome on their pertinent problem list.   Pain Assessment: Severity of Chronic pain is reported as a 4 /10. Location: Back Left, Right, Lower, Upper/pain radiaities left leg to his knee. Onset: More than a month ago. Quality: Aching. Timing: Constant. Modifying factor(s): Meds, heat, stretching, physical therapy. Vitals:  height is 6' (1.829 m) and weight is 220 lb (99.8 kg). His temperature is 98.2 F (36.8 C). His blood pressure is 134/96 (abnormal) and his pulse is 77. His oxygen saturation is 99%.  BMI: Estimated body mass index is 29.84 kg/m as calculated from the following:   Height  as of this encounter: 6' (1.829 m).   Weight as of this encounter: 220 lb (99.8 kg).  Last encounter: 08/14/2023. Last procedure: 03/04/2023  Reason for encounter: medication management. No change in medical history since last visit.  Patient's pain is at baseline.  Patient continues multimodal pain regimen as prescribed.  States that it provides pain relief and improvement in functional status.   He continues to experience chronic right shoulder pain despite undergoing physical therapy, although there is some improvement in range of motion and stability due to physical therapy.  He also has experienced a significant worsening of neck pain and chronic bilateral foot pain.   Due to significant and persistent pain, the patient had been taking more medication than prescribed and requested an increase in frequency.  After a detailed conversation, I educate the patient about the risk of narcotics tolerance and the importance of compliance with the prescribed regimen.  We agreed to continue the medication to Percocet 7.5-325 mg every 8 hours as needed with 90 days supply.   Pharmacotherapy Assessment   Analgesic: Oxycodone -acetaminophen  (Percocet) 7.5-325 mg tablet every 8 hours as needed for pain. MME=33 Monitoring: [place] PMP: PDMP reviewed during this encounter.       Pharmacotherapy: No side-effects or adverse reactions reported. Compliance: No problems identified. Effectiveness: Clinically acceptable.  Delores Dorothe LABOR, RN  09/11/2023  9:05 AM  Sign when Signing Visit Nursing Pain Medication Assessment:  Safety precautions to be maintained throughout the outpatient stay will include: orient to surroundings, keep bed in low position, maintain call bell within reach at all times, provide assistance with transfer out of bed and ambulation.  Medication Inspection Compliance: Pill count conducted under aseptic conditions, in front of the patient.  Neither the pills nor the bottle was removed from the  patient's sight at any time. Once count was completed pills were immediately returned to the patient in their original bottle.  Medication: Oxycodone /APAP Pill/Patch Count: 33 of 90 pills/patches remain Pill/Patch Appearance: Markings consistent with prescribed medication Bottle Appearance: Standard pharmacy container. Clearly labeled. Filled Date: 7 / 5 / 2025 Last Medication intake:  TodaySafety precautions to be maintained throughout the outpatient stay will include: orient to surroundings, keep bed in low position, maintain call bell within reach at all times, provide assistance with transfer out of bed and ambulation.     UDS:  Summary  Date Value Ref Range Status  08/14/2023 FINAL  Final    Comment:    ==================================================================== [employer] (MW) ==================================================================== Test                             Result       Flag       Units  Drug Present and Declared for Prescription Verification   Oxycodone                       485          EXPECTED   ng/mg creat   Oxymorphone                    815          EXPECTED   ng/mg creat   Noroxycodone                   3271         EXPECTED   ng/mg creat   Noroxymorphone                 282          EXPECTED   ng/mg creat    Sources of oxycodone  are scheduled prescription medications.    Oxymorphone, noroxycodone, and noroxymorphone are expected    metabolites of oxycodone . Oxymorphone is also available as a    scheduled prescription medication.  ==================================================================== Test                      Result    Flag   Units      Ref Range   Creatinine              34               mg/dL      >=79 ==================================================================== Declared Medications:  The flagging and interpretation on this report are based on the  following declared medications.  Unexpected results may  arise from  inaccuracies in the declared medications.   **Note: The testing scope of this panel includes these medications:   Oxycodone    **Note: The testing scope of this panel does not include the  following reported medications:   Acetaminophen   Amlodipine  Cannabidiol  Citalopram  Methocarbamol   Trazodone ==================================================================== For clinical consultation, please call [phone]. ====================================================================     No results found for: CBDTHCR No results found for: D8THCCBX No results found for: D9THCCBX  ROS  Constitutional: Denies any fever or chills Gastrointestinal: No reported hemesis, hematochezia, vomiting, or acute GI distress Musculoskeletal: Lower back pain Neurological: No reported episodes of acute onset apraxia, aphasia, dysarthria, agnosia, amnesia, paralysis, loss of coordination, or loss of consciousness  Medication Review  NON FORMULARY, amLODipine, citalopram,  methocarbamol , oxyCODONE -acetaminophen , and traZODone  History Review  Allergy: Mr. Mcguirt is allergic to sulfa antibiotics. Drug: Mr. Turman  reports that he does not currently use drugs. Alcohol:  reports current alcohol use of about 18.0 standard drinks of alcohol per week. Tobacco:  reports that he has been smoking cigarettes. He has never used smokeless tobacco. Social: Mr. Chaffin  reports that he has been smoking cigarettes. He has never used smokeless tobacco. He reports current alcohol use of about 18.0 standard drinks of alcohol per week. He reports that he does not currently use drugs. Medical:  has a past medical history of Arthritis, Hepatitis, Hypertension, Subarachnoid hemorrhage (HCC) (2017), and Wears hearing aid in both ears. Surgical: Mr. Petrow  has a past surgical history that includes Brain surgery; Joint replacement (Bilateral); Shoulder arthroscopy with rotator cuff repair and  open biceps tenodesis (Left, 09/08/2018); and Hernia repair (1963). Family: family history is not on file.  Laboratory Chemistry Profile   Renal Lab Results  Component Value Date   BUN 18 09/04/2018   CREATININE 0.81 09/04/2018   GFRAA >60 09/04/2018   GFRNONAA >60 09/04/2018    Hepatic Lab Results  Component Value Date   AST 18 09/04/2018   ALT 20 09/04/2018   ALBUMIN 4.4 09/04/2018   ALKPHOS 39 09/04/2018    Electrolytes Lab Results  Component Value Date   NA 138 09/04/2018   K 3.9 09/04/2018   CL 105 09/04/2018   CALCIUM 9.2 09/04/2018    Bone No results found for: VD25OH, VD125OH2TOT, CI6874NY7, CI7874NY7, 25OHVITD1, 25OHVITD2, 25OHVITD3, TESTOFREE, TESTOSTERONE  Inflammation (CRP: Acute Phase) (ESR: Chronic Phase) No results found for: CRP, ESRSEDRATE, LATICACIDVEN       Note: Above Lab results reviewed.  Recent Imaging Review  DG PAIN CLINIC C-ARM 1-60 MIN NO REPORT Fluoro was used, but no Radiologist interpretation will be provided.  Please refer to NOTES tab for provider progress note. Note: Reviewed        Physical Exam  Vitals: BP (!) 134/96   Pulse 77   Temp 98.2 F (36.8 C)   Ht 6' (1.829 m)   Wt 220 lb (99.8 kg)   SpO2 99%   BMI 29.84 kg/m  BMI: Estimated body mass index is 29.84 kg/m as calculated from the following:   Height as of this encounter: 6' (1.829 m).   Weight as of this encounter: 220 lb (99.8 kg). Ideal: Ideal body weight: 77.6 kg (171 lb 1.2 oz) Adjusted ideal body weight: 86.5 kg (190 lb 10.3 oz) General appearance: Well nourished, well developed, and well hydrated. In no apparent acute distress Mental status: Alert, oriented x 3 (person, place, & time)       Respiratory: No evidence of acute respiratory distress Eyes: PERLA  Assessment   Diagnosis Status  1. Chronic pain syndrome   2. Cervicalgia   3. Medication management   4. Lumbar facet arthropathy   5. Encounter for long-term opiate  analgesic use   6. Chronic SI joint pain   7. Closed fracture of one rib of left side with routine healing, subsequent encounter    Controlled Controlled Controlled   Updated Problems: No problems updated.  Plan of Care  Problem-specific:  Assessment and Plan We will continue on current medication regimen.  Prescribing drug monitoring (PMP) reviewed; findings consistent with the use of prescribed medication and no evidence of narcotic misuse or abuse.  Urine drug screening (UDS) up-to-date.  No other new issues or problems reported to this visit.  Schedule follow-up in 90 days for medication management.   Mr. HAGEN BOHORQUEZ has a current medication list which includes the following long-term medication(s): amlodipine and citalopram.  Pharmacotherapy (Medications Ordered): Meds ordered this encounter  Medications   oxyCODONE -acetaminophen  (PERCOCET) 7.5-325 MG tablet    Sig: Take 1 tablet by mouth every 8 (eight) hours as needed for moderate pain (pain score 4-6) or severe pain (pain score 7-10). Must last 30 days.    Dispense:  90 tablet    Refill:  0    Chronic Pain: STOP Act (Not applicable) Fill 1 day early if closed on refill date. Avoid benzodiazepines within 8 hours of opioids   oxyCODONE -acetaminophen  (PERCOCET) 7.5-325 MG tablet    Sig: Take 1 tablet by mouth every 8 (eight) hours as needed for moderate pain (pain score 4-6) or severe pain (pain score 7-10). Must last 30 days.    Dispense:  90 tablet    Refill:  0    Chronic Pain: STOP Act (Not applicable) Fill 1 day early if closed on refill date. Avoid benzodiazepines within 8 hours of opioids   oxyCODONE -acetaminophen  (PERCOCET) 7.5-325 MG tablet    Sig: Take 1 tablet by mouth every 8 (eight) hours as needed for moderate pain (pain score 4-6) or severe pain (pain score 7-10). Must last 30 days.    Dispense:  90 tablet    Refill:  0    Chronic Pain: STOP Act (Not applicable) Fill 1 day early if closed on refill date.  Avoid benzodiazepines within 8 hours of opioids   Orders:  No orders of the defined types were placed in this encounter.       Return in about 3 months (around 12/12/2023) for (F2F), (MM), Emmy Blanch NP.    Recent Visits Date Type Provider Dept  08/14/23 Office Visit Jearld Hemp K, NP [facility]  Showing recent visits within past 90 days and meeting all other requirements Today's Visits Date Type Provider Dept  09/11/23 Office Visit Molina Hollenback K, NP [facility]  Showing today's visits and meeting all other requirements Future Appointments Date Type Provider Dept  12/09/23 Appointment Sharlotte Baka K, NP [facility]  Showing future appointments within next 90 days and meeting all other requirements  I discussed the assessment and treatment plan with the patient. The patient was provided an opportunity to ask questions and all were answered. The patient agreed with the plan and demonstrated an understanding of the instructions.  Patient advised to call back or seek an in-person evaluation if the symptoms or condition worsens.  Duration of encounter: 30 minutes.  Total time on encounter, as per AMA guidelines included both the face-to-face and non-face-to-face time personally spent by the physician and/or other qualified health care professional(s) on the day of the encounter (includes time in activities that require the physician or other qualified health care professional and does not include time in activities normally performed by clinical staff). Physician's time may include the following activities when performed: Preparing to see the patient (e.g., pre-charting review of records, searching for previously ordered imaging, lab work, and nerve conduction tests) Review of prior analgesic pharmacotherapies. Reviewing PMP Interpreting ordered tests (e.g., lab work, imaging, nerve conduction tests) Performing post-procedure evaluations, including  interpretation of diagnostic procedures Obtaining and/or reviewing separately obtained history Performing a medically appropriate examination and/or evaluation Counseling and educating the patient/family/caregiver Ordering medications, tests, or procedures Referring and communicating with other health care professionals (when not separately reported) Documenting  clinical information in the electronic or other health record Independently interpreting results (not separately reported) and communicating results to the patient/ family/caregiver Care coordination (not separately reported)  Note by: Devonne Lalani K Derrall Hicks, NP (TTS and AI technology used. I apologize for any typographical errors that were not detected and corrected.) Date: 09/11/2023; Time: 9:59 AM

## 2023-09-12 ENCOUNTER — Encounter: Payer: Self-pay | Admitting: Physical Therapy

## 2023-09-12 ENCOUNTER — Ambulatory Visit: Admitting: Physical Therapy

## 2023-09-12 DIAGNOSIS — M25511 Pain in right shoulder: Secondary | ICD-10-CM

## 2023-09-12 DIAGNOSIS — M546 Pain in thoracic spine: Secondary | ICD-10-CM

## 2023-09-12 DIAGNOSIS — M6281 Muscle weakness (generalized): Secondary | ICD-10-CM

## 2023-09-12 DIAGNOSIS — M542 Cervicalgia: Secondary | ICD-10-CM

## 2023-09-12 NOTE — Therapy (Signed)
 OUTPATIENT PHYSICAL THERAPY TREATMENT   Patient Name: Charles Mooney MRN: [id number] DOB:[birth year], 68 y.o., male Today's Date: 09/12/2023  END OF SESSION:  PT End of Session - 09/12/23 0953     Visit Number 13    Number of Visits 17    Date for PT Re-Evaluation 10/15/23    Authorization Type MEDICARE PART B reporting period from 07/23/2023    Progress Note Due on Visit 20    PT Start Time 0952    PT Stop Time 1034    PT Time Calculation (min) 42 min    Activity Tolerance Patient tolerated treatment well    Behavior During Therapy [facility] for tasks assessed/performed               Past Medical History:  Diagnosis Date   Arthritis    Hepatitis    c 2000   Hypertension    Subarachnoid hemorrhage (HCC) 2017   Wears hearing aid in both ears    Past Surgical History:  Procedure Laterality Date   BRAIN SURGERY     [facility] COILING 2017   HERNIA REPAIR  1963   double hernia repair   JOINT REPLACEMENT Bilateral    TKR  L-2012, R-2015   SHOULDER ARTHROSCOPY WITH ROTATOR CUFF REPAIR AND OPEN BICEPS TENODESIS Left 09/08/2018   Procedure: LEFT SHOULDER ARTHROSCOPY,SUBSACAPULARIS REPAIR, SUBACROMIAL DECOMP, DISTAL CLAVICLE EXCISION,BICEP TENODESIS, MINI OPEN REGENTEN PATCH APPLICATION;  Surgeon: Tobie Priest, MD;  Location: [facility] ORS;  Service: Orthopedics;  Laterality: Left;   Patient Active Problem List   Diagnosis Date Noted   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   Closed fracture of rib of left side with routine healing 09/04/2022   Chronic pain of left heel 08/31/2021   Arthropathy of left wrist 10/12/2020   Viral hepatitis C 11/02/2019   Lumbar facet arthropathy 04/27/2019   Lumbar degenerative disc disease 04/27/2019   Localized osteoarthritis of shoulder regions, bilateral 04/27/2019   Encounter for long-term opiate analgesic use 04/27/2019   Chronic SI joint pain 04/27/2019   Chronic pain syndrome 04/27/2019   Pain in joint of left shoulder 06/03/2018   Abnormal gait  06/18/2017   Anatomical narrow angle 06/18/2017   Derangement of posterior horn of medial meniscus 06/18/2017   Edema 06/18/2017   Hip pain 06/18/2017   History of total knee arthroplasty 06/18/2017   Localized, primary osteoarthritis 06/18/2017   Muscle weakness 06/18/2017   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   Acute bursitis of right shoulder 06/14/2017   Nonallopathic lesion of sacral region 06/14/2017   Nonallopathic lesion of thoracic region 06/14/2017   Nonallopathic lesion of lumbosacral region 06/14/2017   Biomechanical lesion 06/14/2017   Chronic bilateral low back pain with bilateral sciatica 05/23/2017   Arthritis 04/19/2013   Cataract nuclear 06/16/2012   Glaucoma suspect of both eyes 06/16/2012    PCP: Sherial Bail, MD  REFERRING PROVIDER: Marcelino Nurse, MD  REFERRING DIAG: cervicalgia, arthritis of right shoulder region, myofascial pain syndrome of thoracic spine  Rationale for Evaluation and Treatment: Rehabilitation  THERAPY DIAG:  Right shoulder pain, unspecified chronicity  Cervicalgia  Muscle weakness (generalized)  Pain in thoracic spine  ONSET DATE: Neck pain and shoulder pain worsened since January 2025, October 2024 for right scapular pain (approximately 3 months after rib fracture in July 2024)  SUBJECTIVE:  PERTINENT HISTORY:  Patient is a 68 y.o. male who presents to outpatient physical therapy with a referral for medical diagnosis of cervicalgia, arthritis of right shoulder region, myofascial pain syndrome of thoracic spine. This patient's chief complaints consist of acute on chronic R shoulder pain and popping, neck pain, and R shoulder pain leading to the following functional deficits: limits him with everyday activities/tasks to some amount, working on  projects, working on the computer, walking for exercise, sleeping, working in his shop, house maintenance and projects, doing as much as he wants to, regular exercise.   Relevant past medical history and comorbidities include bilateral shoulder surgery (RTC repair, R most recent 12/2020) and subarachnoid hemorrhage in 2017, depression. Patient denies hx of cancer, seizures, unexplained weight loss, unexplained changes in bowel or bladder problems, unexplained stumbling or dropping things, back surgery.   Exercise history: he used to do bench presses, flies, bent over rows, lat pull in front and behind head, curls, triceps extensions overhead. He worked out at home 2-3/x a week but he always struggled to be consistent. 45-60 minutes. He stopped approx 10 years ago when he started building his current house. Then he had a stroke and then it was all he could do to work. He did not have any extra energy or willpower at that point. He is currently doing all of this for his grandkids.   He struggles with depression, Sees someone for talk therapy 1x a month (50 minutes) with the same person. He is limited by the fact he has to pay out of pocket. He takes celexa and his GP switched him to Wellbutrin. He did not like this so he went back to celexa. He has not tried any other medications for that. He had hepatitis C in the early 90s. He was put on interferon and rivovinan. That caused some really serious depression and was put on something then but cannot remember what it was (it might have been amitriptyline, and it was wild). He feels his depression got fully blown and noticeable about 3 years ago, around the time he got his shoulder done. At that time he made a decision to move at that time and it really triggered it because he built the house he is in now and he had planned to retire there. He has not moved yet, but he is currently packing. He also feels like his depression is also related to not being in shape. He  thinks if he could get his pain and physical conditioning under control, his depression would be much better.   SUBJECTIVE STATEMENT: Patient states he did not get much sleep last night due to neck pain, and he is unsure how he is today. He states his neck pain was a little better after last PT session.   PAIN: Are you having pain?  NPRS: 5/10 center neck, 4/10 Rt shoulder, 3/10 R periscapular region     FUNCTIONAL LIMITATIONS:limits him with everyday activities/tasks to some amount, working on projects, working on the computer, walking for exercise, sleeping, working in his shop, house maintenance and projects, doing as much as he wants to, regular exercise  LEISURE: working in the shop, building and fixing things  PRECAUTIONS: None  WEIGHT BEARING RESTRICTIONS: No  PLOF: Independent  PATIENT GOALS: to get rid of the pain, to get on an exercise and stretching regimen for his overall health, sleep with less difficulty from pain.  Next MD appointment: July with Dr. Marcelino  TREATMENT  Therapeutic exercise: therapeutic exercises that incorporate ONE parameter at one or more areas of the body to centralize symptoms, develop strength and endurance, range of motion, and flexibility required for successful completion of functional activities.   UBE L11, 3 min forward, 3 min backward.   Segmental self mobilization of thoracic gapping/extension in hooklying figure 4 and foam roll horizontally behind thoracic spine extending over it while supporting head with hands to prevent excessive mid cervical spine extension.  1x10 at 3 levels  Sidelying open book hold with breathing 1x10 breaths L rotation 2x10 breaths R rotation (tighter)  Education on HEP including handout   Self-Care/Home Management Training: to educate patient in self care including ADL training, meal  preparation, compensatory training, safety procedures/instructions, use of assistive technology devices or adaptive equipment.   Trial and education on how to set up a pillow case and band for self home traction for pain relief. Tried several set ups and arrived at double pillow case with SilverTB or just a really strong strengthening band with pillow under head to prevent hyperextension at mid cervical spine. Patient able to lay supine comfortably with appropriate level of traction. Provided SilverTB for home use.   Manual therapy: to reduce pain and tissue tension, improve range of motion, neuromodulation, in order to promote improved ability to complete functional activities.  Hooklying hips lifted HVLA thrust to thoracic spine 1 rep with pistol grip near T4 1 rep with KE wedge near T4 No cavitation  Upper cervical spine flexion PROM with overpressure.    Pt required multimodal cuing for proper technique and to facilitate improved neuromuscular control, strength, range of motion, and functional ability resulting in improved performance and form.   PATIENT EDUCATION:  Education details: Exercise purpose/form. Self management techniques. Person educated: Patient Education method: Explanation, Demonstration Education comprehension: verbalized understanding and needs further education  HOME EXERCISE PROGRAM: Access Code: [facility] URL: [email or web address] Date: 09/12/2023 Prepared by: Camie Cleverly  Exercises - Sidelying Open Book  - 2 x daily - 10 breaths l side, 20 breaths r side (stiffer side) hold - Cervical SNAG Rotation  - 3 x weekly - 2 sets - 12 reps - Standing Isometric Cervical Retraction with Chin Tucks and Ball at [employer]  - 3 x weekly - 3 sets - 12 reps - Supine Chest Press with Dumbbells  - 3 x weekly - 3 sets - 12 reps - [place] on Foam Roll  - 3 x weekly - 3 sets - 12 reps - Sidelying Shoulder ER with Towel and Dumbbell  - 3 x weekly - 3 sets - 12 reps -  Seated Single Arm Shoulder Scaption  - 3 x weekly - 3 sets - 12 reps - Lat Pull Down - Cable/Bar  - 3 x weekly - 3 sets - 12 reps - Row with band/cable  - 3 x weekly - 3 sets - 12 reps - Standing Shoulder Internal Rotation with Anchored Resistance  - 3 x weekly - 3 sets - 12 reps  ASSESSMENT:  CLINICAL IMPRESSION: Today's session focused on providing patient with education and trial of a home set up for traction to help him get the relief he feels from traction a home. Patient appeared to understand how to use the equipment and reported feeling a good pull similar to the mechanical traction machine. Also worked on improving motion in the upper thoracic spine, which continues to be stiff. Patient reported feeling the same when he left as when he arrived. HEP updated to  have less overhead arm movement and to include exercise for upper thoracic spine mobility. Patient has not been doing any HEP. Patient would benefit from continued management of limiting condition by skilled physical therapist to address remaining impairments and functional limitations to work towards stated goals and return to PLOF or maximal functional independence.   OBJECTIVE IMPAIRMENTS: decreased activity tolerance, decreased endurance, decreased knowledge of condition, decreased mobility, joint stiffness, joint instability, decreased ROM, decreased strength, hypomobility, impaired perceived functional ability, increased muscle spasms, impaired flexibility, impaired UE functional use, postural dysfunction, and pain.   ACTIVITY LIMITATIONS: carrying, lifting, bending, sitting, standing, squatting, sleeping, transfers, bed mobility, bathing, dressing, hygiene/grooming, reaching overhead, stabilizing with B UE, locomotion level, and caring for others  PARTICIPATION LIMITATIONS: meal prep, cleaning, laundry, interpersonal relationship, driving, shopping, community activity, yard work, and  Advertising account executive, working on projects,  working on [employer], walking for exercise, sleeping, working in his shop, house maintenance and projects, doing as much as he wants to, regular exercise  PERSONAL FACTORS: Age, Past/current experiences, Time since onset of injury/illness/exacerbation, behavior,  and 3+ comorbidities: depression, bilateral shoulder surgery (RTC repair, R most recent 12/2020), bilateral TKA, subarachnoid hemorrhage 2017 with SAH coiling, HTN, hx of hepatitis C (cured), double hernia repair, plantar facial fibromatosis, chronic pain syndrome, former smoker, Chronic bilateral low back pain with bilateral sciatica; Acute bursitis of right shoulder; Lumbar facet arthropathy; Lumbar degenerative disc disease; Localized osteoarthritis of shoulder regions, bilateral; Chronic SI joint pain; Chronic pain syndrome; Pain in joint of left shoulder; Abnormal gait; Anatomical narrow angle; Biomechanical lesion; Cataract nuclear; Derangement of posterior horn of medial meniscus; Edema; Glaucoma suspect of both eyes; Hip pain; History of total knee arthroplasty; Localized, primary osteoarthritis; Hx of subarachnoid hemophage, Muscle weakness; Arthritis; Plantar fascial fibromatosis; Viral hepatitis C; Arthropathy of left wrist; Chronic pain of left heel; Intercostal neuralgia; and Closed fracture of rib of left side with routine healing,  has a past medical history of Arthritis, Hepatitis, Hypertension, Subarachnoid hemorrhage (HCC) (2017), and Wears hearing aid in both ears.  has a past surgical history that includes Brain surgery; Joint replacement (Bilateral); Shoulder arthroscopy with rotator cuff repair and open biceps tenodesis (Left, 09/08/2018); and Hernia repair (1963). Also had surgery on right shoulder are also affecting patient's functional outcome.   REHAB POTENTIAL: Good   CLINICAL DECISION MAKING: Evolving/moderate complexity  EVALUATION COMPLEXITY: Moderate   GOALS: Goals reviewed with patient? No  SHORT TERM GOALS:  Target date: 08/06/2023  Patient will be independent with initial home exercise program for self-management of symptoms. Baseline: Continue with HEP from last episode of care (07/23/23); Goal status: In progress  LONG TERM GOALS: Target date: 10/15/2023  Patient will be independent with a long-term home exercise program for self-management of symptoms.  Baseline: Continue with past HEP (07/23/23); participating irregularly Goal status: In progress  2.  Patient will demonstrate improved Upper Extremity Functional Scale (UEFS) to equal or greater than 64/80 to demonstrate improvement in overall condition and self-reported functional ability.  Baseline: to be measured at visit 2 as appropriate (07/23/23); not captured (08/29/23);  Goal status: ongoing  3.  Patient will successfully participate in a exercise program for health at least 3 days a week without exacerbation of symptoms.  Baseline: not participating and difficulty participating due to symptom exacerbation (07/23/23); participating irregularly (08/29/2023);  Goal status: In progress  4.  R shoulder strength will improve at least 4+/5 or more without increased pain to improve his ability to work on projects, home  maintenance, and a fitness program.  Baseline: weak and painful - see objective (07/23/23); less painful and at least 1/2 MMT stronger - updated goal to more functional 4+/5 <<T (08/29/2023) Goal status: In progress  5.  Patient will demonstrate improvement in Patient Specific Functional Scale (PSFS) of equal or greater than 8/10 points for all three body parts to reflect clinically significant improvement in patient's most valued functional activities. Baseline: to be measured at visit 2 as appropriate (07/23/23); 8/10 (08/29/23);  Goal status: MET  6.  Patient will report NPRS equal or less than 3/10 during functional activities during the last 2 weeks to improve their abilitly to complete community, work and/or recreational  activities with less limitation. Baseline: to be measured visit 2 as appropriate (07/23/23); 6/10 (08/29/2023);  Goal status: In progress  PLAN: PT FREQUENCY: 1-2x/week PT DURATION: 8-12 weeks PLANNED INTERVENTIONS: 97164- PT Re-evaluation, 97750- Physical Performance Testing, 97110-Therapeutic exercises, 97530- Therapeutic activity, [id number]- Neuromuscular re-education, 97535- Self Care, [id number]- Manual therapy, [id number]- Electrical stimulation (unattended), [phone]- Traction (mechanical), 20560 (1-2 muscles), 20561 (3+ muscles)- Dry Needling, Patient/Family education, Joint mobilization, Spinal mobilization, Cryotherapy, and Moist heat.  PLAN FOR NEXT SESSION: update HEP as appropriate, graded/progressive postural/UE/functional strengthening, core strengthening, manual therapy as needed, mechanical traction. Progressive R shoulder girdle and functional strengthening as tolerated.    Camie SAUNDERS. Juli, PT, DPT, Cert. MDT 09/12/23, 2:06 PM  [facility] [address] [place], [place] [address] P: [phone] I F: [phone]

## 2023-09-16 ENCOUNTER — Other Ambulatory Visit: Payer: Self-pay | Admitting: Student in an Organized Health Care Education/Training Program

## 2023-09-17 ENCOUNTER — Ambulatory Visit: Admitting: Physical Therapy

## 2023-09-17 ENCOUNTER — Encounter: Payer: Self-pay | Admitting: Physical Therapy

## 2023-09-17 DIAGNOSIS — M6281 Muscle weakness (generalized): Secondary | ICD-10-CM

## 2023-09-17 DIAGNOSIS — M546 Pain in thoracic spine: Secondary | ICD-10-CM

## 2023-09-17 DIAGNOSIS — M25511 Pain in right shoulder: Secondary | ICD-10-CM

## 2023-09-17 DIAGNOSIS — M542 Cervicalgia: Secondary | ICD-10-CM

## 2023-09-17 NOTE — Therapy (Unsigned)
 OUTPATIENT PHYSICAL THERAPY TREATMENT   Patient Name: Charles Mooney MRN: [id number] DOB:1955/04/09, 68 y.o., male Today's Date: 09/17/2023  END OF SESSION:  PT End of Session - 09/17/23 2106     Visit Number 14    Number of Visits 17    Date for PT Re-Evaluation 10/15/23    Authorization Type MEDICARE PART B reporting period from 07/23/2023    Progress Note Due on Visit 20    PT Start Time 0946    PT Stop Time 1050    PT Time Calculation (min) 64 min    Activity Tolerance Patient tolerated treatment well    Behavior During Therapy [facility] for tasks assessed/performed           Past Medical History:  Diagnosis Date   Arthritis    Hepatitis    c 2000   Hypertension    Subarachnoid hemorrhage (HCC) 2017   Wears hearing aid in both ears    Past Surgical History:  Procedure Laterality Date   BRAIN SURGERY     [facility] COILING 2017   HERNIA REPAIR  1963   double hernia repair   JOINT REPLACEMENT Bilateral    TKR  L-2012, R-2015   SHOULDER ARTHROSCOPY WITH ROTATOR CUFF REPAIR AND OPEN BICEPS TENODESIS Left 09/08/2018   Procedure: LEFT SHOULDER ARTHROSCOPY,SUBSACAPULARIS REPAIR, SUBACROMIAL DECOMP, DISTAL CLAVICLE EXCISION,BICEP TENODESIS, MINI OPEN REGENTEN PATCH APPLICATION;  Surgeon: Tobie Priest, MD;  Location: [facility] ORS;  Service: Orthopedics;  Laterality: Left;   Patient Active Problem List   Diagnosis Date Noted   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   Closed fracture of rib of left side with routine healing 09/04/2022   Chronic pain of left heel 08/31/2021   Arthropathy of left wrist 10/12/2020   Viral hepatitis C 11/02/2019   Lumbar facet arthropathy 04/27/2019   Lumbar degenerative disc disease 04/27/2019   Localized osteoarthritis of shoulder regions, bilateral 04/27/2019   Encounter for long-term opiate analgesic use 04/27/2019   Chronic SI joint pain 04/27/2019   Chronic pain syndrome 04/27/2019   Pain in joint of left shoulder 06/03/2018   Abnormal gait 06/18/2017    Anatomical narrow angle 06/18/2017   Derangement of posterior horn of medial meniscus 06/18/2017   Edema 06/18/2017   Hip pain 06/18/2017   History of total knee arthroplasty 06/18/2017   Localized, primary osteoarthritis 06/18/2017   Muscle weakness 06/18/2017   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   Acute bursitis of right shoulder 06/14/2017   Nonallopathic lesion of sacral region 06/14/2017   Nonallopathic lesion of thoracic region 06/14/2017   Nonallopathic lesion of lumbosacral region 06/14/2017   Biomechanical lesion 06/14/2017   Chronic bilateral low back pain with bilateral sciatica 05/23/2017   Arthritis 04/19/2013   Cataract nuclear 06/16/2012   Glaucoma suspect of both eyes 06/16/2012    PCP: Sherial Bail, MD  REFERRING PROVIDER: Marcelino Nurse, MD  REFERRING DIAG: cervicalgia, arthritis of right shoulder region, myofascial pain syndrome of thoracic spine  Rationale for Evaluation and Treatment: Rehabilitation  THERAPY DIAG:  Right shoulder pain, unspecified chronicity  Cervicalgia  Muscle weakness (generalized)  Pain in thoracic spine  ONSET DATE: Neck pain and shoulder pain worsened since January 2025, October 2024 for right scapular pain (approximately 3 months after rib fracture in July 2024)  SUBJECTIVE:  PERTINENT HISTORY:  Patient is a 68 y.o. male who presents to outpatient physical therapy with a referral for medical diagnosis of cervicalgia, arthritis of right shoulder region, myofascial pain syndrome of thoracic spine. This patient's chief complaints consist of acute on chronic R shoulder pain and popping, neck pain, and R shoulder pain leading to the following functional deficits: limits him with everyday activities/tasks to some amount, working on projects,  working on the computer, walking for exercise, sleeping, working in his shop, house maintenance and projects, doing as much as he wants to, regular exercise.   Relevant past medical history and comorbidities include bilateral shoulder surgery (RTC repair, R most recent 12/2020) and subarachnoid hemorrhage in 2017, depression. Patient denies hx of cancer, seizures, unexplained weight loss, unexplained changes in bowel or bladder problems, unexplained stumbling or dropping things, back surgery.   Exercise history: he used to do bench presses, flies, bent over rows, lat pull in front and behind head, curls, triceps extensions overhead. He worked out at home 2-3/x a week but he always struggled to be consistent. 45-60 minutes. He stopped approx 10 years ago when he started building his current house. Then he had a stroke and then it was all he could do to work. He did not have any extra energy or willpower at that point. He is currently doing all of this for his grandkids.   He struggles with depression, Sees someone for talk therapy 1x a month (50 minutes) with the same person. He is limited by the fact he has to pay out of pocket. He takes celexa and his GP switched him to Wellbutrin. He did not like this so he went back to celexa. He has not tried any other medications for that. He had hepatitis C in the early 90s. He was put on interferon and rivovinan. That caused some really serious depression and was put on something then but cannot remember what it was (it might have been amitriptyline, and it was wild). He feels his depression got fully blown and noticeable about 3 years ago, around the time he got his shoulder done. At that time he made a decision to move at that time and it really triggered it because he built the house he is in now and he had planned to retire there. He has not moved yet, but he is currently packing. He also feels like his depression is also related to not being in shape. He thinks if  he could get his pain and physical conditioning under control, his depression would be much better.   SUBJECTIVE STATEMENT: Patient states he is doing okay but he thinks he broke his 4th DIP on the right hand. 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 His wife wants him to go to the doctor to get it checked out, but he wants to wait to see if it gets better. He had it in a splint before he got here but it was bothering him so he took it off. He has not done any HEP after last PT session but he has been extremely active. He has felt okay with this level of activity. He does not remember how he felt after last PT session. He has not figured out how to set up traction at home.   PAIN: Are you having pain?  NPRS: 5/10 center neck, 4/10 Rt shoulder, 3/10 R periscapular region, 7/10 at right 4th DIP joint of the  hand.      FUNCTIONAL LIMITATIONS:limits him with everyday activities/tasks to some amount, working on projects, working on the computer, walking for exercise, sleeping, working in his shop, house maintenance and projects, doing as much as he wants to, regular exercise  LEISURE: working in the shop, building and fixing things  PRECAUTIONS: None  WEIGHT BEARING RESTRICTIONS: No  PLOF: Independent  PATIENT GOALS: to get rid of the pain, to get on an exercise and stretching regimen for his overall health, sleep with less difficulty from pain.  Next MD appointment: July with Dr. Marcelino  OBJECTIVE  UE DTRs equal bilaterally  STRENGTH:  Shoulder  Flexion: R = @@/5, L = @@/5.  Abduction: R = @@/5, L = @@/5. External rotation: R = 3/5, L = 4+/5. Internal rotation: R = @@/5, L = @@/5. Elbow Flexion: R = 4/5 fatigues a bit faster, L = 4/5. Fatigues bilaterally Extension: R = 4/5, L = 5/5.  Wrist/Hand MCP joint extension: R = 3/5 unable due to pain in finger, L 3+/5 Digiti-minimi (only 2 reps due to pain at R  4th digit): R = 3+/5 pain at 4th digit, L = 3+/5 Thumb ext: R = 3/5, L 3+/5 Flexion: R = @@/5, L = @@/5. Extension: R = @@/5, L = @@/5. Abduction: R = @@/5, L = @@/5. Adduction: Flex: R = @@/5, L = @@/5. Knee Ext: R = @@/5, L = @@/5. Flex: R = @@/5, L = @@/5. Ankle (seated position) Dorsiflexion: R = @@/5, L = @@/5. Plantarflexion: R = @@/5, L = @@/5. Eversion: R = @@/5, L = @@/5. Eversion: R = @@/5, L = @@/5. Pronation: R = @@/5, L = @@/5.    TREATMENT                                                                                                                           Therapeutic exercise: therapeutic exercises that incorporate ONE parameter at one or more areas of the body to centralize symptoms, develop strength and endurance, range of motion, and flexibility required for successful completion of functional activities.   UBE L11, 3 min forward, 3 min backward.   Segmental self mobilization of thoracic gapping/extension in hooklying figure 4 and foam roll horizontally behind thoracic spine extending over it while supporting head with hands to prevent excessive mid cervical spine extension.  1x10 at 3 levels  Sidelying open book hold with breathing 1x10 breaths L rotation 2x10 breaths R rotation (tighter)  Education on HEP including handout   Neuromuscular Re-education: a technique or exercise performed with the goal of improving the level of communication between the body and the brain, such as for balance, motor control, muscle activation patterns, coordination, desensitization, quality of muscle contraction, proprioception, and/or kinesthetic sense needed for successful and safe completion of functional activities.  DTR testing (see above) 5 min  Self-Care/Home Management Training: to educate patient in self care including ADL training, meal preparation, compensatory training, safety procedures/instructions,  use of assistive technology devices or adaptive equipment.    Trial and education on how to set upsleeping position.   Manual therapy: to reduce pain and tissue tension, improve range of motion, neuromodulation, in order to promote improved ability to complete functional activities.  Hooklying hips lifted HVLA thrust to thoracic spine 1 rep with pistol grip near T4 1 rep with KE wedge near T4 No cavitation  Upper cervical spine flexion PROM with overpressure.    Pt required multimodal cuing for proper technique and to facilitate improved neuromuscular control, strength, range of motion, and functional ability resulting in improved performance and form.  Traction ***   PATIENT EDUCATION:  Education details: Exercise purpose/form. Self management techniques. Person educated: Patient Education method: Explanation, Demonstration Education comprehension: verbalized understanding and needs further education  HOME EXERCISE PROGRAM: Access Code: [facility] URL: [email or web address] Date: 09/12/2023 Prepared by: Camie Cleverly  Exercises - Sidelying Open Book  - 2 x daily - 10 breaths l side, 20 breaths r side (stiffer side) hold - Cervical SNAG Rotation  - 3 x weekly - 2 sets - 12 reps - Standing Isometric Cervical Retraction with Chin Tucks and Ball at [employer]  - 3 x weekly - 3 sets - 12 reps - Supine Chest Press with Dumbbells  - 3 x weekly - 3 sets - 12 reps - [place] on Foam Roll  - 3 x weekly - 3 sets - 12 reps - Sidelying Shoulder ER with Towel and Dumbbell  - 3 x weekly - 3 sets - 12 reps - Seated Single Arm Shoulder Scaption  - 3 x weekly - 3 sets - 12 reps - Lat Pull Down - Cable/Bar  - 3 x weekly - 3 sets - 12 reps - Row with band/cable  - 3 x weekly - 3 sets - 12 reps - Standing Shoulder Internal Rotation with Anchored Resistance  - 3 x weekly - 3 sets - 12 reps  ASSESSMENT:  CLINICAL IMPRESSION: Today's session focused on providing patient with education and trial of a home set up for traction to help him get the  relief he feels from traction a home. Patient appeared to understand how to use the equipment and reported feeling a good pull similar to the mechanical traction machine. Also worked on improving motion in the upper thoracic spine, which continues to be stiff. Patient reported feeling the same when he left as when he arrived. HEP updated to have less overhead arm movement and to include exercise for upper thoracic spine mobility. Patient has not been doing any HEP. Patient would benefit from continued management of limiting condition by skilled physical therapist to address remaining impairments and functional limitations to work towards stated goals and return to PLOF or maximal functional independence.   OBJECTIVE IMPAIRMENTS: decreased activity tolerance, decreased endurance, decreased knowledge of condition, decreased mobility, joint stiffness, joint instability, decreased ROM, decreased strength, hypomobility, impaired perceived functional ability, increased muscle spasms, impaired flexibility, impaired UE functional use, postural dysfunction, and pain.   ACTIVITY LIMITATIONS: carrying, lifting, bending, sitting, standing, squatting, sleeping, transfers, bed mobility, bathing, dressing, hygiene/grooming, reaching overhead, stabilizing with B UE, locomotion level, and caring for others  PARTICIPATION LIMITATIONS: meal prep, cleaning, laundry, interpersonal relationship, driving, shopping, community activity, yard work, and  Advertising account executive, working on projects, working on [employer], walking for exercise, sleeping, working in his shop, house maintenance and projects, doing as much as he wants to, regular exercise  PERSONAL FACTORS: Age, Past/current experiences, Time since onset  of injury/illness/exacerbation, behavior,  and 3+ comorbidities: depression, bilateral shoulder surgery (RTC repair, R most recent 12/2020), bilateral TKA, subarachnoid hemorrhage 2017 with SAH coiling, HTN, hx of  hepatitis C (cured), double hernia repair, plantar facial fibromatosis, chronic pain syndrome, former smoker, Chronic bilateral low back pain with bilateral sciatica; Acute bursitis of right shoulder; Lumbar facet arthropathy; Lumbar degenerative disc disease; Localized osteoarthritis of shoulder regions, bilateral; Chronic SI joint pain; Chronic pain syndrome; Pain in joint of left shoulder; Abnormal gait; Anatomical narrow angle; Biomechanical lesion; Cataract nuclear; Derangement of posterior horn of medial meniscus; Edema; Glaucoma suspect of both eyes; Hip pain; History of total knee arthroplasty; Localized, primary osteoarthritis; Hx of subarachnoid hemophage, Muscle weakness; Arthritis; Plantar fascial fibromatosis; Viral hepatitis C; Arthropathy of left wrist; Chronic pain of left heel; Intercostal neuralgia; and Closed fracture of rib of left side with routine healing,  has a past medical history of Arthritis, Hepatitis, Hypertension, Subarachnoid hemorrhage (HCC) (2017), and Wears hearing aid in both ears.  has a past surgical history that includes Brain surgery; Joint replacement (Bilateral); Shoulder arthroscopy with rotator cuff repair and open biceps tenodesis (Left, 09/08/2018); and Hernia repair (1963). Also had surgery on right shoulder are also affecting patient's functional outcome.   REHAB POTENTIAL: Good   CLINICAL DECISION MAKING: Evolving/moderate complexity  EVALUATION COMPLEXITY: Moderate   GOALS: Goals reviewed with patient? No  SHORT TERM GOALS: Target date: 08/06/2023  Patient will be independent with initial home exercise program for self-management of symptoms. Baseline: Continue with HEP from last episode of care (07/23/23); Goal status: In progress  LONG TERM GOALS: Target date: 10/15/2023  Patient will be independent with a long-term home exercise program for self-management of symptoms.  Baseline: Continue with past HEP (07/23/23); participating irregularly Goal  status: In progress  2.  Patient will demonstrate improved Upper Extremity Functional Scale (UEFS) to equal or greater than 64/80 to demonstrate improvement in overall condition and self-reported functional ability.  Baseline: to be measured at visit 2 as appropriate (07/23/23); not captured (08/29/23);  Goal status: ongoing  3.  Patient will successfully participate in a exercise program for health at least 3 days a week without exacerbation of symptoms.  Baseline: not participating and difficulty participating due to symptom exacerbation (07/23/23); participating irregularly (08/29/2023);  Goal status: In progress  4.  R shoulder strength will improve at least 4+/5 or more without increased pain to improve his ability to work on projects, home maintenance, and a fitness program.  Baseline: weak and painful - see objective (07/23/23); less painful and at least 1/2 MMT stronger - updated goal to more functional 4+/5 <<T (08/29/2023) Goal status: In progress  5.  Patient will demonstrate improvement in Patient Specific Functional Scale (PSFS) of equal or greater than 8/10 points for all three body parts to reflect clinically significant improvement in patient's most valued functional activities. Baseline: to be measured at visit 2 as appropriate (07/23/23); 8/10 (08/29/23);  Goal status: MET  6.  Patient will report NPRS equal or less than 3/10 during functional activities during the last 2 weeks to improve their abilitly to complete community, work and/or recreational activities with less limitation. Baseline: to be measured visit 2 as appropriate (07/23/23); 6/10 (08/29/2023);  Goal status: In progress  PLAN: PT FREQUENCY: 1-2x/week PT DURATION: 8-12 weeks PLANNED INTERVENTIONS: 97164- PT Re-evaluation, 97750- Physical Performance Testing, 97110-Therapeutic exercises, 97530- Therapeutic activity, [id number]- Neuromuscular re-education, 97535- Self Care, 02859- Manual therapy, H9716- Electrical  stimulation (unattended), 02987- Traction (mechanical), [id number] (1-2 muscles),  79438 (3+ muscles)- Dry Needling, Patient/Family education, Joint mobilization, Spinal mobilization, Cryotherapy, and Moist heat.  PLAN FOR NEXT SESSION: update HEP as appropriate, graded/progressive postural/UE/functional strengthening, core strengthening, manual therapy as needed, mechanical traction. Progressive R shoulder girdle and functional strengthening as tolerated.    Camie SAUNDERS. Juli, PT, DPT, Cert. MDT 09/17/23, 9:08 PM  [facility] [address] [place], [place] [address] P: [phone] I F: [phone]

## 2023-09-19 ENCOUNTER — Telehealth: Payer: Self-pay | Admitting: Physical Therapy

## 2023-09-19 ENCOUNTER — Ambulatory Visit: Admitting: Physical Therapy

## 2023-09-19 NOTE — Telephone Encounter (Addendum)
 LVM notifying patient of missed PT visit scheduled at 9:45am today. Requested they call back to reschedule, confirm next appointment, or let us  know of any changes in PT plans. Let patient know that with any no-show I am required to review our cancellation policy that after 2 no-shows we remove future visits from the schedule, they are responsible to calling in to reschedule, and they will only be able to schedule one appointment at a time and/or we may remove a patient from the schedule.   Let him know I currently have an opening at 6:15pm today that he can have if he wants as long as it is still available when he calls back.   Camie SAUNDERS. Juli, PT, DPT 09/19/23, 10:08 AM  [facility] [address] [place], [place] [address] P: [phone] I F: [phone]

## 2023-09-24 ENCOUNTER — Ambulatory Visit: Admitting: Physical Therapy

## 2023-09-26 ENCOUNTER — Ambulatory Visit: Admitting: Physical Therapy

## 2023-11-16 ENCOUNTER — Encounter: Payer: Self-pay | Admitting: Student in an Organized Health Care Education/Training Program

## 2023-11-18 ENCOUNTER — Other Ambulatory Visit: Payer: Self-pay

## 2023-11-18 MED ORDER — METHOCARBAMOL 500 MG PO TABS: 500.0000 mg | ORAL_TABLET | Freq: Two times a day (BID) | ORAL | 2 refills | Status: AC | PRN

## 2023-12-09 ENCOUNTER — Encounter: Payer: Self-pay | Admitting: Nurse Practitioner

## 2023-12-09 ENCOUNTER — Ambulatory Visit: Attending: Nurse Practitioner | Admitting: Nurse Practitioner

## 2023-12-09 DIAGNOSIS — S2232XD Fracture of one rib, left side, subsequent encounter for fracture with routine healing: Secondary | ICD-10-CM | POA: Insufficient documentation

## 2023-12-09 DIAGNOSIS — Z79891 Long term (current) use of opiate analgesic: Secondary | ICD-10-CM | POA: Insufficient documentation

## 2023-12-09 DIAGNOSIS — M542 Cervicalgia: Secondary | ICD-10-CM | POA: Diagnosis present

## 2023-12-09 DIAGNOSIS — M533 Sacrococcygeal disorders, not elsewhere classified: Secondary | ICD-10-CM | POA: Diagnosis present

## 2023-12-09 DIAGNOSIS — G8929 Other chronic pain: Secondary | ICD-10-CM | POA: Diagnosis present

## 2023-12-09 DIAGNOSIS — Z79899 Other long term (current) drug therapy: Secondary | ICD-10-CM | POA: Diagnosis present

## 2023-12-09 DIAGNOSIS — G894 Chronic pain syndrome: Secondary | ICD-10-CM | POA: Insufficient documentation

## 2023-12-09 DIAGNOSIS — M47816 Spondylosis without myelopathy or radiculopathy, lumbar region: Secondary | ICD-10-CM | POA: Diagnosis not present

## 2023-12-09 MED ORDER — OXYCODONE-ACETAMINOPHEN 7.5-325 MG PO TABS
1.0000 | ORAL_TABLET | Freq: Three times a day (TID) | ORAL | 0 refills | Status: AC | PRN
Start: 2024-01-21 — End: 2024-02-20

## 2023-12-09 MED ORDER — OXYCODONE-ACETAMINOPHEN 7.5-325 MG PO TABS: 1.0000 | ORAL_TABLET | Freq: Three times a day (TID) | ORAL | 0 refills | Status: AC | PRN

## 2023-12-09 MED ORDER — OXYCODONE-ACETAMINOPHEN 7.5-325 MG PO TABS: 1.0000 | ORAL_TABLET | Freq: Three times a day (TID) | ORAL | 0 refills | Status: DC | PRN

## 2023-12-09 MED ORDER — NALOXONE HCL 4 MG/0.1ML NA LIQD: 1.0000 | NASAL | 1 refills | Status: AC | PRN

## 2023-12-09 NOTE — Progress Notes (Signed)
 Nursing Pain Medication Assessment:  Safety precautions to be maintained throughout the outpatient stay will include: orient to surroundings, keep bed in low position, maintain call bell within reach at all times, provide assistance with transfer out of bed and ambulation.  Medication Inspection Compliance: Pill count conducted under aseptic conditions, in front of the patient. Neither the pills nor the bottle was removed from the patient's sight at any time. Once count was completed pills were immediately returned to the patient in their original bottle.  Medication: Oxycodone /APAP Pill/Patch Count: 38 of 90 pills/patches remain Pill/Patch Appearance: Markings consistent with prescribed medication Bottle Appearance: Standard pharmacy container. Clearly labeled. Filled Date: 10 / 03 / 2025 Last Medication intake:  Today

## 2023-12-09 NOTE — Progress Notes (Signed)
 PROVIDER NOTE: Interpretation of information contained herein should be left to medically-trained personnel. Specific patient instructions are provided elsewhere under Patient Instructions section of medical record. This document was created in part using AI and STT-dictation technology, any transcriptional errors that may result from this process are unintentional.  Patient: Charles Mooney  Service: E/M   PCP: Sherial Bail, MD  DOB: 1955/04/20  DOS: 12/09/2023  Provider: Emmy MARLA Blanch, NP  MRN: [id number]  Delivery: Face-to-face  Specialty: Interventional Pain Management  Type: Established Patient  Setting: Ambulatory outpatient facility  Specialty designation: 09  Referring Prov.: Sherial Bail, MD  Location: Outpatient office facility       History of present illness (HPI) [name], a 68 y.o. year old male, is here today because of his back pain and right shoulder pain. Charles Mooney's primary complain today is Back Pain  Pertinent problems: Charles Mooney  has Chronic bilateral low back pain with bilateral sciatica; Acute bursitis of right shoulder; Nonallopathic lesion of sacral region; Lumbar facet arthropathy; Lumbar degenerative disc disease; Localized osteoarthritis of shoulder regions, Bilateral; Encounter for long-term opioid analgesics use; Chronic SI joint pain; and Chronic pain syndrome on their pertinent problem list.  Pain Assessment: Severity of Chronic pain is reported as a 5 /10. Location: Back Lower/Radiates down left leg to left knee. Onset: More than a month ago. Quality: Dull, Aching. Timing: Constant. Modifying factor(s): Denies. Vitals:  height is 6' (1.829 m) and weight is 225 lb (102.1 kg). His temporal temperature is 97.1 F (36.2 C) (abnormal). His blood pressure is 150/91 (abnormal) and his pulse is 72. His respiration is 20 and oxygen saturation is 100%.  BMI: Estimated body mass index is 30.52 kg/m as calculated from the following:   Height as of this  encounter: 6' (1.829 m).   Weight as of this encounter: 225 lb (102.1 kg).  Last encounter: 09/11/2023. Last procedure: Visit date not found.  Reason for encounter: medication management. No change in medical history since last visit.  Patient's pain is at baseline.  Patient continues multimodal pain regimen as prescribed.  States that it provides pain relief and improvement in functional status.   Discussed the use of AI scribe software for clinical note transcription with the patient, who gave verbal consent to proceed.  History of Present Illness   Charles Mooney is a 68 year old male who presents for pain management follow-up.  The patient continues to experience chronic right shoulder pain despite undergoing physical therapy, although there is some improvement in the range of motion and stability due to physical therapy.  He also has experienced a significant worsening of neck pain and chronic low back pain. He states that current pain medication regimen manageable for functional mobility without ant side effects or adverse reaction.    He experiences persistent pain primarily in his hands, wrists, back, and shoulders. He has previously received trigger point injections for right shoulder pain, the last of which was approximately ten months ago. An MRI of the shoulder was performed in 2022. He manages his pain with medication, currently taking oxycodone /acetaminophen  (Percocet) 7.5-325 mg without side effects or adverse reactions. 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  For sleep disturbances, he takes trazodone 100 mg at bedtime, prescribed by his primary care provider. He has tried melatonin in the past without success but finds that magnesium  helps. He takes magnesium  over the counter at a dose of 250 mg, preferring this to  the 500 mg dose, and spaces out the intake of trazodone and magnesium  by about an hour.     Pharmacotherapy Assessment   Analgesic:  Oxycodone -acetaminophen  (Percocet) 7.5-325 mg tablet every 8 hours as needed for pain. MME=33 Monitoring: [place] PMP: PDMP reviewed during this encounter.       Pharmacotherapy: No side-effects or adverse reactions reported. Compliance: No problems identified. Effectiveness: Clinically acceptable.  Charles Mooney, [place]  12/09/2023  8:55 AM  Sign when Signing Visit Nursing Pain Medication Assessment:  Safety precautions to be maintained throughout the outpatient stay will include: orient to surroundings, keep bed in low position, maintain call bell within reach at all times, provide assistance with transfer out of bed and ambulation.  Medication Inspection Compliance: Pill count conducted under aseptic conditions, in front of the patient. Neither the pills nor the bottle was removed from the patient's sight at any time. Once count was completed pills were immediately returned to the patient in their original bottle.  Medication: Oxycodone /APAP Pill/Patch Count: 38 of 90 pills/patches remain Pill/Patch Appearance: Markings consistent with prescribed medication Bottle Appearance: Standard pharmacy container. Clearly labeled. Filled Date: 10 / 03 / 2025 Last Medication intake:  Today    UDS:  Summary  Date Value Ref Range Status  08/14/2023 FINAL  Final    Comment:    ==================================================================== [employer] [facility] 13 (MW) ==================================================================== Test                             Result       Flag       Units  Drug Present and Declared for Prescription Verification   Oxycodone                       485          EXPECTED   ng/mg creat   Oxymorphone                    815          EXPECTED   ng/mg creat   Noroxycodone                   3271         EXPECTED   ng/mg creat   Noroxymorphone                 282          EXPECTED   ng/mg creat    Sources of oxycodone  are scheduled prescription medications.     Oxymorphone, noroxycodone, and noroxymorphone are expected    metabolites of oxycodone . Oxymorphone is also available as a    scheduled prescription medication.  ==================================================================== Test                      Result    Flag   Units      Ref Range   Creatinine              34               mg/dL      >=79 ==================================================================== Declared Medications:  The flagging and interpretation on this report are based on the  following declared medications.  Unexpected results may arise from  inaccuracies in the declared medications.   **Note: The testing scope of this panel includes these medications:   Oxycodone    **Note: The testing scope of  this panel does not include the  following reported medications:   Acetaminophen   Amlodipine  Cannabidiol  Citalopram  Methocarbamol   Trazodone ==================================================================== For clinical consultation, please call [phone]. ====================================================================     No results found for: CBDTHCR No results found for: D8THCCBX No results found for: D9THCCBX  ROS  Constitutional: Denies any fever or chills Gastrointestinal: No reported hemesis, hematochezia, vomiting, or acute GI distress Musculoskeletal: Low back pain, right shoulder pain, bilateral wrist pain Neurological: No reported episodes of acute onset apraxia, aphasia, dysarthria, agnosia, amnesia, paralysis, loss of coordination, or loss of consciousness  Medication Review  NON FORMULARY, amLODipine, citalopram, methocarbamol , oxyCODONE -acetaminophen , and traZODone  History Review  Allergy: Charles Mooney is allergic to sulfa antibiotics. Drug: Charles Mooney  reports that he does not currently use drugs. Alcohol:  reports current alcohol use of about 18.0 standard drinks of alcohol per week. Tobacco:  reports that he  has been smoking cigarettes. He has never used smokeless tobacco. Social: Charles Mooney  reports that he has been smoking cigarettes. He has never used smokeless tobacco. He reports current alcohol use of about 18.0 standard drinks of alcohol per week. He reports that he does not currently use drugs. Medical:  has a past medical history of Arthritis, Hepatitis, Hypertension, Subarachnoid hemorrhage (HCC) (2017), and Wears hearing aid in both ears. Surgical: Charles Mooney  has a past surgical history that includes Brain surgery; Joint replacement (Bilateral); Shoulder arthroscopy with rotator cuff repair and open biceps tenodesis (Left, 09/08/2018); and Hernia repair (1963). Family: family history is not on file.  Laboratory Chemistry Profile   Renal Lab Results  Component Value Date   BUN 18 09/04/2018   CREATININE 0.81 09/04/2018   GFRAA >60 09/04/2018   GFRNONAA >60 09/04/2018    Hepatic Lab Results  Component Value Date   AST 18 09/04/2018   ALT 20 09/04/2018   ALBUMIN 4.4 09/04/2018   ALKPHOS 39 09/04/2018    Electrolytes Lab Results  Component Value Date   NA 138 09/04/2018   K 3.9 09/04/2018   CL 105 09/04/2018   CALCIUM 9.2 09/04/2018    Bone No results found for: VD25OH, VD125OH2TOT, CI6874NY7, CI7874NY7, 25OHVITD1, 25OHVITD2, 25OHVITD3, TESTOFREE, TESTOSTERONE  Inflammation (CRP: Acute Phase) (ESR: Chronic Phase) No results found for: CRP, ESRSEDRATE, LATICACIDVEN       Note: Above Lab results reviewed.  Recent Imaging Review  DG PAIN CLINIC C-ARM 1-60 MIN NO REPORT Fluoro was used, but no Radiologist interpretation will be provided.  Please refer to NOTES tab for provider progress note. Note: Reviewed        Physical Exam  Vitals: BP (!) 150/91 (BP Location: Right Arm, Patient Position: Sitting, Cuff Size: Normal)   Pulse 72   Temp (!) 97.1 F (36.2 C) (Temporal)   Resp 20   Ht 6' (1.829 m)   Wt 225 lb (102.1 kg)   SpO2 100%   BMI  30.52 kg/m  BMI: Estimated body mass index is 30.52 kg/m as calculated from the following:   Height as of this encounter: 6' (1.829 m).   Weight as of this encounter: 225 lb (102.1 kg). Ideal: Ideal body weight: 77.6 kg (171 lb 1.2 oz) Adjusted ideal body weight: 87.4 kg (192 lb 10.3 oz) General appearance: Well nourished, well developed, and well hydrated. In no apparent acute distress Mental status: Alert, oriented x 3 (person, place, & time)       Respiratory: No evidence of acute respiratory distress Eyes: PERLA  Musculoskeletal: +LBP,  right shoulder pain, bilateral wrist pain  Upper Extremity (UE) Exam      Right  Left  Inspection    Skin color, temperature, and hair growth are WNL. No peripheral edema or cyanosis. No masses, redness, swelling, asymmetry, or associated skin lesions. No contractures.  Skin color, temperature, and hair growth are WNL. No peripheral edema or cyanosis. No masses, redness, swelling, asymmetry, or associated skin lesions. No contractures.          Functional ROM    Pain restricted ROM for shoulder  Pain restricted ROM                  Muscle Tone/Strength    Functionally intact. No obvious neuro-muscular anomalies detected.  Functionally intact. No obvious neuro-muscular anomalies detected.          Sensory (Neurological)    Musculoskeletal pain pattern affecting the shoulder  Musculoskeletal pain pattern                  Palpation    No palpable anomalies              No palpable anomalies                      Maneuver Shoulder abduction (deltoid/supraspinatus, axillary/supra scapular n,, C5) Elbow flexion (biceps brachial, musculoskeletal n, C5-6) Elbow extension (triceps, radial n, C7) Finger abduction (interossei, ulnar n, T1)    Shoulder abduction (deltoid/supraspinatus, axillary/supra scapular n,, C5) Elbow flexion (biceps brachial, musculoskeletal n, C5-6) Elbow extension (triceps, radial n, C7) Wrist extensors (C6) Finger extensors  (C8) Finger abduction (interossei, ulnar n, T1)            Provocative Test    Phalen's test: deferred Tinel's test: deferred Apley's scratch test (touch opposite shoulder):  Action 1 (Across chest): Decreased ROM Action 2 (Overhead): Decreased ROM Action 3 (LB reach): Decreased ROM  Phalen's test: deferred Tinel's test: deferred Apley's scratch test (touch opposite shoulder):  Action 1 (Across chest): deferred Action 2 (Overhead): deferred Action 3 (LB reach): deferred             Level  Myotome  Dermatome  Sclerotome  ROM  C5  Elbow flexion  Lateral upper arm      C6  Wrist extension  Thumb and index      C7  Elbow extension  Middle finger      C8  Finger extension  Ring and pinky finger      T1  Finger abduction  Medial elbow and axilla                                                                                                                                         Assessment   Diagnosis Status  1. Chronic SI joint pain   2. Medication management   3. Chronic pain syndrome   4. Cervicalgia  5. Encounter for long-term opiate analgesic use   6. Lumbar facet arthropathy   7. Closed fracture of one rib of left side with routine healing, subsequent encounter    Controlled Controlled Controlled   Updated Problems: Problem  Medication Management    Plan of Care  Problem-specific:  Assessment and Plan    Chronic pain syndrome with opioid management Chronic pain managed with oxycodone . No side effects. Pain in hands, wrists, back, shoulder. Satisfied with current pain management. - Continue oxycodone /acetaminophen  as prescribed. - Refer to new pain management clinic upon receiving address when it is available Patient's pain is well-controlled with oxycodone , will continue on current medication regimen.  Prescribing drug monitoring (PDMP) reviewed, findings consistent with the use of prescribed medication no evidence of narcotic misuse or abuse.  Urine  drug screening (UDS) reported and consistent with the use of prescribed medication.  No adverse reaction or side effects reported from medication.  Schedule follow-up in 90 days for medication management.  Arthritis of hands and wrists Arthritis in hands and wrists contributing to chronic pain. Patient's pain is well-controlled with oxycodone , will continue on current medication regimen.  Prescribing drug monitoring (PDMP) reviewed, findings consistent with the use of prescribed medication no evidence of narcotic misuse or abuse.  Right shoulder pain due to arthritis and myofascial pain syndrome Right shoulder pain from arthritis and myofascial pain syndrome. MRI in 2022. - Advise to report worsening pain or desire for intervention. - Consider x-ray and injection if pain becomes localized and persistent.  Lumbar spondylosis Chronic back pain likely related to lumbar spondylosis. Patient's pain is well-controlled with oxycodone , will continue on current medication regimen.  Prescribing drug monitoring (PDMP) reviewed, findings consistent with the use of prescribed medication no evidence of narcotic misuse or abuse.  Follow-Up - Schedule follow-up in three months. - Advise to communicate changes in condition or need for intervention.        Charles Mooney has a current medication list which includes the following long-term medication(s): amlodipine and citalopram.  Pharmacotherapy (Medications Ordered): Meds ordered this encounter  Medications   oxyCODONE -acetaminophen  (PERCOCET) 7.5-325 MG tablet    Sig: Take 1 tablet by mouth every 8 (eight) hours as needed for moderate pain (pain score 4-6) or severe pain (pain score 7-10). Must last 30 days.    Dispense:  90 tablet    Refill:  0    Chronic Pain: STOP Act (Not applicable) Fill 1 day early if closed on refill date. Avoid benzodiazepines within 8 hours of opioids   oxyCODONE -acetaminophen  (PERCOCET) 7.5-325 MG tablet    Sig: Take  1 tablet by mouth every 8 (eight) hours as needed for moderate pain (pain score 4-6) or severe pain (pain score 7-10). Must last 30 days.    Dispense:  90 tablet    Refill:  0    Chronic Pain: STOP Act (Not applicable) Fill 1 day early if closed on refill date. Avoid benzodiazepines within 8 hours of opioids   oxyCODONE -acetaminophen  (PERCOCET) 7.5-325 MG tablet    Sig: Take 1 tablet by mouth every 8 (eight) hours as needed for moderate pain (pain score 4-6) or severe pain (pain score 7-10). Must last 30 days.    Dispense:  90 tablet    Refill:  0    Chronic Pain: STOP Act (Not applicable) Fill 1 day early if closed on refill date. Avoid benzodiazepines within 8 hours of opioids   Orders:  No orders of the defined types were placed  in this encounter.     Return in about 3 months (around 03/10/2024) for (F2F), (MM), Emmy Blanch NP.    Recent Visits Date Type Provider Dept  09/11/23 Office Visit Chaniah Cisse K, NP [facility]  Showing recent visits within past 90 days and meeting all other requirements Today's Visits Date Type Provider Dept  12/09/23 Office Visit Linford Quintela K, NP [facility]  Showing today's visits and meeting all other requirements Future Appointments No visits were found meeting these conditions. Showing future appointments within next 90 days and meeting all other requirements  I discussed the assessment and treatment plan with the patient. The patient was provided an opportunity to ask questions and all were answered. The patient agreed with the plan and demonstrated an understanding of the instructions.  Patient advised to call back or seek an in-person evaluation if the symptoms or condition worsens.  I personally spent a total of 30 minutes in the care of the patient today including preparing to see the patient, getting/reviewing separately obtained history, performing a medically appropriate exam/evaluation, counseling and educating,  placing orders, referring and communicating with other health care professionals, documenting clinical information in the EHR, independently interpreting results, communicating results, and coordinating care.   Note by: Jewelle Whitner K Shaquinta Peruski, NP (TTS and AI technology used. I apologize for any typographical errors that were not detected and corrected.) Date: 12/09/2023; Time: 9:26 AM

## 2024-03-09 NOTE — Progress Notes (Unsigned)
 PROVIDER NOTE: Interpretation of information contained herein should be left to medically-trained personnel. Specific patient instructions are provided elsewhere under Patient Instructions section of medical record. This document was created in part using AI and STT-dictation technology, any transcriptional errors that may result from this process are unintentional.  Patient: Charles Mooney  Service: E/M   PCP: Sherial Bail, MD  DOB: January 11, 1956  DOS: 03/10/2024  Provider: Emmy MARLA Blanch, NP  MRN: [id number]  Delivery: Face-to-face  Specialty: Interventional Pain Management  Type: Established Patient  Setting: Ambulatory outpatient facility  Specialty designation: 09  Referring Prov.: Sherial Bail, MD  Location: Outpatient office facility       History of present illness (HPI) [name], a 69 y.o. year old male, is here today because of his No primary diagnosis found.. [name]'s primary complain today is No chief complaint on file.  Pertinent problems: [name] has Chronic bilateral low back pain with bilateral sciatica; Acute bursitis of right shoulder; Lumbar facet arthropathy; Lumbar degenerative disc disease; Localized osteoarthritis of shoulder regions, bilateral; Medication management; Chronic SI joint pain; Chronic pain syndrome; Pain in joint of left shoulder; History of total knee arthroplasty; Localized, primary osteoarthritis; Plantar fascial fibromatosis; Arthropathy of left wrist; Chronic pain of left heel; Intercostal neuralgia; and Closed fracture of rib of left side with routine healing on their pertinent problem list.  Pain Assessment: Severity of   is reported as a  /10. Location:    / . Onset:  . Quality:  . Timing:  . Modifying factor(s):  SABRA Vitals:  vitals were not taken for this visit.  BMI: Estimated body mass index is 30.52 kg/m as calculated from the following:   Height as of 12/09/23: 6' (1.829 m).   Weight as of 12/09/23: 225 lb (102.1  kg).  Last encounter: 12/09/2023. Last procedure: Visit date not found.  Reason for encounter:  *** .   Discussed the use of AI scribe software for clinical note transcription with the patient, who gave verbal consent to proceed.  History of Present Illness           Pharmacotherapy Assessment   Analgesic: Oxycodone -acetaminophen  (Percocet) 7.5-325 mg tablet every 8 hours as needed for pain. MME=33 Monitoring: [employer] PMP: PDMP reviewed during this encounter.       Pharmacotherapy: No side-effects or adverse reactions reported. Compliance: No problems identified. Effectiveness: Clinically acceptable.  No notes on file  UDS:  Summary  Date Value Ref Range Status  08/14/2023 FINAL  Final    Comment:    ==================================================================== [employer] [facility] [employer] (MW) ==================================================================== Test                             Result       Flag       Units  Drug Present and Declared for Prescription Verification   Oxycodone                       485          EXPECTED   ng/mg creat   Oxymorphone                    815          EXPECTED   ng/mg creat   Noroxycodone                   3271  EXPECTED   ng/mg creat   Noroxymorphone                 282          EXPECTED   ng/mg creat    Sources of oxycodone  are scheduled prescription medications.    Oxymorphone, noroxycodone, and noroxymorphone are expected    metabolites of oxycodone . Oxymorphone is also available as a    scheduled prescription medication.  ==================================================================== Test                      Result    Flag   Units      Ref Range   Creatinine              34               mg/dL      >=79 ==================================================================== Declared Medications:  The flagging and interpretation on this report are based on the  following declared medications.  Unexpected results may  arise from  inaccuracies in the declared medications.   **Note: The testing scope of this panel includes these medications:   Oxycodone    **Note: The testing scope of this panel does not include the  following reported medications:   Acetaminophen   Amlodipine  Cannabidiol  Citalopram  Methocarbamol   Trazodone ==================================================================== For clinical consultation, please call [phone]. ====================================================================     No results found for: CBDTHCR No results found for: D8THCCBX No results found for: D9THCCBX  ROS  Constitutional: Denies any fever or chills Gastrointestinal: No reported hemesis, hematochezia, vomiting, or acute GI distress Musculoskeletal: Denies any acute onset joint swelling, redness, loss of ROM, or weakness Neurological: No reported episodes of acute onset apraxia, aphasia, dysarthria, agnosia, amnesia, paralysis, loss of coordination, or loss of consciousness  Medication Review  NON FORMULARY, amLODipine, citalopram, methocarbamol , naloxone , oxyCODONE -acetaminophen , and traZODone  History Review  Allergy: [name] is allergic to sulfa antibiotics. Drug: [name]  reports that he does not currently use drugs. Alcohol:  reports current alcohol use of about 18.0 standard drinks of alcohol per week. Tobacco:  reports that he has been smoking cigarettes. He has been smoking an average of 0.2 packs per day. He has never used smokeless tobacco. Social: Mr. Flight  reports that he has been smoking cigarettes. He has been smoking an average of 0.2 packs per day. He has never used smokeless tobacco. He reports current alcohol use of about 18.0 standard drinks of alcohol per week. He reports that he does not currently use drugs. Medical:  has a past medical history of Arthritis, Hepatitis, Hypertension, Subarachnoid hemorrhage (HCC) (2017), and Wears hearing aid in  both ears. Surgical: [name]  has a past surgical history that includes Brain surgery; Joint replacement (Bilateral); Shoulder arthroscopy with rotator cuff repair and open biceps tenodesis (Left, 09/08/2018); and Hernia repair (1963). Family: family history is not on file.  Laboratory Chemistry Profile   Renal Lab Results  Component Value Date   BUN 18 09/04/2018   CREATININE 0.81 09/04/2018   GFRAA >60 09/04/2018   GFRNONAA >60 09/04/2018    Hepatic Lab Results  Component Value Date   AST 18 09/04/2018   ALT 20 09/04/2018   ALBUMIN 4.4 09/04/2018   ALKPHOS 39 09/04/2018    Electrolytes Lab Results  Component Value Date   NA 138 09/04/2018   K 3.9 09/04/2018   CL 105 09/04/2018   CALCIUM 9.2 09/04/2018    Bone No results  found for: VD25OH, [id number], [id number], [id number], 25OHVITD1, 25OHVITD2, 25OHVITD3, TESTOFREE, TESTOSTERONE  Inflammation (CRP: Acute Phase) (ESR: Chronic Phase) No results found for: CRP, ESRSEDRATE, LATICACIDVEN       Note: Above Lab results reviewed.  Recent Imaging Review  DG PAIN CLINIC C-ARM 1-60 MIN NO REPORT Fluoro was used, but no Radiologist interpretation will be provided.  Please refer to NOTES tab for provider progress note. Note: Reviewed        Physical Exam  Vitals: There were no vitals taken for this visit. BMI: Estimated body mass index is 30.52 kg/m as calculated from the following:   Height as of 12/09/23: 6' (1.829 m).   Weight as of 12/09/23: 225 lb (102.1 kg). Ideal: Patient weight not recorded General appearance: Well nourished, well developed, and well hydrated. In no apparent acute distress Mental status: Alert, oriented x 3 (person, place, & time)       Respiratory: No evidence of acute respiratory distress Eyes: PERLA   Assessment   Diagnosis Status  1. Medication management   2. Chronic pain syndrome   3. Cervicalgia   4. Encounter for long-term opiate analgesic use   5.  Lumbar facet arthropathy   6. Chronic SI joint pain   7. Closed fracture of one rib of left side with routine healing, subsequent encounter    Controlled Controlled Controlled   Updated Problems: No problems updated.  Plan of Care  Problem-specific:  Assessment and Plan            Mr. DANYAL WHITENACK has a current medication list which includes the following long-term medication(s): amlodipine and citalopram.  Pharmacotherapy (Medications Ordered): No orders of the defined types were placed in this encounter.  Orders:  No orders of the defined types were placed in this encounter.    {There is no content from the last Plan section.}   No follow-ups on file.    Recent Visits No visits were found meeting these conditions. Showing recent visits within past 90 days and meeting all other requirements Future Appointments Date Type Provider Dept  03/10/24 Appointment Peola Joynt K, NP [facility]  Showing future appointments within next 90 days and meeting all other requirements  I discussed the assessment and treatment plan with the patient. The patient was provided an opportunity to ask questions and all were answered. The patient agreed with the plan and demonstrated an understanding of the instructions.  Patient advised to call back or seek an in-person evaluation if the symptoms or condition worsens.  Duration of encounter: *** minutes.  Total time on encounter, as per AMA guidelines included both the face-to-face and non-face-to-face time personally spent by the physician and/or other qualified health care professional(s) on the day of the encounter (includes time in activities that require the physician or other qualified health care professional and does not include time in activities normally performed by clinical staff). Physician's time may include the following activities when performed: Preparing to see the patient (e.g., pre-charting review of records,  searching for previously ordered imaging, lab work, and nerve conduction tests) Review of prior analgesic pharmacotherapies. Reviewing PMP Interpreting ordered tests (e.g., lab work, imaging, nerve conduction tests) Performing post-procedure evaluations, including interpretation of diagnostic procedures Obtaining and/or reviewing separately obtained history Performing a medically appropriate examination and/or evaluation Counseling and educating the patient/family/caregiver Ordering medications, tests, or procedures Referring and communicating with other health care professionals (when not separately reported) Documenting clinical information in the electronic or other health record Independently  interpreting results (not separately reported) and communicating results to the patient/ family/caregiver Care coordination (not separately reported)  Note by: Hernandez Losasso K Michie Molnar, NP (TTS and AI technology used. I apologize for any typographical errors that were not detected and corrected.) Date: 03/10/2024; Time: 2:26 PM

## 2024-03-10 ENCOUNTER — Ambulatory Visit: Attending: Nurse Practitioner | Admitting: Nurse Practitioner

## 2024-03-10 ENCOUNTER — Encounter: Payer: Self-pay | Admitting: Nurse Practitioner

## 2024-03-10 VITALS — BP 154/86 | HR 82 | Temp 97.0°F | Resp 20 | Ht 72.0 in | Wt 225.0 lb

## 2024-03-10 DIAGNOSIS — G8929 Other chronic pain: Secondary | ICD-10-CM | POA: Diagnosis present

## 2024-03-10 DIAGNOSIS — G894 Chronic pain syndrome: Secondary | ICD-10-CM | POA: Insufficient documentation

## 2024-03-10 DIAGNOSIS — M47816 Spondylosis without myelopathy or radiculopathy, lumbar region: Secondary | ICD-10-CM | POA: Insufficient documentation

## 2024-03-10 DIAGNOSIS — Z79891 Long term (current) use of opiate analgesic: Secondary | ICD-10-CM | POA: Diagnosis not present

## 2024-03-10 DIAGNOSIS — M542 Cervicalgia: Secondary | ICD-10-CM | POA: Diagnosis present

## 2024-03-10 DIAGNOSIS — M533 Sacrococcygeal disorders, not elsewhere classified: Secondary | ICD-10-CM | POA: Diagnosis not present

## 2024-03-10 DIAGNOSIS — M19032 Primary osteoarthritis, left wrist: Secondary | ICD-10-CM | POA: Insufficient documentation

## 2024-03-10 DIAGNOSIS — S2232XD Fracture of one rib, left side, subsequent encounter for fracture with routine healing: Secondary | ICD-10-CM | POA: Insufficient documentation

## 2024-03-10 DIAGNOSIS — Z79899 Other long term (current) drug therapy: Secondary | ICD-10-CM | POA: Diagnosis present

## 2024-03-10 MED ORDER — OXYCODONE-ACETAMINOPHEN 7.5-325 MG PO TABS: 1.0000 | ORAL_TABLET | Freq: Three times a day (TID) | ORAL | 0 refills | Status: AC | PRN

## 2024-03-10 NOTE — Patient Instructions (Signed)

## 2024-03-10 NOTE — Progress Notes (Signed)
 Nursing Pain Medication Assessment:  Safety precautions to be maintained throughout the outpatient stay will include: orient to surroundings, keep bed in low position, maintain call bell within reach at all times, provide assistance with transfer out of bed and ambulation.  Medication Inspection Compliance: Pill count conducted under aseptic conditions, in front of the patient. Neither the pills nor the bottle was removed from the patient's sight at any time. Once count was completed pills were immediately returned to the patient in their original bottle.  Medication: Oxycodone /APAP Pill/Patch Count: 40 of 90 pills/patches remain Pill/Patch Appearance: Markings consistent with prescribed medication Bottle Appearance: Standard pharmacy container. Clearly labeled. Filled Date: 1 / 2 / 2026 Last Medication intake:  Today

## 2024-03-17 ENCOUNTER — Other Ambulatory Visit: Payer: Self-pay | Admitting: Nurse Practitioner

## 2024-03-17 ENCOUNTER — Encounter: Payer: Self-pay | Admitting: Student in an Organized Health Care Education/Training Program

## 2024-03-17 DIAGNOSIS — M79641 Pain in right hand: Secondary | ICD-10-CM | POA: Insufficient documentation

## 2024-03-17 DIAGNOSIS — M19032 Primary osteoarthritis, left wrist: Secondary | ICD-10-CM

## 2024-03-25 ENCOUNTER — Ambulatory Visit: Admitting: Student in an Organized Health Care Education/Training Program

## 2024-03-25 ENCOUNTER — Ambulatory Visit
Admission: RE | Admit: 2024-03-25 | Discharge: 2024-03-25 | Disposition: A | Source: Ambulatory Visit | Attending: Student in an Organized Health Care Education/Training Program | Admitting: Student in an Organized Health Care Education/Training Program

## 2024-03-25 ENCOUNTER — Encounter: Payer: Self-pay | Admitting: Student in an Organized Health Care Education/Training Program

## 2024-03-25 VITALS — BP 153/94 | HR 84 | Resp 20 | Ht 72.0 in | Wt 228.0 lb

## 2024-03-25 DIAGNOSIS — M5416 Radiculopathy, lumbar region: Secondary | ICD-10-CM

## 2024-03-25 DIAGNOSIS — M19032 Primary osteoarthritis, left wrist: Secondary | ICD-10-CM

## 2024-03-25 MED ORDER — DEXAMETHASONE SOD PHOSPHATE PF 10 MG/ML IJ SOLN
10.0000 mg | Freq: Once | INTRAMUSCULAR | Status: AC
  Administered 2024-03-25: 10 mg
  Filled 2024-03-25: qty 1

## 2024-03-25 MED ORDER — LIDOCAINE HCL 2 % IJ SOLN
20.0000 mL | Freq: Once | INTRAMUSCULAR | Status: AC
  Administered 2024-03-25: 400 mg
  Filled 2024-03-25: qty 20

## 2024-03-25 MED ORDER — ROPIVACAINE HCL 2 MG/ML IJ SOLN
INTRAMUSCULAR | Status: AC
  Filled 2024-03-25: qty 20

## 2024-03-25 NOTE — Patient Instructions (Signed)

## 2024-03-25 NOTE — Progress Notes (Signed)
 Safety precautions to be maintained throughout the outpatient stay will include: orient to surroundings, keep bed in low position, maintain call bell within reach at all times, provide assistance with transfer out of bed and ambulation.

## 2024-03-25 NOTE — Progress Notes (Signed)
 PROVIDER NOTE: Information contained herein reflects review and annotations entered in association with encounter. Interpretation of such information and data should be left to medically-trained personnel. Information provided to patient can be located elsewhere in the medical record under Patient Instructions. Document created using STT-dictation technology, any transcriptional errors that may result from process are unintentional.    Patient: Charles Mooney  Service Category: Procedure  Provider: Wallie Sherry, MD  DOB: 1956/01/13  DOS: 03/25/2024  Location: [facility]  MRN: [id number]  Setting: Ambulatory - outpatient  Referring Provider: Sherial Bail, MD  Type: Established Patient  Specialty: Interventional Pain Management  PCP: Sherial Bail, MD   Primary Reason for Visit: Interventional Pain Management Treatment. CC: Wrist Pain and Back Pain     Procedure:          Anesthesia, Analgesia, Anxiolysis:  Type: Therapeutic Wrist Steroid Injection  #2  Region: Dorsal Interspace between scaphoid and radial head Level: Wrist Laterality: Left  Type: Local Anesthesia Local Anesthetic: Lidocaine  1-2% Sedation: None  Indication(s):  Analgesia Route: Infiltration ([place]ath/IM) IV Access: N/A   Position: Sitting   Indications: 1. Arthropathy of left wrist   2. Lumbar radiculopathy    Pain Score: Pre-procedure: 8  (Left)/10 Post-procedure: 4 /10     Pre-op H&P Assessment:  Charles Mooney is a 69 y.o. (year old), male patient, seen today for interventional treatment. He  has a past surgical history that includes Brain surgery; Joint replacement (Bilateral); Shoulder arthroscopy with rotator cuff repair and open biceps tenodesis (Left, 09/08/2018); and Hernia repair (1963). Charles Mooney has a current medication list which includes the following prescription(s): amlodipine, citalopram, methocarbamol , naloxone , NON FORMULARY, oxycodone -acetaminophen , [START ON 04/21/2024]  oxycodone -acetaminophen , [START ON 05/21/2024] oxycodone -acetaminophen , and trazodone. His primarily concern today is the Wrist Pain and Back Pain   Initial Vital Signs:  Pulse/HCG Rate: 84  Temp:   Resp: 20 BP: (!) 153/94 SpO2: 96 %  BMI: Estimated body mass index is 30.92 kg/m as calculated from the following:   Height as of this encounter: 6' (1.829 m).   Weight as of this encounter: 228 lb (103.4 kg).  Risk Assessment: Allergies: Reviewed. He is allergic to sulfa antibiotics.  Allergy Precautions: None required Coagulopathies: Reviewed. None identified.  Blood-thinner therapy: None at this time Active Infection(s): Reviewed. None identified. [name] is afebrile  Site Confirmation: Charles Mooney was asked to confirm the procedure and laterality before marking the site Procedure checklist: Completed Consent: Before the procedure and under the influence of no sedative(s), amnesic(s), or anxiolytics, the patient was informed of the treatment options, risks and possible complications. To fulfill our ethical and legal obligations, as recommended by the American Medical Association's Code of Ethics, I have informed the patient of my clinical impression; the nature and purpose of the treatment or procedure; the risks, benefits, and possible complications of the intervention; the alternatives, including doing nothing; the risk(s) and benefit(s) of the alternative treatment(s) or procedure(s); and the risk(s) and benefit(s) of doing nothing. The patient was provided information about the general risks and possible complications associated with the procedure. These may include, but are not limited to: failure to achieve desired goals, infection, bleeding, organ or nerve damage, allergic reactions, paralysis, and death. In addition, the patient was informed of those risks and complications associated to the procedure, such as failure to decrease pain; infection; bleeding; organ or nerve damage with  subsequent damage to sensory, motor, and/or autonomic systems, resulting in permanent pain, numbness, and/or weakness of one  or several areas of the body; allergic reactions; (i.e.: anaphylactic reaction); and/or death. Furthermore, the patient was informed of those risks and complications associated with the medications. These include, but are not limited to: allergic reactions (i.e.: anaphylactic or anaphylactoid reaction(s)); adrenal axis suppression; blood sugar elevation that in diabetics may result in ketoacidosis or comma; water retention that in patients with history of congestive heart failure may result in shortness of breath, pulmonary edema, and decompensation with resultant heart failure; weight gain; swelling or edema; medication-induced neural toxicity; particulate matter embolism and blood vessel occlusion with resultant organ, and/or nervous system infarction; and/or aseptic necrosis of one or more joints. Finally, the patient was informed that Medicine is not an exact science; therefore, there is also the possibility of unforeseen or unpredictable risks and/or possible complications that may result in a catastrophic outcome. The patient indicated having understood very clearly. We have given the patient no guarantees and we have made no promises. Enough time was given to the patient to ask questions, all of which were answered to the patient's satisfaction. Charles Mooney has indicated that he wanted to continue with the procedure. Attestation: I, the ordering provider, attest that I have discussed with the patient the benefits, risks, side-effects, alternatives, likelihood of achieving goals, and potential problems during recovery for the procedure that I have provided informed consent. Date  Time: 03/25/2024 12:50 PM  Pre-Procedure Preparation:  Monitoring: As per clinic protocol. Respiration, ETCO2, SpO2, BP, heart rate and rhythm monitor placed and checked for adequate function Safety  Precautions: Patient was assessed for positional comfort and pressure points before starting the procedure. Time-out: I initiated and conducted the Time-out before starting the procedure, as per protocol. The patient was asked to participate by confirming the accuracy of the Time Out information. Verification of the correct person, site, and procedure were performed and confirmed by me, the nursing staff, and the patient. Time-out conducted as per Joint Commission's Universal Protocol (UP.01.01.01). Time: 1334  Description of Procedure:          Target Area: Space between scaphoid and radial head Approach: Dorsal approach. Area Prepped: Entire wrist Region DuraPrep (Iodine Povacrylex [0.7% available iodine] and Isopropyl Alcohol, 74% w/w) Safety Precautions: Aspiration looking for blood return was conducted prior to all injections. At no point did we inject any substances, as a needle was being advanced. No attempts were made at seeking any paresthesias. Safe injection practices and needle disposal techniques used. Medications properly checked for expiration dates. SDV (single dose vial) medications used. Description of the Procedure: Protocol guidelines were followed. The patient was placed in position. The target area was identified and the area prepped in the usual manner. Skin & deeper tissues infiltrated with local anesthetic. Appropriate amount of time allowed to pass for local anesthetics to take effect. The procedure needles were then advanced to the target area. Proper needle placement secured. Negative aspiration confirmed. Solution injected in intermittent fashion, asking for systemic symptoms every 0.5cc of injectate. The needles were then removed and the area cleansed, making sure to leave some of the prepping solution back to take advantage of its long term bactericidal properties.  Vitals:   03/25/24 1258  BP: (!) 153/94  Pulse: 84  Resp: 20  SpO2: 96%  Weight: 228 lb (103.4  kg)  Height: 6' (1.829 m)    Start Time: 1334 hrs. End Time: 1338 hrs. Materials:  Needle(s) Type: Regular needle Gauge: 25G Length: 1.5-in Medication(s): Please see orders for medications and dosing details. 4  cc solution made of 3 cc of 2% lidocaine , 1 cc of Decadron  10 mg/cc.  Injected into the left wrist between the scaphoid and radius under fluoroscopy. Imaging Guidance:          Type of Imaging Technique: Fluoroscopy Guidance (Non-spinal) Indication(s): Assistance in needle guidance and placement for procedures requiring needle placement in or near specific anatomical locations not easily accessible without such assistance. Exposure Time: Please see nurses notes. Contrast: None used. Fluoroscopic Guidance: I was personally present during the use of fluoroscopy. Tunnel Vision Technique used to obtain the best possible view of the target area. Parallax error corrected before commencing the procedure. Direction-depth-direction technique used to introduce the needle under continuous pulsed fluoroscopy. Once target was reached, antero-posterior, oblique, and lateral fluoroscopic projection used confirm needle placement in all planes. Images permanently stored in EMR. Ultrasound Guidance: N/A Interpretation: N/A  Post-operative Assessment:  Post-procedure Vital Signs:  Pulse/HCG Rate: 84  Temp:   Resp: 20 BP: (!) 153/94 SpO2: 96 %  EBL: None  Complications: No immediate post-treatment complications observed by team, or reported by patient.  Note: The patient tolerated the entire procedure well. A repeat set of vitals were taken after the procedure and the patient was kept under observation following institutional policy, for this type of procedure. Post-procedural neurological assessment was performed, showing return to baseline, prior to discharge. The patient was provided with post-procedure discharge instructions, including a section on how to identify potential problems.  Should any problems arise concerning this procedure, the patient was given instructions to immediately contact us , at any time, without hesitation. In any case, we plan to contact the patient by telephone for a follow-up status report regarding this interventional procedure.  Comments:  No additional relevant information.  Plan of Care  Orders:   Patient is endorsing pain from his lower back radiating down his left lateral leg stopping at his knee.  This has been going on for the last week and has been very severe.  It is improved with standing and walking.  Less likely related to hip pathology as it does not worsen with weightbearing.  Will obtain lumbar MRI as the patient is having significant pain and would like to rule out lumbar spinal nerve compression that could be contributing to his symptoms. Orders Placed This Encounter  Procedures   DG PAIN [facility] C-ARM 1-60 MIN NO REPORT    Intraoperative interpretation by procedural physician at [facility].    Standing Status:   Standing    Number of Occurrences:   1    Reason for exam::   Assistance in needle guidance and placement for procedures requiring needle placement in or near specific anatomical locations not easily accessible without such assistance.   MR LUMBAR SPINE WO CONTRAST    Patient presents with axial pain with possible radicular component. Please assist us  in identifying specific level(s) and laterality of any additional findings such as: 1. Facet (Zygapophyseal) joint DJD (Hypertrophy, space narrowing, subchondral sclerosis, and/or osteophyte formation) 2. DDD and/or IVDD (Loss of disc height, desiccation, gas patterns, osteophytes, endplate sclerosis, or Black disc disease) 3. Pars defects 4. Spondylolisthesis, spondylosis, and/or spondyloarthropathies (include Degree/Grade of displacement in mm) (stability) 5. Vertebral body Fractures (acute/chronic) (state percentage of collapse) 6. Demineralization  (osteopenia/osteoporotic) 7. Bone pathology 8. Foraminal narrowing  9. Surgical changes 10. Central, Lateral Recess, and/or Foraminal Stenosis (include AP diameter of stenosis in mm) 11. Surgical changes (hardware type, status, and presence of fibrosis) 12. Modic Type Changes (MRI  only) 13. IVDD (Disc bulge, protrusion, herniation, extrusion) (Level, laterality, extent)    Standing Status:   Future    Expiration Date:   06/22/2024    Scheduling Instructions:     Please make sure that the patient understands that this needs to be done as soon as possible. Never have the patient do the imaging just before the next appointment. Inform patient that having the imaging done within the [facility] will expedite the availability of the results and will provide      imaging availability to the requesting physician. In addition inform the patient that the imaging order has an expiration date and will not be renewed if not done within the active period.    What is the patient's sedation requirement?:   No Sedation    Does the patient have a pacemaker or implanted devices?:   No    Preferred imaging location?:   [facility] (table limit-350lbs)    Call Results- Best Contact Number?:   [phone] [facility]    Radiology Contrast Protocol - do NOT remove file path:   [email or web address]     Medications ordered for procedure: Meds ordered this encounter  Medications   lidocaine  (XYLOCAINE ) 2 % (with pres) injection 400 mg   dexamethasone  (DECADRON ) injection 10 mg   Medications administered: We administered lidocaine  and dexamethasone .  See the medical record for exact dosing, route, and time of administration.  Follow-up plan:   Return for Keep sch. appt.      Recent Visits Date Type Provider Dept  03/10/24 Office Visit Patel, Seema K, NP [facility]  Showing recent visits within past 90 days  and meeting all other requirements Today's Visits Date Type Provider Dept  03/25/24 Procedure visit Marcelino Nurse, MD [facility]  Showing today's visits and meeting all other requirements Future Appointments Date Type Provider Dept  05/06/24 Appointment Patel, Seema K, NP [facility]  Showing future appointments within next 90 days and meeting all other requirements  Disposition: Discharge home  Discharge (Date  Time): 03/25/2024; 1339 hrs.   Primary Care Physician: Sherial Bail, MD Location: [facility] Note by: Nurse Marcelino, MD Date: 03/25/2024; Time: 3:27 PM  Disclaimer:  Medicine is not an exact science. The only guarantee in medicine is that nothing is guaranteed. It is important to note that the decision to proceed with this intervention was based on the information collected from the patient. The Data and conclusions were drawn from the patient's questionnaire, the interview, and the physical examination. Because the information was provided in large part by the patient, it cannot be guaranteed that it has not been purposely or unconsciously manipulated. Every effort has been made to obtain as much relevant data as possible for this evaluation. It is important to note that the conclusions that lead to this procedure are derived in large part from the available data. Always take into account that the treatment will also be dependent on availability of resources and existing treatment guidelines, considered by other Pain Management Practitioners as being common knowledge and practice, at the time of the intervention. For Medico-Legal purposes, it is also important to point out that variation in procedural techniques and pharmacological choices are the acceptable norm. The indications, contraindications, technique, and results of the above procedure should only be interpreted and judged by a Board-Certified Interventional Pain Specialist  with extensive familiarity and expertise in the same exact procedure and technique.

## 2024-03-26 ENCOUNTER — Telehealth: Payer: Self-pay

## 2024-03-26 NOTE — Telephone Encounter (Signed)
 Post procedure follow up. Patient states he is doing very well.

## 2024-03-27 ENCOUNTER — Encounter: Payer: Self-pay | Admitting: Student in an Organized Health Care Education/Training Program

## 2024-04-01 ENCOUNTER — Ambulatory Visit

## 2024-05-06 ENCOUNTER — Encounter: Admitting: Nurse Practitioner
